# Patient Record
Sex: Female | Born: 1975
Health system: Southern US, Community
[De-identification: ages and names within clinical notes are randomized; demographics above are authoritative.]

## PROBLEM LIST (undated history)

## (undated) DIAGNOSIS — E11319 Type 2 diabetes mellitus with unspecified diabetic retinopathy without macular edema: Secondary | ICD-10-CM

## (undated) DIAGNOSIS — R809 Proteinuria, unspecified: Secondary | ICD-10-CM

## (undated) DIAGNOSIS — G4733 Obstructive sleep apnea (adult) (pediatric): Secondary | ICD-10-CM

## (undated) DIAGNOSIS — E119 Type 2 diabetes mellitus without complications: Secondary | ICD-10-CM

## (undated) DIAGNOSIS — D649 Anemia, unspecified: Secondary | ICD-10-CM

## (undated) DIAGNOSIS — N920 Excessive and frequent menstruation with regular cycle: Secondary | ICD-10-CM

## (undated) DIAGNOSIS — M5414 Radiculopathy, thoracic region: Secondary | ICD-10-CM

## (undated) DIAGNOSIS — E118 Type 2 diabetes mellitus with unspecified complications: Secondary | ICD-10-CM

## (undated) DIAGNOSIS — I2510 Atherosclerotic heart disease of native coronary artery without angina pectoris: Secondary | ICD-10-CM

## (undated) DIAGNOSIS — G8929 Other chronic pain: Secondary | ICD-10-CM

## (undated) DIAGNOSIS — I10 Essential (primary) hypertension: Secondary | ICD-10-CM

## (undated) HISTORY — DX: Type 2 diabetes mellitus without complications: E11.9

## (undated) HISTORY — DX: Excessive and frequent menstruation with regular cycle: N92.0

## (undated) HISTORY — PX: ABLATION COLPOCLESIS: SHX1118

## (undated) HISTORY — DX: Type 2 diabetes mellitus with unspecified diabetic retinopathy without macular edema: E11.319

## (undated) HISTORY — DX: Atherosclerotic heart disease of native coronary artery without angina pectoris: I25.10

## (undated) HISTORY — PX: ECTOPIC PREGNANCY SURGERY: SHX613

## (undated) HISTORY — DX: Obstructive sleep apnea (adult) (pediatric): G47.33

## (undated) HISTORY — DX: Proteinuria, unspecified: R80.9

## (undated) HISTORY — DX: Morbid (severe) obesity due to excess calories: E66.01

## (undated) HISTORY — DX: Anemia, unspecified: D64.9

## (undated) HISTORY — PX: OTHER SURGICAL HISTORY: SHX169

## (undated) HISTORY — PX: CLEFT PALATE REPAIR: SUR1165

## (undated) HISTORY — PX: ABDOMINAL HYSTERECTOMY: SHX81

## (undated) HISTORY — DX: Essential (primary) hypertension: I10

## (undated) HISTORY — DX: Type 2 diabetes mellitus with unspecified complications: E11.8

---

## 2000-09-30 HISTORY — PX: OOPHORECTOMY: SHX86

## 2009-02-21 ENCOUNTER — Emergency Department (HOSPITAL_BASED_OUTPATIENT_CLINIC_OR_DEPARTMENT_OTHER): Admission: EM | Admit: 2009-02-21 | Discharge: 2009-02-21 | Payer: Self-pay | Admitting: Emergency Medicine

## 2009-05-31 LAB — CONVERTED CEMR LAB: Pap Smear: NORMAL

## 2009-10-26 ENCOUNTER — Emergency Department (HOSPITAL_BASED_OUTPATIENT_CLINIC_OR_DEPARTMENT_OTHER): Admission: EM | Admit: 2009-10-26 | Discharge: 2009-10-26 | Payer: Self-pay | Admitting: Emergency Medicine

## 2009-10-31 ENCOUNTER — Encounter: Payer: Self-pay | Admitting: Cardiology

## 2009-10-31 ENCOUNTER — Ambulatory Visit: Payer: Self-pay | Admitting: Family

## 2009-10-31 ENCOUNTER — Telehealth: Payer: Self-pay | Admitting: Family

## 2009-10-31 DIAGNOSIS — F17200 Nicotine dependence, unspecified, uncomplicated: Secondary | ICD-10-CM | POA: Insufficient documentation

## 2009-10-31 DIAGNOSIS — I10 Essential (primary) hypertension: Secondary | ICD-10-CM | POA: Insufficient documentation

## 2009-11-01 ENCOUNTER — Ambulatory Visit: Payer: Self-pay | Admitting: Family

## 2009-11-01 LAB — CONVERTED CEMR LAB
Basophils Absolute: 0 10*3/uL (ref 0.0–0.1)
CO2: 26 meq/L (ref 19–32)
Creatinine, Ser: 0.55 mg/dL (ref 0.40–1.20)
Eosinophils Absolute: 0.5 10*3/uL (ref 0.0–0.7)
Glucose, Bld: 168 mg/dL — ABNORMAL HIGH (ref 70–99)
HCT: 40.2 % (ref 36.0–46.0)
HDL: 33 mg/dL — ABNORMAL LOW (ref >39)
Hemoglobin: 11.4 g/dL — ABNORMAL LOW (ref 12.0–15.0)
Indirect Bilirubin: 0.4 mg/dL (ref 0.0–0.9)
Monocytes Relative: 6 % (ref 3–12)
Neutro Abs: 5.6 10*3/uL (ref 1.7–7.7)
Neutrophils Relative %: 55 % (ref 43–77)
RDW: 17.4 % — ABNORMAL HIGH (ref 11.5–15.5)
TSH: 1.037 microintl units/mL (ref 0.350–4.500)
Total Bilirubin: 0.5 mg/dL (ref 0.3–1.2)
Triglycerides: 88 mg/dL (ref <150)
WBC: 10.3 10*3/uL (ref 4.0–10.5)

## 2009-11-03 ENCOUNTER — Encounter: Payer: Self-pay | Admitting: Family

## 2009-11-03 ENCOUNTER — Telehealth (INDEPENDENT_AMBULATORY_CARE_PROVIDER_SITE_OTHER): Payer: Self-pay | Admitting: *Deleted

## 2009-11-03 DIAGNOSIS — Z794 Long term (current) use of insulin: Secondary | ICD-10-CM | POA: Insufficient documentation

## 2009-11-03 DIAGNOSIS — E118 Type 2 diabetes mellitus with unspecified complications: Secondary | ICD-10-CM | POA: Insufficient documentation

## 2009-11-03 DIAGNOSIS — D649 Anemia, unspecified: Secondary | ICD-10-CM | POA: Insufficient documentation

## 2009-11-03 DIAGNOSIS — IMO0002 Reserved for concepts with insufficient information to code with codable children: Secondary | ICD-10-CM | POA: Insufficient documentation

## 2009-11-03 DIAGNOSIS — E1165 Type 2 diabetes mellitus with hyperglycemia: Secondary | ICD-10-CM

## 2009-11-03 LAB — CONVERTED CEMR LAB: Hgb A1c MFr Bld: 9.1 % — ABNORMAL HIGH (ref 4.6–6.1)

## 2009-11-10 ENCOUNTER — Telehealth (INDEPENDENT_AMBULATORY_CARE_PROVIDER_SITE_OTHER): Payer: Self-pay | Admitting: *Deleted

## 2009-11-13 ENCOUNTER — Telehealth: Payer: Self-pay | Admitting: Family

## 2009-11-15 ENCOUNTER — Ambulatory Visit: Payer: Self-pay | Admitting: Family

## 2009-11-15 DIAGNOSIS — N920 Excessive and frequent menstruation with regular cycle: Secondary | ICD-10-CM | POA: Insufficient documentation

## 2009-11-15 DIAGNOSIS — B359 Dermatophytosis, unspecified: Secondary | ICD-10-CM | POA: Insufficient documentation

## 2009-11-15 LAB — CONVERTED CEMR LAB
Creatinine, Urine: 232.7 mg/dL
Microalb Creat Ratio: 98.4 mg/g — ABNORMAL HIGH (ref 0.0–30.0)
Microalb, Ur: 22.89 mg/dL — ABNORMAL HIGH (ref 0.00–1.89)
Saturation Ratios: 5 % — ABNORMAL LOW (ref 20–55)

## 2009-11-16 ENCOUNTER — Telehealth: Payer: Self-pay | Admitting: Family

## 2009-11-16 DIAGNOSIS — R809 Proteinuria, unspecified: Secondary | ICD-10-CM | POA: Insufficient documentation

## 2009-11-29 ENCOUNTER — Encounter: Payer: Self-pay | Admitting: Cardiology

## 2009-11-29 ENCOUNTER — Ambulatory Visit: Payer: Self-pay | Admitting: Cardiology

## 2009-11-29 DIAGNOSIS — R0602 Shortness of breath: Secondary | ICD-10-CM | POA: Insufficient documentation

## 2010-09-24 ENCOUNTER — Emergency Department (HOSPITAL_BASED_OUTPATIENT_CLINIC_OR_DEPARTMENT_OTHER)
Admission: EM | Admit: 2010-09-24 | Discharge: 2010-09-24 | Payer: Self-pay | Source: Home / Self Care | Admitting: Emergency Medicine

## 2010-10-30 NOTE — Progress Notes (Signed)
Summary: CBG questioins and Appt question?  Phone Note Call from Patient   Caller: Patient Call For: Lemont Fillers FNP Summary of Call: Pt. called and stated that she has a appointment on Wed. this week and would like to come then and give her CBG readings to [facility]. Is that okay?  Call patient back at [phone]. Her readings have been running 150-190.  Initial call taken by: Michaelle Copas,  November 13, 2009 1:18 PM  Follow-up for Phone Call        Yes- please instruct patient keep apt. and bring her CBG info. Follow-up by: Lemont Fillers FNP,  November 13, 2009 1:40 PM  Additional Follow-up for Phone Call Additional follow up Details #1::        informed patient to keep her appt. and to bring CBD readings with her.Michaelle Copas  November 13, 2009 2:34 PM

## 2010-10-30 NOTE — Progress Notes (Signed)
Summary: discuss labs  Phone Note Outgoing Call   Summary of Call: Please call patient and let her know that her sugar is high.  Please have her come in on Monday if possible for a visit.  Could we please send a prescription to pharmacy for glucose meter, 100 test strips and lancets.  She should bring these with her to her appointment.Thanks Initial call taken by: Lemont Fillers FNP,  November 03, 2009 3:50 PM  Follow-up for Phone Call        left message on machine ........Marland KitchenDoristine Devoid  November 06, 2009 3:45 PM   spoke w/ patient will pick up glucometer and prescription for lancets and strips also to schedule appt for monday at that time........Marland KitchenDoristine Devoid  November 06, 2009 4:02 PM   New Problems: ANEMIA (ICD-285.9) DM (ICD-250.00)   New Problems: ANEMIA (ICD-285.9) DM (ICD-250.00) New/Updated Medications: ACCU-CHEK AVIVA  STRP (GLUCOSE BLOOD) test two times a day ACCU-CHEK MULTICLIX LANCETS  MISC (LANCETS) test two times a day Prescriptions: ACCU-CHEK MULTICLIX LANCETS  MISC (LANCETS) test two times a day  #100 x 3   Entered by:   Doristine Devoid   Authorized by:   Lemont Fillers FNP   Signed by:   Doristine Devoid on 11/06/2009   Method used:   Print then Give to Patient   RxID:   [id number] ACCU-CHEK AVIVA  STRP (GLUCOSE BLOOD) test two times a day  #100 x 3   Entered by:   Doristine Devoid   Authorized by:   Lemont Fillers FNP   Signed by:   Doristine Devoid on 11/06/2009   Method used:   Print then Give to Patient   RxID:   [id number]

## 2010-10-30 NOTE — Assessment & Plan Note (Signed)
Summary: [facility]   Visit Type:  Initial Consult  CC:  Morbid obesity. Cardiology consult.  History of Present Illness: 36 yo female for evaluation of multiple risk factors. The patient has dyspnea with routine activities. It is relieved with rest. It is not associated with chest pain. There is no orthopnea or PND but there is occasional mild pedal edema. She has not had exertional chest pain. Note she does not exercise routinely and does smoke. She has been recently diagnosed with diabetes. We were asked to evaluate for risk factors and for her dyspnea.  Current Medications (verified): 1)  Clindamycin Phosphate 1 % Soln (Clindamycin Phosphate) .... Use Daily 2)  Minocycline Hcl 100 Mg Caps (Minocycline Hcl) .... Take One Tablet Two Times A Day 3)  Hydrochlorothiazide 25 Mg Tabs (Hydrochlorothiazide) .... Take One Tablet Daily 4)  Accu-Chek Aviva  Strp (Glucose Blood) .... Test Two Times A Day 5)  Accu-Chek Multiclix Lancets  Misc (Lancets) .... Test Two Times A Day 6)  Metformin Hcl 500 Mg Tabs (Metformin Hcl) .... One Tablet By Mouth Two Times A Day With Meals X 1 Week, Then 2 Tabs in Am 1 Tab in Pm X 1 Week, Then Two Tabs By Mouth Two Times A Day 7)  Ferrous Sulfate 325 (65 Fe) Mg Tabs (Ferrous Sulfate) .... One Tablet By Mouth Bid 8)  Labetalol Hcl 100 Mg Tabs (Labetalol Hcl) .... One Tablet By Mouth Two Times A Day 9)  Multivitamins  Tabs (Multiple Vitamin) .... Take 1 Tablet By Mouth Once A Day  Allergies: 1)  ! Ibuprofen  Past History:  Past Medical History: Current Problems:  HYPERTENSION (ICD-401.9) PROTEINURIA (ICD-791.0) MENORRHAGIA (ICD-626.2) ANEMIA (ICD-285.9) DM (ICD-250.00) MORBID OBESITY (ICD-278.01 Uterine Fibroids  Past Surgical History: Reviewed history from 10/31/2009 and no changes required. Caesarean section Ectopic pregnancy x2 Cyst removal Cleft Palate  Family History: Reviewed history from 10/31/2009 and no changes  required. CAD-maternal aunt deceased MI 47 HTN-no DM-no STROKE-no COLON CA-no BREAST CA-no  Mom- DM2- (s/p pancreatectomy) had gunshot wound Dad- oral cancer, tobacco abuse Brother- alive and well  Social History: Reviewed history from 10/31/2009 and no changes required. married- filling for divorce 28 year old daughter works as Engineer, materials 1/2 PPD smoking denies illicit drug use occasional ETOH  Review of Systems       Problems with back pain but no fevers or chills, productive cough, hemoptysis, dysphasia, odynophagia, melena, hematochezia, dysuria, hematuria, rash, seizure activity, orthopnea, PND,  claudication. Remaining systems are negative.   Vital Signs:  Patient profile:   35 year old female Menstrual status:  irregular-fibroids Height:      66 inches Weight:      336 pounds BMI:     54.43 Pulse rate:   76 / minute Pulse rhythm:   regular Resp:     18 per minute BP sitting:   120 / 80  (left arm) Cuff size:   large  Vitals Entered By: Vikki Ports (November 29, 2009 10:00 AM)  Physical Exam  General:  Well developed/morbidly obese in NAD Skin warm/dry; tatoo Patient not depressed No peripheral clubbing Back-normal HEENT-normal/normal eyelids Neck supple/normal carotid upstroke bilaterally; no bruits; no JVD; no thyromegaly chest - CTA/ normal expansion CV - RRR/normal S1 and S2; no murmurs, rubs or gallops; no rub; PMI nondisplaced Abdomen -NT/ND, no HSM, no mass, + bowel sounds, no bruit 2+ femoral pulses, no bruits Ext-no edema, chords, 2+ DP Neuro-grossly nonfocal     Impression & Recommendations:  Problem # 1:  DYSPNEA (ICD-786.05) This is most likely related to obesity hypoventilation syndrome and possible sleep apnea. There is also a component of deconditioning. Given multiple risk factors I will schedule a stress echocardiogram to exclude ischemia and also to quantitate LV function. She is also contemplating beginning an exercise program  and this will help risk stratify prior to beginning. Her updated medication list for this problem includes:    Hydrochlorothiazide 25 Mg Tabs (Hydrochlorothiazide) .Marland Kitchen... Take one tablet daily    Labetalol Hcl 100 Mg Tabs (Labetalol hcl) ..... One tablet by mouth two times a day  Problem # 2:  HYPERTENSION (ICD-401.9) Blood pressure controlled on present medications. Will continue. Her updated medication list for this problem includes:    Hydrochlorothiazide 25 Mg Tabs (Hydrochlorothiazide) .Marland Kitchen... Take one tablet daily    Labetalol Hcl 100 Mg Tabs (Labetalol hcl) ..... One tablet by mouth two times a day  Orders: Stress Echo (Stress Echo)  Problem # 3:  DM (ICD-250.00) Management per primary care. Her updated medication list for this problem includes:    Metformin Hcl 500 Mg Tabs (Metformin hcl) ..... One tablet by mouth two times a day with meals x 1 week, then 2 tabs in am 1 tab in pm x 1 week, then two tabs by mouth two times a day  Problem # 4:  TOBACCO ABUSE (ICD-305.1) Patient counseled on discontinuing her between 3-10 minutes.  Problem # 5:  MORBID OBESITY (ICD-278.01) Long discussions concerning exercise, diet and other lifestyle modification in the office.  Patient Instructions: 1)  Your physician recommends that you schedule a follow-up appointment in: AS NEEDED PENDING TEST RESULTS 2)  Your physician has requested that you have a stress echocardiogram. For further information please visit [email or web address]  Please follow instruction sheet as given.

## 2010-10-30 NOTE — Progress Notes (Signed)
Summary: faxed request to [facility] for records  Phone Note Outgoing Call   Call placed by: marj Call placed to: [facility]  Summary of Call: faxed request to [facility] for patients records  Initial call taken by: Roselle Locus,  October 31, 2009 4:31 PM

## 2010-10-30 NOTE — Progress Notes (Signed)
  Phone Note Outgoing Call   Summary of Call: Please call patient and let her know that I would like to add labetalol to her med regimen for her pressure.  There is some protein in her urine and  I want to get her BP in tight control. She should f/u in 1 month please for OV. Initial call taken by: Lemont Fillers FNP,  November 16, 2009 3:30 PM  Follow-up for Phone Call        discussed with pt Catherine Espinoza  November 16, 2009 3:43 PM   New Problems: PROTEINURIA (ICD-791.0)   New Problems: PROTEINURIA (ICD-791.0) New/Updated Medications: LABETALOL HCL 100 MG TABS (LABETALOL HCL) one tablet by mouth two times a day Prescriptions: LABETALOL HCL 100 MG TABS (LABETALOL HCL) one tablet by mouth two times a day  #601 x 0   Entered and Authorized by:   Lemont Fillers FNP   Signed by:   Lemont Fillers FNP on 11/16/2009   Method used:   Electronically to        [facility] [phone]* (retail)       [address]       [place], [place]  [address]       Ph: [phone]       Fax: [phone]   RxID:   [id number]

## 2010-10-30 NOTE — Progress Notes (Signed)
Summary: refill request   Phone Note Refill Request Message from:  Fax from Pharmacy on November 10, 2009 8:15 AM  Refills Requested: Medication #1:  ACCU-CHEK AVIVA  STRP test two times a day   Dosage confirmed as above?Dosage Confirmed   Brand Name Necessary? No   Supply Requested: 1 month [facility] [address] [place] [address]   Method Requested: Electronic Next Appointment Scheduled: 11-15-2009 cardiology  Initial call taken by: Roselle Locus,  November 10, 2009 8:16 AM  Follow-up for Phone Call        prescription done and given to patient  11/03/09.Marland KitchenDoristine Devoid  November 10, 2009 2:12 PM

## 2010-10-30 NOTE — Assessment & Plan Note (Signed)
Summary: cpx/mhf   Vital Signs:  Patient profile:   35 year old female Menstrual status:  irregular-fibroids LMP:     11/15/2009 Height:      66 inches Weight:      347.08 pounds Temp:     98.3 degrees F oral Pulse rate:   100 / minute Resp:     18 per minute BP sitting:   136 / 80  (right arm)  Vitals Entered By: Catherine Espinoza CC: physical; Wants something to help with vaginal bleeding LMP (date): 11/15/2009 LMP - Character: heavy LMP - Reliable? No     Enter LMP: 11/15/2009 Last PAP Result normal   CC:  physical; Wants something to help with vaginal bleeding.  History of Present Illness: Catherine Espinoza is a 35 year old female who presents today for a complete physical  DM2- new diagnosis- notes CBG's running mid 150's in general, took in the evening once and it was 219.  Heavy menstrual bleeding- Has not stopped bleeding since November.  Notes that she has tried provera in the past which helped temporarily- but bleeding came back as soon as she stopped.   Notes that she has fibroids, had unsuccessful attempt to insert IUD.  Was told she needs hysterectomy, but can't afford deductable.    Preventative-   + exercise 3 x a week.   Last pap september 2010- normal  Preventive Screening-Counseling & Management  Alcohol-Tobacco     Smoking Cessation Counseling: yes  Allergies: 1)  ! Ibuprofen  Review of Systems       Constitutional: Denies Fever ENT:  Denies nasal congestion or sore throat. Resp: Denies cough CV:  Denies Chest Pain GI:  Denies nausea or vomitting GU: Denies dysuria Lymphatic: Denies lymphadenopathy Musculoskeletal:  Denies muscle/joint pain Skin:  + rash on face Psychiatric: Denies depression or anxiet Neuro: Denies numbness     Physical Exam  General:  morbidly obese AA female, appears older than stated age Head:  Normocephalic and atraumatic without obvious abnormalities. No apparent alopecia or balding. Eyes:  PERRLA Ears:  External  ear exam shows no significant lesions or deformities.  Otoscopic examination reveals clear canals, tympanic membranes are intact bilaterally without bulging, retraction, inflammation or discharge. Hearing is grossly normal bilaterally. Mouth:  Oral mucosa and oropharynx without lesions or exudates.  Teeth in good repair. Neck:  No deformities, masses, or tenderness noted. Breasts:  declined- done by GYN Lungs:  Normal respiratory effort, chest expands symmetrically. Lungs are clear to auscultation, no crackles or wheezes. Heart:  Normal rate and regular rhythm. S1 and S2 normal without gallop, murmur, click, rub or other extra sounds. Abdomen:  Bowel sounds positive,abdomen soft and non-tender without masses, organomegaly or hernias noted. Msk:  No deformity or scoliosis noted of thoracic or lumbar spine.   Extremities:  No clubbing, cyanosis, edema, or deformity noted with normal full range of motion of all joints.   Neurologic:  alert & oriented X3, cranial nerves II-XII intact, strength normal in all extremities, and gait normal.   Skin:  +annular lesion noted at bottom of right cheek 1 cm in diameter Cervical Nodes:  No lymphadenopathy noted Psych:  Cognition and judgment appear intact. Alert and cooperative with normal attention span and concentration. No apparent delusions, illusions, hallucinations   Impression & Recommendations:  Problem # 1:  HEALTH MAINTENANCE EXAM (ICD-V70.0) Up to date on PAP, patient counselled on diabetic diet, weight loss.  Immunizations reviewed.  Date of last tetanus shot unknown, need to  try to obtain old records.    Problem # 2:  DM (ICD-250.00) Assessment: New  Will initiate diet, exercise and metformin given A1C is 9.1.  I did advise patient to use condoms for birth control as metformin may increase her fertility. Patient has appointment with nutrition.  Will also refer to podiatry for annual foot exam and to opthalmology for annual dilated eye exam. Check  urine for microalbumin.   Her updated medication list for this problem includes:    Metformin Hcl 500 Mg Tabs (Metformin hcl) ..... One tablet by mouth two times a day with meals x 1 week, then 2 tabs in am 1 tab in pm x 1 week, then two tabs by mouth two times a day  Orders: T-Urine Microalbumin w/creat. ratio [phone]) Ophthalmology Referral (Ophthalmology) Podiatry Referral (Podiatry)  Labs Reviewed: Creat: 0.55 (11/01/2009)    Reviewed HgBA1c results: 9.1 (11/03/2009)  Problem # 3:  MENORRHAGIA (ICD-626.2) Will refer to another GYN for second opinion.  I am concerned about the increased risk of endometrial cancer due to morbid obesity.  ? need for uterine biopsy- will defer to GYN.   Orders: Gynecologic Referral (Gyn)  Problem # 4:  HYPERTENSION (ICD-401.9) Assessment: Improved  Her updated medication list for this problem includes:    Hydrochlorothiazide 25 Mg Tabs (Hydrochlorothiazide) .Marland Kitchen... Take one tablet daily  BP today: 136/80 Prior BP: 142/96 (10/31/2009)  Labs Reviewed: K+: 4.1 (11/01/2009) Creat: : 0.55 (11/01/2009)   Chol: 170 (11/01/2009)   HDL: 33 (11/01/2009)   LDL: 119 (11/01/2009)   TG: 88 (11/01/2009)  Problem # 5:  MORBID OBESITY (ICD-278.01) Assessment: Comment Only For nutritionist referral  Problem # 6:  ANEMIA (ICD-285.9) Assessment: Comment Only microcytic anemia, likely due to menstrual loss.   Will check iron studies, add by mouth iron.  Orders: Iron Binding Cap (TIBC)-[facility] ([id number]) T-Iron [phone]) Ferritin-[facility] [phone])  Her updated medication list for this problem includes:    Ferrous Sulfate 325 (65 Fe) Mg Tabs (Ferrous sulfate) ..... One tablet by mouth bid  Complete Medication List: 1)  Clindamycin Phosphate 1 % Soln (Clindamycin phosphate) .... Use daily 2)  Minocycline Hcl 100 Mg Caps (Minocycline hcl) .... Take one tablet two times a day 3)  Hydrochlorothiazide 25 Mg Tabs (Hydrochlorothiazide) .... Take one  tablet daily 4)  Accu-chek Aviva Strp (Glucose blood) .... Test two times a day 5)  Accu-chek Multiclix Lancets Misc (Lancets) .... Test two times a day 6)  Metformin Hcl 500 Mg Tabs (Metformin hcl) .... One tablet by mouth two times a day with meals x 1 week, then 2 tabs in am 1 tab in pm x 1 week, then two tabs by mouth two times a day 7)  Ferrous Sulfate 325 (65 Fe) Mg Tabs (Ferrous sulfate) .... One tablet by mouth bid  Patient Instructions: 1)  Tobacco is very bad for your health and your loved ones! You Should stop smoking!. 2)  Stop Smoking Tips: Choose a Quit date. Cut down before the Quit date. decide what you will do as a substitute when you feel the urge to smoke(gum,toothpick,exercise). 3)  It is important that you exercise regularly at least 20 minutes 5 times a week. If you develop chest pain, have severe difficulty breathing, or feel very tired , stop exercising immediately and seek medical attention. 4)  You need to lose weight. Consider a lower calorie diet and regular exercise.  5)  Please schedule a follow-up appointment in 1 month. Prescriptions: METFORMIN HCL 500 MG  TABS (METFORMIN HCL) one tablet by mouth two times a day with meals x 1 week, then 2 tabs in AM 1 tab in PM x 1 week, then two tabs by mouth two times a day  #120 x 0   Entered and Authorized by:   Lemont Fillers FNP   Signed by:   Lemont Fillers FNP on 11/15/2009   Method used:   Electronically to        [facility] [phone]* (retail)       [address]       [place], [place]  [address]       Ph: [phone]       Fax: [phone]   RxID:   [id number]   Preventive Care Screening  Last Flu Shot:    Date:  05/31/2009    Results:  given   Pap Smear:    Date:  05/31/2009    Results:  normal   PPD:    Date:  09/30/2008    Results:  negative   Last Pneumovax:    Date:  10/01/2007    Results:  given    Appended Document: cpx/mhf     Allergies: 1)  !  Ibuprofen   Impression & Recommendations:  Problem # 1:  RINGWORM (ICD-110.9) Assessment New noted on cheek, patient instructed to apply OTC lotrimin.    Complete Medication List: 1)  Clindamycin Phosphate 1 % Soln (Clindamycin phosphate) .... Use daily 2)  Minocycline Hcl 100 Mg Caps (Minocycline hcl) .... Take one tablet two times a day 3)  Hydrochlorothiazide 25 Mg Tabs (Hydrochlorothiazide) .... Take one tablet daily 4)  Accu-chek Aviva Strp (Glucose blood) .... Test two times a day 5)  Accu-chek Multiclix Lancets Misc (Lancets) .... Test two times a day 6)  Metformin Hcl 500 Mg Tabs (Metformin hcl) .... One tablet by mouth two times a day with meals x 1 week, then 2 tabs in am 1 tab in pm x 1 week, then two tabs by mouth two times a day 7)  Ferrous Sulfate 325 (65 Fe) Mg Tabs (Ferrous sulfate) .... One tablet by mouth bid

## 2010-10-30 NOTE — Assessment & Plan Note (Signed)
Summary: new to be est- jr seen in our E.R. for hypertension   Vital Signs:  Patient profile:   35 year old female Menstrual status:  irregular-fibroids Height:      66.50 inches Weight:      350 pounds BMI:     55.85 Pulse rate:   86 / minute BP sitting:   142 / 96  (right arm)  Vitals Entered By: Doristine Devoid (October 31, 2009 3:44 PM) CC: NEW EST- was seen in er for elevated bp and legs swellen LMP - Character: heavy LMP - Reliable? No     Menstrual Status irregular-fibroids   CC:  NEW EST- was seen in er for elevated bp and legs swellen.  History of Present Illness: Ms Clover is a 35 year old female who presents today to establish care.  She presents following a visit to the emergency room.  She presented to the ED on 10/26/09 due to LE edema.  She was noted to have a BP of 149/110.  She was prescribed HCTZ- she is tolerating this medicine well and notes that her lower extremity elema is improving.  Notes that she develops worsening edema when her legs are dependent.    Notes that she was going to [facility] clinic previously as she was uninsured.  Now she has health insurance and wishes to establish with a primary care.  Preventive Screening-Counseling & Management  Alcohol-Tobacco     Smoking Cessation Counseling: yes  Allergies (verified): 1)  ! Ibuprofen  Past History:  Past Medical History: Ance Hypertension Obesity Uterine Fibroids  Past Surgical History: Caesarean section Ectopic pregnancy x2 Cyst removal Cleft Palate  Family History: CAD-maternal aunt deceased MI 86 HTN-no DM-no STROKE-no COLON CA-no BREAST CA-no  Mom- DM2- (s/p pancreatectomy) had gunshot wound Dad- oral cancer, tobacco abuse Brother- alive and well  Social History: married- filling for divorce 2 year old daughter works as Engineer, materials 1/2 PPD smoking denies illicit drug use occasional ETOH  Review of Systems       Denies chest pain,  +DOE- which she  attributes to inactivity  Physical Exam  General:  morbidly obese AA female, appears older than stated age Head:  Normocephalic and atraumatic without obvious abnormalities. No apparent alopecia or balding. Neck:  thick, no palpable masses Lungs:  Normal respiratory effort, chest expands symmetrically. Lungs are clear to auscultation, no crackles or wheezes. Heart:  Normal rate and regular rhythm. S1 and S2 normal without gallop, murmur, click, rub or other extra sounds. Extremities:  2-3+ bilateral lower extremity edema noted up to knees. Skin:  acanthosis nigricans Psych:  Cognition and judgment appear intact. Alert and cooperative with normal attention span and concentration. No apparent delusions, illusions, hallucinations   Impression & Recommendations:  Problem # 1:  HYPERTENSION (ICD-401.9) Assessment New  BP is still up,  but has only been on med for a few days- plan to have patient return in 2 weeks for BMET, BP f/u and CPX.  Will add second agent at time if no improvement.  I am concerned RE: LE edema and DOE about underlying CHF- will refer for 2-D echo.  Unfortunately,  this study may be limited by habitus- but will see.  EKG performed her in office nots HR 112, sinus rythm and nospecific T wave abnormality.  I note T wave flattening in V2, inverted T wave lead V3/V4- will also send for stress test.      Hydrochlorothiazide 25 Mg Tabs (Hydrochlorothiazide) .Marland KitchenMarland KitchenMarland KitchenMarland Kitchen  Take one tablet daily  BP today: 142/96  Orders: Misc. Referral (Misc. Ref)  Problem # 2:  MORBID OBESITY (ICD-278.01) Assessment: Comment Only Tells me she is motivated to lose weight but doesn't know how to diet.  Will plan referral to nutrition. Orders: Nutrition Referral (Nutrition) Misc. Referral (Misc. Ref)  Problem # 3:  TOBACCO ABUSE (ICD-305.1) Assessment: Comment Only  Patient counselled on smoking cessation  Orders: Tobacco use cessation intermediate 3-10 minutes (99406)  Complete Medication  List: 1)  Clindamycin Phosphate 1 % Soln (Clindamycin phosphate) .... Use daily 2)  Minocycline Hcl 100 Mg Caps (Minocycline hcl) .... Take one tablet two times a day 3)  Hydrochlorothiazide 25 Mg Tabs (Hydrochlorothiazide) .... Take one tablet daily  Patient Instructions: 1)  Tobacco is very bad for your health and your loved ones! You Should stop smoking!. 2)  You need to lose weight. Consider a lower calorie diet and regular exercise.  3)  Limit your Sodium (Salt). 4)  You will be called about your echocardiogram 5)  Please follow up in 1 week fasting for the following labs: 6)  CBC, BMET, FLP, LFT's, TSH (v70) 7)  Follow up in 2 weeks for a complete physical.   Prescriptions: HYDROCHLOROTHIAZIDE 25 MG TABS (HYDROCHLOROTHIAZIDE) take one tablet daily  #30 x 1   Entered and Authorized by:   Lemont Fillers FNP   Signed by:   Lemont Fillers FNP on 10/31/2009   Method used:   Electronically to        [facility] [phone]* (retail)       [address]       [place], [place]  [address]       Ph: [phone]       Fax: [phone]   RxID:   [id number]

## 2011-01-14 ENCOUNTER — Other Ambulatory Visit: Payer: Self-pay | Admitting: Family

## 2011-01-14 ENCOUNTER — Telehealth: Payer: Self-pay | Admitting: *Deleted

## 2011-01-14 ENCOUNTER — Ambulatory Visit (INDEPENDENT_AMBULATORY_CARE_PROVIDER_SITE_OTHER): Payer: BC Managed Care – PPO | Admitting: Family

## 2011-01-14 ENCOUNTER — Encounter: Payer: Self-pay | Admitting: Family

## 2011-01-14 VITALS — BP 140/92 | HR 84 | Temp 97.9°F | Resp 18 | Ht 65.98 in | Wt 337.0 lb

## 2011-01-14 DIAGNOSIS — Z0000 Encounter for general adult medical examination without abnormal findings: Secondary | ICD-10-CM

## 2011-01-14 DIAGNOSIS — R0609 Other forms of dyspnea: Secondary | ICD-10-CM

## 2011-01-14 DIAGNOSIS — K219 Gastro-esophageal reflux disease without esophagitis: Secondary | ICD-10-CM

## 2011-01-14 DIAGNOSIS — R0602 Shortness of breath: Secondary | ICD-10-CM

## 2011-01-14 DIAGNOSIS — N920 Excessive and frequent menstruation with regular cycle: Secondary | ICD-10-CM

## 2011-01-14 DIAGNOSIS — R0989 Other specified symptoms and signs involving the circulatory and respiratory systems: Secondary | ICD-10-CM

## 2011-01-14 DIAGNOSIS — R0683 Snoring: Secondary | ICD-10-CM

## 2011-01-14 DIAGNOSIS — R350 Frequency of micturition: Secondary | ICD-10-CM

## 2011-01-14 DIAGNOSIS — E119 Type 2 diabetes mellitus without complications: Secondary | ICD-10-CM

## 2011-01-14 MED ORDER — HYDROCHLOROTHIAZIDE 25 MG PO TABS: 25.0000 mg | ORAL_TABLET | Freq: Every day | ORAL | Status: DC

## 2011-01-14 MED ORDER — GLUCOSE BLOOD VI STRP: ORAL_STRIP | Status: DC

## 2011-01-14 MED ORDER — LABETALOL HCL 100 MG PO TABS: 100.0000 mg | ORAL_TABLET | Freq: Two times a day (BID) | ORAL | Status: DC

## 2011-01-14 MED ORDER — ESOMEPRAZOLE MAGNESIUM 40 MG PO CPDR: 40.0000 mg | DELAYED_RELEASE_CAPSULE | Freq: Every day | ORAL | Status: DC

## 2011-01-14 MED ORDER — METFORMIN HCL 500 MG PO TABS: 500.0000 mg | ORAL_TABLET | Freq: Two times a day (BID) | ORAL | Status: DC

## 2011-01-14 NOTE — Patient Instructions (Addendum)
You will be contacted about your referral to GYN,  follow up with Dr. Jens Som (cardiology), and referral to the Sleep clinic. Follow up in 1 month.

## 2011-01-14 NOTE — Assessment & Plan Note (Signed)
Pt [facility] in [facility] past.  She wishes to return to their practice.  Now having irregular but heavy menses.

## 2011-01-14 NOTE — Progress Notes (Signed)
  Subjective:    Patient ID: Catherine Espinoza, female    DOB: Aug 30, 1976, 35 y.o.   MRN: [id number]  HPI  1.  GERD- tried prevacid OTC without improvement. Sleeping up on several pillows.  Some improvement with her mother's nexium.  2.  Irregular menstrual bleeding- 2-3 months without period. Saw Dr. Alyce Pagan in [facility] and was advised against surgery.  Bleeding is very heavy.  She was seen by a physician at [facility].   3.  DM- randomly checked BS 1 weeks ago- 250.  Not on meds x greater than 6 months.  4.  Weight loss- not currently exercising. Eats out a lot.    5.  Sleep apnea-  + snoring.  Feels rested during the day.  + increased SOB.  No family history of sleep apnea.   6.  Stress incontinence and urgency- wears a pad (dribbles).   7.  Tobacco abuse- smokes 1 PPD, interested in medication to help her to quit smoking.   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       Review of Systems  Constitutional: Negative for fever and chills.  Respiratory: Positive for shortness of breath. Negative for cough and chest tightness.   Cardiovascular: Negative for chest pain.  Genitourinary: Positive for urgency and frequency.   No past medical history on file.  History   Social History  . Marital Status: Single    Spouse Name: N/A    Number of Children: N/A  . Years of Education: N/A   Occupational History  . Not on file.   Social History Main Topics  . Smoking status: Current Everyday Smoker -- 1.0 packs/day for 10 years    Types: Cigarettes  . Smokeless tobacco: Never Used  . Alcohol Use: Not on file  . Drug Use: Not on file  . Sexually Active: Not on file   Other Topics Concern  . Not on file   Social History Narrative  . No narrative on file    No past surgical history on file.  No family history on file.  Allergies  Allergen Reactions  . Ibuprofen     REACTION: knots in mouth    No current outpatient prescriptions on file prior to visit.     BP 140/92  Pulse 84  Temp(Src) 97.9 F (36.6 C) (Oral)  Resp 18  Ht 5' 5.98" (1.676 m)  Wt 337 lb 0.6 oz (152.88 kg)  BMI 54.43 kg/m2  LMP 12/12/2010       Objective:   Physical Exam  Constitutional: She appears well-developed and well-nourished.  HENT:  Head: Normocephalic and atraumatic.  Eyes: Conjunctivae are normal. Pupils are equal, round, and reactive to light.  Neck: Normal range of motion. Neck supple.  Cardiovascular: Normal rate and regular rhythm.   Pulmonary/Chest: Effort normal and breath sounds normal.  Abdominal: Soft. Bowel sounds are normal.  Psychiatric: She has a normal mood and affect.          Assessment & Plan:

## 2011-01-14 NOTE — Assessment & Plan Note (Signed)
Resume metformin, check A1C, urine microalbumin. Compliance was reinforced.

## 2011-01-14 NOTE — Assessment & Plan Note (Signed)
Will refer her back to Dr. Jens Som- she did no follow through with his recommendations due to her loss of insurance but is now agreeable to proceed.  Will also refer for sleep study to evaluate her for OSA/OHS.

## 2011-01-14 NOTE — Telephone Encounter (Signed)
Received fax from pharmacy requesting clarification of directions for Metformin 500mg . Per Melissa, directions should be 1 tablet by mouth twice a day. Directions left on pharmacy voicemail and advised them to call if there are additional questions.

## 2011-01-15 LAB — CBC WITH DIFFERENTIAL/PLATELET
HCT: 42.4 % (ref 39.0–52.0)
Hemoglobin: 13.4 g/dL (ref 13.0–17.0)
Lymphocytes Relative: 39 % (ref 12–46)
Lymphs Abs: 3.2 10*3/uL (ref 0.7–4.0)
Monocytes Absolute: 0.5 10*3/uL (ref 0.1–1.0)
Neutro Abs: 4.3 10*3/uL (ref 1.7–7.7)
Neutrophils Relative %: 53 % (ref 43–77)
Platelets: 275 10*3/uL (ref 150–400)
RBC: 5.15 MIL/uL (ref 4.22–5.81)
RDW: 16.4 % — ABNORMAL HIGH (ref 11.5–15.5)
WBC: 8.2 10*3/uL (ref 4.0–10.5)

## 2011-01-15 LAB — HEPATIC FUNCTION PANEL
AST: 10 U/L (ref 0–37)
Alkaline Phosphatase: 61 U/L (ref 39–117)
Bilirubin, Direct: 0.1 mg/dL (ref 0.0–0.3)

## 2011-01-15 LAB — URINALYSIS, MICROSCOPIC ONLY: Crystals: NONE SEEN

## 2011-01-15 LAB — BASIC METABOLIC PANEL
BUN: 10 mg/dL (ref 6–23)
CO2: 27 mEq/L (ref 19–32)
Chloride: 100 mEq/L (ref 96–112)
Glucose, Bld: 235 mg/dL — ABNORMAL HIGH (ref 70–99)

## 2011-01-15 LAB — URINALYSIS, ROUTINE W REFLEX MICROSCOPIC
Glucose, UA: NEGATIVE mg/dL
Hgb urine dipstick: NEGATIVE
Ketones, ur: NEGATIVE mg/dL
Nitrite: NEGATIVE
Protein, ur: NEGATIVE mg/dL
Urobilinogen, UA: 0.2 mg/dL (ref 0.0–1.0)
pH: 5.5 (ref 5.0–8.0)

## 2011-01-15 LAB — LIPID PANEL
HDL: 36 mg/dL — ABNORMAL LOW (ref >39)
Total CHOL/HDL Ratio: 6.2 Ratio
Triglycerides: 92 mg/dL (ref <150)
VLDL: 18 mg/dL (ref 0–40)

## 2011-01-16 ENCOUNTER — Encounter: Payer: Self-pay | Admitting: Family

## 2011-01-16 ENCOUNTER — Telehealth: Payer: Self-pay | Admitting: Family

## 2011-01-16 DIAGNOSIS — E785 Hyperlipidemia, unspecified: Secondary | ICD-10-CM | POA: Insufficient documentation

## 2011-01-16 LAB — URINE CULTURE: Colony Count: 4000

## 2011-01-16 MED ORDER — CIPROFLOXACIN HCL 500 MG PO TABS: 500.0000 mg | ORAL_TABLET | Freq: Two times a day (BID) | ORAL | Status: AC

## 2011-01-16 NOTE — Telephone Encounter (Signed)
Please call patient and let her know that it looks like she has a UTI based on her UA.  I would like her to take cipro x 3 days which I have sent to her pharmacy.  Diabetes shows poor control.  She should plan to resume metformin as we discussed at her visit and work hard on diet and weight loss.

## 2011-01-16 NOTE — Telephone Encounter (Signed)
Pt.notified

## 2011-01-22 ENCOUNTER — Encounter: Payer: Self-pay | Admitting: Cardiology

## 2011-01-23 ENCOUNTER — Encounter: Payer: Self-pay | Admitting: Cardiology

## 2011-01-23 ENCOUNTER — Ambulatory Visit (INDEPENDENT_AMBULATORY_CARE_PROVIDER_SITE_OTHER): Payer: BC Managed Care – PPO | Admitting: Cardiology

## 2011-01-23 ENCOUNTER — Ambulatory Visit: Payer: BC Managed Care – PPO | Admitting: Cardiology

## 2011-01-23 DIAGNOSIS — R079 Chest pain, unspecified: Secondary | ICD-10-CM

## 2011-01-23 DIAGNOSIS — I10 Essential (primary) hypertension: Secondary | ICD-10-CM

## 2011-01-23 DIAGNOSIS — F17200 Nicotine dependence, unspecified, uncomplicated: Secondary | ICD-10-CM

## 2011-01-23 DIAGNOSIS — R0602 Shortness of breath: Secondary | ICD-10-CM

## 2011-01-23 NOTE — Assessment & Plan Note (Signed)
Long discussions today concerning exercise, diet and risk factor modification.

## 2011-01-23 NOTE — Progress Notes (Signed)
HPI: 35 yo female I saw in March of 2011 for evaluation of multiple risk factors and dypnea. We ordered a stress echo but she did not return for this study. She does have dyspnea on exertion but there is no orthopnea, PND, pedal edema, palpitations, syncope or chest pain.  Current Outpatient Prescriptions  Medication Sig Dispense Refill  . esomeprazole (NEXIUM) 40 MG capsule Take 40 mg by mouth daily before breakfast.        . glucose blood (ACCU-CHEK AVIVA) test strip Use to check blood sugar two times daily.  100 each  2  . hydrochlorothiazide 25 MG tablet Take 1 tablet (25 mg total) by mouth daily.  30 tablet  3  . labetalol (NORMODYNE) 100 MG tablet Take 1 tablet (100 mg total) by mouth 2 (two) times daily.  60 tablet  2  . Lancets (ACCU-CHEK MULTICLIX) lancets Use as instructed to check blood sugar twice a day.       . metFORMIN (GLUCOPHAGE) 500 MG tablet Take 1,000 mg by mouth 2 (two) times daily with a meal.       . DISCONTD: esomeprazole (NEXIUM) 40 MG capsule Take 1 capsule (40 mg total) by mouth daily.  30 capsule  1  . DISCONTD: ferrous gluconate (FERGON) 325 MG tablet Take 325 mg by mouth 2 (two) times daily.           Past Medical History  Diagnosis Date  . Unspecified essential hypertension   . Proteinuria   . Excessive or frequent menstruation   . Anemia, unspecified   . Type II or unspecified type diabetes mellitus without mention of complication, not stated as uncontrolled   . Morbid obesity     Past Surgical History  Procedure Date  . Cesarean section   . Ectopic pregnancy surgery     x 2  . Cyst removal   . Cleft palate repair     History   Social History  . Marital Status: Single    Spouse Name: N/A    Number of Children: N/A  . Years of Education: N/A   Occupational History  . Not on file.   Social History Main Topics  . Smoking status: Current Everyday Smoker -- 1.0 packs/day for 10 years    Types: Cigarettes  . Smokeless tobacco: Never Used  .  Alcohol Use: Not on file  . Drug Use: Not on file  . Sexually Active: Not on file   Other Topics Concern  . Not on file   Social History Narrative  . No narrative on file    ROS: no fevers or chills, productive cough, hemoptysis, dysphasia, odynophagia, melena, hematochezia, dysuria, hematuria, rash, seizure activity, orthopnea, PND, pedal edema, claudication. Remaining systems are negative.  Physical Exam: Well-developed obese in no acute distress.  Skin is warm and dry.  HEENT is normal.  Neck is supple. No thyromegaly.  Chest is clear to auscultation with normal expansion.  Cardiovascular exam is regular rate and rhythm.  Abdominal exam nontender or distended. No masses palpated. Extremities show no edema. neuro grossly intact  ECG Normal sinus rhythm at a rate of 86. Nonspecific ST changes.

## 2011-01-23 NOTE — Assessment & Plan Note (Signed)
Most likely secondary to obesity hypoventilation syndrome. However multiple risk factors and patient plans on initiating an exercise program. Schedule stress echocardiogram to exclude ischemia and quantify LV function. If normal no further evaluation. Note she is scheduled to have an evaluation for sleep apnea as well.

## 2011-01-23 NOTE — Assessment & Plan Note (Signed)
Blood pressure controlled. Continue present medications. 

## 2011-01-23 NOTE — Patient Instructions (Signed)
Your physician has requested that you have a stress echocardiogram. For further information please visit [email or web address]. Please follow instruction sheet as given.   

## 2011-01-23 NOTE — Assessment & Plan Note (Signed)
Patient counseled on discontinuing. 

## 2011-01-31 ENCOUNTER — Other Ambulatory Visit (HOSPITAL_COMMUNITY): Payer: Self-pay | Admitting: Cardiology

## 2011-01-31 DIAGNOSIS — R0989 Other specified symptoms and signs involving the circulatory and respiratory systems: Secondary | ICD-10-CM

## 2011-01-31 DIAGNOSIS — R0609 Other forms of dyspnea: Secondary | ICD-10-CM

## 2011-02-01 ENCOUNTER — Ambulatory Visit (HOSPITAL_COMMUNITY): Payer: BC Managed Care – PPO | Attending: Cardiology | Admitting: Radiology

## 2011-02-01 DIAGNOSIS — R079 Chest pain, unspecified: Secondary | ICD-10-CM | POA: Insufficient documentation

## 2011-02-01 DIAGNOSIS — R0989 Other specified symptoms and signs involving the circulatory and respiratory systems: Secondary | ICD-10-CM | POA: Insufficient documentation

## 2011-02-01 DIAGNOSIS — R0609 Other forms of dyspnea: Secondary | ICD-10-CM | POA: Insufficient documentation

## 2011-02-01 DIAGNOSIS — R072 Precordial pain: Secondary | ICD-10-CM

## 2011-02-01 MED ORDER — SODIUM CHLORIDE 0.9 % IV SOLN
40.0000 ug/kg | Freq: Once | INTRAVENOUS | Status: AC
  Administered 2011-02-01: 40 ug/kg/min via INTRAVENOUS

## 2011-02-05 ENCOUNTER — Encounter (HOSPITAL_BASED_OUTPATIENT_CLINIC_OR_DEPARTMENT_OTHER): Payer: BC Managed Care – PPO

## 2011-02-11 ENCOUNTER — Ambulatory Visit (HOSPITAL_BASED_OUTPATIENT_CLINIC_OR_DEPARTMENT_OTHER): Payer: BC Managed Care – PPO | Attending: Internal Medicine

## 2011-02-11 DIAGNOSIS — Z6841 Body Mass Index (BMI) 40.0 and over, adult: Secondary | ICD-10-CM | POA: Insufficient documentation

## 2011-02-11 DIAGNOSIS — G4733 Obstructive sleep apnea (adult) (pediatric): Secondary | ICD-10-CM

## 2011-02-11 DIAGNOSIS — I10 Essential (primary) hypertension: Secondary | ICD-10-CM | POA: Insufficient documentation

## 2011-02-11 HISTORY — DX: Obstructive sleep apnea (adult) (pediatric): G47.33

## 2011-02-13 DIAGNOSIS — I10 Essential (primary) hypertension: Secondary | ICD-10-CM

## 2011-02-13 DIAGNOSIS — G4733 Obstructive sleep apnea (adult) (pediatric): Secondary | ICD-10-CM

## 2011-02-14 NOTE — Procedures (Signed)
NAMEMEKENZIE, Catherine NO.:  [id number]  MEDICAL RECORD NO.:  [id number]          PATIENT TYPE:  OUT  LOCATION:  SLEEP CENTER                 FACILITY:  [facility]  PHYSICIAN:  Oretha Milch, MD      DATE OF BIRTH:  [birth year]  DATE OF STUDY:  02/11/2011                           NOCTURNAL POLYSOMNOGRAM  REFERRING PHYSICIAN:  ROBERT YOO  INDICATION FOR STUDY:  Catherine Espinoza is a morbidly obese woman with witnessed apneas, loud snoring, restless sleep, hypertension and obesity.  At the time of the study, she weighed 332 pounds with a height of 5 feet 5 inches, BMI of 55, neck size of 16.5 inches.  Epworth sleepiness score was 6.  MEDICATIONS:  Included metformin, Nexium, HCTZ, and labetalol.  This intervention polysomnogram was performed with a sleep technologist in attendance.  EEG, EOG, EMG, EKG, and respiratory events were documented.  Sleep stages arousals, limb movements, and respiratory data were scored according to criteria laid out by the American Academy of Sleep Medicine.  SLEEP ARCHITECTURE:  During the diagnostic portion, lights out was 2309 p.m., lights on was at 5:15 a.m.  During the diagnostic portion, total sleep time was 127 minutes with a sleep period time of 167 minutes and sleep efficiency of 70%.  Sleep latency was 14 minutes.  Latency to REM sleep was 75 minutes and wake after sleep onset was 40 minutes.  Sleep stages of the percentage of total sleep time was N1 of 19%, N2 of 74%, N3 of 0% and REM sleep 7.5% (9.5 minutes).  Supine sleep was not noted. During the titration portion, the REM sleep accounted for 54 minutes and was a long period of REM sleep was noted around 4:00 a.m., supine sleep accounted for 86 minutes.  RESPIRATORY DATA:  During the diagnostic portion, there were total of 135 obstructive apneas, zero central apneas, 2 mixed apneas and 149 hypopneas with an apnea/hypopnea index of 135 events per hour and a lowest desaturation of  57%! due to this degree of respiratory disturbance CPAP was initiated at 5 cm with a small full-face mask. CPAP was titrated to a final level of 20 cm due to respiratory events and loud snoring, 3 cm C-Flex was applied.  At the level of 15 cm for 24.5 minutes of REM sleep, 5 hypopneas were noted with a low saturation of 83% and final level of 20 cm for 9 minutes of non-REM sleep, 1 central apnea and 2 hypopneas were noted with a lowest saturation of 90%.  This appears to be the optimal level used during the study.  AROUSAL DATA:  During the diagnostic portion, 55 arousals with an arousal index of 26 events per hour.  Most of these were related respiratory events.  During the titration portion, 8 arousals were noted with an arousal index of 3 events per hour.  LIMB MOVEMENT DATA:  No significant limb movements were noted.  OXYGEN SATURATION DATA:  The lowest desaturation during the diagnostic portion was 57%, desaturation index was 167 events per hour.  On the higher levels of CPAP, the lowest desaturation noted was 86%.  CARDIAC DATA:  The low heart rate was 38 beats per  minute, the high heart rate recorded was an artifact.  No arrhythmias were noted.  DISCUSSION:  She was desensitized with a small Quattro full face mask, C- Flex of 3 cm were applied.  No oxygen was applied.  Desaturations were noted due to frequent respiratory events.  She reported feeling rested the night after the morning after the study.  IMPRESSION: 1. Severe obstructive sleep apnea with hypopneas causing oxygen     desaturation and sleep fragmentation. 2. This was corrected by CPAP of 20 cm with a small full face mask. 3. There was no evidence of cardiac arrhythmias, limb movements or     behavioral disturbance during sleep.  RECOMMENDATIONS: 1. Treatment options for this degree of sleep disordered breathing     include weight loss and CPAP therapy. 2. CPAP should be initiated with a goal of 20 cm with a  small full     face mask, alternatively auto CPAP of 15-20 cm can be initiated     with a small Mirage full face mask and heated humidity in the C-     Flex setting of 3 cm of water download can be obtained in about a     month's time for compliance and adequate control of events at this     pressure. 3. She should be cautioned against driving when sleepy with the     understanding that sleepiness can vary on day to day basis. 4. She should be asked to avoid medications with sedative side     effects.     Oretha Milch, MD Electronically Signed    RVA/MEDQ  D:  02/13/2011 14:48:40  T:  02/14/2011 01:39:21  Job:  [id number]  cc:   Sandford Craze, NP  Barbette Hair. [place], DO [address] [place], [place] [address]

## 2011-02-15 ENCOUNTER — Other Ambulatory Visit: Payer: Self-pay | Admitting: Family

## 2011-02-15 ENCOUNTER — Encounter: Payer: Self-pay | Admitting: Family

## 2011-02-15 ENCOUNTER — Ambulatory Visit (INDEPENDENT_AMBULATORY_CARE_PROVIDER_SITE_OTHER): Payer: BC Managed Care – PPO | Admitting: Family

## 2011-02-15 ENCOUNTER — Telehealth: Payer: Self-pay | Admitting: Family

## 2011-02-15 DIAGNOSIS — N926 Irregular menstruation, unspecified: Secondary | ICD-10-CM

## 2011-02-15 DIAGNOSIS — E785 Hyperlipidemia, unspecified: Secondary | ICD-10-CM

## 2011-02-15 DIAGNOSIS — R0602 Shortness of breath: Secondary | ICD-10-CM

## 2011-02-15 DIAGNOSIS — Z720 Tobacco use: Secondary | ICD-10-CM

## 2011-02-15 DIAGNOSIS — G4733 Obstructive sleep apnea (adult) (pediatric): Secondary | ICD-10-CM | POA: Insufficient documentation

## 2011-02-15 DIAGNOSIS — F17200 Nicotine dependence, unspecified, uncomplicated: Secondary | ICD-10-CM

## 2011-02-15 DIAGNOSIS — E119 Type 2 diabetes mellitus without complications: Secondary | ICD-10-CM

## 2011-02-15 DIAGNOSIS — I10 Essential (primary) hypertension: Secondary | ICD-10-CM

## 2011-02-15 MED ORDER — VARENICLINE TARTRATE 0.5 MG PO TABS: 0.5000 mg | ORAL_TABLET | Freq: Two times a day (BID) | ORAL | Status: DC

## 2011-02-15 MED ORDER — BUPROPION HCL ER (SR) 150 MG PO TB12: 150.0000 mg | ORAL_TABLET | Freq: Two times a day (BID) | ORAL | Status: DC

## 2011-02-15 MED ORDER — COLESEVELAM HCL 625 MG PO TABS: 1875.0000 mg | ORAL_TABLET | Freq: Two times a day (BID) | ORAL | Status: DC

## 2011-02-15 NOTE — Patient Instructions (Addendum)
Chantix-Start: 0.5 mg PO qd x3 days, then 0.5 mg PO bid x4 days; Max: 2 mg/day; Info: give w/ food; start drug 1wk before quit date if quit date planned. Please follow up week of 7/23, complete your blood work the week before.  You will be contacted by home health about your CPAP machine.

## 2011-02-15 NOTE — Assessment & Plan Note (Signed)
The patient underwent a stress echo which was ordered by Dr. Jens Som. Report is reviewed today. Stress EKG was normal. In addition there were no stress induced wall motion abnormalities.

## 2011-02-15 NOTE — Telephone Encounter (Signed)
She can try Zyban- this may be covered by insurance.  She will need to take one tablet daily x 3 days, then increase to BID.  Stop smoking after 1 week. Call if she develops depression while taking this medication. Follow up in 1 month.

## 2011-02-15 NOTE — Assessment & Plan Note (Addendum)
Patient is motivated to quit. We will try her on Chantix. Risks were discussed with the patient including risk of worsening depression and suicidal ideation. She is instructed to discontinue medicine and call us or go to the emergency department should this occur. She verbalizes understanding. 5 minutes spent counseling patient on smoking cessation.

## 2011-02-15 NOTE — Telephone Encounter (Signed)
Future lab order entered for 04/15/11 and taken to the lab.

## 2011-02-15 NOTE — Assessment & Plan Note (Signed)
She brings with her today a copy of her blood sugars which she has been recording regularly. A significant improvement in her numbers was noted after initiation of metformin. Hopefully her followup A1c in July will be much improved from her former reading of 9.6. We will also WelChol both for her hyperlipidemia as well as improvement in her A1c.

## 2011-02-15 NOTE — Telephone Encounter (Signed)
INSURANCE WILL NOT PAY FOR CHANTIX  IT IS $200 FOR ONE MONTH.  IS THERE SOME ALTERNATIVE

## 2011-02-15 NOTE — Assessment & Plan Note (Addendum)
Patient requests referral to GYN.

## 2011-02-15 NOTE — Assessment & Plan Note (Signed)
She reports that she did not feel well on the labetalol and discontinued it. She is currently taking hydrochlorothiazide. BP control is reasonable at this time. Plan to followup next visit.

## 2011-02-15 NOTE — Telephone Encounter (Signed)
Melissa, please advise

## 2011-02-15 NOTE — Assessment & Plan Note (Addendum)
Will refer for home health to initiate auto CPAP at 15-20 cm per Dr. Carlena Sax recommendation. We also discussed importance of weight loss.

## 2011-02-15 NOTE — Telephone Encounter (Signed)
Pt notified of change to Zyban per instructions below.  F/u scheduled for 03/19/11 @ 9:30. Pt advised to call us Monday if insurance will not cover Zyban and Melissa will have her try the patch. Pt voices understanding.

## 2011-02-15 NOTE — Assessment & Plan Note (Signed)
The patient's LDL is 170. We discussed the importance of diet exercise and weight loss. I am hesitant to place her on a statin at this time as she is of childbearing age and is not using any birth control. I am going to be referring her to GYN. Perhaps he could be fitted with an IUD which would open up the option of a statin. In the meantime continue dietary modification and add WelChol which is category B.

## 2011-02-15 NOTE — Progress Notes (Signed)
Subjective:    Patient ID: Toribio Harbour, female    DOB: [birth year], 35 y.o.   MRN: [id number]  HPI  Ms.  Mccarrell is a 35 yr old female who presents today for follow up.  1.  Irregular menses- she reports that she only menstruates about once every 3 months.  + hot flashes. Requests referral to a new GYN.   2.  Snoring-  She tested + for sleep apnea. Notes + daytime an somnolence and difficulty getting up in the morning.   3.   DM-  Brings her meter with her.  Her sugar has been running mid to high 100's fasting.  Post prandial <150.    Review of Systems See HPI  Past Medical History  Diagnosis Date  . Unspecified essential hypertension   . Proteinuria   . Excessive or frequent menstruation   . Anemia, unspecified   . Type II or unspecified type diabetes mellitus without mention of complication, not stated as uncontrolled   . Morbid obesity     History   Social History  . Marital Status: Single    Spouse Name: N/A    Number of Children: N/A  . Years of Education: N/A   Occupational History  . Not on file.   Social History Main Topics  . Smoking status: Current Everyday Smoker -- 1.0 packs/day for 10 years    Types: Cigarettes  . Smokeless tobacco: Never Used  . Alcohol Use: Not on file  . Drug Use: Not on file  . Sexually Active: Not on file   Other Topics Concern  . Not on file   Social History Narrative  . No narrative on file    Past Surgical History  Procedure Date  . Cesarean section   . Ectopic pregnancy surgery     x 2  . Cyst removal   . Cleft palate repair     Family History  Problem Relation Age of Onset  . Cancer Father     oral cancer  . Heart attack Maternal Aunt     Allergies  Allergen Reactions  . Labetalol Nausea Only  . Ibuprofen     REACTION: knots in mouth    Current Outpatient Prescriptions on File Prior to Visit  Medication Sig Dispense Refill  . esomeprazole (NEXIUM) 40 MG capsule Take 40 mg by mouth daily before  breakfast.        . glucose blood (ACCU-CHEK AVIVA) test strip Use to check blood sugar two times daily.  100 each  2  . hydrochlorothiazide 25 MG tablet Take 1 tablet (25 mg total) by mouth daily.  30 tablet  3  . Lancets (ACCU-CHEK MULTICLIX) lancets Use as instructed to check blood sugar twice a day.       . metFORMIN (GLUCOPHAGE) 500 MG tablet Take 1,000 mg by mouth 2 (two) times daily with a meal.       . DISCONTD: labetalol (NORMODYNE) 100 MG tablet Take 1 tablet (100 mg total) by mouth 2 (two) times daily.  60 tablet  2    BP 130/88  Pulse 90  Temp(Src) 98.3 F (36.8 C) (Oral)  Resp 20  Ht 5\' 5"  (1.651 m)  Wt 340 lb 0.6 oz (154.241 kg)  BMI 56.59 kg/m2       Objective:   Physical Exam General: Pleasant morbidly obese female, awake, alert, and in no acute distress Cardiovascular: S1, S2, regular rate and rhythm, no murmur Respiratory: Breath sounds clear to auscultation bilaterally  without wheezes, rales, or rhonchi. Psychiatric:  Alert, and oriented x3. Calm and pleasant.       Assessment & Plan:  Greater than 25 minutes were spent with the patient today. Greater than 50% this time was spent counseling the patient on diet, exercise, weight loss, diabetes and smoking cessation.

## 2011-02-18 ENCOUNTER — Telehealth: Payer: Self-pay | Admitting: *Deleted

## 2011-02-18 NOTE — Telephone Encounter (Signed)
Received message from [facility] at [facility] requesting a copy of pt's sleep study and insurance info.  Faxed requested info to [phone];  [phone].

## 2011-03-04 ENCOUNTER — Encounter: Payer: Self-pay | Admitting: Family

## 2011-03-05 ENCOUNTER — Encounter: Payer: Self-pay | Admitting: Internal Medicine

## 2011-03-06 ENCOUNTER — Emergency Department (HOSPITAL_BASED_OUTPATIENT_CLINIC_OR_DEPARTMENT_OTHER)
Admission: EM | Admit: 2011-03-06 | Discharge: 2011-03-07 | Disposition: A | Discharge status: Home / Self Care | Payer: BC Managed Care – PPO | Attending: Emergency Medicine | Admitting: Emergency Medicine

## 2011-03-06 DIAGNOSIS — I10 Essential (primary) hypertension: Secondary | ICD-10-CM | POA: Insufficient documentation

## 2011-03-06 DIAGNOSIS — Z79899 Other long term (current) drug therapy: Secondary | ICD-10-CM | POA: Insufficient documentation

## 2011-03-06 DIAGNOSIS — B029 Zoster without complications: Secondary | ICD-10-CM | POA: Insufficient documentation

## 2011-03-06 DIAGNOSIS — K219 Gastro-esophageal reflux disease without esophagitis: Secondary | ICD-10-CM | POA: Insufficient documentation

## 2011-03-07 ENCOUNTER — Telehealth: Payer: Self-pay | Admitting: Family

## 2011-03-07 NOTE — Telephone Encounter (Signed)
Please call patient to arrange a follow up visit next week from ED for shingles.

## 2011-03-08 NOTE — Telephone Encounter (Signed)
Patient has returned phone call, she has been advised per [facility] instructions. She has scheduled followed up for 03/13/2011 @ 10:45am

## 2011-03-08 NOTE — Telephone Encounter (Signed)
Left message on machine to return my call. 

## 2011-03-08 NOTE — Telephone Encounter (Signed)
Please cancel 6/19 apt.

## 2011-03-08 NOTE — Telephone Encounter (Signed)
Pt also has appts scheduled with [place] for 6/19 and 7/23. Do we need to cancel these as pt is coming in on 03/13/11?

## 2011-03-13 ENCOUNTER — Ambulatory Visit (INDEPENDENT_AMBULATORY_CARE_PROVIDER_SITE_OTHER): Payer: BC Managed Care – PPO | Admitting: Family

## 2011-03-13 ENCOUNTER — Encounter: Payer: Self-pay | Admitting: Family

## 2011-03-13 DIAGNOSIS — B029 Zoster without complications: Secondary | ICD-10-CM | POA: Insufficient documentation

## 2011-03-13 NOTE — Patient Instructions (Signed)
Please follow up in the end of July. Call if the pain does not improve on your face/neck or if the rash does not completely resolve over the next 1-2 weeks.

## 2011-03-13 NOTE — Progress Notes (Signed)
Subjective:    Patient ID: Catherine Espinoza, female    DOB: April 22, 1976, 35 y.o.   MRN: [id number]  HPI  Catherine Espinoza is a 35 yr old female who presents today for follow from the ED Wednesday night when she was diagnosed with shingles.  She reports that she had a 1 week history of pain on the left side of her neck which developed into blisters Wednesday night. She was treated with famcyclovir, percocet and prednisone. Still having some pain.   OSA- tolerating and feels much better during the day.      Review of Systems  See history of present illness  Past Medical History  Diagnosis Date  . Unspecified essential hypertension   . Proteinuria   . Excessive or frequent menstruation   . Anemia, unspecified   . Type II or unspecified type diabetes mellitus without mention of complication, not stated as uncontrolled   . Morbid obesity   . Obstructive sleep apnea 02/11/11    Sleep study: severe OSA- rec CPAP 20cm small  full face mask    History   Social History  . Marital Status: Single    Spouse Name: N/A    Number of Children: N/A  . Years of Education: N/A   Occupational History  . cna/med tech    Social History Main Topics  . Smoking status: Current Everyday Smoker -- 0.5 packs/day for 10 years    Types: Cigarettes  . Smokeless tobacco: Never Used  . Alcohol Use: Yes     occasional use  . Drug Use: No  . Sexually Active: Not on file   Other Topics Concern  . Not on file   Social History Narrative   Married-filing for divorced8 year old daughterWorks as cna/med tech    Past Surgical History  Procedure Date  . Cesarean section   . Ectopic pregnancy surgery     x 2  . Cyst removal   . Cleft palate repair     Family History  Problem Relation Age of Onset  . Cancer Father     oral cancer  . Heart attack Maternal Aunt     Allergies  Allergen Reactions  . Labetalol Nausea Only  . Ibuprofen     REACTION: knots in mouth    Current Outpatient  Prescriptions on File Prior to Visit  Medication Sig Dispense Refill  . buPROPion (WELLBUTRIN SR) 150 MG 12 hr tablet Take 1 tablet (150 mg total) by mouth 2 (two) times daily.  60 tablet  1  . esomeprazole (NEXIUM) 40 MG capsule Take 40 mg by mouth daily before breakfast.        . glucose blood (ACCU-CHEK AVIVA) test strip Use to check blood sugar two times daily.  100 each  2  . hydrochlorothiazide 25 MG tablet Take 1 tablet (25 mg total) by mouth daily.  30 tablet  3  . Lancets (ACCU-CHEK MULTICLIX) lancets Use as instructed to check blood sugar twice a day.       . metFORMIN (GLUCOPHAGE) 500 MG tablet Take 1,000 mg by mouth 2 (two) times daily with a meal.       . colesevelam (WELCHOL) 625 MG tablet Take 3 tablets (1,875 mg total) by mouth 2 (two) times daily with a meal.  180 tablet  3    BP 130/88  Pulse 96  Temp(Src) 98 F (36.7 C) (Oral)  Resp 16  Wt 336 lb 1.9 oz (152.463 kg)  LMP 02/20/2011  Objective:   Physical Exam  General: Morbidly obese female, awake and alert no acute distress Skin: She is noted to have multiple vesicular lesions on her left cheek and behind her left ear. Mild associated erythema. Psychiatric: Awake, alert, pleasant and appropriate affect        Assessment & Plan:

## 2011-03-13 NOTE — Assessment & Plan Note (Signed)
Patient instructed to complete Famvir, contact us if lesions do not continue to improve or she develops worsening associated erythema. She is also instructed to contact us if her pain does not continue to improve.

## 2011-03-19 ENCOUNTER — Ambulatory Visit: Payer: BC Managed Care – PPO | Admitting: Family

## 2011-03-23 ENCOUNTER — Encounter: Payer: Self-pay | Admitting: Family

## 2011-04-22 ENCOUNTER — Encounter: Payer: Self-pay | Admitting: Family

## 2011-04-22 ENCOUNTER — Ambulatory Visit (INDEPENDENT_AMBULATORY_CARE_PROVIDER_SITE_OTHER): Payer: BC Managed Care – PPO | Admitting: Family

## 2011-04-22 VITALS — BP 118/80 | HR 84 | Temp 97.8°F | Resp 16 | Ht 65.0 in | Wt 332.1 lb

## 2011-04-22 DIAGNOSIS — E119 Type 2 diabetes mellitus without complications: Secondary | ICD-10-CM

## 2011-04-22 DIAGNOSIS — F17200 Nicotine dependence, unspecified, uncomplicated: Secondary | ICD-10-CM

## 2011-04-22 DIAGNOSIS — E785 Hyperlipidemia, unspecified: Secondary | ICD-10-CM

## 2011-04-22 DIAGNOSIS — G4733 Obstructive sleep apnea (adult) (pediatric): Secondary | ICD-10-CM

## 2011-04-22 DIAGNOSIS — I10 Essential (primary) hypertension: Secondary | ICD-10-CM

## 2011-04-22 MED ORDER — METFORMIN HCL 500 MG PO TABS: 1000.0000 mg | ORAL_TABLET | Freq: Two times a day (BID) | ORAL | Status: DC

## 2011-04-22 MED ORDER — COLESEVELAM HCL 3.75 G PO PACK: PACK | ORAL | Status: DC

## 2011-04-22 MED ORDER — ESOMEPRAZOLE MAGNESIUM 40 MG PO CPDR: 40.0000 mg | DELAYED_RELEASE_CAPSULE | Freq: Every day | ORAL | Status: DC

## 2011-04-22 NOTE — Assessment & Plan Note (Signed)
Will request pt assist forms at pt request, though I doubt that she will qualify as she is insured and employed.

## 2011-04-22 NOTE — Assessment & Plan Note (Signed)
She was unable to tolerate WelChol tablets. She is going to try powder instead. Plan to repeat lipid panel next visit.

## 2011-04-22 NOTE — Assessment & Plan Note (Signed)
A1c last visit was greater than 9. Her readings seem to be better per report. We'll repeat A1c today.

## 2011-04-22 NOTE — Assessment & Plan Note (Signed)
BP Readings from Last 3 Encounters:  04/22/11 118/80  03/13/11 130/88  02/15/11 130/88  BP is stable. Continue HCTZ.

## 2011-04-22 NOTE — Assessment & Plan Note (Signed)
She reports tolerating CPAP, feeling much better. She is encouraged to continue CPAP

## 2011-04-22 NOTE — Patient Instructions (Signed)
Please complete your blood work on the first floor.  Follow up in 3 months- come fasting to this appointment.

## 2011-04-22 NOTE — Progress Notes (Signed)
Subjective:    Patient ID: Catherine Espinoza, female    DOB: 26-Jan-1976, 35 y.o.   MRN: [id number]  HPI  Catherine Espinoza is 35 yr old female who presents today for follow up.   OSA- tolerating CPAP, less drowsy during the day, napping less.    Tobacco cessation-  Unable to afford chantix.  Wants to see if she can qualify for pt assistance.  She has cut back to 1/2 pack a day.  DM2- she reports that she is compliant with metformin-  Notes that her fasting sugars are 160-180.  In the evenings sugars are 130-140's.  Finds it hard to eat in the mornings.  Last eye exam was 1 yr ago.    HTN-Pt has hd chronic HTN for quiet sometime. She is currently on hydrochlorothiazide, and well controlled. No associated S/S related to HTN.   Quality: chronic Modifying factor: meds Duration: Quite sometime Associated S/S: None.  The patient denies the following associated symptoms: Chest pain, dyspnea, blurred vision, headache, or lower extremity edema.  Review of Systems See history of present illness  Past Medical History  Diagnosis Date  . Unspecified essential hypertension   . Proteinuria   . Excessive or frequent menstruation   . Anemia, unspecified   . Type II or unspecified type diabetes mellitus without mention of complication, not stated as uncontrolled   . Morbid obesity   . Obstructive sleep apnea 02/11/11    Sleep study: severe OSA- rec CPAP 20cm small  full face mask    History   Social History  . Marital Status: Single    Spouse Name: N/A    Number of Children: N/A  . Years of Education: N/A   Occupational History  . cna/med tech    Social History Main Topics  . Smoking status: Current Everyday Smoker -- 0.5 packs/day for 10 years    Types: Cigarettes  . Smokeless tobacco: Never Used  . Alcohol Use: Yes     occasional use  . Drug Use: No  . Sexually Active: Not on file   Other Topics Concern  . Not on file   Social History Narrative   Married-filing for divorced63  year old daughterWorks as cna/med tech    Past Surgical History  Procedure Date  . Cesarean section   . Ectopic pregnancy surgery     x 2  . Cyst removal   . Cleft palate repair     Family History  Problem Relation Age of Onset  . Cancer Father     oral cancer  . Heart attack Maternal Aunt     Allergies  Allergen Reactions  . Labetalol Nausea Only  . Ibuprofen     REACTION: knots in mouth    Current Outpatient Prescriptions on File Prior to Visit  Medication Sig Dispense Refill  . buPROPion (WELLBUTRIN SR) 150 MG 12 hr tablet Take 1 tablet (150 mg total) by mouth 2 (two) times daily.  60 tablet  1  . glucose blood (ACCU-CHEK AVIVA) test strip Use to check blood sugar two times daily.  100 each  2  . hydrochlorothiazide 25 MG tablet Take 1 tablet (25 mg total) by mouth daily.  30 tablet  3  . Lancets (ACCU-CHEK MULTICLIX) lancets Use as instructed to check blood sugar twice a day.       . clindamycin (CLINDAGEL) 1 % gel Apply topically 2 (two) times daily.          BP 118/80  Pulse  84  Temp(Src) 97.8 F (36.6 C) (Oral)  Resp 16  Ht 5\' 5"  (1.651 m)  Wt 332 lb 1.3 oz (150.63 kg)  BMI 55.26 kg/m2  LMP 04/18/2011       Objective:   Physical Exam  Constitutional: She appears well-developed and well-nourished.       Pleasant, morbidly obese female awake, alert, and in no acute distress.  HENT:  Head: Normocephalic and atraumatic.  Right Ear: Tympanic membrane and ear canal normal.  Left Ear: Tympanic membrane and ear canal normal.  Neck: Neck supple.  Cardiovascular: Normal rate and regular rhythm.   Pulmonary/Chest: Effort normal and breath sounds normal.  Skin: Skin is warm and dry.  Psychiatric: She has a normal mood and affect. Her behavior is normal. Judgment and thought content normal.          Assessment & Plan:

## 2011-04-23 ENCOUNTER — Telehealth: Payer: Self-pay | Admitting: Family

## 2011-04-23 MED ORDER — SITAGLIPTIN PHOSPHATE 100 MG PO TABS: 100.0000 mg | ORAL_TABLET | Freq: Every day | ORAL | Status: DC

## 2011-04-23 NOTE — Telephone Encounter (Signed)
Please call patient and let her know that her A1C (diabetes test looks worse). It was 9 now it is >10.  I would like her to work hard on diet and exercise and to add Januvia once daily.  Rx has been sent to her pharmacy.

## 2011-04-23 NOTE — Telephone Encounter (Signed)
Pt.notified

## 2011-04-25 ENCOUNTER — Telehealth: Payer: Self-pay | Admitting: *Deleted

## 2011-04-25 NOTE — Telephone Encounter (Signed)
Pt returned my call. Advised pt to go to [email or web address] and click on "home" and take questionaire to determine if she will qualify for pt assistance. Pt voices understanding.

## 2011-04-25 NOTE — Telephone Encounter (Signed)
Attempted to reach pt re: pt assistance for Chantix. Does not look like pt will qualify but need her to complete questionaire from [employer] helpful answers web site.  Left message for pt to return my call.

## 2011-04-25 NOTE — Telephone Encounter (Signed)
Message copied by Kathi Simpers on Thu Apr 25, 2011  9:37 AM ------      Message from: O'SULLIVAN, MELISSA      Created: Mon Apr 22, 2011  3:34 PM       Would you mind initiating chantix pt assist?  I am not sure that she can apply though if she has insurance.  She wanted [place] to check. Thanks

## 2011-07-22 ENCOUNTER — Ambulatory Visit: Payer: BC Managed Care – PPO | Admitting: Family

## 2011-07-22 DIAGNOSIS — Z0289 Encounter for other administrative examinations: Secondary | ICD-10-CM

## 2011-07-30 ENCOUNTER — Encounter (HOSPITAL_BASED_OUTPATIENT_CLINIC_OR_DEPARTMENT_OTHER): Payer: Self-pay | Admitting: *Deleted

## 2011-07-30 ENCOUNTER — Emergency Department (HOSPITAL_BASED_OUTPATIENT_CLINIC_OR_DEPARTMENT_OTHER)
Admission: EM | Admit: 2011-07-30 | Discharge: 2011-07-30 | Disposition: A | Discharge status: Home / Self Care | Payer: BC Managed Care – PPO | Attending: Emergency Medicine | Admitting: Emergency Medicine

## 2011-07-30 DIAGNOSIS — IMO0002 Reserved for concepts with insufficient information to code with codable children: Secondary | ICD-10-CM | POA: Insufficient documentation

## 2011-07-30 DIAGNOSIS — I10 Essential (primary) hypertension: Secondary | ICD-10-CM | POA: Insufficient documentation

## 2011-07-30 DIAGNOSIS — G4733 Obstructive sleep apnea (adult) (pediatric): Secondary | ICD-10-CM | POA: Insufficient documentation

## 2011-07-30 DIAGNOSIS — L02419 Cutaneous abscess of limb, unspecified: Secondary | ICD-10-CM

## 2011-07-30 MED ORDER — DOXYCYCLINE HYCLATE 100 MG PO CAPS: 100.0000 mg | ORAL_CAPSULE | Freq: Two times a day (BID) | ORAL | Status: AC

## 2011-07-30 MED ORDER — CEFTRIAXONE SODIUM 1 G IJ SOLR
1.0000 g | Freq: Once | INTRAMUSCULAR | Status: AC
  Administered 2011-07-30: 1 g via INTRAMUSCULAR
  Filled 2011-07-30: qty 1

## 2011-07-30 MED ORDER — DOXYCYCLINE HYCLATE 100 MG PO CAPS: 100.0000 mg | ORAL_CAPSULE | Freq: Two times a day (BID) | ORAL | Status: DC

## 2011-07-30 MED ORDER — HYDROCODONE-ACETAMINOPHEN 5-325 MG PO TABS: 2.0000 | ORAL_TABLET | ORAL | Status: AC | PRN

## 2011-07-30 NOTE — ED Notes (Signed)
Patient states she has a boil under her right axillary area for one week.  States pain has progressively worsened, using heat with no relief.

## 2011-07-30 NOTE — ED Provider Notes (Signed)
Medical screening examination/treatment/procedure(s) were performed by non-physician practitioner and as supervising physician I was immediately available for consultation/collaboration.   Dayton Bailiff, MD 07/30/11 [phone]

## 2011-07-30 NOTE — ED Provider Notes (Signed)
History     CSN: [id number] Arrival date & time: 07/30/2011  6:13 PM   First MD Initiated Contact with Patient 07/30/11 1846      Chief Complaint  Patient presents with  . Abscess    right axillary    (Consider location/radiation/quality/duration/timing/severity/associated sxs/prior treatment) [name] presenting with abscess. The history is provided by the patient. No language interpreter was used.  Abscess  This is a recurrent problem. The current episode started more than one week ago. The problem occurs frequently. The problem has been gradually worsening. The abscess is present on the right arm. The problem is severe. The abscess is characterized by itchiness, redness, painfulness, burning and swelling. It is unknown what she was exposed to. The abscess first occurred at home. There were no sick contacts. Recently, medical care has been given by the PCP.  Pt complains of swelling to right axilla,   Past Medical History  Diagnosis Date  . Unspecified essential hypertension   . Proteinuria   . Excessive or frequent menstruation   . Anemia, unspecified   . Type II or unspecified type diabetes mellitus without mention of complication, not stated as uncontrolled   . Morbid obesity   . Obstructive sleep apnea 02/11/11    Sleep study: severe OSA- rec CPAP 20cm small  full face mask    Past Surgical History  Procedure Date  . Cesarean section   . Ectopic pregnancy surgery     x 2  . Cyst removal   . Cleft palate repair     Family History  Problem Relation Age of Onset  . Cancer Father     oral cancer  . Heart attack Maternal Aunt     History  Substance Use Topics  . Smoking status: Current Everyday Smoker -- 0.5 packs/day for 10 years    Types: Cigarettes  . Smokeless tobacco: Never Used  . Alcohol Use: Yes     occasional use    OB History    Grav Para Term Preterm Abortions TAB SAB Ect Mult Living                  Review of Systems    Skin: Positive for wound.  All other systems reviewed and are negative.    Allergies  Labetalol; Aspirin; and Ibuprofen  Home Medications   Current Outpatient Rx  Name Route Sig Dispense Refill  . BUPROPION HCL ER (SR) 150 MG PO TB12 Oral Take 1 tablet (150 mg total) by mouth 2 (two) times daily. 60 tablet 1  . COLESEVELAM HCL 3.75 G PO PACK  One packet in 4 ounces of water once daily 30 each 3    [place] # [id number] Supervising Physician Dr. Thomos Lemons ...  . ESOMEPRAZOLE MAGNESIUM 40 MG PO CPDR Oral Take 1 capsule (40 mg total) by mouth daily before breakfast. 30 capsule 5    [place] # [id number] Supervising Physician Dr. Thomos Lemons ...  . GLUCOSE BLOOD VI STRP  Use to check blood sugar two times daily. 100 each 2  . ACCU-CHEK MULTICLIX LANCETS MISC  Use as instructed to check blood sugar twice a day.     Marland Kitchen METFORMIN HCL 500 MG PO TABS Oral Take 2 tablets (1,000 mg total) by mouth 2 (two) times daily with a meal. 60 tablet 3  . SITAGLIPTIN PHOSPHATE 100 MG PO TABS Oral Take 1 tablet (100 mg total) by mouth daily. 30 tablet 3  . VITAMIN E 100  UNITS PO CAPS Oral Take 100 Units by mouth daily.        BP 141/99  Pulse 108  Temp(Src) 98.3 F (36.8 C) (Oral)  Resp 18  Ht 5\' 6"  (1.676 m)  Wt 320 lb (145.151 kg)  BMI 51.65 kg/m2  SpO2 95%  LMP 05/30/2011  Physical Exam  Nursing note and vitals reviewed. Constitutional: She is oriented to person, place, and time. She appears well-developed and well-nourished.  HENT:  Head: Normocephalic.  Eyes: Pupils are equal, round, and reactive to light.  Neck: Normal range of motion.  Cardiovascular: Normal rate.   Pulmonary/Chest: Effort normal.  Musculoskeletal: Normal range of motion.  Neurological: She is alert and oriented to person, place, and time.  Skin: There is erythema.  Psychiatric: She has a normal mood and affect.  swollen tender right axilla,    ED Course  Procedures (including critical care time) I ultrasounded area,  I do not  see area of abscess,  Multiple areas that I think are lymph nodes.   Labs Reviewed - No data to display No results found.   No diagnosis found.    MDM          Langston Masker, PA 07/30/11 1928  Langston Masker, [place] 07/30/11 [phone]

## 2011-07-31 ENCOUNTER — Telehealth: Payer: Self-pay | Admitting: Family

## 2011-07-31 NOTE — Telephone Encounter (Signed)
Pls call pt to arrange an ED follow up Friday or Monday for the boil under her arm.

## 2011-07-31 NOTE — Telephone Encounter (Signed)
Call placed to patient at [phone], she was informed per Sandford Craze instructions. Patient has scheduled for Friday 08/02/2011 @ 10:45 am.

## 2011-08-02 ENCOUNTER — Other Ambulatory Visit: Payer: Self-pay | Admitting: Family

## 2011-08-02 ENCOUNTER — Ambulatory Visit (INDEPENDENT_AMBULATORY_CARE_PROVIDER_SITE_OTHER): Payer: BC Managed Care – PPO | Admitting: Family

## 2011-08-02 ENCOUNTER — Encounter: Payer: Self-pay | Admitting: Family

## 2011-08-02 VITALS — BP 120/76 | HR 90 | Temp 97.8°F | Resp 16 | Ht 65.0 in | Wt 329.0 lb

## 2011-08-02 DIAGNOSIS — N926 Irregular menstruation, unspecified: Secondary | ICD-10-CM

## 2011-08-02 DIAGNOSIS — Z5181 Encounter for therapeutic drug level monitoring: Secondary | ICD-10-CM

## 2011-08-02 DIAGNOSIS — N920 Excessive and frequent menstruation with regular cycle: Secondary | ICD-10-CM

## 2011-08-02 DIAGNOSIS — E119 Type 2 diabetes mellitus without complications: Secondary | ICD-10-CM

## 2011-08-02 DIAGNOSIS — R5381 Other malaise: Secondary | ICD-10-CM

## 2011-08-02 DIAGNOSIS — Z23 Encounter for immunization: Secondary | ICD-10-CM

## 2011-08-02 DIAGNOSIS — R5383 Other fatigue: Secondary | ICD-10-CM | POA: Insufficient documentation

## 2011-08-02 DIAGNOSIS — L732 Hidradenitis suppurativa: Secondary | ICD-10-CM | POA: Insufficient documentation

## 2011-08-02 LAB — BASIC METABOLIC PANEL WITH GFR
BUN: 7 mg/dL (ref 6–23)
CO2: 26 meq/L (ref 19–32)
Calcium: 9.5 mg/dL (ref 8.4–10.5)
Chloride: 99 meq/L (ref 96–112)
Creat: 0.56 mg/dL (ref 0.50–1.10)
Glucose, Bld: 361 mg/dL — ABNORMAL HIGH (ref 70–99)
Potassium: 4.2 meq/L (ref 3.5–5.3)
Sodium: 134 meq/L — ABNORMAL LOW (ref 135–145)

## 2011-08-02 LAB — HEPATIC FUNCTION PANEL
Alkaline Phosphatase: 70 U/L (ref 39–117)
Bilirubin, Direct: 0.1 mg/dL (ref 0.0–0.3)
Indirect Bilirubin: 0.1 mg/dL (ref 0.0–0.9)
Total Bilirubin: 0.2 mg/dL — ABNORMAL LOW (ref 0.3–1.2)

## 2011-08-02 LAB — HEMOGLOBIN A1C
Hgb A1c MFr Bld: 9.4 % — ABNORMAL HIGH (ref <5.7)
Mean Plasma Glucose: 223 mg/dL — ABNORMAL HIGH (ref <117)

## 2011-08-02 MED ORDER — TERBINAFINE HCL 250 MG PO TABS: 250.0000 mg | ORAL_TABLET | Freq: Every day | ORAL | Status: AC

## 2011-08-02 NOTE — Assessment & Plan Note (Addendum)
Deteriorated. I am concerned about the possibility of anemia in the setting of ongoing menstrual loss.  Check CBC.

## 2011-08-02 NOTE — Patient Instructions (Signed)
Complete your blood work prior to leaving today.  Please schedule a diabetic eye exam with your doctor.  Keep your upcoming apt with GYN. Follow up in 3 months.

## 2011-08-02 NOTE — Progress Notes (Signed)
Subjective:    Patient ID: Catherine Espinoza, female    DOB: 1976/06/24, 35 y.o.   MRN: [id number]  HPI  Ms.  Espinoza is a 35 yr old female who presents today for ED follow up. ED records are reviewed. She was evaluated for a "boil" under the right arm in the ED on 10/30 and was started on doxycycline.  Notes improvement.  She reports that it is less swollen and that it is now draining.  Vaginal bleeding- She is following with Catherine Espinoza- has been "bleeding since August."  Feels exhausted. She notes fatigue despite use of her CPAP.  Review of Systems See HPI  Past Medical History  Diagnosis Date  . Unspecified essential hypertension   . Proteinuria   . Excessive or frequent menstruation   . Anemia, unspecified   . Type II or unspecified type diabetes mellitus without mention of complication, not stated as uncontrolled   . Morbid obesity   . Obstructive sleep apnea 02/11/11    Sleep study: severe OSA- rec CPAP 20cm small  full face mask    History   Social History  . Marital Status: Single    Spouse Name: N/A    Number of Children: N/A  . Years of Education: N/A   Occupational History  . cna/med tech    Social History Main Topics  . Smoking status: Current Everyday Smoker -- 0.5 packs/day for 10 years    Types: Cigarettes  . Smokeless tobacco: Never Used  . Alcohol Use: Yes     occasional use  . Drug Use: No  . Sexually Active: Yes    Birth Control/ Protection: None   Other Topics Concern  . Not on file   Social History Narrative   Married-filing for divorced81 year old daughterWorks as cna/med tech    Past Surgical History  Procedure Date  . Cesarean section   . Ectopic pregnancy surgery     x 2  . Cyst removal   . Cleft palate repair     Family History  Problem Relation Age of Onset  . Cancer Father     oral cancer  . Heart attack Maternal Aunt     Allergies  Allergen Reactions  . Labetalol Nausea Only  . Aspirin     Knots in mouth   .  Ibuprofen     REACTION: knots in mouth    Current Outpatient Prescriptions on File Prior to Visit  Medication Sig Dispense Refill  . buPROPion (WELLBUTRIN SR) 150 MG 12 hr tablet Take 1 tablet (150 mg total) by mouth 2 (two) times daily.  60 tablet  1  . doxycycline (VIBRAMYCIN) 100 MG capsule Take 1 capsule (100 mg total) by mouth 2 (two) times daily.  20 capsule  0  . esomeprazole (NEXIUM) 40 MG capsule Take 1 capsule (40 mg total) by mouth daily before breakfast.  30 capsule  5  . glucose blood (ACCU-CHEK AVIVA) test strip Use to check blood sugar two times daily.  100 each  2  . HYDROcodone-acetaminophen (NORCO) 5-325 MG per tablet Take 2 tablets by mouth every 4 (four) hours as needed for pain.  16 tablet  0  . Lancets (ACCU-CHEK MULTICLIX) lancets Use as instructed to check blood sugar twice a day.       . metFORMIN (GLUCOPHAGE) 500 MG tablet Take 2 tablets (1,000 mg total) by mouth 2 (two) times daily with a meal.  60 tablet  3  . sitaGLIPtin (JANUVIA) 100  MG tablet Take 1 tablet (100 mg total) by mouth daily.  30 tablet  3  . vitamin E 100 UNIT capsule Take 100 Units by mouth daily.        . Colesevelam HCl (WELCHOL) 3.75 G PACK One packet in 4 ounces of water once daily  30 each  3    BP 120/76  Pulse 90  Temp(Src) 97.8 F (36.6 C) (Oral)  Resp 16  Ht 5\' 5"  (1.651 m)  Wt 329 lb (149.233 kg)  BMI 54.75 kg/m2  LMP 08/02/2011       Objective:   Physical Exam  Constitutional: She appears well-developed and well-nourished.  HENT:  Head: Normocephalic and atraumatic.  Eyes: No scleral icterus.  Neck: Normal range of motion. Neck supple.  Cardiovascular: Normal rate and regular rhythm.   No murmur heard. Pulmonary/Chest: Effort normal and breath sounds normal. No respiratory distress. She has no wheezes. She has no rales. She exhibits no tenderness.  Skin:       Small area of tender induration in right axilla with opening draining serous drainage.   Firm area of  induration on left inner thigh        Assessment & Plan:

## 2011-08-02 NOTE — Assessment & Plan Note (Addendum)
I recommended that she apply warm compresses twice daily to the affected areas to promote drainage.  Complete doxycycline.  Call if symptoms worsen or if they do not resolve.

## 2011-08-02 NOTE — Assessment & Plan Note (Signed)
This is being managed by Dr. Arther Abbott GYN.  Defer management to GYN.

## 2011-08-03 LAB — CBC WITH DIFFERENTIAL/PLATELET
Basophils Relative: 0 % (ref 0–1)
Eosinophils Absolute: 0.2 10*3/uL (ref 0.0–0.7)
MCH: 26.6 pg (ref 26.0–34.0)
MCHC: 31.6 g/dL (ref 30.0–36.0)
Neutrophils Relative %: 55 % (ref 43–77)
Platelets: 370 10*3/uL (ref 150–400)
RBC: 4.77 MIL/uL (ref 3.87–5.11)

## 2011-08-04 ENCOUNTER — Telehealth: Payer: Self-pay | Admitting: Family

## 2011-08-04 NOTE — Telephone Encounter (Signed)
Please call her and let her know that her diabetes is uncontrolled. I think that she needs to start insulin. I would like her to schedule a follow up apt please in the next 2 weeks.

## 2011-08-05 NOTE — Telephone Encounter (Signed)
Pt notified and scheduled f/u for 08/09/11 at 2:30pm.

## 2011-08-09 ENCOUNTER — Ambulatory Visit (INDEPENDENT_AMBULATORY_CARE_PROVIDER_SITE_OTHER): Payer: BC Managed Care – PPO | Admitting: Family

## 2011-08-09 ENCOUNTER — Encounter: Payer: Self-pay | Admitting: Family

## 2011-08-09 DIAGNOSIS — E119 Type 2 diabetes mellitus without complications: Secondary | ICD-10-CM

## 2011-08-09 MED ORDER — INSULIN GLARGINE 100 UNIT/ML ~~LOC~~ SOLN: 10.0000 [IU] | Freq: Every day | SUBCUTANEOUS | Status: DC

## 2011-08-09 MED ORDER — INSULIN PEN NEEDLE 29G X 12.7MM MISC: Status: DC

## 2011-08-09 NOTE — Patient Instructions (Addendum)
Please work hard on your diet, exercise and weight loss. Start Lantus 10 units every evening.   Increase lantus dose by 3 units every three days until your fasting AM sugar is <120.  Do not go over 30 units. Call us if you develop sugars <80. Follow up in 1 months.

## 2011-08-09 NOTE — Progress Notes (Signed)
  Subjective:    Patient ID: Catherine Espinoza, female    DOB: 1976-05-26, 35 y.o.   MRN: [id number]  HPI  Catherine Espinoza is a 35 yr old female who presents today to discuss her poorly controlled diabetes.  Her A1C is >10. She reports that she is not exercising.  She is drinking 1-2 regular mountain dews a day.  Pays some attention to her diet.     Review of Systems     Objective:   Physical Exam  Constitutional: She appears well-developed and well-nourished.       Morbidly obese AA female, pleasant.   Psychiatric: She has a normal mood and affect. Her speech is normal and behavior is normal. Thought content normal.          Assessment & Plan:

## 2011-08-11 NOTE — Assessment & Plan Note (Signed)
Deteriorated.  20 minutes spent with the patient today.  >50% of this time was spent counseling pt on the diabetic diet, exercise and weight loss.  In addition we will add Lantus 10 units with plans to titrate upward as outlined in pt instructions. We discussed signs/symptoms of hypoglycemia.  Pt  Was given a d heiabetic CBG log to record her readings and bring with her next visit. We discussed that if her diabetes does not improve we will need to refer her to Endo.

## 2011-08-12 ENCOUNTER — Other Ambulatory Visit: Payer: Self-pay | Admitting: *Deleted

## 2011-08-12 NOTE — Telephone Encounter (Signed)
Received fax from [facility] requesting clarification of Lantus rx on 08/09/11. Rx was sent for vial and pen needles were sent as well. Per Sandford Craze, NP rx should have been for solostar. Clarification called to Arlys John at [place].

## 2011-08-26 ENCOUNTER — Telehealth: Payer: Self-pay | Admitting: *Deleted

## 2011-08-26 MED ORDER — METFORMIN HCL 500 MG/5ML PO SOLN: 10.0000 mL | Freq: Two times a day (BID) | ORAL | Status: DC

## 2011-08-26 NOTE — Telephone Encounter (Signed)
Pt.notified

## 2011-08-26 NOTE — Telephone Encounter (Signed)
Pt called stating she is having great difficulty swallowing her metformin. States she is up to 20 units of Lantus and her fasting blood sugars are between 140-150s. She states she has not been taking her metformin regularly because she cannot swallow them. She states she has heard of a liquid form of metformin and wants to try that. Please advise.

## 2011-08-26 NOTE — Telephone Encounter (Signed)
I have changed her metformin from tabs to liquid and it has been sent to [facility].

## 2011-09-06 ENCOUNTER — Other Ambulatory Visit: Payer: Self-pay | Admitting: *Deleted

## 2011-09-06 NOTE — Telephone Encounter (Signed)
Received fax from [facility] ([facility]) for refill of glucometer test strips. Request signed and faxed back to [phone].

## 2011-09-08 ENCOUNTER — Encounter (HOSPITAL_BASED_OUTPATIENT_CLINIC_OR_DEPARTMENT_OTHER): Payer: Self-pay | Admitting: *Deleted

## 2011-09-08 ENCOUNTER — Emergency Department (HOSPITAL_BASED_OUTPATIENT_CLINIC_OR_DEPARTMENT_OTHER)
Admission: EM | Admit: 2011-09-08 | Discharge: 2011-09-08 | Disposition: A | Discharge status: Home / Self Care | Payer: BC Managed Care – PPO | Attending: Emergency Medicine | Admitting: Emergency Medicine

## 2011-09-08 ENCOUNTER — Telehealth: Payer: Self-pay | Admitting: Family

## 2011-09-08 DIAGNOSIS — F17200 Nicotine dependence, unspecified, uncomplicated: Secondary | ICD-10-CM | POA: Insufficient documentation

## 2011-09-08 DIAGNOSIS — J069 Acute upper respiratory infection, unspecified: Secondary | ICD-10-CM

## 2011-09-08 DIAGNOSIS — I10 Essential (primary) hypertension: Secondary | ICD-10-CM | POA: Insufficient documentation

## 2011-09-08 DIAGNOSIS — G4733 Obstructive sleep apnea (adult) (pediatric): Secondary | ICD-10-CM | POA: Insufficient documentation

## 2011-09-08 DIAGNOSIS — E119 Type 2 diabetes mellitus without complications: Secondary | ICD-10-CM | POA: Insufficient documentation

## 2011-09-08 MED ORDER — ALBUTEROL SULFATE HFA 108 (90 BASE) MCG/ACT IN AERS
2.0000 | INHALATION_SPRAY | RESPIRATORY_TRACT | Status: DC | PRN
  Administered 2011-09-08: 2 via RESPIRATORY_TRACT
  Filled 2011-09-08: qty 6.7

## 2011-09-08 MED ORDER — ALBUTEROL SULFATE HFA 108 (90 BASE) MCG/ACT IN AERS: 1.0000 | INHALATION_SPRAY | Freq: Four times a day (QID) | RESPIRATORY_TRACT | Status: DC | PRN

## 2011-09-08 MED ORDER — ACETAMINOPHEN-CODEINE 120-12 MG/5ML PO SUSP: 5.0000 mL | Freq: Four times a day (QID) | ORAL | Status: AC | PRN

## 2011-09-08 MED ORDER — OSELTAMIVIR PHOSPHATE 75 MG PO CAPS: 75.0000 mg | ORAL_CAPSULE | Freq: Two times a day (BID) | ORAL | Status: AC

## 2011-09-08 MED ORDER — ACETAMINOPHEN 325 MG PO TABS
650.0000 mg | ORAL_TABLET | Freq: Once | ORAL | Status: AC
  Administered 2011-09-08: 650 mg via ORAL
  Filled 2011-09-08: qty 2

## 2011-09-08 NOTE — Telephone Encounter (Signed)
Pls call pt and arrange an ED follow up visit early this week.

## 2011-09-08 NOTE — ED Notes (Signed)
Fever, cough, chills, and aching all over x 2 days. Started off with sore throat and ear pain.

## 2011-09-08 NOTE — ED Provider Notes (Signed)
History     CSN: [id number] Arrival date & time: 09/08/2011  3:26 AM   First MD Initiated Contact with Patient 09/08/11 0320      Chief Complaint  Patient presents with  . Fever    (Consider location/radiation/quality/duration/timing/severity/associated sxs/prior treatment) [name] presenting with fever. The history is provided by the patient.  Fever Primary symptoms of the febrile illness include cough and myalgias. Primary symptoms do not include headaches, shortness of breath, abdominal pain, vomiting, dysuria or rash. The current episode started yesterday. This is a new problem. The problem has been gradually worsening.  Associated with: Husband at home is sick. Did get a flu shot this year. Risk factors for febrile illness include diabetes mellitus.  dry cough with sore throat and body aches. No alleviating factors at home. Has runny nose and congestion and feels congested in her chest. No shortness of breath or leg swelling. The recorded fevers. Moderate in severity. Quality of pain sharp and sore.  Past Medical History  Diagnosis Date  . Unspecified essential hypertension   . Proteinuria   . Excessive or frequent menstruation   . Anemia, unspecified   . Type II or unspecified type diabetes mellitus without mention of complication, not stated as uncontrolled   . Morbid obesity   . Obstructive sleep apnea 02/11/11    Sleep study: severe OSA- rec CPAP 20cm small  full face mask    Past Surgical History  Procedure Date  . Cesarean section   . Ectopic pregnancy surgery     x 2  . Cyst removal   . Cleft palate repair     Family History  Problem Relation Age of Onset  . Cancer Father     oral cancer  . Heart attack Maternal Aunt     History  Substance Use Topics  . Smoking status: Current Everyday Smoker -- 0.5 packs/day for 10 years    Types: Cigarettes  . Smokeless tobacco: Never Used  . Alcohol Use: Yes     occasional use    OB History      Grav Para Term Preterm Abortions TAB SAB Ect Mult Living                  Review of Systems  Constitutional: Negative for chills.  HENT: Positive for sore throat. Negative for neck pain and neck stiffness.   Eyes: Negative for pain.  Respiratory: Positive for cough. Negative for shortness of breath.   Gastrointestinal: Negative for vomiting and abdominal pain.  Genitourinary: Negative for dysuria.  Musculoskeletal: Positive for myalgias. Negative for back pain.  Skin: Negative for rash.  Neurological: Negative for headaches.  All other systems reviewed and are negative.    Allergies  Labetalol; Aspirin; and Ibuprofen  Home Medications   Current Outpatient Rx  Name Route Sig Dispense Refill  . BUPROPION HCL ER (SR) 150 MG PO TB12 Oral Take 1 tablet (150 mg total) by mouth 2 (two) times daily. 60 tablet 1  . COLESEVELAM HCL 3.75 G PO PACK  One packet in 4 ounces of water once daily 30 each 3    [place] # [id number] Supervising Physician Dr. Thomos Lemons ...  . ESOMEPRAZOLE MAGNESIUM 40 MG PO CPDR Oral Take 1 capsule (40 mg total) by mouth daily before breakfast. 30 capsule 5    [place] # [id number] Supervising Physician Dr. Thomos Lemons ...  . GLUCOSE BLOOD VI STRP  Use to check blood sugar two times daily. 100  each 2  . INSULIN GLARGINE 100 UNIT/ML  SOLN Subcutaneous Inject 10 Units into the skin at bedtime.      . INSULIN PEN NEEDLE 29G X 12.7MM MISC  Use with lantus solostar for insulin injections. 100 each 1  . ACCU-CHEK MULTICLIX LANCETS MISC  Use as instructed to check blood sugar twice a day.     Marland Kitchen LYSTEDA 650 MG PO TABS Oral Take 650 mg by mouth Every 6 hours.    Marland Kitchen METFORMIN HCL 500 MG/5ML PO SOLN Oral Take 10 mLs (1,000 mg total) by mouth 2 (two) times daily. 300 mL 2  . SITAGLIPTIN PHOSPHATE 100 MG PO TABS Oral Take 1 tablet (100 mg total) by mouth daily. 30 tablet 3  . TERBINAFINE HCL 250 MG PO TABS Oral Take 1 tablet (250 mg total) by mouth daily. 30 tablet 2  . VITAMIN E 100  UNITS PO CAPS Oral Take 100 Units by mouth daily.        BP 115/64  Pulse 116  Temp(Src) 98.5 F (36.9 C) (Oral)  Resp 18  Ht 5\' 5"  (1.651 m)  Wt 320 lb (145.151 kg)  BMI 53.25 kg/m2  SpO2 97%  Physical Exam  Constitutional: She is oriented to person, place, and time. She appears well-developed and well-nourished.  HENT:  Head: Normocephalic and atraumatic.  Mouth/Throat: No oropharyngeal exudate.       Nasal congestion.  Eyes: Conjunctivae and EOM are normal. Pupils are equal, round, and reactive to light.  Neck: Trachea normal. Neck supple. No thyromegaly present.  Cardiovascular: Normal rate, regular rhythm, S1 normal, S2 normal and normal pulses.     No systolic murmur is present   No diastolic murmur is present  Pulses:      Radial pulses are 2+ on the right side, and 2+ on the left side.  Pulmonary/Chest: Effort normal and breath sounds normal. She has no wheezes. She has no rhonchi. She has no rales. She exhibits no tenderness.  Abdominal: Soft. Normal appearance and bowel sounds are normal. There is no tenderness. There is no CVA tenderness and negative Murphy's sign.  Musculoskeletal:       BLE:s Calves nontender, no cords or erythema, negative Homans sign  Neurological: She is alert and oriented to person, place, and time. She has normal strength. No cranial nerve deficit or sensory deficit. GCS eye subscore is 4. GCS verbal subscore is 5. GCS motor subscore is 6.  Skin: Skin is warm and dry. No rash noted. She is not diaphoretic.  Psychiatric: Her speech is normal.       Cooperative and appropriate    ED Course  Procedures (including critical care time)  Albuterol and Tylenol   MDM  Presentation consistent with flulike syndrome. Sick contacts at home with multiple symptoms as above. Albuterol inhaler improve symptoms some the patient sent home with the same. Tamiflu initiated for symptoms less than 48 hours onset. Patient has close primary care followup and  states understanding strict return precautions for any worsening condition. Stable for discharge home. No findings on pulmonary exam to suggest indication for x-ray at this time.         Sunnie Nielsen, MD 09/08/11 [phone]

## 2011-09-09 ENCOUNTER — Ambulatory Visit: Payer: BC Managed Care – PPO | Admitting: Family

## 2011-09-09 ENCOUNTER — Ambulatory Visit (INDEPENDENT_AMBULATORY_CARE_PROVIDER_SITE_OTHER): Payer: BC Managed Care – PPO | Admitting: Family

## 2011-09-09 ENCOUNTER — Telehealth: Payer: Self-pay | Admitting: Family

## 2011-09-09 ENCOUNTER — Telehealth: Payer: Self-pay | Admitting: *Deleted

## 2011-09-09 ENCOUNTER — Ambulatory Visit (HOSPITAL_BASED_OUTPATIENT_CLINIC_OR_DEPARTMENT_OTHER)
Admission: RE | Admit: 2011-09-09 | Discharge: 2011-09-09 | Disposition: A | Discharge status: Home / Self Care | Payer: BC Managed Care – PPO | Source: Ambulatory Visit | Attending: Family | Admitting: Family

## 2011-09-09 ENCOUNTER — Encounter: Payer: Self-pay | Admitting: Family

## 2011-09-09 VITALS — BP 116/70 | HR 78 | Temp 97.8°F | Resp 16 | Ht 65.0 in | Wt 329.0 lb

## 2011-09-09 DIAGNOSIS — R059 Cough, unspecified: Secondary | ICD-10-CM

## 2011-09-09 DIAGNOSIS — Z0189 Encounter for other specified special examinations: Secondary | ICD-10-CM

## 2011-09-09 DIAGNOSIS — R05 Cough: Secondary | ICD-10-CM

## 2011-09-09 DIAGNOSIS — J189 Pneumonia, unspecified organism: Secondary | ICD-10-CM

## 2011-09-09 DIAGNOSIS — E119 Type 2 diabetes mellitus without complications: Secondary | ICD-10-CM

## 2011-09-09 DIAGNOSIS — R509 Fever, unspecified: Secondary | ICD-10-CM | POA: Insufficient documentation

## 2011-09-09 MED ORDER — MOXIFLOXACIN HCL 400 MG PO TABS: 400.0000 mg | ORAL_TABLET | Freq: Every day | ORAL | Status: DC

## 2011-09-09 MED ORDER — LEVOFLOXACIN 500 MG PO TABS: 500.0000 mg | ORAL_TABLET | Freq: Every day | ORAL | Status: AC

## 2011-09-09 MED ORDER — HYDROCOD POLST-CHLORPHEN POLST 10-8 MG/5ML PO LQCR: 5.0000 mL | Freq: Two times a day (BID) | ORAL | Status: DC | PRN

## 2011-09-09 NOTE — Telephone Encounter (Signed)
Called pharmacy- will have her start levaquin instead.  Co-pay is 9 dollars.  Please call pt and have her start Levaquin this evening.

## 2011-09-09 NOTE — Assessment & Plan Note (Addendum)
There is hazy left base retrocardiac atelectasis or infiltrate with increased bronchial markings. Early pneumonia cannot be excluded. Mild right infrahilar increased bronchial markings. Will plan to treat with levaquin.

## 2011-09-09 NOTE — Telephone Encounter (Signed)
Patient has an appt for today.  

## 2011-09-09 NOTE — Patient Instructions (Addendum)
Please complete your chest x-ray on the first floor.  Do not drive while taking tussionex as it may cause drowsiness. Use delsym as needed for cough during the day and you can use the tussionex at bedtime.  Call if your symptoms worsen, if fever >101, or if you are not feeling better in 2-3 days.

## 2011-09-09 NOTE — Progress Notes (Signed)
Subjective:    Patient ID: Catherine Espinoza, female    DOB: 10-Mar-1976, 35 y.o.   MRN: [id number]  HPI  Catherine Espinoza is a 35 yr old female who presents today for ED follow up.  She was seen on 12/9 for flu-like illness and was started on Tamiflu. She reports that she still feels bad, "feels like something is stabbing me in the throat." (when she coughs).  She started tamiflu.  She reports tmax 100.9 last night.  Cough is dry.    DM2- reports that her sugars are "getting better." Notes that her sugars have been about 130 fasting.       Review of Systems    see HPI  Past Medical History  Diagnosis Date  . Unspecified essential hypertension   . Proteinuria   . Excessive or frequent menstruation   . Anemia, unspecified   . Type II or unspecified type diabetes mellitus without mention of complication, not stated as uncontrolled   . Morbid obesity   . Obstructive sleep apnea 02/11/11    Sleep study: severe OSA- rec CPAP 20cm small  full face mask    History   Social History  . Marital Status: Single    Spouse Name: N/A    Number of Children: N/A  . Years of Education: N/A   Occupational History  . cna/med tech    Social History Main Topics  . Smoking status: Current Everyday Smoker -- 0.5 packs/day for 10 years    Types: Cigarettes  . Smokeless tobacco: Never Used  . Alcohol Use: Yes     occasional use  . Drug Use: No  . Sexually Active: Yes    Birth Control/ Protection: None   Other Topics Concern  . 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 Cesarean section   . Ectopic pregnancy surgery     x 2  . Cyst removal   . Cleft palate repair     Family History  Problem Relation Age of Onset  . Cancer Father     oral cancer  . Heart attack Maternal Aunt     Allergies  Allergen Reactions  . Labetalol Nausea Only  . Aspirin     Knots in mouth   .  Ibuprofen     REACTION: knots in mouth    Current Outpatient Prescriptions on File Prior to Visit  Medication Sig Dispense Refill  . acetaminophen-codeine 120-12 MG/5ML suspension Take 5 mLs by mouth every 6 (six) hours as needed (Take at bedtime as needed for cough and discomfort. No driving while medicated).  60 mL  0  . albuterol (PROVENTIL HFA;VENTOLIN HFA) 108 (90 BASE) MCG/ACT inhaler Inhale 1-2 puffs into the lungs every 6 (six) hours as needed for wheezing.  1 Inhaler  0  . esomeprazole (NEXIUM) 40 MG capsule Take 1 capsule (40 mg total) by mouth daily before breakfast.  30 capsule  5  . glucose blood (ACCU-CHEK AVIVA) test strip Use to check blood sugar two times daily.  100 each  2  . insulin glargine (LANTUS SOLOSTAR) 100 UNIT/ML injection Inject 23 Units into the skin at bedtime.       . Insulin Pen Needle (BD ULTRA-FINE PEN NEEDLES) 29G X 12.7MM MISC Use with lantus solostar for insulin injections.  100 each  1  . Lancets (ACCU-CHEK MULTICLIX) lancets Use as instructed to  check blood sugar twice a day.       . Metformin HCl 500 MG/5ML SOLN Take 10 mLs (1,000 mg total) by mouth 2 (two) times daily.  300 mL  2  . oseltamivir (TAMIFLU) 75 MG capsule Take 1 capsule (75 mg total) by mouth every 12 (twelve) hours.  10 capsule  0  . sitaGLIPtin (JANUVIA) 100 MG tablet Take 1 tablet (100 mg total) by mouth daily.  30 tablet  3  . terbinafine (LAMISIL) 250 MG tablet Take 1 tablet (250 mg total) by mouth daily.  30 tablet  2  . vitamin E 100 UNIT capsule Take 100 Units by mouth daily.        . Colesevelam HCl (WELCHOL) 3.75 G PACK One packet in 4 ounces of water once daily  30 each  3    BP 116/70  Pulse 78  Temp(Src) 97.8 F (36.6 C) (Oral)  Resp 16  Ht 5\' 5"  (1.651 m)  Wt 329 lb (149.233 kg)  BMI 54.75 kg/m2    Objective:   Physical Exam        Assessment & Plan:

## 2011-09-09 NOTE — Telephone Encounter (Signed)
Received call from pharmacy that pt is requesting a cheaper alternative (generic substitute) for Avelox. Pt cost is $40. Please advise.

## 2011-09-09 NOTE — Telephone Encounter (Signed)
Reviewed CXR ? Early infiltrate.  Spoke with patient and instructed her to start Avelox once daily and follow up in the office on Friday before the weekend.  She is scheduled for Uterine ablation on Friday and I advised her to let her GYN know that she is being treated for pneumonia and to postpone for 1-2 weeks until she is feeling better.  She verbalizes understanding. She requests rx be sent to pharmacy downstairs. I have cancelled rx at [facility].

## 2011-09-09 NOTE — Telephone Encounter (Signed)
Left message on machine to return my call. 

## 2011-09-10 ENCOUNTER — Telehealth: Payer: Self-pay | Admitting: Family

## 2011-09-10 NOTE — Assessment & Plan Note (Signed)
She continues to titrate lantus upward. Sugars are improving.

## 2011-09-10 NOTE — Telephone Encounter (Signed)
Could you pls call pt and arrange a 1 week follow up apt?

## 2011-09-10 NOTE — Telephone Encounter (Signed)
Pt was notified however she is requesting that rx be sent to [facility] pharmacy. Ben (pharmacist) will contact [facility] to get Rx from them.

## 2011-09-11 NOTE — Telephone Encounter (Signed)
Left detailed message on pt's voicemail to call and arrange appt.

## 2011-09-12 NOTE — Telephone Encounter (Signed)
Spoke with pt and scheduled f/u for 10/16/10 at 10:30.

## 2011-09-16 ENCOUNTER — Ambulatory Visit: Payer: BC Managed Care – PPO | Admitting: Family

## 2011-09-16 DIAGNOSIS — Z0289 Encounter for other administrative examinations: Secondary | ICD-10-CM

## 2011-11-04 ENCOUNTER — Ambulatory Visit: Payer: BC Managed Care – PPO | Admitting: Family

## 2011-11-04 DIAGNOSIS — Z0289 Encounter for other administrative examinations: Secondary | ICD-10-CM

## 2011-11-16 ENCOUNTER — Encounter (HOSPITAL_BASED_OUTPATIENT_CLINIC_OR_DEPARTMENT_OTHER): Payer: Self-pay

## 2011-11-16 ENCOUNTER — Emergency Department (HOSPITAL_BASED_OUTPATIENT_CLINIC_OR_DEPARTMENT_OTHER)
Admission: EM | Admit: 2011-11-16 | Discharge: 2011-11-16 | Disposition: A | Discharge status: Home / Self Care | Payer: BC Managed Care – PPO | Attending: Emergency Medicine | Admitting: Emergency Medicine

## 2011-11-16 DIAGNOSIS — G4733 Obstructive sleep apnea (adult) (pediatric): Secondary | ICD-10-CM | POA: Insufficient documentation

## 2011-11-16 DIAGNOSIS — J329 Chronic sinusitis, unspecified: Secondary | ICD-10-CM

## 2011-11-16 DIAGNOSIS — E119 Type 2 diabetes mellitus without complications: Secondary | ICD-10-CM | POA: Insufficient documentation

## 2011-11-16 DIAGNOSIS — Z79899 Other long term (current) drug therapy: Secondary | ICD-10-CM | POA: Insufficient documentation

## 2011-11-16 DIAGNOSIS — R5381 Other malaise: Secondary | ICD-10-CM | POA: Insufficient documentation

## 2011-11-16 DIAGNOSIS — I10 Essential (primary) hypertension: Secondary | ICD-10-CM | POA: Insufficient documentation

## 2011-11-16 MED ORDER — AMOXICILLIN 500 MG PO CAPS: 500.0000 mg | ORAL_CAPSULE | Freq: Three times a day (TID) | ORAL | Status: AC

## 2011-11-16 NOTE — Discharge Instructions (Signed)

## 2011-11-16 NOTE — ED Notes (Addendum)
Patient took 25mg  benadryl last night, no meds today, able to drink fluids, head congestion, throat hurts,

## 2011-11-16 NOTE — ED Provider Notes (Signed)
History     CSN: [id number]  Arrival date & time 11/16/11  1042   First MD Initiated Contact with Patient 11/16/11 1206      Chief Complaint  Patient presents with  . Influenza    (Consider location/radiation/quality/duration/timing/severity/associated sxs/prior treatment) Patient is a 36 y.o. female presenting with flu symptoms. The history is provided by the patient. No language interpreter was used.  Influenza This is a new problem. The current episode started in the past 7 days. The problem occurs constantly. The problem has been gradually worsening. Associated symptoms include fatigue, a fever, a sore throat and swollen glands. Pertinent negatives include no abdominal pain. The symptoms are aggravated by nothing. She has tried nothing for the symptoms.    Past Medical History  Diagnosis Date  . Unspecified essential hypertension   . Proteinuria   . Excessive or frequent menstruation   . Anemia, unspecified   . Type II or unspecified type diabetes mellitus without mention of complication, not stated as uncontrolled   . Morbid obesity   . Obstructive sleep apnea 02/11/11    Sleep study: severe OSA- rec CPAP 20cm small  full face mask    Past Surgical History  Procedure Date  . Cesarean section   . Ectopic pregnancy surgery     x 2  . Cyst removal   . Cleft palate repair     Family History  Problem Relation Age of Onset  . Cancer Father     oral cancer  . Heart attack Maternal Aunt     History  Substance Use Topics  . Smoking status: Current Everyday Smoker -- 0.5 packs/day for 10 years    Types: Cigarettes  . Smokeless tobacco: Never Used  . Alcohol Use: Yes     occasional use    OB History    Grav Para Term Preterm Abortions TAB SAB Ect Mult Living                  Review of Systems  Constitutional: Positive for fever and fatigue.  HENT: Positive for sore throat.   Gastrointestinal: Negative for abdominal pain.    Allergies  Labetalol; Aspirin;  and Ibuprofen  Home Medications   Current Outpatient Rx  Name Route Sig Dispense Refill  . ALBUTEROL SULFATE HFA 108 (90 BASE) MCG/ACT IN AERS Inhalation Inhale 1-2 puffs into the lungs every 6 (six) hours as needed for wheezing. 1 Inhaler 0  . HYDROCOD POLST-CPM POLST ER 10-8 MG/5ML PO LQCR Oral Take 5 mLs by mouth every 12 (twelve) hours as needed. 140 mL 0  . COLESEVELAM HCL 3.75 G PO PACK  One packet in 4 ounces of water once daily 30 each 3    [place] # [id number] Supervising Physician Dr. Thomos Lemons ...  . ESOMEPRAZOLE MAGNESIUM 40 MG PO CPDR Oral Take 1 capsule (40 mg total) by mouth daily before breakfast. 30 capsule 5    [place] # [id number] Supervising Physician Dr. Thomos Lemons ...  . GLUCOSE BLOOD VI STRP  Use to check blood sugar two times daily. 100 each 2  . HYDROCODONE-ACETAMINOPHEN 7.5-750 MG PO TABS  Take 1 tablet 1 hour prior to procedure and then as needed for pain.     . INSULIN GLARGINE 100 UNIT/ML [place] SOLN Subcutaneous Inject 23 Units into the skin at bedtime.     . INSULIN PEN NEEDLE 29G X 12.7MM MISC  Use with lantus solostar for insulin injections. 100 each 1  . ACCU-CHEK MULTICLIX LANCETS MISC  Use as instructed to check blood sugar twice a day.     Marland Kitchen LORAZEPAM 1 MG PO TABS  Take 1-2 tablets 1 hour before procedure.     Marland Kitchen METFORMIN HCL 500 MG/5ML PO SOLN Oral Take 10 mLs (1,000 mg total) by mouth 2 (two) times daily. 300 mL 2  . SITAGLIPTIN PHOSPHATE 100 MG PO TABS Oral Take 1 tablet (100 mg total) by mouth daily. 30 tablet 3  . TERBINAFINE HCL 250 MG PO TABS Oral Take 1 tablet (250 mg total) by mouth daily. 30 tablet 2  . VITAMIN E 100 UNITS PO CAPS Oral Take 100 Units by mouth daily.        BP 145/86  Pulse 119  Temp(Src) 98.6 F (37 C) (Oral)  Resp 20  Ht 5\' 6"  (1.676 m)  Wt 325 lb (147.419 kg)  BMI 52.46 kg/m2  SpO2 97%  LMP 09/13/2011  Physical Exam  Nursing note and vitals reviewed. Constitutional: She appears well-developed and well-nourished.  HENT:  Head:  Normocephalic and atraumatic.  Right Ear: External ear normal.  Left Ear: External ear normal.  Nose: Nose normal.  Mouth/Throat: Oropharynx is clear and moist.       Tender maxillary sinuses,  Swollen lymph noded  Eyes: Conjunctivae and EOM are normal. Pupils are equal, round, and reactive to light.  Neck: Normal range of motion. Neck supple.  Cardiovascular: Normal rate and normal heart sounds.   Pulmonary/Chest: Effort normal.  Abdominal: Soft. Bowel sounds are normal.  Musculoskeletal: Normal range of motion.  Neurological: She is alert.  Skin: Skin is warm.  Psychiatric: She has a normal mood and affect.    ED Course  Procedures (including critical care time)  Labs Reviewed - No data to display No results found.   No diagnosis found.    MDM    Pt given rs for amoxicillian.  I advised see Peggyann Juba NP for recheck this week     Langston Masker, [place] 11/16/11 1236

## 2011-11-16 NOTE — ED Notes (Signed)
Pt with congestion of head, runny nose, no fever, body aches, chills, abdominal pain, vomiting, nausea.  No diarrhea.  Pt works at [employer] where there is outbreak of norovirus.

## 2011-11-16 NOTE — ED Provider Notes (Signed)
Evaluation and management procedures were performed by the PA/NP under my supervision/collaboration.   Kashis Penley D Nicholaus Steinke, MD 11/16/11 2040 

## 2012-08-12 ENCOUNTER — Ambulatory Visit (INDEPENDENT_AMBULATORY_CARE_PROVIDER_SITE_OTHER): Payer: BC Managed Care – PPO | Admitting: Family

## 2012-08-12 ENCOUNTER — Ambulatory Visit: Payer: BC Managed Care – PPO | Admitting: Family

## 2012-08-12 ENCOUNTER — Encounter: Payer: Self-pay | Admitting: Family

## 2012-08-12 VITALS — BP 126/90 | HR 104 | Temp 98.3°F | Resp 18 | Ht 65.0 in | Wt 325.1 lb

## 2012-08-12 DIAGNOSIS — D649 Anemia, unspecified: Secondary | ICD-10-CM

## 2012-08-12 DIAGNOSIS — E119 Type 2 diabetes mellitus without complications: Secondary | ICD-10-CM

## 2012-08-12 DIAGNOSIS — I10 Essential (primary) hypertension: Secondary | ICD-10-CM

## 2012-08-12 DIAGNOSIS — F17200 Nicotine dependence, unspecified, uncomplicated: Secondary | ICD-10-CM

## 2012-08-12 DIAGNOSIS — Z23 Encounter for immunization: Secondary | ICD-10-CM

## 2012-08-12 DIAGNOSIS — E785 Hyperlipidemia, unspecified: Secondary | ICD-10-CM

## 2012-08-12 LAB — HEPATIC FUNCTION PANEL
ALT: 17 U/L (ref 0–35)
AST: 11 U/L (ref 0–37)
Alkaline Phosphatase: 70 U/L (ref 39–117)
Bilirubin, Direct: 0.1 mg/dL (ref 0.0–0.3)
Indirect Bilirubin: 0.2 mg/dL (ref 0.0–0.9)
Total Bilirubin: 0.3 mg/dL (ref 0.3–1.2)

## 2012-08-12 LAB — BASIC METABOLIC PANEL
BUN: 9 mg/dL (ref 6–23)
Chloride: 99 mEq/L (ref 96–112)
Potassium: 4.5 mEq/L (ref 3.5–5.3)
Sodium: 135 mEq/L (ref 135–145)

## 2012-08-12 MED ORDER — METFORMIN HCL 1000 MG PO TABS: 1000.0000 mg | ORAL_TABLET | Freq: Two times a day (BID) | ORAL | Status: DC

## 2012-08-12 MED ORDER — ESOMEPRAZOLE MAGNESIUM 40 MG PO CPDR: 40.0000 mg | DELAYED_RELEASE_CAPSULE | Freq: Every day | ORAL | Status: DC

## 2012-08-12 MED ORDER — INSULIN DETEMIR 100 UNIT/ML ~~LOC~~ SOLN: SUBCUTANEOUS | Status: DC

## 2012-08-12 MED ORDER — SITAGLIPTIN PHOSPHATE 100 MG PO TABS: 100.0000 mg | ORAL_TABLET | Freq: Every day | ORAL | Status: DC

## 2012-08-12 NOTE — Patient Instructions (Addendum)
Please complete your blood work prior to leaving. Please schedule a follow up appointment in 3 months.  

## 2012-08-12 NOTE — Progress Notes (Signed)
Subjective:    Patient ID: Catherine Espinoza, female    DOB: 09-09-1976, 36 y.o.   MRN: [id number]  HPI  Catherine Espinoza is a 36 yr old female who presents today for follow up.    Anemia- reports that she recently underwent Endometrial ablation. She is having no further menstrual bleeding.   DM2- she reports that her sugars have been 120's to 130's in the AM. She has been out of her medication x 2 months.  Reports that her insurance will not take lantus any longer.     Review of Systems See HPI  Past Medical History  Diagnosis Date  . Unspecified essential hypertension   . Proteinuria   . Excessive or frequent menstruation   . Anemia, unspecified   . Type II or unspecified type diabetes mellitus without mention of complication, not stated as uncontrolled   . Morbid obesity   . Obstructive sleep apnea 02/11/11    Sleep study: severe OSA- rec CPAP 20cm small  full face mask    History   Social History  . Marital Status: Single    Spouse Name: N/A    Number of Children: N/A  . Years of Education: N/A   Occupational History  . cna/med tech    Social History Main Topics  . Smoking status: Current Every Day Smoker -- 0.5 packs/day for 10 years    Types: Cigarettes  . Smokeless tobacco: Never Used  . Alcohol Use: Yes     Comment: occasional use  . Drug Use: No  . Sexually Active: Yes    Birth Control/ Protection: None   Other Topics Concern  . 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 Cesarean section   . Ectopic pregnancy surgery     x 2  . Cyst removal   . Cleft palate repair     Family History  Problem Relation Age of Onset  . Cancer Father     oral cancer  . Heart attack Maternal Aunt     Allergies  Allergen Reactions  . Labetalol Nausea Only  . Aspirin     Knots in mouth   . Ibuprofen     REACTION: knots in mouth    Current Outpatient  Prescriptions on File Prior to Visit  Medication Sig Dispense Refill  . albuterol (PROVENTIL HFA;VENTOLIN HFA) 108 (90 BASE) MCG/ACT inhaler Inhale 1-2 puffs into the lungs every 6 (six) hours as needed for wheezing.  1 Inhaler  0  . esomeprazole (NEXIUM) 40 MG capsule Take 1 capsule (40 mg total) by mouth daily before breakfast.  30 capsule  5  . glucose blood (ACCU-CHEK AVIVA) test strip Use to check blood sugar two times daily.  100 each  2  . Insulin Pen Needle (BD ULTRA-FINE PEN NEEDLES) 29G X 12.7MM MISC Use with lantus solostar for insulin injections.  100 each  1  . Lancets (ACCU-CHEK MULTICLIX) lancets Use as instructed to check blood sugar twice a day.       . vitamin E 100 UNIT capsule Take 100 Units by mouth daily.        . insulin detemir (LEVEMIR) 100 UNIT/ML injection 23 units sq once daily  10 mL  12  . insulin glargine (LANTUS SOLOSTAR) 100 UNIT/ML injection Inject 23 Units into the skin at bedtime.       . sitaGLIPtin (JANUVIA) 100  MG tablet Take 1 tablet (100 mg total) by mouth daily.  30 tablet  3    BP 126/90  Pulse 104  Temp 98.3 F (36.8 C) (Oral)  Resp 18  Ht 5\' 5"  (1.651 m)  Wt 325 lb 1.9 oz (147.473 kg)  BMI 54.10 kg/m2  SpO2 96%       Objective:   Physical Exam  Constitutional: She appears well-developed and well-nourished.  Cardiovascular: Normal rate and regular rhythm.   No murmur heard. Pulmonary/Chest: Effort normal and breath sounds normal. No respiratory distress. She has no wheezes. She has no rales. She exhibits no tenderness.  Musculoskeletal: She exhibits no edema.  Psychiatric: She has a normal mood and affect. Her behavior is normal. Judgment and thought content normal.          Assessment & Plan:

## 2012-08-13 LAB — HEMOGLOBIN A1C
Hgb A1c MFr Bld: 11 % — ABNORMAL HIGH (ref <5.7)
Mean Plasma Glucose: 269 mg/dL — ABNORMAL HIGH (ref <117)

## 2012-08-13 LAB — MICROALBUMIN / CREATININE URINE RATIO: Microalb, Ur: 2.23 mg/dL — ABNORMAL HIGH (ref 0.00–1.89)

## 2012-08-14 ENCOUNTER — Telehealth: Payer: Self-pay | Admitting: *Deleted

## 2012-08-14 MED ORDER — "INSULIN SYRINGE 29G X 1/2"" 1 ML MISC": Status: DC

## 2012-08-14 NOTE — Assessment & Plan Note (Signed)
Deteriorated.  Resume meds.  Will substitute levemir for lantus due to insurance coverage changes.  Resume metformin and Januvia. Obtain urine microalbumin, A1C.  She is up to date on flu shot.

## 2012-08-14 NOTE — Telephone Encounter (Signed)
Received call from pharmacy, able to get Levemir to go through on insurance. Needs rx for syringes. Rx sent.

## 2012-08-14 NOTE — Assessment & Plan Note (Addendum)
BP Readings from Last 3 Encounters:  08/12/12 126/90  11/16/11 145/86  09/09/11 116/70   BP looks ok today. She is not currently taking HCTZ as before.  Will monitor.

## 2012-08-14 NOTE — Assessment & Plan Note (Signed)
She is s/p Ablation.  Plan to check CBC next visit.

## 2012-08-14 NOTE — Assessment & Plan Note (Signed)
We discussed importance of cessation.

## 2012-09-04 ENCOUNTER — Telehealth: Payer: Self-pay | Admitting: Family

## 2012-09-04 NOTE — Telephone Encounter (Signed)
Opened in error

## 2012-09-09 ENCOUNTER — Telehealth: Payer: Self-pay | Admitting: *Deleted

## 2012-09-09 NOTE — Telephone Encounter (Signed)
Signed.

## 2012-09-09 NOTE — Telephone Encounter (Signed)
Received fax from [facility] ph)[phone] requesting order for Accu-chek aviva test strips, #100 test 3 times daily and refills x 1 yr. Order forwarded to Provider for signature.

## 2012-09-11 NOTE — Telephone Encounter (Signed)
Completed form was faxed to [phone] on 09/10/12 @ 3:22pm.

## 2012-10-06 ENCOUNTER — Encounter: Payer: Self-pay | Admitting: Family

## 2012-11-16 ENCOUNTER — Ambulatory Visit: Payer: BC Managed Care – PPO | Admitting: Family

## 2012-11-17 ENCOUNTER — Encounter: Payer: Self-pay | Admitting: Family

## 2012-11-17 ENCOUNTER — Ambulatory Visit (INDEPENDENT_AMBULATORY_CARE_PROVIDER_SITE_OTHER): Payer: BC Managed Care – PPO | Admitting: Family

## 2012-11-17 VITALS — BP 120/80 | HR 116 | Temp 98.3°F | Resp 18 | Ht 65.0 in | Wt 325.0 lb

## 2012-11-17 DIAGNOSIS — J069 Acute upper respiratory infection, unspecified: Secondary | ICD-10-CM

## 2012-11-17 DIAGNOSIS — Z20828 Contact with and (suspected) exposure to other viral communicable diseases: Secondary | ICD-10-CM

## 2012-11-17 DIAGNOSIS — E119 Type 2 diabetes mellitus without complications: Secondary | ICD-10-CM

## 2012-11-17 DIAGNOSIS — I10 Essential (primary) hypertension: Secondary | ICD-10-CM

## 2012-11-17 DIAGNOSIS — IMO0001 Reserved for inherently not codable concepts without codable children: Secondary | ICD-10-CM

## 2012-11-17 DIAGNOSIS — Z205 Contact with and (suspected) exposure to viral hepatitis: Secondary | ICD-10-CM

## 2012-11-17 DIAGNOSIS — D649 Anemia, unspecified: Secondary | ICD-10-CM

## 2012-11-17 LAB — CBC WITH DIFFERENTIAL/PLATELET
Basophils Absolute: 0 10*3/uL (ref 0.0–0.1)
HCT: 45.7 % (ref 36.0–46.0)
Lymphocytes Relative: 33 % (ref 12–46)
Lymphs Abs: 1.6 10*3/uL (ref 0.7–4.0)
MCV: 87.4 fL (ref 78.0–100.0)
Monocytes Absolute: 0.5 10*3/uL (ref 0.1–1.0)
Neutro Abs: 2.5 10*3/uL (ref 1.7–7.7)
Platelets: 197 10*3/uL (ref 150–400)
RBC: 5.23 MIL/uL — ABNORMAL HIGH (ref 3.87–5.11)
RDW: 14.2 % (ref 11.5–15.5)
WBC: 4.8 10*3/uL (ref 4.0–10.5)

## 2012-11-17 LAB — BASIC METABOLIC PANEL
CO2: 28 mEq/L (ref 19–32)
Chloride: 98 mEq/L (ref 96–112)
Creat: 0.7 mg/dL (ref 0.50–1.10)
Sodium: 135 mEq/L (ref 135–145)

## 2012-11-17 MED ORDER — BENZONATATE 100 MG PO CAPS: 100.0000 mg | ORAL_CAPSULE | Freq: Three times a day (TID) | ORAL | Status: DC | PRN

## 2012-11-17 MED ORDER — INSULIN GLARGINE 100 UNIT/ML ~~LOC~~ SOLN: SUBCUTANEOUS | Status: DC

## 2012-11-17 MED ORDER — INSULIN ASPART 100 UNIT/ML ~~LOC~~ SOLN: SUBCUTANEOUS | Status: DC

## 2012-11-17 NOTE — Progress Notes (Signed)
Subjective:    Patient ID: Catherine Espinoza, female    DOB: Mar 20, 1976, 37 y.o.   MRN: [id number]  HPI  Cough- started yesterday.  Worsened overnight- felt like a train hit me.  Denies sore throat or nasal congestion.  Reports temp 100.1 this AM.    HTN- She is diet controlled.  DM2-  She is using levimir 23 units daily.  Reports that her AM sugars are 180's.  At night her sugar is 230's to 240's.   She continues [place].  She eats lunch and dinner.  She reports that lunch is her largest meal of the day.    Review of Systems See hpi  Past Medical History  Diagnosis Date  . Unspecified essential hypertension   . Proteinuria   . Excessive or frequent menstruation   . Anemia, unspecified   . Type II or unspecified type diabetes mellitus without mention of complication, not stated as uncontrolled   . Morbid obesity   . Obstructive sleep apnea 02/11/11    Sleep study: severe OSA- rec CPAP 20cm small  full face mask    History   Social History  . Marital Status: Single    Spouse Name: N/A    Number of Children: N/A  . Years of Education: N/A   Occupational History  . cna/med tech    Social History Main Topics  . Smoking status: Current Every Day Smoker -- 0.50 packs/day for 10 years    Types: Cigarettes  . Smokeless tobacco: Never Used  . Alcohol Use: Yes     Comment: occasional use  . Drug Use: No  . Sexually Active: Yes    Birth Control/ Protection: None   Other Topics Concern  . Not on file   Social History Narrative   Married-filing for divorced   29 year old daughter   Works as Engineer, materials    Past Surgical History  Procedure Laterality Date  . Cesarean section    . Ectopic pregnancy surgery      x 2  . Cyst removal    . Cleft palate repair      Family History  Problem Relation Age of Onset  . Cancer Father     oral cancer  . Heart attack Maternal Aunt     Allergies  Allergen Reactions  . Labetalol Nausea Only  . Aspirin     Knots in mouth   .  Ibuprofen     REACTION: knots in mouth    Current Outpatient Prescriptions on File Prior to Visit  Medication Sig Dispense Refill  . esomeprazole (NEXIUM) 40 MG capsule Take 1 capsule (40 mg total) by mouth daily before breakfast.  30 capsule  5  . glucose blood (ACCU-CHEK AVIVA) test strip Use to check blood sugar two times daily.  100 each  2  . insulin detemir (LEVEMIR) 100 UNIT/ML injection 23 units sq once daily  10 mL  12  . Insulin Pen Needle (BD ULTRA-FINE PEN NEEDLES) 29G X 12.7MM MISC Use with lantus solostar for insulin injections.  100 each  1  . INSULIN SYRINGE 1CC/29G (SUNMARK INSULIN SYR 1CC/29G) 29G X 1/2" 1 ML MISC Use for insulin injection once a day.  100 each  2  . Lancets (ACCU-CHEK MULTICLIX) lancets Use as instructed to check blood sugar twice a day.       . metFORMIN (GLUCOPHAGE) 1000 MG tablet Take 1 tablet (1,000 mg total) by mouth 2 (two) times daily with a meal.  180 tablet  0  . sitaGLIPtin (JANUVIA) 100 MG tablet Take 1 tablet (100 mg total) by mouth daily.  30 tablet  3  . vitamin E 100 UNIT capsule Take 100 Units by mouth daily.         No current facility-administered medications on file prior to visit.    BP 120/80  Pulse 116  Temp(Src) 98.3 F (36.8 C) (Oral)  Resp 18  Ht 5\' 5"  (1.651 m)  Wt 325 lb (147.419 kg)  BMI 54.08 kg/m2  SpO2 97%       Objective:   Physical Exam  Constitutional: She appears well-developed and well-nourished. No distress.  HENT:  Head: Normocephalic and atraumatic.  Right Ear: Tympanic membrane and ear canal normal.  Left Ear: Tympanic membrane and ear canal normal.  Mouth/Throat: No oropharyngeal exudate, posterior oropharyngeal edema or posterior oropharyngeal erythema.  Cardiovascular: Normal rate and regular rhythm.   No murmur heard. Pulmonary/Chest: Effort normal and breath sounds normal. No respiratory distress. She has no wheezes. She has no rales. She exhibits no tenderness.          Assessment &  Plan:

## 2012-11-17 NOTE — Assessment & Plan Note (Signed)
Symptoms most consistent with uri.recommended that she call if symptoms worsen or if no improvement.

## 2012-11-17 NOTE — Assessment & Plan Note (Addendum)
Deteriorated. Will try to get prior auth for lantus. In the meantime, titrate levemir as outlined inas. Add novolog 5 units ac luch which is largest meal. Obtain a1c  Consider addition of ACE next visit.  Needs FLP next visit.

## 2012-11-17 NOTE — Assessment & Plan Note (Signed)
She is s/p uterine ablation. Check cbc.

## 2012-11-17 NOTE — Patient Instructions (Addendum)
Increase your levemir by 2 units every 3 days until your fasting blood sugar is <110 then continue on that dose. Call if cough worsens, if you develop fever over 101, or if cough is not improved in 4-5 days. Follow up in 2 months.

## 2012-11-17 NOTE — Assessment & Plan Note (Signed)
bp stable cont low sodium diet.

## 2012-11-18 LAB — HEPATITIS B SURFACE ANTIBODY,QUALITATIVE: Hep B S Ab: NONREACTIVE

## 2012-11-18 LAB — HEMOGLOBIN A1C: Mean Plasma Glucose: 324 mg/dL — ABNORMAL HIGH (ref <117)

## 2012-11-19 ENCOUNTER — Telehealth: Payer: Self-pay | Admitting: *Deleted

## 2012-11-19 LAB — HEPATITIS B CORE ANTIBODY, IGM: Hep B C IgM: NEGATIVE

## 2012-11-19 MED ORDER — AZITHROMYCIN 250 MG PO TABS: ORAL_TABLET | ORAL | Status: DC

## 2012-11-19 MED ORDER — HYDROCOD POLST-CHLORPHEN POLST 10-8 MG/5ML PO LQCR: 5.0000 mL | Freq: Two times a day (BID) | ORAL | Status: DC | PRN

## 2012-11-19 NOTE — Telephone Encounter (Signed)
Rx sent for zithromax.  Please call in rx for tussionex as pended below. Thanks. If symptoms do not improve within 24 hours or if fever persists she should be re-evaluated in the office please.

## 2012-11-19 NOTE — Telephone Encounter (Signed)
Notified pt. Rx called to [facility] pharmacy. 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 Advised pt to let us know if she is unable to work and we will provide note.

## 2012-11-19 NOTE — Telephone Encounter (Signed)
Received call from pt stating she has tried the tessalon perles for her cough and it is not helping. States she was sent home from work this morning with a 101.3 temperature and she was told to let us know if symptoms worsened. Please advise.

## 2012-12-17 ENCOUNTER — Telehealth: Payer: Self-pay | Admitting: *Deleted

## 2012-12-17 MED ORDER — INSULIN PEN NEEDLE 29G X 12.7MM MISC: Status: DC

## 2012-12-17 NOTE — Telephone Encounter (Signed)
Received call from pt requesting refill of pen needles for insulin injections. Refill sent. Left detailed message on pt's home # and to call if any questions.

## 2013-03-25 ENCOUNTER — Ambulatory Visit (INDEPENDENT_AMBULATORY_CARE_PROVIDER_SITE_OTHER): Payer: BC Managed Care – PPO | Admitting: Internal Medicine

## 2013-03-25 ENCOUNTER — Encounter: Payer: Self-pay | Admitting: Internal Medicine

## 2013-03-25 VITALS — BP 122/84 | HR 97 | Wt 321.0 lb

## 2013-03-25 DIAGNOSIS — M79609 Pain in unspecified limb: Secondary | ICD-10-CM

## 2013-03-25 DIAGNOSIS — E119 Type 2 diabetes mellitus without complications: Secondary | ICD-10-CM

## 2013-03-25 DIAGNOSIS — G8929 Other chronic pain: Secondary | ICD-10-CM

## 2013-03-25 NOTE — Patient Instructions (Addendum)
Roll the affected foot over a tennis ball 20 times twice a day. After this soak the foot in warm Epsom salts for 15-20 minutes. Wear arch supports in both shoes. Podiatry referral indicated. Consider glucosamine sulfate 1500 mg daily for the fasciitis for 2-4 weeks ;this will rehydrate the soft tissues of the foot.

## 2013-03-25 NOTE — Progress Notes (Signed)
  Subjective:    Patient ID: Catherine Espinoza, female    DOB: 09-Sep-1976, 37 y.o.   MRN: [id number]  HPI The pain began 6-12 ago in the L heel & lateral foot  without associated injury or trigger . It is described as sharp  up to level 8 The pain does not radiate. The discomfort lasts constantly as pressure, worse after being off feet for a period It improves with mobilization; it is worse with medial rotation or foot extension No associated  redness ,swelling ,stiffness ,skin color change , or temperature change The pain has not been treated with NSAIDS, ice, heat or  Rx.      Review of Systems Negative or absent signs& symptoms are delineated below: Constitutional: no fever, chills, sweats, change in weight  Musculoskeletal:no  muscle cramps or pain Skin:no rash Neuro: no weakness; numbness and tingling Heme:no lymphadenopathy; abnormal bruising or bleeding       Objective:   Physical Exam   She is in no distress  Strength and tone are normal in all extremities  Deep tendon reflexes are 1/2+ at the knees  Pedal pulses are decreased but present. There are no ischemic skin changes  She does have pes planus. She has pain with percussion over the posterior aspect of the left sole. With inversion of the left foot or extension she has pain along the lateral aspect of the foot. There is no pain with compression of the foot itself.  She has chronic granulomatous, hyperpigmented changes of the right thumb.       Assessment & Plan:  #1 left foot pain; rule out plantar fasciitis bearing and  #2 pes planus  #3 diabetes, insulin-dependent  Plan: Exercises for the left foot; podiatry referral

## 2013-04-12 ENCOUNTER — Ambulatory Visit (INDEPENDENT_AMBULATORY_CARE_PROVIDER_SITE_OTHER): Payer: BC Managed Care – PPO | Admitting: Internal Medicine

## 2013-04-12 ENCOUNTER — Encounter: Payer: Self-pay | Admitting: Internal Medicine

## 2013-04-12 VITALS — BP 138/100 | HR 88 | Temp 98.2°F | Wt 316.0 lb

## 2013-04-12 DIAGNOSIS — L732 Hidradenitis suppurativa: Secondary | ICD-10-CM

## 2013-04-12 DIAGNOSIS — IMO0001 Reserved for inherently not codable concepts without codable children: Secondary | ICD-10-CM

## 2013-04-12 DIAGNOSIS — F17200 Nicotine dependence, unspecified, uncomplicated: Secondary | ICD-10-CM

## 2013-04-12 MED ORDER — SULFAMETHOXAZOLE-TRIMETHOPRIM 800-160 MG PO TABS: 1.0000 | ORAL_TABLET | Freq: Two times a day (BID) | ORAL | Status: DC

## 2013-04-12 MED ORDER — MUPIROCIN 2 % EX OINT: TOPICAL_OINTMENT | CUTANEOUS | Status: DC

## 2013-04-12 NOTE — Progress Notes (Signed)
  Subjective:    Patient ID: Catherine Espinoza, female    DOB: January 21, 1976, 37 y.o.   MRN: [id number]  HPI  She's developed a boil in the right axillary area one-2 weeks ago. These start out as painful lesions which burst with application of warm compresses. This lesion continues to leak.  This has been a recurrent problem. She denies a history of MRSA  She has no associated fever, chills, or sweats.  She states that she has had these lesions culture; no cultures are registered in the EMR.    Review of Systems  She is diabetic on metformin and Lantus. She states her fasting blood sugars range from 120-130 however, her last A1c was 12.9% 11/17/2012. This would represent an average sugar of 325 and increase long term heart attack or stroke risk of 158%.  She takes her metformin only when she's at home as he does impact her stools.  She denies polyuria, polyphagia, or polydipsia.        Objective:   Physical Exam General appearance : w/o distress. Morbid obesity  Eyes: No conjunctival inflammation or scleral icterus is present.  Heart:  Normal rate and regular rhythm. S1 and S2 normal without gallop, murmur, click, rub or other extra sounds     Lungs:Chest clear to auscultation; no wheezes, rhonchi,rales ,or rubs present.No increased work of breathing.    Skin:Warm & dry.  12 x 4 mm shallow ulcer with circumferential edema. No purulence or cellulitis suggested. Bland subcutaneous deficit in superior axilla on the right  Lymphatic: No lymphadenopathy is noted about the head, neck, axilla.             Assessment & Plan:  #1 shallow ulcer right axilla; no evidence of cellulitis at this time  #2 diabetes grossly uncontrolled with marked increase in cardiovascular risk. This is in the context of noncompliance with her metformin. Risks discussed in detail  Plan: See orders &  recommendations

## 2013-04-12 NOTE — Patient Instructions (Addendum)
Dip gauze in  sterile saline and applied to the wound twice a day. . The saline can be purchased at the drugstore or you can make your own .Boil cup of salt in a gallon of water. Store mixture  in a clean container.Report Warning  signs as discussed (red streaks, pus, fever, increasing pain).Cover the wound with  antibiotic ointment if pus or redness present .  Follow a  low carb nutrition program such as [place] or [employer]  to prevent Diabetes progression . White carbohydrates (potatoes, rice, bread, and pasta) have a high spike of sugar and a high load of sugar. For example a  baked potato has a cup of sugar and a  french fry  2 teaspoons of sugar. Yams, wild  rice, whole grained bread &  wheat pasta have been much lower spike and load of  sugar. Portions should be the size of a deck of cards or your palm.   If you activate the  My Chart system; lab & Xray results will be released directly  to you as soon as I review & address these through the computer. If you choose not to sign up for My Chart within 36 hours of labs being drawn; results will be reviewed & interpretation added before being copied & mailed, causing a delay in getting the results to you.If you do not receive that report within 7-10 days ,please call. Additionally you can use this system to gain direct  access to your records  if  out of town or @ an office of a  physician who is not in  the My Chart network.  This improves continuity of care & places you in control of your medical record.

## 2013-04-14 ENCOUNTER — Telehealth: Payer: Self-pay | Admitting: Family

## 2013-04-14 NOTE — Telephone Encounter (Signed)
Please call pt to arrange a 1 week follow up for her abscess and to discuss diabetes management.

## 2013-04-14 NOTE — Telephone Encounter (Signed)
Patient scheduled appointment for 04/20/13

## 2013-04-20 ENCOUNTER — Encounter: Payer: Self-pay | Admitting: Family

## 2013-04-20 ENCOUNTER — Ambulatory Visit (INDEPENDENT_AMBULATORY_CARE_PROVIDER_SITE_OTHER): Payer: BC Managed Care – PPO | Admitting: Family

## 2013-04-20 VITALS — BP 130/88 | HR 103 | Temp 98.2°F | Resp 18 | Ht 65.0 in | Wt 319.1 lb

## 2013-04-20 DIAGNOSIS — IMO0001 Reserved for inherently not codable concepts without codable children: Secondary | ICD-10-CM

## 2013-04-20 DIAGNOSIS — D649 Anemia, unspecified: Secondary | ICD-10-CM

## 2013-04-20 DIAGNOSIS — E119 Type 2 diabetes mellitus without complications: Secondary | ICD-10-CM

## 2013-04-20 DIAGNOSIS — L732 Hidradenitis suppurativa: Secondary | ICD-10-CM | POA: Insufficient documentation

## 2013-04-20 MED ORDER — DOXYCYCLINE HYCLATE 100 MG PO TABS: 100.0000 mg | ORAL_TABLET | Freq: Two times a day (BID) | ORAL | Status: DC

## 2013-04-20 MED ORDER — METFORMIN HCL ER 500 MG PO TB24: 500.0000 mg | ORAL_TABLET | Freq: Every day | ORAL | Status: DC

## 2013-04-20 NOTE — Progress Notes (Signed)
Subjective:    Patient ID: Catherine Espinoza, female    DOB: [birth year], 37 y.o.   MRN: [id number]  HPI  Catherine Espinoza is a 37 yr old female who presents today for follow up of her axillary cyst. She was seen two weeks ago by Dr. Alwyn Ren who recommended warm compresses.  She now reports new cysts on her right breast and left thigh.    DM2- reports that she recently added back in metformin and is have nausea (vomitted this AM).  However sugars are under 200 since she resumed metformin.    Review of Systems    see HPI  Past Medical History  Diagnosis Date  . Unspecified essential hypertension   . Proteinuria   . Excessive or frequent menstruation   . Anemia, unspecified   . Type II or unspecified type diabetes mellitus without mention of complication, not stated as uncontrolled   . Morbid obesity   . Obstructive sleep apnea 02/11/11    Sleep study: severe OSA- rec CPAP 20cm small  full face mask    History   Social History  . Marital Status: Single    Spouse Name: N/A    Number of Children: N/A  . Years of Education: N/A   Occupational History  . cna/med tech    Social History Main Topics  . Smoking status: Current Every Day Smoker -- 0.50 packs/day for 10 years    Types: Cigarettes  . Smokeless tobacco: Never Used  . Alcohol Use: Yes     Comment: occasional use  . Drug Use: No  . Sexually Active: Yes    Birth Control/ Protection: None   Other Topics Concern  . Not on file   Social History Narrative   Married-filing for divorced   7 year old daughter   Works as Engineer, materials    Past Surgical History  Procedure Laterality Date  . Cesarean section    . Ectopic pregnancy surgery      x 2  . Cyst removal    . Cleft palate repair      Family History  Problem Relation Age of Onset  . Cancer Father     oral cancer  . Heart attack Maternal Aunt     Allergies  Allergen Reactions  . Ibuprofen     REACTION: knots in mouth with SOB  . Labetalol Nausea Only  .  Aspirin     Knots in mouth     Current Outpatient Prescriptions on File Prior to Visit  Medication Sig Dispense Refill  . esomeprazole (NEXIUM) 40 MG capsule Take 1 capsule (40 mg total) by mouth daily before breakfast.  30 capsule  5  . glucose blood (ACCU-CHEK AVIVA) test strip Use to check blood sugar two times daily.  100 each  2  . insulin glargine (LANTUS SOLOSTAR) 100 UNIT/ML injection 23 units SQ QHS  5 pen  5  . Insulin Pen Needle (BD ULTRA-FINE PEN NEEDLES) 29G X 12.7MM MISC Use with lantus solostar for insulin injections.  100 each  1  . INSULIN SYRINGE 1CC/29G (SUNMARK INSULIN SYR 1CC/29G) 29G X 1/2" 1 ML MISC Use for insulin injection once a day.  100 each  2  . Lancets (ACCU-CHEK MULTICLIX) lancets Use as instructed to check blood sugar twice a day.       . mupirocin ointment (BACTROBAN) 2 % Applied twice a day to the affected area;NOT into eyes.  22 g  0  . sitaGLIPtin (JANUVIA) 100 MG tablet Take  1 tablet (100 mg total) by mouth daily.  30 tablet  3  . sulfamethoxazole-trimethoprim (BACTRIM DS,SEPTRA DS) 800-160 MG per tablet Take 1 tablet by mouth 2 (two) times daily.  14 tablet  0   No current facility-administered medications on file prior to visit.    BP 130/88  Pulse 103  Temp(Src) 98.2 F (36.8 C) (Oral)  Resp 18  Ht 5\' 5"  (1.651 m)  Wt 319 lb 1.3 oz (144.734 kg)  BMI 53.1 kg/m2  SpO2 93%     Objective:   Physical Exam  Constitutional: She appears well-developed and well-nourished. No distress.  HENT:  Head: Normocephalic and atraumatic.  Cardiovascular: Normal rate and regular rhythm.   No murmur heard. Pulmonary/Chest: Effort normal and breath sounds normal.  Musculoskeletal: She exhibits no edema.  Neurological: She is alert.  Skin: Skin is warm and dry.  Open small abscess right axilla draining yellow fluid. Small draining abscess right inner thigh Left lower breast with fluctuance abscess- small.   Psychiatric: She has a normal mood and affect.  Her behavior is normal. Judgment and thought content normal.          Assessment & Plan:

## 2013-04-20 NOTE — Addendum Note (Signed)
Addended by: Silvio Pate D on: 04/20/2013 08:36 AM   Modules accepted: Orders

## 2013-04-20 NOTE — Assessment & Plan Note (Signed)
Improving now that she is back on metformin, however she is not tolerating the GI side effects. Will give trial for glucophage-xr. Continue novolog and lantus. Discussed importance of daily exercise such as walking.

## 2013-04-20 NOTE — Assessment & Plan Note (Signed)
Will rx with doxycycline. Recommended I and D of abscess left breast. She declined at this time.  Plan follow up in 1 week.

## 2013-04-20 NOTE — Patient Instructions (Addendum)
Please follow up in 1 week. Call if symptoms worsen or if symptoms do not improve.

## 2013-04-21 LAB — CBC WITH DIFFERENTIAL/PLATELET
Basophils Relative: 1 % (ref 0–1)
Eosinophils Absolute: 0.1 10*3/uL (ref 0.0–0.7)
Eosinophils Relative: 2 % (ref 0–5)
Hemoglobin: 15 g/dL (ref 12.0–15.0)
Lymphs Abs: 2.8 10*3/uL (ref 0.7–4.0)
MCH: 28.2 pg (ref 26.0–34.0)
MCHC: 32.2 g/dL (ref 30.0–36.0)
MCV: 87.8 fL (ref 78.0–100.0)
Monocytes Absolute: 0.3 10*3/uL (ref 0.1–1.0)
Monocytes Relative: 5 % (ref 3–12)
RBC: 5.31 MIL/uL — ABNORMAL HIGH (ref 3.87–5.11)

## 2013-04-21 LAB — BASIC METABOLIC PANEL
CO2: 25 mEq/L (ref 19–32)
Chloride: 100 mEq/L (ref 96–112)
Potassium: 4.2 mEq/L (ref 3.5–5.3)

## 2013-04-28 ENCOUNTER — Ambulatory Visit: Payer: BC Managed Care – PPO | Admitting: Family

## 2013-04-30 ENCOUNTER — Telehealth: Payer: Self-pay | Admitting: *Deleted

## 2013-04-30 MED ORDER — METFORMIN HCL ER 500 MG PO TB24: ORAL_TABLET | ORAL | Status: DC

## 2013-04-30 NOTE — Telephone Encounter (Signed)
Per verbal from Provider correction to note below:  Pt should take 4 tablets once a day. Notified pt and she voices understanding. Rx sent to pharmacy.

## 2013-04-30 NOTE — Telephone Encounter (Signed)
Received message from pt requesting clarification of metformin XR prescription. States she used to take metformin 500mg  2 twice a day. New Rx for Metformin XR 500mg  says 1 a day. Pt is out and needs refill. Please advise what directions / dose should be?

## 2013-04-30 NOTE — Telephone Encounter (Signed)
I would like her to take metformin XR 500mg  two tabs by mouth once daily please.

## 2013-07-06 ENCOUNTER — Encounter: Payer: Self-pay | Admitting: Cardiology

## 2013-07-16 ENCOUNTER — Telehealth: Payer: Self-pay | Admitting: Family

## 2013-07-16 NOTE — Telephone Encounter (Signed)
Left detailed message on home # to call and arrange appt or ask for me if any questions.  Judeth Cornfield, can you call pt again next week if she still hasn't scheduled appt?

## 2013-07-16 NOTE — Telephone Encounter (Signed)
Please call pt and arrange a follow up visit for her diabetes.

## 2013-07-19 NOTE — Telephone Encounter (Signed)
Left detailed message informing patient that she needs to call our office to schedule follow up for diabetes. °

## 2013-07-21 NOTE — Telephone Encounter (Signed)
Left detailed message informing patient that she needs to call our office to schedule follow up for diabetes.

## 2013-07-22 NOTE — Telephone Encounter (Signed)
Appointment scheduled for 08/10/13

## 2013-08-10 ENCOUNTER — Ambulatory Visit: Payer: BC Managed Care – PPO | Admitting: Family

## 2013-09-01 ENCOUNTER — Telehealth: Payer: Self-pay | Admitting: *Deleted

## 2013-09-01 NOTE — Telephone Encounter (Signed)
Received fax from [facility], ph) [phone]  Fax) [phone] requesting order for pt's diabetic testing supplies.  Per previous documentation, pt is using [facility]. Left detailed message for pt to call with verification of which company she would like to use.

## 2013-10-03 ENCOUNTER — Encounter (HOSPITAL_BASED_OUTPATIENT_CLINIC_OR_DEPARTMENT_OTHER): Payer: Self-pay | Admitting: Emergency Medicine

## 2013-10-03 ENCOUNTER — Emergency Department (HOSPITAL_BASED_OUTPATIENT_CLINIC_OR_DEPARTMENT_OTHER)
Admission: EM | Admit: 2013-10-03 | Discharge: 2013-10-03 | Discharge status: Against Medical Advice | Payer: BC Managed Care – PPO | Attending: Emergency Medicine | Admitting: Emergency Medicine

## 2013-10-03 DIAGNOSIS — I10 Essential (primary) hypertension: Secondary | ICD-10-CM | POA: Insufficient documentation

## 2013-10-03 DIAGNOSIS — L089 Local infection of the skin and subcutaneous tissue, unspecified: Secondary | ICD-10-CM | POA: Insufficient documentation

## 2013-10-03 DIAGNOSIS — F17200 Nicotine dependence, unspecified, uncomplicated: Secondary | ICD-10-CM | POA: Insufficient documentation

## 2013-10-03 DIAGNOSIS — E119 Type 2 diabetes mellitus without complications: Secondary | ICD-10-CM | POA: Insufficient documentation

## 2013-10-03 NOTE — ED Notes (Signed)
Patient with ?abcess to her right breast area.  Patient states that she gets abscess and not sure if this is an abscess or not. No drainage

## 2013-10-11 ENCOUNTER — Encounter: Payer: Self-pay | Admitting: Family

## 2013-10-11 ENCOUNTER — Ambulatory Visit (INDEPENDENT_AMBULATORY_CARE_PROVIDER_SITE_OTHER): Payer: BC Managed Care – PPO | Admitting: Family

## 2013-10-11 ENCOUNTER — Ambulatory Visit: Payer: BC Managed Care – PPO | Admitting: Family

## 2013-10-11 VITALS — BP 140/100 | HR 91 | Temp 98.1°F | Resp 16 | Ht 65.0 in | Wt 316.0 lb

## 2013-10-11 DIAGNOSIS — L0390 Cellulitis, unspecified: Secondary | ICD-10-CM | POA: Insufficient documentation

## 2013-10-11 DIAGNOSIS — L0291 Cutaneous abscess, unspecified: Secondary | ICD-10-CM

## 2013-10-11 DIAGNOSIS — I10 Essential (primary) hypertension: Secondary | ICD-10-CM

## 2013-10-11 MED ORDER — DOXYCYCLINE HYCLATE 100 MG PO TABS: 100.0000 mg | ORAL_TABLET | Freq: Two times a day (BID) | ORAL | Status: DC

## 2013-10-11 MED ORDER — LISINOPRIL 10 MG PO TABS: 10.0000 mg | ORAL_TABLET | Freq: Every day | ORAL | Status: DC

## 2013-10-11 NOTE — Patient Instructions (Signed)
Start lisinopril. Start doxycycline. Follow up in 1 week.

## 2013-10-11 NOTE — Assessment & Plan Note (Addendum)
>>  ASSESSMENT AND PLAN FOR HYPERTENSION WRITTEN ON 10/11/2013  5:15 PM BY Kendre Jacinto K  BP elevated today in office.  Start lisinopril 10mg  daily. Continue low sodium diet. Follow in one week.  >>ASSESSMENT AND PLAN FOR HTN (HYPERTENSION) WRITTEN ON 10/11/2013  7:01 PM BY O'SULLIVAN, MELISSA, NP  BP up today.  Pt is diabetic and has hx of proteinuria. Start ace, pt advised not to become pregnant while taking this medication due to risk to fetus. Pt verbalizes understanding.

## 2013-10-11 NOTE — Assessment & Plan Note (Signed)
BP up today.  Pt is diabetic and has hx of proteinuria. Start ace, pt advised not to become pregnant while taking this medication due to risk to fetus. Pt verbalizes understanding.

## 2013-10-11 NOTE — Assessment & Plan Note (Addendum)
New. No indication for I and D at this time. Suspect discharge from right nipple is puss related to cellulitis.  Start  Follow up in one week. If persistent breast discharge, plan diagnostic mammo.

## 2013-10-11 NOTE — Progress Notes (Signed)
Subjective:    Patient ID: Catherine Espinoza, female    DOB: 07/15/1976, 38 y.o.   MRN: [id number]  HPI [name] is a 38 year old female who presents today with a chief complaint of abscess to the right breast that have been present x 10 days. Patient reports warm compresses and warm baths with epsom salt provide relief. Patient reports pain has decreased, but redness is about the same. Denies fevers, chills.    Review of Systems  Constitutional: Negative for fever and chills.  Skin: Positive for wound.       Two abscesses noted to right breast. One to right nipple, second is lateral to right nipple.   Past Medical History  Diagnosis Date  . Unspecified essential hypertension   . Proteinuria   . Excessive or frequent menstruation   . Anemia, unspecified   . Type II or unspecified type diabetes mellitus without mention of complication, not stated as uncontrolled   . Morbid obesity   . Obstructive sleep apnea 02/11/11    Sleep study: severe OSA- rec CPAP 20cm small  full face mask    History   Social History  . Marital Status: Single    Spouse Name: N/A    Number of Children: N/A  . Years of Education: N/A   Occupational History  . cna/med tech    Social History Main Topics  . Smoking status: Current Every Day Smoker -- 0.50 packs/day for 10 years    Types: Cigarettes  . Smokeless tobacco: Never Used  . Alcohol Use: Yes     Comment: occasional use  . Drug Use: No  . Sexual Activity: Yes    Birth Control/ Protection: None   Other Topics Concern  . Not on file   Social History Narrative   Married-filing for divorced   9 year old daughter   Works as Industrial/product designer    Past Surgical History  Procedure Laterality Date  . Cesarean section    . Ectopic pregnancy surgery      x 2  . Cyst removal    . Cleft palate repair      Family History  Problem Relation Age of Onset  . Cancer Father     oral cancer  . Heart attack Maternal Aunt     Allergies  Allergen  Reactions  . Ibuprofen     REACTION: knots in mouth with SOB  . Labetalol Nausea Only  . Aspirin     Knots in mouth     Current Outpatient Prescriptions on File Prior to Visit  Medication Sig Dispense Refill  . esomeprazole (NEXIUM) 40 MG capsule Take 1 capsule (40 mg total) by mouth daily before breakfast.  30 capsule  5  . glucose blood (ACCU-CHEK AVIVA) test strip Use to check blood sugar two times daily.  100 each  2  . insulin aspart (NOVOLOG FLEXPEN) 100 UNIT/ML SOPN FlexPen Inject 5 Units into the skin daily before lunch.      . insulin glargine (LANTUS SOLOSTAR) 100 UNIT/ML injection 23 units SQ QHS  5 pen  5  . Insulin Pen Needle (BD ULTRA-FINE PEN NEEDLES) 29G X 12.7MM MISC Use with lantus solostar for insulin injections.  100 each  1  . INSULIN SYRINGE 1CC/29G (SUNMARK INSULIN SYR 1CC/29G) 29G X 1/2" 1 ML MISC Use for insulin injection once a day.  100 each  2  . Lancets (ACCU-CHEK MULTICLIX) lancets Use as instructed to check blood sugar twice a day.       Marland Kitchen  metFORMIN (GLUCOPHAGE-XR) 500 MG 24 hr tablet Take 4 tablets by mouth once a day.  120 tablet  3  . sitaGLIPtin (JANUVIA) 100 MG tablet Take 1 tablet (100 mg total) by mouth daily.  30 tablet  3   No current facility-administered medications on file prior to visit.    BP 140/100  Pulse 91  Temp(Src) 98.1 F (36.7 C) (Oral)  Resp 16  Ht 5\' 5"  (1.651 m)  Wt 316 lb 0.6 oz (143.355 kg)  BMI 52.59 kg/m2  SpO2 97%       Objective:   Physical Exam  Constitutional: She is oriented to person, place, and time. She appears well-nourished.  HENT:  Head: Normocephalic.  Cardiovascular: Normal rate and regular rhythm.   Pulmonary/Chest: Effort normal and breath sounds normal. No respiratory distress. Right breast exhibits nipple discharge and tenderness.    Neurological: She is alert and oriented to person, place, and time.  Skin: Skin is warm and dry. Rash noted. There is erythema.  R nipple is slightly indurated  and tender. scant whitish, milky, puss expressed. R lateral breast abscess is noted, no fluctuance, some surrounding erythema is present.           Assessment & Plan:

## 2013-10-11 NOTE — Progress Notes (Signed)
Pre visit review using our clinic review tool, if applicable. No additional management support is needed unless otherwise documented below in the visit note. 

## 2013-10-18 ENCOUNTER — Ambulatory Visit: Payer: BC Managed Care – PPO | Admitting: Family

## 2014-01-17 ENCOUNTER — Ambulatory Visit (INDEPENDENT_AMBULATORY_CARE_PROVIDER_SITE_OTHER): Payer: BC Managed Care – PPO | Admitting: Family

## 2014-01-17 ENCOUNTER — Encounter: Payer: Self-pay | Admitting: Family

## 2014-01-17 VITALS — BP 140/104 | HR 101 | Temp 98.1°F | Resp 18 | Ht 65.0 in | Wt 310.0 lb

## 2014-01-17 DIAGNOSIS — N63 Unspecified lump in unspecified breast: Secondary | ICD-10-CM

## 2014-01-17 DIAGNOSIS — L0291 Cutaneous abscess, unspecified: Secondary | ICD-10-CM

## 2014-01-17 DIAGNOSIS — N6310 Unspecified lump in the right breast, unspecified quadrant: Secondary | ICD-10-CM

## 2014-01-17 DIAGNOSIS — L0390 Cellulitis, unspecified: Secondary | ICD-10-CM

## 2014-01-17 MED ORDER — DOXYCYCLINE HYCLATE 100 MG PO TABS: 100.0000 mg | ORAL_TABLET | Freq: Two times a day (BID) | ORAL | Status: DC

## 2014-01-17 NOTE — Patient Instructions (Signed)
Start doxycycline. Call if increased pain/redness swelling. You will be contacted about your mammogram.  Follow up in 1 week.

## 2014-01-17 NOTE — Progress Notes (Signed)
Subjective:    Patient ID: Catherine Espinoza, female    DOB: 1976-06-07, 38 y.o.   MRN: [id number]  HPI  Catherine Espinoza is a 38 yr old female who presents today with chief complaint of right sided  breast pain.  Reports area is tender and painful near the nipple. First noticed 1 week ago. Denies nipple drainage.   Review of Systems See HPI  Past Medical History  Diagnosis Date  . Unspecified essential hypertension   . Proteinuria   . Excessive or frequent menstruation   . Anemia, unspecified   . Type II or unspecified type diabetes mellitus without mention of complication, not stated as uncontrolled   . Morbid obesity   . Obstructive sleep apnea 02/11/11    Sleep study: severe OSA- rec CPAP 20cm small  full face mask    History   Social History  . Marital Status: Single    Spouse Name: N/A    Number of Children: N/A  . Years of Education: N/A   Occupational History  . cna/med tech    Social History Main Topics  . Smoking status: Current Every Day Smoker -- 0.50 packs/day for 10 years    Types: Cigarettes  . Smokeless tobacco: Never Used  . Alcohol Use: Yes     Comment: occasional use  . Drug Use: No  . Sexual Activity: Yes    Birth Control/ Protection: None   Other Topics Concern  . Not on file   Social History Narrative   Married-filing for divorced   52 year old daughter   Works as Industrial/product designer    Past Surgical History  Procedure Laterality Date  . Cesarean section    . Ectopic pregnancy surgery      x 2  . Cyst removal    . Cleft palate repair      Family History  Problem Relation Age of Onset  . Cancer Father     oral cancer  . Heart attack Maternal Aunt     Allergies  Allergen Reactions  . Ibuprofen     REACTION: knots in mouth with SOB  . Labetalol Nausea Only  . Aspirin     Knots in mouth     Current Outpatient Prescriptions on File Prior to Visit  Medication Sig Dispense Refill  . doxycycline (VIBRA-TABS) 100 MG tablet Take 1 tablet  (100 mg total) by mouth 2 (two) times daily.  14 tablet  0  . esomeprazole (NEXIUM) 40 MG capsule Take 1 capsule (40 mg total) by mouth daily before breakfast.  30 capsule  5  . glucose blood (ACCU-CHEK AVIVA) test strip Use to check blood sugar two times daily.  100 each  2  . insulin aspart (NOVOLOG FLEXPEN) 100 UNIT/ML SOPN FlexPen Inject 5 Units into the skin daily before lunch.      . insulin glargine (LANTUS SOLOSTAR) 100 UNIT/ML injection 23 units SQ QHS  5 pen  5  . Insulin Pen Needle (BD ULTRA-FINE PEN NEEDLES) 29G X 12.7MM MISC Use with lantus solostar for insulin injections.  100 each  1  . INSULIN SYRINGE 1CC/29G (SUNMARK INSULIN SYR 1CC/29G) 29G X 1/2" 1 ML MISC Use for insulin injection once a day.  100 each  2  . Lancets (ACCU-CHEK MULTICLIX) lancets Use as instructed to check blood sugar twice a day.       . lisinopril (PRINIVIL,ZESTRIL) 10 MG tablet Take 1 tablet (10 mg total) by mouth daily.  30 tablet  1  .  metFORMIN (GLUCOPHAGE-XR) 500 MG 24 hr tablet Take 4 tablets by mouth once a day.  120 tablet  3  . sitaGLIPtin (JANUVIA) 100 MG tablet Take 1 tablet (100 mg total) by mouth daily.  30 tablet  3   No current facility-administered medications on file prior to visit.    BP 140/104  Pulse 101  Temp(Src) 98.1 F (36.7 C) (Oral)  Resp 18  Ht 5\' 5"  (1.651 m)  Wt 310 lb 0.6 oz (140.633 kg)  BMI 51.59 kg/m2  SpO2 99%       Objective:   Physical Exam  Constitutional: She is oriented to person, place, and time. She appears well-developed and well-nourished.  Pulmonary/Chest:    Neurological: She is alert and oriented to person, place, and time.  Psychiatric: She has a normal mood and affect. Her behavior is normal. Judgment and thought content normal.          Assessment & Plan:

## 2014-01-17 NOTE — Progress Notes (Signed)
Pre visit review using our clinic review tool, if applicable. No additional management support is needed unless otherwise documented below in the visit note. 

## 2014-01-17 NOTE — Assessment & Plan Note (Signed)
I think she most likely has breast cellulitis, however she is very concerned re: possibility of breast cancer.  Will rx with doxy, order diagnostic mammo, follow up in 1 week.

## 2014-01-18 ENCOUNTER — Other Ambulatory Visit: Payer: Self-pay | Admitting: Family

## 2014-01-18 DIAGNOSIS — N6310 Unspecified lump in the right breast, unspecified quadrant: Secondary | ICD-10-CM

## 2014-01-24 ENCOUNTER — Encounter: Payer: Self-pay | Admitting: Family

## 2014-01-24 ENCOUNTER — Ambulatory Visit (INDEPENDENT_AMBULATORY_CARE_PROVIDER_SITE_OTHER): Payer: BC Managed Care – PPO | Admitting: Family

## 2014-01-24 VITALS — BP 128/92 | HR 99 | Temp 98.0°F | Resp 16 | Ht 65.0 in | Wt 310.0 lb

## 2014-01-24 DIAGNOSIS — L0291 Cutaneous abscess, unspecified: Secondary | ICD-10-CM

## 2014-01-24 DIAGNOSIS — L0390 Cellulitis, unspecified: Principal | ICD-10-CM

## 2014-01-24 NOTE — Progress Notes (Signed)
Pre visit review using our clinic review tool, if applicable. No additional management support is needed unless otherwise documented below in the visit note. 

## 2014-01-24 NOTE — Patient Instructions (Addendum)
Please complete mammogram tomorrow. Complete doxycycline. Call if symptoms worsen or if symptoms do not improve.  Follow up in 1 month for diabetes follow up.

## 2014-01-24 NOTE — Progress Notes (Signed)
Subjective:    Patient ID: Catherine Espinoza, female    DOB: 02-17-1976, 38 y.o.   MRN: [id number]  HPI  Catherine Espinoza is a 38 yr old female who presents today for follow up of her right beast pain/cellulitis. She was give rx last visit for doxycycline. She is scheduled for a diagnostic mammogram on 4/28 (tomorrow). Reports that the area is less swollen and less painful.   Review of Systems See HPI  Past Medical History  Diagnosis Date  . Unspecified essential hypertension   . Proteinuria   . Excessive or frequent menstruation   . Anemia, unspecified   . Type II or unspecified type diabetes mellitus without mention of complication, not stated as uncontrolled   . Morbid obesity   . Obstructive sleep apnea 02/11/11    Sleep study: severe OSA- rec CPAP 20cm small  full face mask    History   Social History  . Marital Status: Single    Spouse Name: N/A    Number of Children: N/A  . Years of Education: N/A   Occupational History  . cna/med tech    Social History Main Topics  . Smoking status: Current Every Day Smoker -- 0.50 packs/day for 10 years    Types: Cigarettes  . Smokeless tobacco: Never Used  . Alcohol Use: Yes     Comment: occasional use  . Drug Use: No  . Sexual Activity: Yes    Birth Control/ Protection: None   Other Topics Concern  . Not on file   Social History Narrative   Married-filing for divorced   34 year old daughter   Works as Industrial/product designer    Past Surgical History  Procedure Laterality Date  . Cesarean section    . Ectopic pregnancy surgery      x 2  . Cyst removal    . Cleft palate repair      Family History  Problem Relation Age of Onset  . Cancer Father     oral cancer  . Heart attack Maternal Aunt     Allergies  Allergen Reactions  . Ibuprofen     REACTION: knots in mouth with SOB  . Labetalol Nausea Only  . Aspirin     Knots in mouth     Current Outpatient Prescriptions on File Prior to Visit  Medication Sig Dispense  Refill  . esomeprazole (NEXIUM) 40 MG capsule Take 1 capsule (40 mg total) by mouth daily before breakfast.  30 capsule  5  . glucose blood (ACCU-CHEK AVIVA) test strip Use to check blood sugar two times daily.  100 each  2  . insulin aspart (NOVOLOG FLEXPEN) 100 UNIT/ML SOPN FlexPen Inject 5 Units into the skin daily before lunch.      . insulin glargine (LANTUS SOLOSTAR) 100 UNIT/ML injection 23 units SQ QHS  5 pen  5  . Insulin Pen Needle (BD ULTRA-FINE PEN NEEDLES) 29G X 12.7MM MISC Use with lantus solostar for insulin injections.  100 each  1  . INSULIN SYRINGE 1CC/29G ([place] INSULIN SYR 1CC/29G) 29G X 1/2" 1 ML MISC Use for insulin injection once a day.  100 each  2  . Lancets (ACCU-CHEK MULTICLIX) lancets Use as instructed to check blood sugar twice a day.       . lisinopril (PRINIVIL,ZESTRIL) 10 MG tablet Take 1 tablet (10 mg total) by mouth daily.  30 tablet  1  . metFORMIN (GLUCOPHAGE-XR) 500 MG 24 hr tablet Take 4 tablets by mouth once  a day.  120 tablet  3  . sitaGLIPtin (JANUVIA) 100 MG tablet Take 1 tablet (100 mg total) by mouth daily.  30 tablet  3   No current facility-administered medications on file prior to visit.    BP 128/92  Pulse 99  Temp(Src) 98 F (36.7 C) (Oral)  Resp 16  Ht 5\' 5"  (1.651 m)  Wt 310 lb (140.615 kg)  BMI 51.59 kg/m2  SpO2 96%       Objective:   Physical Exam  Constitutional: She appears well-developed and well-nourished. No distress.  Cardiovascular: Normal rate and regular rhythm.   No murmur heard. Pulmonary/Chest: Effort normal and breath sounds normal.  Skin:  Resolution or induration/erythema right breast.          Assessment & Plan:

## 2014-01-24 NOTE — Assessment & Plan Note (Signed)
Resolved. Complete doxy and scheduled mammogram

## 2014-01-25 ENCOUNTER — Ambulatory Visit
Admission: RE | Admit: 2014-01-25 | Discharge: 2014-01-25 | Disposition: A | Discharge status: Home / Self Care | Payer: BC Managed Care – PPO | Source: Ambulatory Visit | Attending: Family | Admitting: Family

## 2014-01-25 ENCOUNTER — Other Ambulatory Visit: Payer: Self-pay | Admitting: Family

## 2014-01-25 DIAGNOSIS — N6310 Unspecified lump in the right breast, unspecified quadrant: Secondary | ICD-10-CM

## 2014-02-02 ENCOUNTER — Encounter (HOSPITAL_BASED_OUTPATIENT_CLINIC_OR_DEPARTMENT_OTHER): Payer: Self-pay | Admitting: Emergency Medicine

## 2014-02-02 ENCOUNTER — Emergency Department (HOSPITAL_BASED_OUTPATIENT_CLINIC_OR_DEPARTMENT_OTHER): Payer: BC Managed Care – PPO

## 2014-02-02 ENCOUNTER — Emergency Department (HOSPITAL_BASED_OUTPATIENT_CLINIC_OR_DEPARTMENT_OTHER)
Admission: EM | Admit: 2014-02-02 | Discharge: 2014-02-02 | Disposition: A | Discharge status: Home / Self Care | Payer: BC Managed Care – PPO | Attending: Emergency Medicine | Admitting: Emergency Medicine

## 2014-02-02 DIAGNOSIS — N9489 Other specified conditions associated with female genital organs and menstrual cycle: Secondary | ICD-10-CM

## 2014-02-02 DIAGNOSIS — G4733 Obstructive sleep apnea (adult) (pediatric): Secondary | ICD-10-CM | POA: Insufficient documentation

## 2014-02-02 DIAGNOSIS — Z79899 Other long term (current) drug therapy: Secondary | ICD-10-CM | POA: Insufficient documentation

## 2014-02-02 DIAGNOSIS — Z862 Personal history of diseases of the blood and blood-forming organs and certain disorders involving the immune mechanism: Secondary | ICD-10-CM | POA: Insufficient documentation

## 2014-02-02 DIAGNOSIS — F17200 Nicotine dependence, unspecified, uncomplicated: Secondary | ICD-10-CM | POA: Insufficient documentation

## 2014-02-02 DIAGNOSIS — Z9089 Acquired absence of other organs: Secondary | ICD-10-CM | POA: Insufficient documentation

## 2014-02-02 DIAGNOSIS — K8020 Calculus of gallbladder without cholecystitis without obstruction: Secondary | ICD-10-CM | POA: Insufficient documentation

## 2014-02-02 DIAGNOSIS — E119 Type 2 diabetes mellitus without complications: Secondary | ICD-10-CM | POA: Insufficient documentation

## 2014-02-02 DIAGNOSIS — I10 Essential (primary) hypertension: Secondary | ICD-10-CM | POA: Insufficient documentation

## 2014-02-02 DIAGNOSIS — Z3202 Encounter for pregnancy test, result negative: Secondary | ICD-10-CM | POA: Insufficient documentation

## 2014-02-02 DIAGNOSIS — Z794 Long term (current) use of insulin: Secondary | ICD-10-CM | POA: Insufficient documentation

## 2014-02-02 DIAGNOSIS — N838 Other noninflammatory disorders of ovary, fallopian tube and broad ligament: Secondary | ICD-10-CM | POA: Insufficient documentation

## 2014-02-02 DIAGNOSIS — R110 Nausea: Secondary | ICD-10-CM | POA: Insufficient documentation

## 2014-02-02 LAB — CBC WITH DIFFERENTIAL/PLATELET
Basophils Absolute: 0 10*3/uL (ref 0.0–0.1)
Basophils Relative: 0 % (ref 0–1)
EOS PCT: 2 % (ref 0–5)
Eosinophils Absolute: 0.1 10*3/uL (ref 0.0–0.7)
HEMATOCRIT: 45.4 % (ref 36.0–46.0)
HEMOGLOBIN: 15.3 g/dL — AB (ref 12.0–15.0)
LYMPHS ABS: 2.6 10*3/uL (ref 0.7–4.0)
LYMPHS PCT: 40 % (ref 12–46)
MCH: 30 pg (ref 26.0–34.0)
MCHC: 33.7 g/dL (ref 30.0–36.0)
MCV: 89 fL (ref 78.0–100.0)
MONO ABS: 0.5 10*3/uL (ref 0.1–1.0)
MONOS PCT: 8 % (ref 3–12)
NEUTROS ABS: 3.4 10*3/uL (ref 1.7–7.7)
Neutrophils Relative %: 51 % (ref 43–77)
Platelets: 225 10*3/uL (ref 150–400)
RBC: 5.1 MIL/uL (ref 3.87–5.11)
RDW: 12.7 % (ref 11.5–15.5)
WBC: 6.7 10*3/uL (ref 4.0–10.5)

## 2014-02-02 LAB — URINALYSIS, ROUTINE W REFLEX MICROSCOPIC
Glucose, UA: >1000 mg/dL — AB
Hgb urine dipstick: NEGATIVE
KETONES UR: 15 mg/dL — AB
NITRITE: NEGATIVE
PH: 5.5 (ref 5.0–8.0)
Protein, ur: 30 mg/dL — AB
SPECIFIC GRAVITY, URINE: 1.034 — AB (ref 1.005–1.030)
Urobilinogen, UA: 1 mg/dL (ref 0.0–1.0)

## 2014-02-02 LAB — COMPREHENSIVE METABOLIC PANEL
ALT: 17 U/L (ref 0–35)
AST: 14 U/L (ref 0–37)
Albumin: 3.3 g/dL — ABNORMAL LOW (ref 3.5–5.2)
Alkaline Phosphatase: 74 U/L (ref 39–117)
BUN: 6 mg/dL (ref 6–23)
CALCIUM: 9.4 mg/dL (ref 8.4–10.5)
CO2: 26 meq/L (ref 19–32)
CREATININE: 0.4 mg/dL — AB (ref 0.50–1.10)
Chloride: 97 mEq/L (ref 96–112)
GFR calc Af Amer: >90 mL/min (ref >90)
GLUCOSE: 290 mg/dL — AB (ref 70–99)
Potassium: 4.1 mEq/L (ref 3.7–5.3)
Sodium: 135 mEq/L — ABNORMAL LOW (ref 137–147)
Total Bilirubin: 0.5 mg/dL (ref 0.3–1.2)
Total Protein: 7.3 g/dL (ref 6.0–8.3)

## 2014-02-02 LAB — URINE MICROSCOPIC-ADD ON

## 2014-02-02 LAB — PREGNANCY, URINE: Preg Test, Ur: NEGATIVE

## 2014-02-02 LAB — LIPASE, BLOOD: Lipase: 10 U/L — ABNORMAL LOW (ref 11–59)

## 2014-02-02 MED ORDER — ONDANSETRON HCL 4 MG/2ML IJ SOLN
INTRAMUSCULAR | Status: DC
Start: 2014-02-02 — End: 2014-02-02
  Filled 2014-02-02: qty 2

## 2014-02-02 MED ORDER — ONDANSETRON HCL 4 MG/2ML IJ SOLN
4.0000 mg | Freq: Once | INTRAMUSCULAR | Status: AC
  Administered 2014-02-02: 4 mg via INTRAVENOUS

## 2014-02-02 MED ORDER — IOHEXOL 300 MG/ML  SOLN
50.0000 mL | Freq: Once | INTRAMUSCULAR | Status: AC | PRN
  Administered 2014-02-02: 50 mL via ORAL

## 2014-02-02 MED ORDER — OXYCODONE-ACETAMINOPHEN 5-325 MG PO TABS: 2.0000 | ORAL_TABLET | ORAL | Status: DC | PRN

## 2014-02-02 MED ORDER — IOHEXOL 300 MG/ML  SOLN
100.0000 mL | Freq: Once | INTRAMUSCULAR | Status: AC | PRN
Start: 2014-02-02 — End: 2014-02-02
  Administered 2014-02-02: 100 mL via INTRAVENOUS

## 2014-02-02 NOTE — ED Notes (Signed)
Patient states she has had lower right abdominal pain since waking up four days ago which was associated with nausea.  Nausea has resided except with increased pain.  States yesterday she had two loose stools.  States she feels relief from pain with passing flatulence. Describes the RLQ pain as cramps, which are constant, pain level currently at 5/10, intermittently pain increases to a 10/10.  Hx of oophorectomy on right 13 yrs ago.

## 2014-02-02 NOTE — Discharge Instructions (Signed)
Abdominal Pain, Adult Many things can cause abdominal pain. Usually, abdominal pain is not caused by a disease and will improve without treatment. It can often be observed and treated at home. Your health care provider will do a physical exam and possibly order blood tests and X-rays to help determine the seriousness of your pain. However, in many cases, more time must pass before a clear cause of the pain can be found. Before that point, your health care provider may not know if you need more testing or further treatment. HOME CARE INSTRUCTIONS  Monitor your abdominal pain for any changes. The following actions may help to alleviate any discomfort you are experiencing:  Only take over-the-counter or prescription medicines as directed by your health care provider.  Do not take laxatives unless directed to do so by your health care provider.  Try a clear liquid diet (broth, tea, or water) as directed by your health care provider. Slowly move to a bland diet as tolerated. SEEK MEDICAL CARE IF:  You have unexplained abdominal pain.  You have abdominal pain associated with nausea or diarrhea.  You have pain when you urinate or have a bowel movement.  You experience abdominal pain that wakes you in the night.  You have abdominal pain that is worsened or improved by eating food.  You have abdominal pain that is worsened with eating fatty foods. SEEK IMMEDIATE MEDICAL CARE IF:   Your pain does not go away within 2 hours.  You have a fever.  You keep throwing up (vomiting).  Your pain is felt only in portions of the abdomen, such as the right side or the left lower portion of the abdomen.  You pass bloody or black tarry stools. MAKE SURE YOU:  Understand these instructions.   Will watch your condition.   Will get help right away if you are not doing well or get worse.  Document Released: 06/26/2005 Document Revised: 07/07/2013 Document Reviewed: 05/26/2013 [facility] Patient  Information 2014 [place]. Ovarian Tumors The ovaries are small organs that produce eggs in women. They lie on each side of the uterus. Tumors are solid growths on the ovary, not like ovarian cysts that are filled with fluid. They can be cancerous or noncancerous. All solid tumors should be looked at to make sure they are not cancerous tumors.  RISK FACTORS There are no known causes for ovarian tumors. However, there are several risk factors for developing cancerous tumors on the ovary, such as:  Age.  [place] or [place] European descent.  Personal or family history of ovarian, colon, or breast cancer.  Presence of BRCA1 or BRCA2 genes.  Use of fertility medicines.  Late menopause (older than 50 years).  Pregnancy for the first time at age 80 years or older. SIGNS AND SYMPTOMS  In many cases there are no symptoms. Noncancerous tumors usually have no symptoms, but cancerous tumors may have symptoms that are minor and resemble other health problems. The following are symptoms of ovarian tumors:  Unexplained weight loss.  Increased abdominal size.  Belly (abdominal) pain.  Back or pelvis pain or pressure.  Tiredness.  Abnormal vaginal bleeding.  Loss of appetite.  Frequent urination or pressure on your bladder.  Indigestion, increased gas, and bloating.  Painful sexual intercourse. DIAGNOSIS  The following test may be done to help diagnose ovarian tumors:  An ultrasound exam.  Imaging exams, such as an X-ray exam, CT scan, or MRI.  Blood tests. TREATMENT   The tumor will be  studied in the lab under a microscope to see if it is cancer.  Noncancerous tumors can be removed surgically with or without removing the ovary.  Cancerous tumors usually are removed with the ovary and sometimes both ovaries are removed with the fallopian tubes, uterus, and surrounding lymph nodes to see if the cancer has spread.  Cancerous tumors may also be treated along with  surgery and radiation, chemotherapy, or both. HOME CARE INSTRUCTIONS   Follow your health care provider's advice and recommendations regarding medicine and follow-up care.  Get a yearly physical and gynecology exam. This includes a pelvic exam if you are aged 89 years or older. SEEK MEDICAL CARE IF:   You have any of the above symptoms that have not gone away after a week of treatment.  You are losing weight for no reason.  You feel generally ill. Document Released: 06/25/2008 Document Revised: 07/07/2013 Document Reviewed: 04/15/2013 [facility] Patient Information 2014 [place], [place]. Cholelithiasis Cholelithiasis (also called gallstones) is a form of gallbladder disease in which gallstones form in your gallbladder. The gallbladder is an organ that stores bile made in the liver, which helps digest fats. Gallstones begin as small crystals and slowly grow into stones. Gallstone pain occurs when the gallbladder spasms and a gallstone is blocking the duct. Pain can also occur when a stone passes out of the duct.  RISK FACTORS  Being female.   Having multiple pregnancies. Health care providers sometimes advise removing diseased gallbladders before future pregnancies.   Being obese.  Eating a diet heavy in fried foods and fat.   Being older than 15 years and increasing age.   Prolonged use of medicines containing female hormones.   Having diabetes mellitus.   Rapidly losing weight.   Having a family history of gallstones (heredity).  SYMPTOMS  Nausea.   Vomiting.  Abdominal pain.   Yellowing of the skin (jaundice).   Sudden pain. It may persist from several minutes to several hours.  Fever.   Tenderness to the touch. In some cases, when gallstones do not move into the bile duct, people have no pain or symptoms. These are called "silent" gallstones.  TREATMENT Silent gallstones do not need treatment. In severe cases, emergency surgery may be required.  Options for treatment include:  Surgery to remove the gallbladder. This is the most common treatment.  Medicines. These do not always work and may take 6 12 months or more to work.  Shock wave treatment (extracorporeal biliary lithotripsy). In this treatment an ultrasound machine sends shock waves to the gallbladder to break gallstones into smaller pieces that can pass into the intestines or be dissolved by medicine. HOME CARE INSTRUCTIONS   Only take over-the-counter or prescription medicines for pain, discomfort, or fever as directed by your health care provider.   Follow a low-fat diet until seen again by your health care provider. Fat causes the gallbladder to contract, which can result in pain.   Follow up with your health care provider as directed. Attacks are almost always recurrent and surgery is usually required for permanent treatment.  SEEK IMMEDIATE MEDICAL CARE IF:   Your pain increases and is not controlled by medicines.   You have a fever or persistent symptoms for more than 2 3 days.   You have a fever and your symptoms suddenly get worse.   You have persistent nausea and vomiting.  MAKE SURE YOU:   Understand these instructions.  Will watch your condition.  Will get help right away if you  are not doing well or get worse. Document Released: 09/12/2005 Document Revised: 05/19/2013 Document Reviewed: 03/10/2013 [facility] Patient Information 2014 [place].

## 2014-02-02 NOTE — ED Provider Notes (Signed)
CSN: [id number]     Arrival date & time 02/02/14  1128 History   First MD Initiated Contact with Patient 02/02/14 1207     Chief Complaint  Patient presents with  . Abdominal Pain     (Consider location/radiation/quality/duration/timing/severity/associated sxs/prior Treatment) Patient is a 38 y.o. female presenting with abdominal pain. The history is provided by the patient. No language interpreter was used.  Abdominal Pain Pain location:  RLQ Pain quality: aching, bloating and shooting   Pain radiates to:  Does not radiate Pain severity:  Moderate Onset quality:  Gradual Duration:  4 days Timing:  Constant Progression:  Worsening Chronicity:  New Context: not alcohol use   Relieved by:  Nothing Ineffective treatments:  None tried Associated symptoms: nausea   Associated symptoms: no diarrhea and no vomiting   Risk factors: multiple surgeries   Risk factors: no alcohol abuse, no aspirin use and not pregnant     Past Medical History  Diagnosis Date  . Unspecified essential hypertension   . Proteinuria   . Excessive or frequent menstruation   . Anemia, unspecified   . Type II or unspecified type diabetes mellitus without mention of complication, not stated as uncontrolled   . Morbid obesity   . Obstructive sleep apnea 02/11/11    Sleep study: severe OSA- rec CPAP 20cm small  full face mask   Past Surgical History  Procedure Laterality Date  . Cesarean section    . Ectopic pregnancy surgery      x 2  . Cyst removal    . Cleft palate repair    . Oophorectomy Right 2002  . Ablation colpoclesis     Family History  Problem Relation Age of Onset  . Cancer Father     oral cancer  . Heart attack Maternal Aunt    History  Substance Use Topics  . Smoking status: Current Every Day Smoker -- 0.50 packs/day for 10 years    Types: Cigarettes  . Smokeless tobacco: Never Used  . Alcohol Use: Yes     Comment: occasional use   OB History   Grav Para Term Preterm Abortions  TAB SAB Ect Mult Living                 Review of Systems  Gastrointestinal: Positive for nausea and abdominal pain. Negative for vomiting and diarrhea.  All other systems reviewed and are negative.     Allergies  Ibuprofen; Labetalol; and Aspirin  Home Medications   Prior to Admission medications   Medication Sig Start Date End Date Taking? Authorizing Provider  esomeprazole (NEXIUM) 40 MG capsule Take 1 capsule (40 mg total) by mouth daily before breakfast. 08/12/12   [name], NP  glucose blood (ACCU-CHEK AVIVA) test strip Use to check blood sugar two times daily. 01/14/11   [name], NP  insulin aspart (NOVOLOG FLEXPEN) 100 UNIT/ML SOPN FlexPen Inject 5 Units into the skin daily before lunch.    Historical Provider, MD  insulin glargine (LANTUS SOLOSTAR) 100 UNIT/ML injection 23 units SQ QHS 11/17/12   [name], NP  Insulin Pen Needle (BD ULTRA-FINE PEN NEEDLES) 29G X 12.7MM MISC Use with lantus solostar for insulin injections. 12/17/12   [name], NP  INSULIN SYRINGE 1CC/29G ([place] 1CC/29G) 29G X 1/2" 1 ML MISC Use for insulin injection once a day. 08/14/12   [name], NP  Lancets (ACCU-CHEK MULTICLIX) lancets Use as instructed to check blood sugar twice a day.     Historical  Provider, MD  lisinopril (PRINIVIL,ZESTRIL) 10 MG tablet Take 1 tablet (10 mg total) by mouth daily. 10/11/13   [name], NP  metFORMIN (GLUCOPHAGE-XR) 500 MG 24 hr tablet Take 4 tablets by mouth once a day. 04/30/13   [name], NP  sitaGLIPtin (JANUVIA) 100 MG tablet Take 1 tablet (100 mg total) by mouth daily. 08/12/12 08/12/13  [name], NP   BP 156/93  Temp(Src) 97.9 F (36.6 C) (Oral)  Resp 20  Ht 5\' 6"  (1.676 m)  Wt 310 lb (140.615 kg)  BMI 50.06 kg/m2  SpO2 98% Physical Exam  Nursing note and vitals reviewed. Constitutional: She is oriented to person, place, and time. She appears well-developed and  well-nourished.  HENT:  Head: Normocephalic.  Nose: Nose normal.  Mouth/Throat: Oropharynx is clear and moist.  Eyes: Conjunctivae and EOM are normal. Pupils are equal, round, and reactive to light.  Neck: Normal range of motion.  Cardiovascular: Normal rate and normal heart sounds.   Pulmonary/Chest: Effort normal and breath sounds normal.  Abdominal: Soft. She exhibits no distension. There is tenderness.  Musculoskeletal: Normal range of motion.  Neurological: She is alert and oriented to person, place, and time.  Skin: Skin is warm.  Psychiatric: She has a normal mood and affect.    ED Course  Procedures (including critical care time) Labs Review Labs Reviewed  URINALYSIS, ROUTINE W REFLEX MICROSCOPIC - Abnormal; Notable for the following:    Color, Urine AMBER (*)    APPearance CLOUDY (*)    Specific Gravity, Urine 1.034 (*)    Glucose, UA >1000 (*)    Bilirubin Urine SMALL (*)    Ketones, ur 15 (*)    Protein, ur 30 (*)    Leukocytes, UA SMALL (*)    All other components within normal limits  URINE MICROSCOPIC-ADD ON - Abnormal; Notable for the following:    Squamous Epithelial / LPF FEW (*)    All other components within normal limits  CBC WITH DIFFERENTIAL - Abnormal; Notable for the following:    Hemoglobin 15.3 (*)    All other components within normal limits  COMPREHENSIVE METABOLIC PANEL - Abnormal; Notable for the following:    Sodium 135 (*)    Glucose, Bld 290 (*)    Creatinine, Ser 0.40 (*)    Albumin 3.3 (*)    All other components within normal limits  LIPASE, BLOOD - Abnormal; Notable for the following:    Lipase 10 (*)    All other components within normal limits  PREGNANCY, URINE    Imaging Review Ct Abdomen Pelvis W Contrast  02/02/2014   CLINICAL DATA:  Abdominal pain and bloating. Apparent history of right oophorectomy.  EXAM: CT ABDOMEN AND PELVIS WITH CONTRAST  TECHNIQUE: Multidetector CT imaging of the abdomen and pelvis was performed using  the standard protocol following bolus administration of intravenous contrast.  CONTRAST:  23mL OMNIPAQUE IOHEXOL 300 MG/ML SOLN, 113mL OMNIPAQUE IOHEXOL 300 MG/ML SOLN  COMPARISON:  None.  FINDINGS: The liver shows diffuse steatosis without evidence of mass or biliary ductal dilatation. The gallbladder is filled with multiple partially calcified gallstones. There is no evidence of gallbladder dilatation or surrounding inflammation.  The spleen, pancreas, adrenal glands and kidneys are within normal limits. There is no evidence of bowel obstruction. The appendix is normal.  In the right adnexal region of the pelvis, gross enlargement with predominantly cystic abnormality measures approximately 8.0 cm. This area is somewhat tubular in appearance and may represent hydrosalpinx or PID.  Pelvic ultrasound examination may be helpful. There also appears to be some adjacent free fluid in the pelvis.  The uterus contains some focal fluid in the fundus with appearance of fluid potentially representing a septate or bicornuate uterus. Left adnexal region appears unremarkable by CT. The bladder is decompressed. No hernias are identified. No enlarged lymph nodes are seen. No vascular abnormalities. Bony structures are unremarkable.  IMPRESSION: 1. Abnormality in the region of the right adnexa which by CT is suggestive of hydrosalpinx or potentially PID. The right ovary apparently has been removed in the past. Sonographic evaluation may be helpful for further delineation. 2. Hepatic steatosis. 3. Cholelithiasis with multiple gallstones identified.   Electronically Signed   By: Aletta Edouard M.D.   On: 02/02/2014 13:29     EKG Interpretation None       Ct shows right ovary mass.   Radiologist advised ultrasound.   Ct shows gallstones.    Abdominal ultrasound delayed (Ultrasound tech had emergency)  Pt agreed to wait for ultrasound.   Ultrasound shows probable cyst.   I counseled pt on reults She has seen Dr. Micah Noel in the  past.  Pt advised to schedule to see him for further evaluation.   Pt referred to [place] [facility] for evaluation of gallstones.   Pt advised to return if pain worsen or cahnges   Final diagnoses:  Adnexal mass        Fransico Meadow, PA-C 02/02/14 2138

## 2014-02-03 ENCOUNTER — Other Ambulatory Visit: Payer: BC Managed Care – PPO

## 2014-02-06 NOTE — ED Provider Notes (Signed)
Medical screening examination/treatment/procedure(s) were performed by non-physician practitioner and as supervising physician I was immediately available for consultation/collaboration.   EKG Interpretation None       Varney Biles, MD 02/06/14 2305

## 2014-02-08 ENCOUNTER — Ambulatory Visit
Admission: RE | Admit: 2014-02-08 | Discharge: 2014-02-08 | Disposition: A | Discharge status: Home / Self Care | Payer: BC Managed Care – PPO | Source: Ambulatory Visit | Attending: Family | Admitting: Family

## 2014-02-08 DIAGNOSIS — N6310 Unspecified lump in the right breast, unspecified quadrant: Secondary | ICD-10-CM

## 2014-02-09 ENCOUNTER — Ambulatory Visit (INDEPENDENT_AMBULATORY_CARE_PROVIDER_SITE_OTHER): Payer: BC Managed Care – PPO | Admitting: General Surgery

## 2014-02-09 ENCOUNTER — Encounter (INDEPENDENT_AMBULATORY_CARE_PROVIDER_SITE_OTHER): Payer: Self-pay | Admitting: General Surgery

## 2014-02-09 VITALS — BP 130/82 | HR 90 | Temp 97.2°F | Ht 66.0 in | Wt 301.0 lb

## 2014-02-09 DIAGNOSIS — L732 Hidradenitis suppurativa: Secondary | ICD-10-CM

## 2014-02-09 DIAGNOSIS — N611 Abscess of the breast and nipple: Secondary | ICD-10-CM

## 2014-02-09 DIAGNOSIS — N61 Mastitis without abscess: Secondary | ICD-10-CM

## 2014-02-09 MED ORDER — DOXYCYCLINE HYCLATE 100 MG PO TABS: 100.0000 mg | ORAL_TABLET | Freq: Two times a day (BID) | ORAL | Status: DC

## 2014-02-09 NOTE — Patient Instructions (Signed)
Take 2 weeks more of doxycycline.    Follow up in 3 weeks.

## 2014-02-09 NOTE — Assessment & Plan Note (Signed)
There may still be some residual fluid present on ultrasound, but I cannot appreciate an abscess clinically from the external skin. Since I cannot tell where to incise, I am not going to perform an I&D at this time.  I am giving her an additional 2 weeks of antibiotics given the residual fluid and her history of hydradenitis. I have written for doxycycline. I will see her back in approximately 3 weeks.

## 2014-02-09 NOTE — Progress Notes (Signed)
Patient ID: Catherine Espinoza, female   DOB: Aug 03, 1976, 38 y.o.   MRN: [id number]  Chief Complaint  Patient presents with  . eval right bresat abscess    HPI Catherine Espinoza is a 38 y.o. female.   HPI  Patient is a 38 year old female who is referred by [name] and Dr. Purcell Nails at the breast center for consultation regarding a right breast lesion. She had an abscess that was treated with doxycycline. She underwent ultrasound for this and did demonstrate a fluid collection. Upon followup yesterday she did still have some fluid present with echogenic material. However she has had a resolution of her symptoms. She described redness, significant pain, and swelling of her medial inferior right breast at the border of the areola. She states that the symptoms have improved. She denies ever having any fevers and chills.  Past Medical History  Diagnosis Date  . Unspecified essential hypertension   . Proteinuria   . Excessive or frequent menstruation   . Anemia, unspecified   . Type II or unspecified type diabetes mellitus without mention of complication, not stated as uncontrolled   . Morbid obesity   . Obstructive sleep apnea 02/11/11    Sleep study: severe OSA- rec CPAP 20cm small  full face mask    Past Surgical History  Procedure Laterality Date  . Cesarean section    . Ectopic pregnancy surgery      x 2  . Cyst removal    . Cleft palate repair    . Oophorectomy Right 2002  . Ablation colpoclesis      Family History  Problem Relation Age of Onset  . Cancer Father     oral cancer  . Heart attack Maternal Aunt     Social History History  Substance Use Topics  . Smoking status: Current Every Day Smoker -- 0.50 packs/day for 10 years    Types: Cigarettes  . Smokeless tobacco: Never Used  . Alcohol Use: Yes     Comment: occasional use    Allergies  Allergen Reactions  . Ibuprofen     REACTION: knots in mouth with SOB  . Labetalol Nausea Only  . Aspirin     Knots  in mouth     Current Outpatient Prescriptions  Medication Sig Dispense Refill  . esomeprazole (NEXIUM) 40 MG capsule Take 1 capsule (40 mg total) by mouth daily before breakfast.  30 capsule  5  . glucose blood (ACCU-CHEK AVIVA) test strip Use to check blood sugar two times daily.  100 each  2  . insulin aspart (NOVOLOG FLEXPEN) 100 UNIT/ML SOPN FlexPen Inject 5 Units into the skin daily before lunch.      . insulin glargine (LANTUS SOLOSTAR) 100 UNIT/ML injection 23 units SQ QHS  5 pen  5  . Insulin Pen Needle (BD ULTRA-FINE PEN NEEDLES) 29G X 12.7MM MISC Use with lantus solostar for insulin injections.  100 each  1  . INSULIN SYRINGE 1CC/29G (SUNMARK INSULIN SYR 1CC/29G) 29G X 1/2" 1 ML MISC Use for insulin injection once a day.  100 each  2  . Lancets (ACCU-CHEK MULTICLIX) lancets Use as instructed to check blood sugar twice a day.       . lisinopril (PRINIVIL,ZESTRIL) 10 MG tablet Take 1 tablet (10 mg total) by mouth daily.  30 tablet  1  . metFORMIN (GLUCOPHAGE-XR) 500 MG 24 hr tablet Take 4 tablets by mouth once a day.  120 tablet  3  . oxyCODONE-acetaminophen (PERCOCET/ROXICET) 5-325 MG per  tablet Take 2 tablets by mouth every 4 (four) hours as needed for severe pain.  15 tablet  0  . doxycycline (VIBRA-TABS) 100 MG tablet Take 1 tablet (100 mg total) by mouth 2 (two) times daily.  28 tablet  2  . sitaGLIPtin (JANUVIA) 100 MG tablet Take 1 tablet (100 mg total) by mouth daily.  30 tablet  3   No current facility-administered medications for this visit.    Review of Systems Review of Systems  All other systems reviewed and are negative.   Blood pressure 130/82, pulse 90, temperature 97.2 F (36.2 C), height 5\' 6"  (1.676 m), weight 301 lb (136.533 kg).  Physical Exam Physical Exam  Constitutional: She is oriented to person, place, and time. She appears well-developed and well-nourished. No distress.  HENT:  Head: Normocephalic and atraumatic.  Eyes: Conjunctivae are normal.  Pupils are equal, round, and reactive to light. Right eye exhibits no discharge. Left eye exhibits no discharge. No scleral icterus.  Cardiovascular: Normal rate.   Pulmonary/Chest: Effort normal. No respiratory distress. Right breast exhibits skin change (some hyperpigmentation). Right breast exhibits no inverted nipple, no mass, no nipple discharge and no tenderness. Left breast exhibits no inverted nipple, no mass, no nipple discharge, no skin change and no tenderness.    Numerous former hidradenitis spots.  Right axilla also with some scarring. No warmth or fluctuance.  No masses.    Neurological: She is alert and oriented to person, place, and time. Coordination normal.  Skin: Skin is warm and dry. Rash (numerous areas of healed over hidradenitis.) noted. She is not diaphoretic. No erythema. No pallor.  Psychiatric: She has a normal mood and affect. Her behavior is normal. Judgment and thought content normal.     Assessment/plan Abscess of right breast There may still be some residual fluid present on ultrasound, but I cannot appreciate an abscess clinically from the external skin. Since I cannot tell where to incise, I am not going to perform an I&D at this time.  I am giving her an additional 2 weeks of antibiotics given the residual fluid and her history of hydradenitis. I have written for doxycycline. I will see her back in approximately 3 weeks.     Stark Klein 02/09/2014, 3:57 PM

## 2014-02-28 ENCOUNTER — Ambulatory Visit: Payer: BC Managed Care – PPO | Admitting: Family

## 2014-03-08 ENCOUNTER — Encounter (INDEPENDENT_AMBULATORY_CARE_PROVIDER_SITE_OTHER): Payer: BC Managed Care – PPO | Admitting: General Surgery

## 2014-03-18 DIAGNOSIS — D4959 Neoplasm of unspecified behavior of other genitourinary organ: Secondary | ICD-10-CM | POA: Insufficient documentation

## 2014-03-21 ENCOUNTER — Ambulatory Visit: Payer: BC Managed Care – PPO | Admitting: Family

## 2014-04-05 DIAGNOSIS — N9489 Other specified conditions associated with female genital organs and menstrual cycle: Secondary | ICD-10-CM | POA: Insufficient documentation

## 2014-04-11 ENCOUNTER — Telehealth: Payer: Self-pay

## 2014-04-11 DIAGNOSIS — E785 Hyperlipidemia, unspecified: Secondary | ICD-10-CM

## 2014-04-11 DIAGNOSIS — IMO0001 Reserved for inherently not codable concepts without codable children: Secondary | ICD-10-CM

## 2014-04-11 DIAGNOSIS — E1165 Type 2 diabetes mellitus with hyperglycemia: Principal | ICD-10-CM

## 2014-04-11 NOTE — Telephone Encounter (Signed)
Diabetic Bundle- labs ordered and message left for pt  I tried to call vm (vm full) I called work number but phone just rings  I did try to order the labs though and it shows an A1C ordered by Linna Darner for 04-20-14

## 2014-04-18 ENCOUNTER — Other Ambulatory Visit: Payer: Self-pay | Admitting: Family

## 2014-08-05 ENCOUNTER — Emergency Department (HOSPITAL_BASED_OUTPATIENT_CLINIC_OR_DEPARTMENT_OTHER): Payer: BC Managed Care – PPO

## 2014-08-05 ENCOUNTER — Emergency Department (HOSPITAL_BASED_OUTPATIENT_CLINIC_OR_DEPARTMENT_OTHER)
Admission: EM | Admit: 2014-08-05 | Discharge: 2014-08-05 | Disposition: A | Discharge status: Home / Self Care | Payer: BC Managed Care – PPO | Attending: Emergency Medicine | Admitting: Emergency Medicine

## 2014-08-05 ENCOUNTER — Encounter (HOSPITAL_BASED_OUTPATIENT_CLINIC_OR_DEPARTMENT_OTHER): Payer: Self-pay | Admitting: *Deleted

## 2014-08-05 DIAGNOSIS — R000 Tachycardia, unspecified: Secondary | ICD-10-CM | POA: Insufficient documentation

## 2014-08-05 DIAGNOSIS — Z792 Long term (current) use of antibiotics: Secondary | ICD-10-CM | POA: Insufficient documentation

## 2014-08-05 DIAGNOSIS — J069 Acute upper respiratory infection, unspecified: Secondary | ICD-10-CM | POA: Diagnosis not present

## 2014-08-05 DIAGNOSIS — Z794 Long term (current) use of insulin: Secondary | ICD-10-CM | POA: Insufficient documentation

## 2014-08-05 DIAGNOSIS — Z79899 Other long term (current) drug therapy: Secondary | ICD-10-CM | POA: Diagnosis not present

## 2014-08-05 DIAGNOSIS — Z720 Tobacco use: Secondary | ICD-10-CM | POA: Insufficient documentation

## 2014-08-05 DIAGNOSIS — Z87448 Personal history of other diseases of urinary system: Secondary | ICD-10-CM | POA: Diagnosis not present

## 2014-08-05 DIAGNOSIS — E119 Type 2 diabetes mellitus without complications: Secondary | ICD-10-CM | POA: Insufficient documentation

## 2014-08-05 DIAGNOSIS — Z79891 Long term (current) use of opiate analgesic: Secondary | ICD-10-CM | POA: Insufficient documentation

## 2014-08-05 DIAGNOSIS — Z9981 Dependence on supplemental oxygen: Secondary | ICD-10-CM | POA: Insufficient documentation

## 2014-08-05 DIAGNOSIS — R109 Unspecified abdominal pain: Secondary | ICD-10-CM | POA: Diagnosis not present

## 2014-08-05 DIAGNOSIS — G4733 Obstructive sleep apnea (adult) (pediatric): Secondary | ICD-10-CM | POA: Diagnosis not present

## 2014-08-05 DIAGNOSIS — Z862 Personal history of diseases of the blood and blood-forming organs and certain disorders involving the immune mechanism: Secondary | ICD-10-CM | POA: Insufficient documentation

## 2014-08-05 DIAGNOSIS — I10 Essential (primary) hypertension: Secondary | ICD-10-CM | POA: Diagnosis not present

## 2014-08-05 DIAGNOSIS — R05 Cough: Secondary | ICD-10-CM | POA: Diagnosis present

## 2014-08-05 MED ORDER — HYDROCOD POLST-CHLORPHEN POLST 10-8 MG/5ML PO LQCR: 5.0000 mL | Freq: Two times a day (BID) | ORAL | Status: DC | PRN

## 2014-08-05 MED ORDER — PREDNISONE 20 MG PO TABS: 60.0000 mg | ORAL_TABLET | Freq: Every day | ORAL | Status: AC

## 2014-08-05 MED ORDER — PREDNISONE 50 MG PO TABS
60.0000 mg | ORAL_TABLET | Freq: Once | ORAL | Status: AC
  Administered 2014-08-05: 60 mg via ORAL
  Filled 2014-08-05 (×2): qty 1

## 2014-08-05 NOTE — ED Notes (Signed)
Cough and headache x 3 days. States she has coughed so much her abdominal muscles hurt.

## 2014-08-05 NOTE — ED Provider Notes (Signed)
CSN: [id number]     Arrival date & time 08/05/14  1608 History   First MD Initiated Contact with Patient 08/05/14 1628     Chief Complaint  Patient presents with  . URI      HPI  Patient presents with concern of cough, congestion, myalgia. Symptoms began 3 days ago.  Since onset no relief with OTC medication. Patient describes diffuse discomfort, as well as abdominal discomfort with coughing. No vomiting, diarrhea, fever, chills. Patient states that one year ago she had similar symptoms, had pneumonia.  Patient specifically denies chest pain, exertional or pleuritic changes, lightheadedness, syncope.    Past Medical History  Diagnosis Date  . Unspecified essential hypertension   . Proteinuria   . Excessive or frequent menstruation   . Anemia, unspecified   . Type II or unspecified type diabetes mellitus without mention of complication, not stated as uncontrolled   . Morbid obesity   . Obstructive sleep apnea 02/11/11    Sleep study: severe OSA- rec CPAP 20cm small  full face mask   Past Surgical History  Procedure Laterality Date  . Cesarean section    . Ectopic pregnancy surgery      x 2  . Cyst removal    . Cleft palate repair    . Oophorectomy Right 2002  . Ablation colpoclesis    . Abdominal hysterectomy     Family History  Problem Relation Age of Onset  . Cancer Father     oral cancer  . Heart attack Maternal Aunt    History  Substance Use Topics  . Smoking status: Current Every Day Smoker -- 0.50 packs/day for 10 years    Types: Cigarettes  . Smokeless tobacco: Never Used  . Alcohol Use: Yes     Comment: occasional use   OB History    No data available     Review of Systems  Constitutional:       Per HPI, otherwise negative  HENT:       Per HPI, otherwise negative  Respiratory:       Per HPI, otherwise negative  Cardiovascular:       Per HPI, otherwise negative  Gastrointestinal: Negative for vomiting.  Endocrine:       Negative aside from  HPI  Genitourinary:       Neg aside from HPI   Musculoskeletal:       Per HPI, otherwise negative  Skin: Negative.   Neurological: Negative for syncope.      Allergies  Ibuprofen; Labetalol; and Aspirin  Home Medications   Prior to Admission medications   Medication Sig Start Date End Date Taking? Authorizing Provider  doxycycline (VIBRA-TABS) 100 MG tablet Take 1 tablet (100 mg total) by mouth 2 (two) times daily. 02/09/14   Stark Klein, MD  esomeprazole (NEXIUM) 40 MG capsule Take 1 capsule (40 mg total) by mouth daily before breakfast. 08/12/12   [name], NP  glucose blood (ACCU-CHEK AVIVA) test strip Use to check blood sugar two times daily. 01/14/11   [name], NP  insulin aspart (NOVOLOG FLEXPEN) 100 UNIT/ML SOPN FlexPen Inject 5 Units into the skin daily before lunch.    Historical Provider, MD  Insulin Pen Needle (BD ULTRA-FINE PEN NEEDLES) 29G X 12.7MM MISC Use with lantus solostar for insulin injections. 12/17/12   [name], NP  INSULIN SYRINGE 1CC/29G ([place] 1CC/29G) 29G X 1/2" 1 ML MISC Use for insulin injection once a day. 08/14/12   [name], NP  Lancets (ACCU-CHEK MULTICLIX) lancets Use as instructed to check blood sugar twice a day.     Historical Provider, MD  LANTUS SOLOSTAR 100 UNIT/ML Solostar Pen INJECT 23 UNITS SUBCUTANEOUSLY AT BEDTIME    [name], NP  lisinopril (PRINIVIL,ZESTRIL) 10 MG tablet Take 1 tablet (10 mg total) by mouth daily. 10/11/13   [name], NP  metFORMIN (GLUCOPHAGE-XR) 500 MG 24 hr tablet Take 4 tablets by mouth once a day. 04/30/13   [name], NP  oxyCODONE-acetaminophen (PERCOCET/ROXICET) 5-325 MG per tablet Take 2 tablets by mouth every 4 (four) hours as needed for severe pain. 02/02/14   Fransico Meadow, PA-C  sitaGLIPtin (JANUVIA) 100 MG tablet Take 1 tablet (100 mg total) by mouth daily. 08/12/12 08/12/13  [name], NP   BP 157/100 mmHg  Pulse 125   Temp(Src) 98.6 F (37 C) (Oral)  Resp 18  Ht 5\' 6"  (1.676 m)  Wt 285 lb (129.275 kg)  BMI 46.02 kg/m2  SpO2 95%  LMP 09/13/2011 Physical Exam  Constitutional: She is oriented to person, place, and time. She appears well-developed and well-nourished. No distress.  HENT:  Head: Normocephalic and atraumatic.  Eyes: Conjunctivae and EOM are normal.  Neck: No tracheal deviation present. No thyromegaly present.  Cardiovascular: Regular rhythm.  Tachycardia present.   Pulmonary/Chest: Effort normal. No stridor. Tachypnea noted. No respiratory distress. She has decreased breath sounds.  Abdominal: She exhibits no distension. There is no tenderness.  Musculoskeletal: She exhibits no edema.  No lower extremity edema, legs are symmetric  Neurological: She is alert and oriented to person, place, and time. No cranial nerve deficit.  Skin: Skin is warm and dry.  Psychiatric: She has a normal mood and affect.  Nursing note and vitals reviewed.   ED Course  Procedures (including critical care time)  Imaging Review I reviewed the image, agree with the interpretation.  EKG Interpretation   Date/Time:  Friday August 05 2014 16:22:58 EST Ventricular Rate:  123 PR Interval:  146 QRS Duration: 84 QT Interval:  322 QTC Calculation: 460 R Axis:   117 Text Interpretation:  Sinus tachycardia Left posterior fascicular block T  wave abnormality, consider inferior ischemia T wave abnormality, consider  anterolateral ischemia Abnormal ECG Sinus tachycardia T wave abnormality  Abnormal ekg Confirmed by Carmin Muskrat  MD [phone]) on 08/05/2014 4:29:24  PM     I discussed smoking cessation with the patient.  We discussed the need for it, specifically in regards to this illness.  MDM   This patient, smoker, presents with ongoing cough, congestion, headache.  Patient is mildly tachycardic, otherwise has reassuring vital signs, physical exam.  Patient's evaluation does not demonstrate  pneumonia. The patient has T-wave abnormality but, she has no active chest pain, no pleuritic or exertional pain, there is low suspicion for ongoing ACS.     Carmin Muskrat, MD 08/05/14 [phone]

## 2014-08-05 NOTE — Discharge Instructions (Signed)
As discussed, your evaluation today has been largely reassuring.  But, it is important that you monitor your condition carefully, and do not hesitate to return to the ED if you develop new, or concerning changes in your condition.  The single most important thing you can do is to curtail your smoking.  Otherwise, please follow-up with your physician for appropriate ongoing care.

## 2014-08-09 ENCOUNTER — Telehealth: Payer: Self-pay | Admitting: Family

## 2014-08-09 NOTE — Telephone Encounter (Signed)
Patient needs re-evaluation in the office first please.

## 2014-08-09 NOTE — Telephone Encounter (Signed)
Pt states she was in the er on Friday for upper respiratory infection, states she was given prednisone 8 tabs, pt wants to know if Catherine Espinoza can call her in another refill states the infection still has not cleared. [facility].predniSONE (DELTASONE) 20 MG tablet

## 2014-08-09 NOTE — Telephone Encounter (Signed)
Notified pt and scheduled appt for 08/11/14 at 10:45am. Pt declined appt for 08/10/10 with other Provider as PCP will be out of the office tomorrow. Advised pt to call for appt tomorrow if symptoms worsen and she voices understanding. States cough is some better but still persistent (non-productive).

## 2014-08-11 ENCOUNTER — Encounter: Payer: Self-pay | Admitting: Family

## 2014-08-11 ENCOUNTER — Ambulatory Visit (INDEPENDENT_AMBULATORY_CARE_PROVIDER_SITE_OTHER): Payer: BC Managed Care – PPO | Admitting: *Deleted

## 2014-08-11 ENCOUNTER — Ambulatory Visit (INDEPENDENT_AMBULATORY_CARE_PROVIDER_SITE_OTHER): Payer: BC Managed Care – PPO | Admitting: Family

## 2014-08-11 VITALS — BP 122/90 | HR 91 | Temp 97.8°F | Resp 18 | Ht 65.0 in | Wt 300.2 lb

## 2014-08-11 DIAGNOSIS — Z23 Encounter for immunization: Secondary | ICD-10-CM

## 2014-08-11 DIAGNOSIS — E785 Hyperlipidemia, unspecified: Secondary | ICD-10-CM

## 2014-08-11 DIAGNOSIS — J209 Acute bronchitis, unspecified: Secondary | ICD-10-CM

## 2014-08-11 DIAGNOSIS — E1165 Type 2 diabetes mellitus with hyperglycemia: Secondary | ICD-10-CM

## 2014-08-11 DIAGNOSIS — B351 Tinea unguium: Secondary | ICD-10-CM | POA: Insufficient documentation

## 2014-08-11 DIAGNOSIS — Z5181 Encounter for therapeutic drug level monitoring: Secondary | ICD-10-CM

## 2014-08-11 DIAGNOSIS — IMO0002 Reserved for concepts with insufficient information to code with codable children: Secondary | ICD-10-CM

## 2014-08-11 MED ORDER — AZITHROMYCIN 250 MG PO TABS: ORAL_TABLET | ORAL | Status: DC

## 2014-08-11 MED ORDER — INSULIN PEN NEEDLE 29G X 12.7MM MISC: Status: DC

## 2014-08-11 MED ORDER — INSULIN GLARGINE 100 UNIT/ML SOLOSTAR PEN: PEN_INJECTOR | SUBCUTANEOUS | Status: DC

## 2014-08-11 MED ORDER — OMEPRAZOLE 40 MG PO CPDR: 40.0000 mg | DELAYED_RELEASE_CAPSULE | Freq: Every day | ORAL | Status: DC

## 2014-08-11 MED ORDER — TERBINAFINE HCL 250 MG PO TABS: 250.0000 mg | ORAL_TABLET | Freq: Every day | ORAL | Status: DC

## 2014-08-11 NOTE — Assessment & Plan Note (Signed)
Initiate lamisil.  Repeat lft in 6 weeks.

## 2014-08-11 NOTE — Progress Notes (Signed)
   Subjective:    Patient ID: Catherine Espinoza, female    DOB: 18-Oct-1975, 38 y.o.   MRN: [id number]  HPI  Catherine Espinoza is a 38 yo female who was seen in the ED on 08/05/14 for URI.  Wearing CPAP, still smoking, ran out of Omeprazole 2 weeks ago.   Tried Prednisone with short term relief Tussionex helps short term Cough drops, Robutussin, Mucinex, and Tessalon were not helpful. CXR on 08/05/14 shows some scarring in both lobes.   Review of Systems  Constitutional: Negative for fever, chills, diaphoresis, appetite change and fatigue.  HENT: Positive for rhinorrhea. Negative for congestion, ear discharge, ear pain, sinus pressure, sneezing, sore throat and trouble swallowing.        Rhinorrhea in morning, Dry mouth  Eyes: Negative for pain, discharge, redness, itching and visual disturbance.  Respiratory: Positive for cough. Negative for chest tightness, shortness of breath and wheezing.   Gastrointestinal: Negative for nausea, vomiting, abdominal pain, diarrhea and constipation.  Allergic/Immunologic: Positive for environmental allergies.       Pollen and Cat dander  Neurological: Negative for headaches.       Objective:   Physical Exam  Constitutional: She is oriented to person, place, and time. She appears well-developed and well-nourished. No distress.  HENT:  Head: Normocephalic and atraumatic.  Right Ear: Tympanic membrane and ear canal normal.  Left Ear: Tympanic membrane and ear canal normal.  Mouth/Throat: No oropharyngeal exudate or posterior oropharyngeal edema.  Cardiovascular: Normal rate and regular rhythm.   No murmur heard. Pulmonary/Chest: Effort normal and breath sounds normal. No respiratory distress. She has no wheezes. She has no rales. She exhibits no tenderness.  Musculoskeletal: She exhibits no edema.  Neurological: She is alert and oriented to person, place, and time.  Psychiatric: She has a normal mood and affect. Her behavior is normal. Judgment and thought  content normal.          Assessment & Plan:

## 2014-08-11 NOTE — Progress Notes (Signed)
Pre visit review using our clinic review tool, if applicable. No additional management support is needed unless otherwise documented below in the visit note. 

## 2014-08-11 NOTE — Progress Notes (Signed)
I have personally seen and examined Mr. Maheux along with Lorane Gell NP for training/orientation purposes. I agree with Ms Flonnie Overman' assessment and plan.

## 2014-08-11 NOTE — Assessment & Plan Note (Signed)
Given duration of symptoms, will rx with zithromax.  Urged smoking cessation. Follow up if symptoms worsen or if symptoms do not improve.

## 2014-08-11 NOTE — Patient Instructions (Addendum)
Restart omeprazole. Start z pak for bronchitis.  Complete lab work prior to leaving. Call if cough symptoms worsen or if symptoms do not improve. Start lamisil once daily for toenails. Schedule lab visit in 6 weeks so we can check your liver tests.  Follow up in 3 months.

## 2014-08-12 ENCOUNTER — Telehealth: Payer: Self-pay | Admitting: Family

## 2014-08-12 DIAGNOSIS — IMO0002 Reserved for concepts with insufficient information to code with codable children: Secondary | ICD-10-CM

## 2014-08-12 DIAGNOSIS — E785 Hyperlipidemia, unspecified: Secondary | ICD-10-CM

## 2014-08-12 DIAGNOSIS — E1165 Type 2 diabetes mellitus with hyperglycemia: Secondary | ICD-10-CM

## 2014-08-12 LAB — LIPID PANEL
Cholesterol: 222 mg/dL — ABNORMAL HIGH (ref 0–200)
HDL: 32.9 mg/dL — AB (ref >39.00)
NONHDL: 189.1
Total CHOL/HDL Ratio: 7
Triglycerides: 256 mg/dL — ABNORMAL HIGH (ref 0.0–149.0)
VLDL: 51.2 mg/dL — AB (ref 0.0–40.0)

## 2014-08-12 LAB — BASIC METABOLIC PANEL
BUN: 9 mg/dL (ref 6–23)
CALCIUM: 9.4 mg/dL (ref 8.4–10.5)
CO2: 28 meq/L (ref 19–32)
Chloride: 100 mEq/L (ref 96–112)
Creatinine, Ser: 0.7 mg/dL (ref 0.4–1.2)
GFR: 130.81 mL/min (ref >60.00)
Glucose, Bld: 298 mg/dL — ABNORMAL HIGH (ref 70–99)
POTASSIUM: 3.6 meq/L (ref 3.5–5.1)
Sodium: 137 mEq/L (ref 135–145)

## 2014-08-12 LAB — MICROALBUMIN / CREATININE URINE RATIO
Creatinine,U: 213.6 mg/dL
MICROALB UR: 5.4 mg/dL — AB (ref 0.0–1.9)
Microalb Creat Ratio: 2.5 mg/g (ref 0.0–30.0)

## 2014-08-12 LAB — HEPATIC FUNCTION PANEL
ALT: 22 U/L (ref 0–35)
AST: 14 U/L (ref 0–37)
Albumin: 3.2 g/dL — ABNORMAL LOW (ref 3.5–5.2)
Alkaline Phosphatase: 62 U/L (ref 39–117)
BILIRUBIN DIRECT: 0.1 mg/dL (ref 0.0–0.3)
BILIRUBIN TOTAL: 0.4 mg/dL (ref 0.2–1.2)
Total Protein: 7.4 g/dL (ref 6.0–8.3)

## 2014-08-12 LAB — HEMOGLOBIN A1C: Hgb A1c MFr Bld: 10.4 % — ABNORMAL HIGH (ref 4.6–6.5)

## 2014-08-12 LAB — LDL CHOLESTEROL, DIRECT: LDL DIRECT: 164.9 mg/dL

## 2014-08-12 MED ORDER — ATORVASTATIN CALCIUM 20 MG PO TABS: 20.0000 mg | ORAL_TABLET | Freq: Every day | ORAL | Status: DC

## 2014-08-12 NOTE — Telephone Encounter (Signed)
Sugar and cholesterol remain very uncontrolled.  I would like to refer pt to endocrinology for further management of her diabetes.  I would also like her to start atorvastatin for her cholesterol. Repeat FLP in 6 weeks.

## 2014-08-12 NOTE — Telephone Encounter (Signed)
Notified pt and she voices understanding. Pt has lab appt on 09/21/14 for LFTs and will do Lipids at that time. Future order entered.

## 2014-08-17 ENCOUNTER — Telehealth: Payer: Self-pay | Admitting: Family

## 2014-08-17 NOTE — Telephone Encounter (Signed)
emmi emailed °

## 2014-09-21 ENCOUNTER — Other Ambulatory Visit: Payer: BC Managed Care – PPO

## 2014-10-24 ENCOUNTER — Telehealth: Payer: Self-pay | Admitting: Family

## 2014-10-24 ENCOUNTER — Encounter: Payer: Self-pay | Admitting: Family

## 2014-10-24 ENCOUNTER — Ambulatory Visit (INDEPENDENT_AMBULATORY_CARE_PROVIDER_SITE_OTHER): Payer: BLUE CROSS/BLUE SHIELD | Admitting: Family

## 2014-10-24 VITALS — BP 108/70 | HR 121 | Temp 98.2°F | Resp 18 | Ht 65.0 in | Wt 296.2 lb

## 2014-10-24 DIAGNOSIS — L0291 Cutaneous abscess, unspecified: Secondary | ICD-10-CM

## 2014-10-24 DIAGNOSIS — L0390 Cellulitis, unspecified: Secondary | ICD-10-CM

## 2014-10-24 MED ORDER — DOXYCYCLINE HYCLATE 100 MG PO TABS: 100.0000 mg | ORAL_TABLET | Freq: Two times a day (BID) | ORAL | Status: DC

## 2014-10-24 NOTE — Assessment & Plan Note (Signed)
Rx for doxycycline. Will follow up with her in 2 days. If cellulitis has not improved, will refer for surgical consult.

## 2014-10-24 NOTE — Progress Notes (Signed)
Pre visit review using our clinic review tool, if applicable. No additional management support is needed unless otherwise documented below in the visit note. 

## 2014-10-24 NOTE — Patient Instructions (Signed)
Start doxycycline today. Call if increased pain, redness, swelling, fever or if not somewhat improved by Wednesday. You may use tylenol as needed for pain.  Follow up in 1 week.

## 2014-10-24 NOTE — Progress Notes (Signed)
Subjective:    Patient ID: Catherine Espinoza, female    DOB: May 13, 1976, 39 y.o.   MRN: [id number]  HPI Ms. Lowdermilk is here today for pain, redness and swelling of right breast x 3-4 days. Pt reports there is no "core" or soft part in center. Has not come to a head or drained.  She has been using warm, moist compresses al day for two days without improvement. Reports pain is 10/10. Has used tylenol without relief. Reports no injury to her breast. Patient has hx of diabetes.    Review of Systems  Constitutional: Negative for fever, chills and fatigue.  Psychiatric/Behavioral: Positive for sleep disturbance (related to pain in breast.).       Past Medical History  Diagnosis Date  . Unspecified essential hypertension   . Proteinuria   . Excessive or frequent menstruation   . Anemia, unspecified   . Type II or unspecified type diabetes mellitus without mention of complication, not stated as uncontrolled   . Morbid obesity   . Obstructive sleep apnea 02/11/11    Sleep study: severe OSA- rec CPAP 20cm small  full face mask    History   Social History  . Marital Status: Single    Spouse Name: N/A    Number of Children: N/A  . Years of Education: N/A   Occupational History  . cna/med tech    Social History Main Topics  . Smoking status: Current Every Day Smoker -- 0.50 packs/day for 10 years    Types: Cigarettes  . Smokeless tobacco: Never Used  . Alcohol Use: Yes     Comment: occasional use  . Drug Use: No  . Sexual Activity: Yes    Birth Control/ Protection: None   Other Topics Concern  . Not on file   Social History Narrative   Married-filing for divorced   48 year old daughter   Works as Industrial/product designer    Past Surgical History  Procedure Laterality Date  . Cesarean section    . Ectopic pregnancy surgery      x 2  . Cyst removal    . Cleft palate repair    . Oophorectomy Right 2002  . Ablation colpoclesis    . Abdominal hysterectomy      Family History    Problem Relation Age of Onset  . Cancer Father     oral cancer  . Heart attack Maternal Aunt     Allergies  Allergen Reactions  . Ibuprofen     REACTION: knots in mouth with SOB  . Labetalol Nausea Only  . Aspirin     Knots in mouth     Current Outpatient Prescriptions on File Prior to Visit  Medication Sig Dispense Refill  . atorvastatin (LIPITOR) 20 MG tablet Take 1 tablet (20 mg total) by mouth daily. 30 tablet 1  . glucose blood (ACCU-CHEK AVIVA) test strip Use to check blood sugar two times daily. 100 each 2  . insulin aspart (NOVOLOG FLEXPEN) 100 UNIT/ML SOPN FlexPen Inject 5 Units into the skin daily before lunch.    . Insulin Glargine (LANTUS SOLOSTAR) 100 UNIT/ML Solostar Pen INJECT 23 UNITS SUBCUTANEOUSLY AT BEDTIME 15 mL 0  . Insulin Pen Needle (BD ULTRA-FINE PEN NEEDLES) 29G X 12.7MM MISC Use with lantus solostar for insulin injections. 100 each 1  . INSULIN SYRINGE 1CC/29G (SUNMARK INSULIN SYR 1CC/29G) 29G X 1/2" 1 ML MISC Use for insulin injection once a day. 100 each 2  . Lancets (ACCU-CHEK MULTICLIX)  lancets Use as instructed to check blood sugar twice a day.     . lisinopril (PRINIVIL,ZESTRIL) 10 MG tablet Take 1 tablet (10 mg total) by mouth daily. 30 tablet 1  . metFORMIN (GLUCOPHAGE-XR) 500 MG 24 hr tablet Take 4 tablets by mouth once a day. 120 tablet 3  . omeprazole (PRILOSEC) 40 MG capsule Take 1 capsule (40 mg total) by mouth daily. 30 capsule 3  . terbinafine (LAMISIL) 250 MG tablet Take 1 tablet (250 mg total) by mouth daily. 30 tablet 1  . sitaGLIPtin (JANUVIA) 100 MG tablet Take 1 tablet (100 mg total) by mouth daily. 30 tablet 3   No current facility-administered medications on file prior to visit.    BP 108/70 mmHg  Pulse 121  Temp(Src) 98.2 F (36.8 C) (Oral)  Resp 18  Ht 5\' 5"  (1.651 m)  Wt 296 lb 3.2 oz (134.355 kg)  BMI 49.29 kg/m2  SpO2 95%  LMP 09/13/2011   Objective:   Physical Exam  Constitutional: She is oriented to person,  place, and time. She appears well-nourished. No distress.  Obese.  Cardiovascular: Normal rate, regular rhythm and normal heart sounds.  Exam reveals no gallop and no friction rub.   No murmur heard. Pulmonary/Chest: Effort normal and breath sounds normal. No respiratory distress. She has no wheezes. She has no rales.  Neurological: She is alert and oriented to person, place, and time.  Skin: Skin is warm and dry. She is not diaphoretic.  Approximately 7 cm round, erythematous, warm lesion that is hard to the touch on right breast at 1 o'clock slightly superior to nipple. No area of fluctuance noted.  Psychiatric: She has a normal mood and affect. Her behavior is normal. Judgment and thought content normal.  Vitals reviewed.           Assessment & Plan:  Patient seen along with Dawn Whitmire NP-student.  I have personally seen and examined patient and agree with Ms. Whitmire's assessment and plan- if not improved with PO abx in next 2 days, will plan referral to surgeon for [place] and further evaluation- pt to call me on Wednesday.  [name] NP

## 2014-10-24 NOTE — Telephone Encounter (Signed)
C/o:  Right lower breast boil near nipple.  Site is very painful (Rated pain "20/10"; unable to wear bra).  "Hard as a brick."  Surrounding beast tissue reddened, warm to touch and swollen.  Denies fever, chills and drainage.  Pt has tried applying heat.  No relief.  Pt was last seen for similar issue back in April of last year (01/24/14).     Advice:  Appointment scheduled today with [name], NP at 1:45 pm.

## 2014-10-28 ENCOUNTER — Telehealth: Payer: Self-pay | Admitting: Family

## 2014-10-28 MED ORDER — TRAMADOL HCL 50 MG PO TABS: 50.0000 mg | ORAL_TABLET | Freq: Three times a day (TID) | ORAL | Status: DC | PRN

## 2014-10-28 MED ORDER — SULFAMETHOXAZOLE-TRIMETHOPRIM 800-160 MG PO TABS: 1.0000 | ORAL_TABLET | Freq: Two times a day (BID) | ORAL | Status: DC

## 2014-10-28 NOTE — Telephone Encounter (Signed)
Caller name: [place], [place] Relation to pt: self  Call back number: [phone] Pharmacy: [facility], [place] [phone] (Phone)     Reason for call:  Pt states doxycycline (VIBRA-TABS) 100 MG tablet is not working requesting RX for pain in breast. Please advise

## 2014-10-28 NOTE — Telephone Encounter (Signed)
Left detailed message on voicemail re: below instructions and to call if any questions. Rx faxed to [facility].

## 2014-10-28 NOTE — Telephone Encounter (Signed)
Spoke with pt. Redness and tenderness still the same. Pain is no better. Has been on antibiotic since Monday. Pt has been taking 2 tylenol three times a day. Can't sleep at night due to the pain. Pt is requesting something to help her pain.  Please advise.

## 2014-10-28 NOTE — Telephone Encounter (Signed)
See rx for tramadol.  Pt should be re-evaluated in office on Monday, continue doxy warm compresses and add bactrim.  Go to ER if symptoms worsen over the weekend.

## 2014-10-31 ENCOUNTER — Encounter: Payer: Self-pay | Admitting: Family

## 2014-10-31 ENCOUNTER — Ambulatory Visit (INDEPENDENT_AMBULATORY_CARE_PROVIDER_SITE_OTHER): Payer: BLUE CROSS/BLUE SHIELD | Admitting: Family

## 2014-10-31 VITALS — BP 124/82 | HR 100 | Temp 98.1°F | Resp 18 | Ht 65.0 in | Wt 293.4 lb

## 2014-10-31 DIAGNOSIS — N611 Abscess of the breast and nipple: Secondary | ICD-10-CM

## 2014-10-31 DIAGNOSIS — N61 Inflammatory disorders of breast: Secondary | ICD-10-CM

## 2014-10-31 NOTE — Progress Notes (Signed)
Subjective:    Patient ID: Catherine Espinoza, female    DOB: 1975-10-31, 39 y.o.   MRN: [id number]  HPI Catherine Espinoza is here for follow up for cellulitis in her right breast. She was started on doxycycline 1/25 and bactrim was added 1/29 because she was not improving. She has been taking tramadol for pain with no relief.. Reports swelling and redness have worsened .Pain is 10/10 with throbbing. She has continued to work. Reports BS are running between 120-180 fasting.   Review of Systems Past Medical History  Diagnosis Date  . Unspecified essential hypertension   . Proteinuria   . Excessive or frequent menstruation   . Anemia, unspecified   . Type II or unspecified type diabetes mellitus without mention of complication, not stated as uncontrolled   . Morbid obesity   . Obstructive sleep apnea 02/11/11    Sleep study: severe OSA- rec CPAP 20cm small  full face mask    History   Social History  . Marital Status: Single    Spouse Name: N/A    Number of Children: N/A  . Years of Education: N/A   Occupational History  . cna/med tech    Social History Main Topics  . Smoking status: Current Every Day Smoker -- 0.50 packs/day for 10 years    Types: Cigarettes  . Smokeless tobacco: Never Used  . Alcohol Use: Yes     Comment: occasional use  . Drug Use: No  . Sexual Activity: Yes    Birth Control/ Protection: None   Other Topics Concern  . Not on file   Social History Narrative   Married-filing for divorced   70 year old daughter   Works as Industrial/product designer    Past Surgical History  Procedure Laterality Date  . Cesarean section    . Ectopic pregnancy surgery      x 2  . Cyst removal    . Cleft palate repair    . Oophorectomy Right 2002  . Ablation colpoclesis    . Abdominal hysterectomy      Family History  Problem Relation Age of Onset  . Cancer Father     oral cancer  . Heart attack Maternal Aunt     Allergies  Allergen Reactions  . Ibuprofen     REACTION:  knots in mouth with SOB  . Labetalol Nausea Only  . Aspirin     Knots in mouth     Current Outpatient Prescriptions on File Prior to Visit  Medication Sig Dispense Refill  . atorvastatin (LIPITOR) 20 MG tablet Take 1 tablet (20 mg total) by mouth daily. 30 tablet 1  . doxycycline (VIBRA-TABS) 100 MG tablet Take 1 tablet (100 mg total) by mouth 2 (two) times daily. 20 tablet 0  . glucose blood (ACCU-CHEK AVIVA) test strip Use to check blood sugar two times daily. 100 each 2  . insulin aspart (NOVOLOG FLEXPEN) 100 UNIT/ML SOPN FlexPen Inject 5 Units into the skin daily before lunch.    . Insulin Glargine (LANTUS SOLOSTAR) 100 UNIT/ML Solostar Pen INJECT 23 UNITS SUBCUTANEOUSLY AT BEDTIME 15 mL 0  . Insulin Pen Needle (BD ULTRA-FINE PEN NEEDLES) 29G X 12.7MM MISC Use with lantus solostar for insulin injections. 100 each 1  . INSULIN SYRINGE 1CC/29G (SUNMARK INSULIN SYR 1CC/29G) 29G X 1/2" 1 ML MISC Use for insulin injection once a day. 100 each 2  . Lancets (ACCU-CHEK MULTICLIX) lancets Use as instructed to check blood sugar twice a day.     Marland Kitchen  lisinopril (PRINIVIL,ZESTRIL) 10 MG tablet Take 1 tablet (10 mg total) by mouth daily. 30 tablet 1  . metFORMIN (GLUCOPHAGE-XR) 500 MG 24 hr tablet Take 4 tablets by mouth once a day. 120 tablet 3  . omeprazole (PRILOSEC) 40 MG capsule Take 1 capsule (40 mg total) by mouth daily. 30 capsule 3  . sulfamethoxazole-trimethoprim (BACTRIM DS,SEPTRA DS) 800-160 MG per tablet Take 1 tablet by mouth 2 (two) times daily. 14 tablet 0  . terbinafine (LAMISIL) 250 MG tablet Take 1 tablet (250 mg total) by mouth daily. 30 tablet 1  . traMADol (ULTRAM) 50 MG tablet Take 1 tablet (50 mg total) by mouth every 8 (eight) hours as needed. 15 tablet 0  . sitaGLIPtin (JANUVIA) 100 MG tablet Take 1 tablet (100 mg total) by mouth daily. 30 tablet 3   No current facility-administered medications on file prior to visit.    BP 124/82 mmHg  Pulse 100  Temp(Src) 98.1 F (36.7  C) (Oral)  Resp 18  Ht 5\' 5"  (1.651 m)  Wt 293 lb 6 oz (133.074 kg)  BMI 48.82 kg/m2  SpO2 95%  LMP 09/13/2011      Objective:   Physical Exam  Constitutional: No distress.  Cardiovascular: Normal rate, regular rhythm and normal heart sounds.  Exam reveals no gallop and no friction rub.   No murmur heard. Pulmonary/Chest: Effort normal and breath sounds normal. No respiratory distress. She has no wheezes. She has no rales.  Skin: Skin is warm and dry. She is not diaphoretic.  Upper medial portion of right breast is erythematous, warm, and hard including nipple area. Area extends about 10 cm above areola. It is approximately 13 cm wide.  Psychiatric: She has a normal mood and affect. Her behavior is normal. Judgment and thought content normal.            Assessment & Plan:  Patient seen along with [facility] NP-student.  I have personally seen and examined patient and agree with Catherine Espinoza's assessment and plan- Catherine Alar NP

## 2014-10-31 NOTE — Patient Instructions (Signed)
Please proceed directly to the [facility] room for evaluation for hospital admission due to failed outpatient antibiotics. Bring your paperwork from our office with you to give the doctor in the ED.

## 2014-10-31 NOTE — Assessment & Plan Note (Signed)
Patient has failed a 1 week course of outpatient antibiotics.  I advised direct admission to [facility]. She declines [facility] and would like to go to [facility]. I advised pt that since we are not affiliated with [facility], she will need to go to the [facility] ED for evaluation and then admission.  She prefers this plan.  She will need admission for IV antibiotics.

## 2014-10-31 NOTE — Progress Notes (Signed)
Pre visit review using our clinic review tool, if applicable. No additional management support is needed unless otherwise documented below in the visit note. 

## 2014-11-11 ENCOUNTER — Telehealth: Payer: Self-pay | Admitting: Family

## 2014-11-11 ENCOUNTER — Ambulatory Visit: Payer: BLUE CROSS/BLUE SHIELD | Admitting: Physician Assistant

## 2014-11-11 NOTE — Telephone Encounter (Signed)
Pt had a hospital fu today and she did not show up, do we need to call her or charge no show.

## 2014-11-12 NOTE — Telephone Encounter (Signed)
pls call her to reschedule.

## 2014-11-14 NOTE — Telephone Encounter (Signed)
Spoke with pt. She states she was unaware of the hospital follow up with [place]. States she is following up with the wound center at this time.  Scheduled pt follow up for 11/21/14 at 6:15 as pt requests appt after 3:30pm.

## 2014-11-21 ENCOUNTER — Ambulatory Visit: Payer: BLUE CROSS/BLUE SHIELD | Admitting: Family

## 2014-12-06 ENCOUNTER — Ambulatory Visit (INDEPENDENT_AMBULATORY_CARE_PROVIDER_SITE_OTHER): Payer: BLUE CROSS/BLUE SHIELD | Admitting: Family

## 2014-12-06 DIAGNOSIS — Z0000 Encounter for general adult medical examination without abnormal findings: Secondary | ICD-10-CM

## 2014-12-08 NOTE — Progress Notes (Signed)
Patient rescheduled due to provider running behind.

## 2014-12-19 ENCOUNTER — Encounter: Payer: Self-pay | Admitting: Family

## 2014-12-19 ENCOUNTER — Ambulatory Visit (INDEPENDENT_AMBULATORY_CARE_PROVIDER_SITE_OTHER): Payer: BLUE CROSS/BLUE SHIELD | Admitting: Family

## 2014-12-19 VITALS — BP 122/86 | HR 103 | Temp 98.0°F | Resp 16 | Ht 65.0 in | Wt 294.2 lb

## 2014-12-19 DIAGNOSIS — N611 Abscess of the breast and nipple: Secondary | ICD-10-CM

## 2014-12-19 DIAGNOSIS — E785 Hyperlipidemia, unspecified: Secondary | ICD-10-CM

## 2014-12-19 DIAGNOSIS — E1165 Type 2 diabetes mellitus with hyperglycemia: Secondary | ICD-10-CM

## 2014-12-19 DIAGNOSIS — E11628 Type 2 diabetes mellitus with other skin complications: Secondary | ICD-10-CM

## 2014-12-19 DIAGNOSIS — K219 Gastro-esophageal reflux disease without esophagitis: Secondary | ICD-10-CM | POA: Diagnosis not present

## 2014-12-19 DIAGNOSIS — N61 Inflammatory disorders of breast: Secondary | ICD-10-CM

## 2014-12-19 DIAGNOSIS — IMO0002 Reserved for concepts with insufficient information to code with codable children: Secondary | ICD-10-CM

## 2014-12-19 MED ORDER — CEPHALEXIN 500 MG PO CAPS: 500.0000 mg | ORAL_CAPSULE | Freq: Four times a day (QID) | ORAL | Status: DC

## 2014-12-19 MED ORDER — OMEPRAZOLE 40 MG PO CPDR: 40.0000 mg | DELAYED_RELEASE_CAPSULE | Freq: Every day | ORAL | Status: DC

## 2014-12-19 MED ORDER — INSULIN GLARGINE 100 UNIT/ML SOLOSTAR PEN: PEN_INJECTOR | SUBCUTANEOUS | Status: DC

## 2014-12-19 NOTE — Progress Notes (Signed)
Pre visit review using our clinic review tool, if applicable. No additional management support is needed unless otherwise documented below in the visit note. 

## 2014-12-19 NOTE — Patient Instructions (Signed)
Start Keflex every 6 hours for mild skin infection left abdominal skin fold. Call if increased pain/swelling or if not resolved in 1 week. Stop metformin. Increase lantus from 23 units to 26 units once daily. Complete lab work prior to leaving.  Please schedule a follow up appointment in 3 months.

## 2014-12-19 NOTE — Progress Notes (Signed)
Subjective:    Patient ID: Catherine Espinoza, female    DOB: Feb 06, 1976, 39 y.o.   MRN: [id number]  HPI  Catherine Espinoza is a 39 yr old female who presents today for follow up.  1) R breast Abscess- She was last seen by our office on 10/31/14 with R breast abscess/cellulitis that had not responded to outpatient antibiotics. She was referred to the ED where she was admitted. She had I and D and wound vac placed.  She was hospitalized for 5 days.  She was followed by the wound care center weekly and was discharged 2 weeks ago from wound care center. Now healed.    2) DM2- 140-150's at night, in the AM's her sugars are 130-140.  Rarely down to 110. She reports that she is not taking metformin due to "feeling crappy" when she takes it.   Lab Results  Component Value Date   HGBA1C 10.4* 08/11/2014   Wt Readings from Last 3 Encounters:  12/19/14 294 lb 3.2 oz (133.448 kg)  10/31/14 293 lb 6 oz (133.074 kg)  10/24/14 296 lb 3.2 oz (134.355 kg)    3) GERD- stable on omeprazole, has not tolerated going off of omeprazole and reports h2b did not help enough.    Review of Systems    see HPI  Past Medical History  Diagnosis Date  . Unspecified essential hypertension   . Proteinuria   . Excessive or frequent menstruation   . Anemia, unspecified   . Type II or unspecified type diabetes mellitus without mention of complication, not stated as uncontrolled   . Morbid obesity   . Obstructive sleep apnea 02/11/11    Sleep study: severe OSA- rec CPAP 20cm small  full face mask    History   Social History  . Marital Status: Single    Spouse Name: N/A  . Number of Children: N/A  . Years of Education: N/A   Occupational History  . cna/med tech    Social History Main Topics  . Smoking status: Current Every Day Smoker -- 0.50 packs/day for 10 years    Types: Cigarettes  . Smokeless tobacco: Never Used  . Alcohol Use: Yes     Comment: occasional use  . Drug Use: No  . Sexual Activity: Yes   Birth Control/ Protection: None   Other Topics Concern  . Not on file   Social History Narrative   Married-filing for divorced   24 year old daughter   Works as Industrial/product designer    Past Surgical History  Procedure Laterality Date  . Cesarean section    . Ectopic pregnancy surgery      x 2  . Cyst removal    . Cleft palate repair    . Oophorectomy Right 2002  . Ablation colpoclesis    . Abdominal hysterectomy      Family History  Problem Relation Age of Onset  . Cancer Father     oral cancer  . Heart attack Maternal Aunt     Allergies  Allergen Reactions  . Ibuprofen     REACTION: knots in mouth with SOB  . Labetalol Nausea Only  . Aspirin     Knots in mouth     Current Outpatient Prescriptions on File Prior to Visit  Medication Sig Dispense Refill  . atorvastatin (LIPITOR) 20 MG tablet Take 1 tablet (20 mg total) by mouth daily. 30 tablet 1  . glucose blood (ACCU-CHEK AVIVA) test strip Use to check blood sugar two  times daily. 100 each 2  . insulin aspart (NOVOLOG FLEXPEN) 100 UNIT/ML SOPN FlexPen Inject 5 Units into the skin daily before lunch.    . Insulin Pen Needle (BD ULTRA-FINE PEN NEEDLES) 29G X 12.7MM MISC Use with lantus solostar for insulin injections. 100 each 1  . INSULIN SYRINGE 1CC/29G (SUNMARK INSULIN SYR 1CC/29G) 29G X 1/2" 1 ML MISC Use for insulin injection once a day. 100 each 2  . Lancets (ACCU-CHEK MULTICLIX) lancets Use as instructed to check blood sugar twice a day.     . lisinopril (PRINIVIL,ZESTRIL) 10 MG tablet Take 1 tablet (10 mg total) by mouth daily. 30 tablet 1  . metFORMIN (GLUCOPHAGE-XR) 500 MG 24 hr tablet Take 4 tablets by mouth once a day. 120 tablet 3  . terbinafine (LAMISIL) 250 MG tablet Take 1 tablet (250 mg total) by mouth daily. 30 tablet 1  . traMADol (ULTRAM) 50 MG tablet Take 1 tablet (50 mg total) by mouth every 8 (eight) hours as needed. 15 tablet 0  . sitaGLIPtin (JANUVIA) 100 MG tablet Take 1 tablet (100 mg total) by  mouth daily. 30 tablet 3   No current facility-administered medications on file prior to visit.    BP 122/86 mmHg  Pulse 103  Temp(Src) 98 F (36.7 C) (Oral)  Resp 16  Ht 5\' 5"  (1.651 m)  Wt 294 lb 3.2 oz (133.448 kg)  BMI 48.96 kg/m2  SpO2 99%  LMP 09/13/2011    Objective:   Physical Exam  Constitutional: She appears well-developed and well-nourished.  Cardiovascular: Normal rate, regular rhythm and normal heart sounds.   No murmur heard. Pulmonary/Chest: Effort normal and breath sounds normal. No respiratory distress. She has no wheezes.  Skin:  Abscess right breast is well healed. Scar remains.   Psychiatric: She has a normal mood and affect. Her behavior is normal. Judgment and thought content normal.          Assessment & Plan:

## 2014-12-20 ENCOUNTER — Telehealth: Payer: Self-pay | Admitting: Family

## 2014-12-20 ENCOUNTER — Encounter: Payer: Self-pay | Admitting: Family

## 2014-12-20 DIAGNOSIS — E785 Hyperlipidemia, unspecified: Secondary | ICD-10-CM

## 2014-12-20 LAB — BASIC METABOLIC PANEL
BUN: 17 mg/dL (ref 6–23)
CHLORIDE: 103 meq/L (ref 96–112)
CO2: 25 mEq/L (ref 19–32)
Calcium: 8.9 mg/dL (ref 8.4–10.5)
Creatinine, Ser: 0.84 mg/dL (ref 0.40–1.20)
GFR: 97.12 mL/min (ref >60.00)
Glucose, Bld: 118 mg/dL — ABNORMAL HIGH (ref 70–99)
POTASSIUM: 3.9 meq/L (ref 3.5–5.1)
SODIUM: 133 meq/L — AB (ref 135–145)

## 2014-12-20 LAB — LIPID PANEL
Cholesterol: 226 mg/dL — ABNORMAL HIGH (ref 0–200)
HDL: 31.5 mg/dL — ABNORMAL LOW (ref >39.00)
LDL Cholesterol: 159 mg/dL — ABNORMAL HIGH (ref 0–99)
NonHDL: 194.5
Total CHOL/HDL Ratio: 7
Triglycerides: 177 mg/dL — ABNORMAL HIGH (ref 0.0–149.0)
VLDL: 35.4 mg/dL (ref 0.0–40.0)

## 2014-12-20 LAB — HEMOGLOBIN A1C: HEMOGLOBIN A1C: 10.8 % — AB (ref 4.6–6.5)

## 2014-12-20 NOTE — Telephone Encounter (Signed)
Notified pt. Pt had not been taking atorvastatin as she states she was unable to afford it. Pt states she just picked up a refill and will restart 20mg  daily. Scheduled lab appt for 02/01/15 at 8:15am. Pt requests that I faxed below recommendations to her at work in the morning at [phone]. Future lab order entered.

## 2014-12-20 NOTE — Telephone Encounter (Signed)
Below recommendations not faxed per pt's mychart request. See mychart message.

## 2014-12-20 NOTE — Telephone Encounter (Signed)
Cholesterol remains uncontrolled. Has she been taking atorvastatin 20?  If so, we need to increase to 40mg  and repeat flp in 6 weeks.  Otherwise restart atorvastatin 20mg . Sugar is not well controlled. Rec that she increase lantus from 23 to 26 units as we discussed.  I would recommend that she use a sliding scale for novolog before each meal as follows: sliding scale- check sugar and inject 3 times daily before meals as below:  <150-   Zero units 150-200 2 units 201-250 4 units 251-300 6 units 301-350 8 units 351-400 10 units >400             12 units and contact us.  Keep log of sugars.  Please contact us with her readings in 1 week.

## 2014-12-21 MED ORDER — INSULIN ASPART 100 UNIT/ML FLEXPEN: PEN_INJECTOR | SUBCUTANEOUS | Status: DC

## 2014-12-21 NOTE — Addendum Note (Signed)
Addended by: Kelle Darting A on: 12/21/2014 10:44 AM   Modules accepted: Orders

## 2014-12-23 ENCOUNTER — Telehealth: Payer: Self-pay | Admitting: Family

## 2014-12-23 MED ORDER — ATORVASTATIN CALCIUM 20 MG PO TABS: 20.0000 mg | ORAL_TABLET | Freq: Every day | ORAL | Status: DC

## 2014-12-23 NOTE — Telephone Encounter (Signed)
See mychart.  

## 2014-12-25 DIAGNOSIS — K219 Gastro-esophageal reflux disease without esophagitis: Secondary | ICD-10-CM | POA: Insufficient documentation

## 2014-12-25 NOTE — Assessment & Plan Note (Signed)
Resolved

## 2014-12-25 NOTE — Assessment & Plan Note (Signed)
Lab Results  Component Value Date   HGBA1C 10.8* 12/19/2014   Still uncontrolled.  Increase lantus from 23 to 26 units once daily and add novolog sliding scale TID AC meals. D/C metformin due to intolerance.

## 2014-12-25 NOTE — Assessment & Plan Note (Signed)
Stable on omeprazole, contineu same.

## 2014-12-27 ENCOUNTER — Encounter: Payer: Self-pay | Admitting: Family

## 2014-12-30 NOTE — Telephone Encounter (Signed)
Catherine Espinoza-- Please see fax from pt with blood sugar readings in your signature folder. I have faxed her a tip sheet from [employer] on eating out.  Pt is requesting referral to nutritionist.

## 2015-01-03 ENCOUNTER — Telehealth: Payer: Self-pay | Admitting: Family

## 2015-01-03 DIAGNOSIS — IMO0002 Reserved for concepts with insufficient information to code with codable children: Secondary | ICD-10-CM

## 2015-01-03 DIAGNOSIS — E1165 Type 2 diabetes mellitus with hyperglycemia: Secondary | ICD-10-CM

## 2015-01-03 NOTE — Telephone Encounter (Signed)
See mychart.  

## 2015-01-10 LAB — HM DIABETES EYE EXAM

## 2015-01-19 ENCOUNTER — Ambulatory Visit: Payer: BLUE CROSS/BLUE SHIELD

## 2015-01-24 ENCOUNTER — Encounter: Payer: Self-pay | Admitting: Dietician

## 2015-01-24 ENCOUNTER — Encounter: Payer: BLUE CROSS/BLUE SHIELD | Attending: Family | Admitting: Dietician

## 2015-01-24 VITALS — Ht 65.0 in | Wt 290.0 lb

## 2015-01-24 DIAGNOSIS — G4730 Sleep apnea, unspecified: Secondary | ICD-10-CM | POA: Insufficient documentation

## 2015-01-24 DIAGNOSIS — IMO0002 Reserved for concepts with insufficient information to code with codable children: Secondary | ICD-10-CM

## 2015-01-24 DIAGNOSIS — E1165 Type 2 diabetes mellitus with hyperglycemia: Secondary | ICD-10-CM | POA: Diagnosis not present

## 2015-01-24 DIAGNOSIS — Z6841 Body Mass Index (BMI) 40.0 and over, adult: Secondary | ICD-10-CM | POA: Insufficient documentation

## 2015-01-24 DIAGNOSIS — Z713 Dietary counseling and surveillance: Secondary | ICD-10-CM | POA: Diagnosis not present

## 2015-01-24 DIAGNOSIS — Z794 Long term (current) use of insulin: Secondary | ICD-10-CM | POA: Insufficient documentation

## 2015-01-24 NOTE — Patient Instructions (Signed)
Keep up the good work! Continue with the daily exercise: 30-45 minutes at the gym and meeting your step goals are wonderful for your health and diabetes control. Great job with the changes to your beverages! Have protein with all meals and snacks. Eat breakfast consistently. Aim for 1/2 your plate to be non-starchy vegetables. Aim for 2-3 carb choices per meal  Aim for 0-1 carb choice per snack if hungry

## 2015-01-24 NOTE — Progress Notes (Signed)
Diabetes Self-Management Education  Visit Type:  Initial  Appt. Start Time: 0815 Appt. End Time: 0945  01/24/2015  Catherine Espinoza, identified by name and date of birth, is a 39 y.o. female with a diagnosis of Diabetes: Type 2.  Other people present during visit:  Patient   Patient is here alone.  She works as a Web designer at [employer].  She has sliding scale insulin but has not had to take often since stopping regular soda.  Wears a pedometer and aims for 10,000 steps.  Hx also includes sleep apnea and wears bi-pap 5 x per week.  States that symptoms have improved and she needs to get this re evaluated.  Has started to go to the gym daily, walks for 30 minutes adn then does toning exercises.  Lipids noted with HDL of 31.5 and LDL of 159 08/11/14.    ASSESSMENT  Height 5\' 5"  (1.651 m), weight 290 lb (131.543 kg), last menstrual period 09/13/2011. Body mass index is 48.26 kg/(m^2).   TANITA  BODY COMP RESULTS 01/24/15 288.5   BMI (kg/m^2) 48   Fat Mass (lbs) 162   Fat Free Mass (lbs) 156.5   Total Body Water (lbs) 92.5     Initial Visit Information:  Are you currently following a meal plan?: No   Are you taking your medications as prescribed?: Yes Are you checking your feet?: Yes How many days per week are you checking your feet?: 7 How often do you need to have someone help you when you read instructions, pamphlets, or other written materials from your doctor or pharmacy?: 1 - Never What is the last grade level you completed in school?: Associates degree in education  Psychosocial:     Patient Belief/Attitude about Diabetes: Motivated to manage diabetes Self-care barriers: None Self-management support: Doctor's office, Family Other persons present: Patient Patient Concerns: Nutrition/Meal planning, Healthy Lifestyle, Weight Control Special Needs: None Preferred Learning Style: No preference indicated Learning Readiness: Ready  Complications:   Last HgB A1C per  patient/outside source: 10.8 mg/dL (12/19/14 10.4-12 for the past 3 years) How often do you check your blood sugar?: 3-4 times/day Fasting Blood glucose range (mg/dL): 70-129 Postprandial Blood glucose range (mg/dL): 130-179 Number of hypoglycemic episodes per month: 0 Number of hyperglycemic episodes per week: 1 What do you do if your blood sugar is high?: nothing Have you had a dilated eye exam in the past 12 months?: Yes Have you had a dental exam in the past 12 months?: Yes  Diet Intake:  Breakfast: nothing or vanilla flavored whey protein shake made with regular yogurt, spinach, strawberries, and water Snack (morning): banana or apple Lunch: whatever they are serving at the nursing home (healthy and a lot of vegetables) Snack (afternoon): none Dinner: baked, boiled, steamed food.  veges, protein, starch Beverage(s): water, diet pepsi, coffee with sweet n low and vanilla creamer  Exercise:  Exercise: Light (walking / raking leaves) (goes to the gym daily:  30 minutes treatmill plus tonning exerciese) Light Exercise amount of time (min / week): 150 (>210 minutes per week)  Individualized Plan for Diabetes Self-Management Training:   Learning Objective:  Patient will have a greater understanding of diabetes self-management.  Patient education plan per assessed needs and concerns is to attend individual sessions for     Education Topics Reviewed with Patient Today:  Definition of diabetes, type 1 and 2, and the diagnosis of diabetes Role of diet in the treatment of diabetes and the relationship between the  three main macronutrients and blood glucose level, Food label reading, portion sizes and measuring food., Carbohydrate counting Role of exercise on diabetes management, blood pressure control and cardiac health.       Relationship between chronic complications and blood glucose control, Assessed and discussed foot care and prevention of foot problems, Dental care, Retinopathy  and reason for yearly dilated eye exams, Lipid levels, blood glucose control and heart disease     Lifestyle issues that need to be addressed for better diabetes care  PATIENTS GOALS/Plan (Developed by the patient):  Nutrition: Follow meal plan discussed, General guidelines for healthy choices and portions discussed Physical Activity: Exercise 5-7 days per week Medications: take my medication as prescribed Monitoring : test blood glucose pre and post meals as discussed  Plan:   Patient Instructions  Keep up the good work! Continue with the daily exercise: 30-45 minutes at the gym and meeting your step goals are wonderful for your health and diabetes control. Great job with the changes to your beverages! Have protein with all meals and snacks. Eat breakfast consistently. Aim for 1/2 your plate to be non-starchy vegetables. Aim for 2-3 carb choices per meal  Aim for 0-1 carb choice per snack if hungry  Expected Outcomes:     Education material provided: Living Well with Diabetes, Food label handouts, A1C conversion sheet, Meal plan card, My Plate and Snack sheet  If problems or questions, patient to contact team via:  Phone and Email  Future DSME appointment: 4-6 wks

## 2015-01-30 ENCOUNTER — Encounter: Payer: Self-pay | Admitting: Family

## 2015-02-01 ENCOUNTER — Other Ambulatory Visit (INDEPENDENT_AMBULATORY_CARE_PROVIDER_SITE_OTHER): Payer: BLUE CROSS/BLUE SHIELD

## 2015-02-01 DIAGNOSIS — E1165 Type 2 diabetes mellitus with hyperglycemia: Secondary | ICD-10-CM

## 2015-02-01 DIAGNOSIS — E785 Hyperlipidemia, unspecified: Secondary | ICD-10-CM | POA: Diagnosis not present

## 2015-02-01 DIAGNOSIS — IMO0002 Reserved for concepts with insufficient information to code with codable children: Secondary | ICD-10-CM

## 2015-02-01 LAB — LIPID PANEL
CHOL/HDL RATIO: 5
Cholesterol: 179 mg/dL (ref 0–200)
HDL: 35.2 mg/dL — ABNORMAL LOW (ref >39.00)
LDL Cholesterol: 127 mg/dL — ABNORMAL HIGH (ref 0–99)
NonHDL: 143.8
Triglycerides: 85 mg/dL (ref 0.0–149.0)
VLDL: 17 mg/dL (ref 0.0–40.0)

## 2015-02-02 ENCOUNTER — Other Ambulatory Visit: Payer: Self-pay | Admitting: Family

## 2015-02-02 DIAGNOSIS — E785 Hyperlipidemia, unspecified: Secondary | ICD-10-CM

## 2015-02-02 NOTE — Telephone Encounter (Signed)
Cholesterol is improved but still above goal, increase lipitor from 20mg  to 40mg .  Follow up in flp in 6 weeks.

## 2015-02-03 MED ORDER — ATORVASTATIN CALCIUM 40 MG PO TABS: 40.0000 mg | ORAL_TABLET | Freq: Every day | ORAL | Status: DC

## 2015-02-03 NOTE — Telephone Encounter (Signed)
Notified pt and she voices understanding. Lab appt scheduled for 03/17/15 at 8am.

## 2015-02-21 ENCOUNTER — Ambulatory Visit: Payer: BLUE CROSS/BLUE SHIELD | Admitting: Dietician

## 2015-03-17 ENCOUNTER — Other Ambulatory Visit: Payer: BLUE CROSS/BLUE SHIELD

## 2015-06-17 ENCOUNTER — Encounter (HOSPITAL_BASED_OUTPATIENT_CLINIC_OR_DEPARTMENT_OTHER): Payer: Self-pay

## 2015-06-17 ENCOUNTER — Emergency Department (HOSPITAL_BASED_OUTPATIENT_CLINIC_OR_DEPARTMENT_OTHER)
Admission: EM | Admit: 2015-06-17 | Discharge: 2015-06-17 | Disposition: A | Discharge status: Home / Self Care | Payer: BLUE CROSS/BLUE SHIELD | Attending: Emergency Medicine | Admitting: Emergency Medicine

## 2015-06-17 DIAGNOSIS — I10 Essential (primary) hypertension: Secondary | ICD-10-CM | POA: Diagnosis not present

## 2015-06-17 DIAGNOSIS — E119 Type 2 diabetes mellitus without complications: Secondary | ICD-10-CM | POA: Diagnosis not present

## 2015-06-17 DIAGNOSIS — G4733 Obstructive sleep apnea (adult) (pediatric): Secondary | ICD-10-CM | POA: Insufficient documentation

## 2015-06-17 DIAGNOSIS — Z862 Personal history of diseases of the blood and blood-forming organs and certain disorders involving the immune mechanism: Secondary | ICD-10-CM | POA: Insufficient documentation

## 2015-06-17 DIAGNOSIS — L0291 Cutaneous abscess, unspecified: Secondary | ICD-10-CM

## 2015-06-17 DIAGNOSIS — Z720 Tobacco use: Secondary | ICD-10-CM | POA: Diagnosis not present

## 2015-06-17 DIAGNOSIS — R000 Tachycardia, unspecified: Secondary | ICD-10-CM | POA: Diagnosis not present

## 2015-06-17 DIAGNOSIS — Z794 Long term (current) use of insulin: Secondary | ICD-10-CM | POA: Diagnosis not present

## 2015-06-17 DIAGNOSIS — Z79899 Other long term (current) drug therapy: Secondary | ICD-10-CM | POA: Diagnosis not present

## 2015-06-17 DIAGNOSIS — Z8742 Personal history of other diseases of the female genital tract: Secondary | ICD-10-CM | POA: Diagnosis not present

## 2015-06-17 DIAGNOSIS — L02415 Cutaneous abscess of right lower limb: Secondary | ICD-10-CM | POA: Insufficient documentation

## 2015-06-17 DIAGNOSIS — Z3202 Encounter for pregnancy test, result negative: Secondary | ICD-10-CM | POA: Diagnosis not present

## 2015-06-17 LAB — CBC WITH DIFFERENTIAL/PLATELET
BASOS ABS: 0.1 10*3/uL (ref 0.0–0.1)
Basophils Relative: 1 %
EOS ABS: 0.2 10*3/uL (ref 0.0–0.7)
EOS PCT: 2 %
HCT: 41.8 % (ref 36.0–46.0)
Hemoglobin: 13.7 g/dL (ref 12.0–15.0)
LYMPHS ABS: 3.4 10*3/uL (ref 0.7–4.0)
LYMPHS PCT: 37 %
MCH: 29 pg (ref 26.0–34.0)
MCHC: 32.8 g/dL (ref 30.0–36.0)
MCV: 88.4 fL (ref 78.0–100.0)
MONO ABS: 0.7 10*3/uL (ref 0.1–1.0)
Monocytes Relative: 7 %
Neutro Abs: 4.8 10*3/uL (ref 1.7–7.7)
Neutrophils Relative %: 53 %
PLATELETS: 273 10*3/uL (ref 150–400)
RBC: 4.73 MIL/uL (ref 3.87–5.11)
RDW: 12.9 % (ref 11.5–15.5)
WBC: 9.1 10*3/uL (ref 4.0–10.5)

## 2015-06-17 LAB — URINALYSIS, ROUTINE W REFLEX MICROSCOPIC
HGB URINE DIPSTICK: NEGATIVE
KETONES UR: NEGATIVE mg/dL
Leukocytes, UA: NEGATIVE
Nitrite: NEGATIVE
PH: 5.5 (ref 5.0–8.0)
Protein, ur: 30 mg/dL — AB
Specific Gravity, Urine: 1.042 — ABNORMAL HIGH (ref 1.005–1.030)
Urobilinogen, UA: 1 mg/dL (ref 0.0–1.0)

## 2015-06-17 LAB — BASIC METABOLIC PANEL
Anion gap: 7 (ref 5–15)
BUN: 7 mg/dL (ref 6–20)
CALCIUM: 8.9 mg/dL (ref 8.9–10.3)
CO2: 28 mmol/L (ref 22–32)
Chloride: 99 mmol/L — ABNORMAL LOW (ref 101–111)
Creatinine, Ser: 0.59 mg/dL (ref 0.44–1.00)
GFR calc Af Amer: >60 mL/min (ref >60)
GLUCOSE: 243 mg/dL — AB (ref 65–99)
Potassium: 3.3 mmol/L — ABNORMAL LOW (ref 3.5–5.1)
SODIUM: 134 mmol/L — AB (ref 135–145)

## 2015-06-17 LAB — PREGNANCY, URINE: Preg Test, Ur: NEGATIVE

## 2015-06-17 LAB — URINE MICROSCOPIC-ADD ON

## 2015-06-17 MED ORDER — HYDROCODONE-ACETAMINOPHEN 5-325 MG PO TABS: ORAL_TABLET | ORAL | Status: DC

## 2015-06-17 MED ORDER — MORPHINE SULFATE (PF) 4 MG/ML IV SOLN
4.0000 mg | INTRAVENOUS | Status: DC | PRN
  Administered 2015-06-17: 4 mg via INTRAVENOUS
  Filled 2015-06-17: qty 1

## 2015-06-17 MED ORDER — LIDOCAINE-EPINEPHRINE-TETRACAINE (LET) SOLUTION
3.0000 mL | Freq: Once | NASAL | Status: AC
  Administered 2015-06-17: 3 mL via TOPICAL
  Filled 2015-06-17: qty 3

## 2015-06-17 MED ORDER — SULFAMETHOXAZOLE-TRIMETHOPRIM 800-160 MG PO TABS
1.0000 | ORAL_TABLET | Freq: Once | ORAL | Status: AC
  Administered 2015-06-17: 1 via ORAL
  Filled 2015-06-17: qty 1

## 2015-06-17 MED ORDER — ONDANSETRON HCL 4 MG/2ML IJ SOLN
4.0000 mg | Freq: Once | INTRAMUSCULAR | Status: AC
  Administered 2015-06-17: 4 mg via INTRAVENOUS
  Filled 2015-06-17: qty 2

## 2015-06-17 MED ORDER — SODIUM CHLORIDE 0.9 % IV BOLUS (SEPSIS)
1000.0000 mL | Freq: Once | INTRAVENOUS | Status: AC
  Administered 2015-06-17: 1000 mL via INTRAVENOUS

## 2015-06-17 MED ORDER — SULFAMETHOXAZOLE-TRIMETHOPRIM 800-160 MG PO TABS: 1.0000 | ORAL_TABLET | Freq: Two times a day (BID) | ORAL | Status: DC

## 2015-06-17 MED ORDER — LIDOCAINE-EPINEPHRINE (PF) 2 %-1:200000 IJ SOLN
20.0000 mL | Freq: Once | INTRAMUSCULAR | Status: AC
  Administered 2015-06-17: 20 mL via INTRADERMAL
  Filled 2015-06-17: qty 20

## 2015-06-17 NOTE — ED Notes (Signed)
Pt with right thigh abscess that is warm to touch, red, swollen, with minimal drainage. Present x1 week.

## 2015-06-17 NOTE — ED Provider Notes (Signed)
CSN: [id number]     Arrival date & time 06/17/15  1726 History   First MD Initiated Contact with Patient 06/17/15 1747     Chief Complaint  Patient presents with  . Abscess     (Consider location/radiation/quality/duration/timing/severity/associated sxs/prior Treatment) HPI   Blood pressure 144/91, pulse 116, temperature 98.1 F (36.7 C), temperature source Oral, resp. rate 16, height 5\' 5"  (1.651 m), weight 295 lb (133.811 kg), last menstrual period 09/13/2011, SpO2 96 %.  Ketina Mars is a 39 y.o. female with past medical history significant for insulin-dependent diabetes, morbid obesity and frequent abscesses complaining of abscess to right inner thigh worsening over the course of one week. Patient denies fever, chills, nausea, vomiting, active drainage. States that her blood sugars have been erratic at home. He recently had hip surgery on breast abscess which was performed at [facility].  Past Medical History  Diagnosis Date  . Unspecified essential hypertension   . Proteinuria   . Excessive or frequent menstruation   . Anemia, unspecified   . Type II or unspecified type diabetes mellitus without mention of complication, not stated as uncontrolled   . Morbid obesity   . Obstructive sleep apnea 02/11/11    Sleep study: severe OSA- rec CPAP 20cm small  full face mask   Past Surgical History  Procedure Laterality Date  . Cesarean section    . Ectopic pregnancy surgery      x 2  . Cyst removal    . Cleft palate repair    . Oophorectomy Right 2002  . Ablation colpoclesis    . Abdominal hysterectomy     Family History  Problem Relation Age of Onset  . Cancer Father     oral cancer  . Heart attack Maternal Aunt    Social History  Substance Use Topics  . Smoking status: Current Every Day Smoker -- 0.50 packs/day for 10 years    Types: Cigarettes  . Smokeless tobacco: Never Used  . Alcohol Use: 0.0 oz/week    0 Standard drinks or equivalent per week      Comment: occasional use   OB History    No data available     Review of Systems  10 systems reviewed and found to be negative, except as noted in the HPI.   Allergies  Ibuprofen; Labetalol; and Aspirin  Home Medications   Prior to Admission medications   Medication Sig Start Date End Date Taking? Authorizing Provider  atorvastatin (LIPITOR) 40 MG tablet Take 1 tablet (40 mg total) by mouth daily. 02/03/15  Yes [name], NP  glucose blood (ACCU-CHEK AVIVA) test strip Use to check blood sugar two times daily. 01/14/11  Yes [name], NP  Insulin Glargine (LANTUS SOLOSTAR) 100 UNIT/ML Solostar Pen INJECT 26 UNITS SUBCUTANEOUSLY AT BEDTIME 12/19/14  Yes [name], NP  Insulin Pen Needle (BD ULTRA-FINE PEN NEEDLES) 29G X 12.7MM MISC Use with lantus solostar for insulin injections. 08/11/14  Yes [name], NP  INSULIN SYRINGE 1CC/29G ([place] 1CC/29G) 29G X 1/2" 1 ML MISC Use for insulin injection once a day. 08/14/12  Yes [name], NP  Lancets (ACCU-CHEK MULTICLIX) lancets Use as instructed to check blood sugar twice a day.    Yes Historical Provider, MD  omeprazole (PRILOSEC) 40 MG capsule Take 1 capsule (40 mg total) by mouth daily. 12/19/14  Yes [name], NP  HYDROcodone-acetaminophen (NORCO/VICODIN) 5-325 MG per tablet Take 1-2 tablets by mouth every 6 hours as needed for pain  and/or cough. 06/17/15   Nakima Fluegge, PA-C  insulin aspart (NOVOLOG FLEXPEN) 100 UNIT/ML FlexPen Inject per sliding scale TID AC meals sliding scale- check sugar and inject 3 times daily before meals as below:  <150-   Zero units 150-200 2 units 201-250 4 units 251-300 6 units 301-350 8 units 351-400 10 units >400             12 units and contact us. 12/21/14   [name], NP  lisinopril (PRINIVIL,ZESTRIL) 10 MG tablet Take 1 tablet (10 mg total) by mouth daily. 10/11/13   [name], NP  sitaGLIPtin (JANUVIA) 100 MG tablet Take  1 tablet (100 mg total) by mouth daily. 08/12/12 08/12/13  [name], NP  sulfamethoxazole-trimethoprim (BACTRIM DS) 800-160 MG per tablet Take 1 tablet by mouth 2 (two) times daily. 06/17/15   Hildred Mollica, PA-C   BP 138/77 mmHg  Pulse 81  Temp(Src) 98.1 F (36.7 C) (Oral)  Resp 18  Ht 5\' 5"  (1.651 m)  Wt 295 lb (133.811 kg)  BMI 49.09 kg/m2  SpO2 99%  LMP 09/13/2011 Physical Exam  Constitutional: She is oriented to person, place, and time. She appears well-developed and well-nourished. No distress.  Obese  HENT:  Head: Normocephalic.  Mouth/Throat: Oropharynx is clear and moist.  Eyes: Conjunctivae and EOM are normal.  Cardiovascular: Normal rate.   Mild tachycardia  Pulmonary/Chest: Effort normal. No stridor.  Musculoskeletal: Normal range of motion.  Neurological: She is alert and oriented to person, place, and time.  Skin:  4 cm fluctuant abscess to right inner thigh with no surrounding cellulitis or active drainage  Psychiatric: She has a normal mood and affect.  Nursing note and vitals reviewed.   ED Course  INCISION AND DRAINAGE Date/Time: 06/18/2015 1:34 AM Performed by: Monico Blitz Authorized by: Monico Blitz Consent: Verbal consent obtained. Risks and benefits: risks, benefits and alternatives were discussed Consent given by: patient Required items: required blood products, implants, devices, and special equipment available Patient identity confirmed: verbally with patient Type: abscess Body area: lower extremity Location details: right leg Anesthesia: local infiltration Local anesthetic: lidocaine 2% with epinephrine Anesthetic total: 3 ml Patient sedated: no Scalpel size: 11 Incision type: elliptical Incision depth: dermal Complexity: simple Drainage: purulent Drainage amount: moderate Wound treatment: wound left open Packing material: none Patient tolerance: Patient tolerated the procedure well with no immediate  complications   (including critical care time) Labs Review Labs Reviewed  BASIC METABOLIC PANEL - Abnormal; Notable for the following:    Sodium 134 (*)    Potassium 3.3 (*)    Chloride 99 (*)    Glucose, Bld 243 (*)    All other components within normal limits  URINALYSIS, ROUTINE W REFLEX MICROSCOPIC (NOT AT [facility]) - Abnormal; Notable for the following:    Color, Urine AMBER (*)    Specific Gravity, Urine 1.042 (*)    Glucose, UA >1000 (*)    Bilirubin Urine SMALL (*)    Protein, ur 30 (*)    All other components within normal limits  URINE MICROSCOPIC-ADD ON - Abnormal; Notable for the following:    Bacteria, UA FEW (*)    All other components within normal limits  CBC WITH DIFFERENTIAL/PLATELET  PREGNANCY, URINE    Imaging Review No results found. I have personally reviewed and evaluated these images and lab results as part of my medical decision-making.   EKG Interpretation None      MDM   Final diagnoses:  Abscess  Filed Vitals:   06/17/15 1736 06/17/15 1749 06/17/15 1922  BP: 173/110 144/91 138/77  Pulse: 116  81  Temp: 98.1 F (36.7 C)    TempSrc: Oral    Resp: 16  18  Height: 5\' 5"  (1.651 m)    Weight: 295 lb (133.811 kg)    SpO2: 96%  99%    Medications  sodium chloride 0.9 % bolus 1,000 mL (0 mLs Intravenous Stopped 06/17/15 1921)  ondansetron (ZOFRAN) injection 4 mg (4 mg Intravenous Given 06/17/15 1813)  lidocaine-EPINEPHrine-tetracaine (LET) solution (3 mLs Topical Given 06/17/15 1827)  lidocaine-EPINEPHrine (XYLOCAINE W/EPI) 2 %-1:200000 (PF) injection 20 mL (20 mLs Intradermal Given by Other 06/17/15 1816)  sulfamethoxazole-trimethoprim (BACTRIM DS,SEPTRA DS) 800-160 MG per tablet 1 tablet (1 tablet Oral Given 06/17/15 1922)    Rip Harbour is a pleasant 39 y.o. female presenting with abscess right thigh, patient is insulin-dependent diabetic and she presents tachycardic. Will check basic blood work, UA patient will be given fluid bolus and  UA to check for ketones.  Patient is hyperglycemic but no ketones in the urine. I and D is performed and patient will be started on Bactrim.  Evaluation does not show pathology that would require ongoing emergent intervention or inpatient treatment. Pt is hemodynamically stable and mentating appropriately. Discussed findings and plan with patient/guardian, who agrees with care plan. All questions answered. Return precautions discussed and outpatient follow up given.   Discharge Medication List as of 06/17/2015  7:08 PM    START taking these medications   Details  HYDROcodone-acetaminophen (NORCO/VICODIN) 5-325 MG per tablet Take 1-2 tablets by mouth every 6 hours as needed for pain and/or cough., Print    sulfamethoxazole-trimethoprim (BACTRIM DS) 800-160 MG per tablet Take 1 tablet by mouth 2 (two) times daily., Starting 06/17/2015, Until Discontinued, Print             Monico Blitz, PA-C 06/18/15 [place], MD 06/18/15 [phone]

## 2015-06-17 NOTE — ED Notes (Signed)
Dressing applied to wound.  

## 2015-06-17 NOTE — Discharge Instructions (Signed)
°  Take your antibiotics as directed and to completion. You should never have any leftover antibiotics! Push fluids and stay well hydrated.   Any antibiotic use can reduce the efficacy of hormonal birth control. Please use back up method of contraception.   Please follow with your primary care doctor in the next 2 days for a check-up. They must obtain records for further management.   Do not hesitate to return to the Emergency Department for any new, worsening or concerning symptoms.    Abscess Care After An abscess (also called a boil or furuncle) is an infected area that contains a collection of pus. Signs and symptoms of an abscess include pain, tenderness, redness, or hardness, or you may feel a moveable soft area under your skin. An abscess can occur anywhere in the body. The infection may spread to surrounding tissues causing cellulitis. A cut (incision) by the surgeon was made over your abscess and the pus was drained out. Gauze may have been packed into the space to provide a drain that will allow the cavity to heal from the inside outwards. The boil may be painful for 5 to 7 days. Most people with a boil do not have high fevers. Your abscess, if seen early, may not have localized, and may not have been lanced. If not, another appointment may be required for this if it does not get better on its own or with medications. HOME CARE INSTRUCTIONS   Only take over-the-counter or prescription medicines for pain, discomfort, or fever as directed by your caregiver.  When you bathe, soak and then remove gauze or iodoform packs at least daily or as directed by your caregiver. You may then wash the wound gently with mild soapy water. Repack with gauze or do as your caregiver directs. SEEK IMMEDIATE MEDICAL CARE IF:   You develop increased pain, swelling, redness, drainage, or bleeding in the wound site.  You develop signs of generalized infection including muscle aches, chills, fever, or a general  ill feeling.  An oral temperature above 102 F (38.9 C) develops, not controlled by medication. See your caregiver for a recheck if you develop any of the symptoms described above. If medications (antibiotics) were prescribed, take them as directed. Document Released: 04/04/2005 Document Revised: 12/09/2011 Document Reviewed: 11/30/2007 [facility] Patient Information 2015 [place], [place]. This information is not intended to replace advice given to you by your health care provider. Make sure you discuss any questions you have with your health care provider.

## 2015-07-10 ENCOUNTER — Emergency Department (HOSPITAL_BASED_OUTPATIENT_CLINIC_OR_DEPARTMENT_OTHER)
Admission: EM | Admit: 2015-07-10 | Discharge: 2015-07-10 | Disposition: A | Discharge status: Home / Self Care | Payer: BLUE CROSS/BLUE SHIELD | Attending: Emergency Medicine | Admitting: Emergency Medicine

## 2015-07-10 ENCOUNTER — Encounter (HOSPITAL_BASED_OUTPATIENT_CLINIC_OR_DEPARTMENT_OTHER): Payer: Self-pay | Admitting: Emergency Medicine

## 2015-07-10 DIAGNOSIS — L02416 Cutaneous abscess of left lower limb: Secondary | ICD-10-CM | POA: Diagnosis not present

## 2015-07-10 DIAGNOSIS — Z79899 Other long term (current) drug therapy: Secondary | ICD-10-CM | POA: Diagnosis not present

## 2015-07-10 DIAGNOSIS — Z8669 Personal history of other diseases of the nervous system and sense organs: Secondary | ICD-10-CM | POA: Diagnosis not present

## 2015-07-10 DIAGNOSIS — E119 Type 2 diabetes mellitus without complications: Secondary | ICD-10-CM | POA: Diagnosis not present

## 2015-07-10 DIAGNOSIS — I10 Essential (primary) hypertension: Secondary | ICD-10-CM | POA: Insufficient documentation

## 2015-07-10 DIAGNOSIS — Z792 Long term (current) use of antibiotics: Secondary | ICD-10-CM | POA: Diagnosis not present

## 2015-07-10 DIAGNOSIS — Z8742 Personal history of other diseases of the female genital tract: Secondary | ICD-10-CM | POA: Diagnosis not present

## 2015-07-10 DIAGNOSIS — Z794 Long term (current) use of insulin: Secondary | ICD-10-CM | POA: Insufficient documentation

## 2015-07-10 DIAGNOSIS — L0291 Cutaneous abscess, unspecified: Secondary | ICD-10-CM

## 2015-07-10 DIAGNOSIS — Z862 Personal history of diseases of the blood and blood-forming organs and certain disorders involving the immune mechanism: Secondary | ICD-10-CM | POA: Insufficient documentation

## 2015-07-10 DIAGNOSIS — Z720 Tobacco use: Secondary | ICD-10-CM | POA: Diagnosis not present

## 2015-07-10 DIAGNOSIS — L089 Local infection of the skin and subcutaneous tissue, unspecified: Secondary | ICD-10-CM | POA: Diagnosis present

## 2015-07-10 DIAGNOSIS — L732 Hidradenitis suppurativa: Secondary | ICD-10-CM

## 2015-07-10 MED ORDER — LIDOCAINE-EPINEPHRINE-TETRACAINE (LET) TOPICAL GEL
3.0000 mL | Freq: Once | TOPICAL | Status: DC
  Filled 2015-07-10: qty 3

## 2015-07-10 MED ORDER — OXYCODONE-ACETAMINOPHEN 5-325 MG PO TABS: 2.0000 | ORAL_TABLET | ORAL | Status: DC | PRN

## 2015-07-10 MED ORDER — LIDOCAINE-EPINEPHRINE (PF) 2 %-1:200000 IJ SOLN
20.0000 mL | Freq: Once | INTRAMUSCULAR | Status: AC
  Administered 2015-07-10: 20 mL
  Filled 2015-07-10: qty 20

## 2015-07-10 MED ORDER — LIDOCAINE-EPINEPHRINE-TETRACAINE (LET) SOLUTION
3.0000 mL | Freq: Once | NASAL | Status: AC
  Administered 2015-07-10: 3 mL via TOPICAL

## 2015-07-10 MED ORDER — CLINDAMYCIN HCL 150 MG PO CAPS: 150.0000 mg | ORAL_CAPSULE | Freq: Three times a day (TID) | ORAL | Status: DC

## 2015-07-10 MED ORDER — CLINDAMYCIN PHOSPHATE 1 % EX GEL: Freq: Two times a day (BID) | CUTANEOUS | Status: DC

## 2015-07-10 MED ORDER — LIDOCAINE-EPINEPHRINE-TETRACAINE (LET) SOLUTION
NASAL | Status: AC
  Administered 2015-07-10: 3 mL via TOPICAL
  Filled 2015-07-10: qty 3

## 2015-07-10 MED ORDER — OXYCODONE-ACETAMINOPHEN 5-325 MG PO TABS
1.0000 | ORAL_TABLET | Freq: Once | ORAL | Status: AC
  Administered 2015-07-10: 1 via ORAL
  Filled 2015-07-10: qty 1

## 2015-07-10 NOTE — ED Notes (Signed)
Triage note typed under Jess Barters EMT name.  Triage done by RN Bonney Roussel all Triage done by RN Domitila Stetler.

## 2015-07-10 NOTE — ED Notes (Signed)
Pt. Reports she has a boil on her L upper thigh. Pt. Reports no drainage and lots of pain.  The boil is large and has noted pink skin around it and is warm to touch.

## 2015-07-10 NOTE — Discharge Instructions (Signed)
Hidradenitis Suppurativa Hidradenitis suppurativa is a long-term (chronic) skin disease that starts with blocked sweat glands or hair follicles. Bacteria may grow in these blocked openings of your skin. Hidradenitis suppurativa is like a severe form of acne that develops in areas of your body where acne would be unusual. It is most likely to affect the areas of your body where skin rubs against skin and becomes moist. This includes your:  Underarms.  Groin.  Genital areas.  Buttocks.  Upper thighs.  Breasts. Hidradenitis suppurativa may start out with small pimples. The pimples can develop into deep sores that break open (rupture) and drain pus. Over time your skin may thicken and become scarred. Hidradenitis suppurativa cannot be passed from person to person.  CAUSES  The exact cause of hidradenitis suppurativa is not known. This condition may be due to:  Female and female hormones. The condition is rare before and after puberty.  An overactive body defense system (immune system). Your immune system may overreact to the blocked hair follicles or sweat glands and cause swelling and pus-filled sores. RISK FACTORS You may have a higher risk of hidradenitis suppurativa if you:  Are a woman.  Are between ages 54 and 11.  Have a family history of hidradenitis suppurativa.  Have a personal history of acne.  Are overweight.  Smoke.  Take the drug lithium. SIGNS AND SYMPTOMS  The first signs of an outbreak are usually painful skin bumps that look like pimples. As the condition progresses:  Skin bumps may get bigger and grow deeper into the skin.  Bumps under the skin may rupture and drain smelly pus.  Skin may become itchy and infected.  Skin may thicken and scar.  Drainage may continue through tunnels under the skin (fistulas).  Walking and moving your arms can become painful. DIAGNOSIS  Your health care provider may diagnose hidradenitis suppurativa based on your medical  history and your signs and symptoms. A physical exam will also be done. You may need to see a health care provider who specializes in skin diseases (dermatologist). You may also have tests done to confirm the diagnosis. These can include:  Swabbing a sample of pus or drainage from your skin so it can be sent to the lab and tested for infection.  Blood tests to check for infection. TREATMENT  The same treatment will not work for everybody with hidradenitis suppurativa. Your treatment will depend on how severe your symptoms are. You may need to try several treatments to find what works best for you. Part of your treatment may include cleaning and bandaging (dressing) your wounds. You may also have to take medicines, such as the following:  Antibiotics.  Acne medicines.  Medicines to block or suppress the immune system.  A diabetes medicine (metformin) is sometimes used to treat this condition.  For women, birth control pills can sometimes help relieve symptoms. You may need surgery if you have a severe case of hidradenitis suppurativa that does not respond to medicine. Surgery may involve:   Using a laser to clear the skin and remove hair follicles.  Opening and draining deep sores.  Removing the areas of skin that are diseased and scarred. HOME CARE INSTRUCTIONS  Learn as much as you can about your disease, and work closely with your health care providers.  Take medicines only as directed by your health care provider.  If you were prescribed an antibiotic medicine, finish it all even if you start to feel better.  If you are  overweight, losing weight may be very helpful. Try to reach and maintain a healthy weight.  Do not use any tobacco products, including cigarettes, chewing tobacco, or electronic cigarettes. If you need help quitting, ask your health care provider.  Do not shave the areas where you get hidradenitis suppurativa.  Do not wear deodorant.  Wear loose-fitting  clothes.  Try not to overheat and get sweaty.  Take a daily bleach bath as directed by your health care provider.  Fill your bathtub halfway with water.  Pour in  cup of unscented household bleach.  Soak for 5-10 minutes.  Cover sore areas with a warm, clean washcloth (compress) for 5-10 minutes. SEEK MEDICAL CARE IF:   You have a flare-up of hidradenitis suppurativa.  You have chills or a fever.  You are having trouble controlling your symptoms at home.   Abscess An abscess (boil or furuncle) is an infected area on or under the skin. This area is filled with yellowish-white fluid (pus) and other material (debris). HOME CARE   Only take medicines as told by your doctor.  If you were given antibiotic medicine, take it as directed. Finish the medicine even if you start to feel better.  If gauze is used, follow your doctor's directions for changing the gauze.  To avoid spreading the infection:  Keep your abscess covered with a bandage.  Wash your hands well.  Do not share personal care items, towels, or whirlpools with others.  Avoid skin contact with others.  Keep your skin and clothes clean around the abscess.  Keep all doctor visits as told. GET HELP RIGHT AWAY IF:   You have more pain, puffiness (swelling), or redness in the wound site.  You have more fluid or blood coming from the wound site.  You have muscle aches, chills, or you feel sick.  You have a fever. MAKE SURE YOU:   Understand these instructions.  Will watch your condition.  Will get help right away if you are not doing well or get worse.   This information is not intended to replace advice given to you by your health care provider. Make sure you discuss any questions you have with your health care provider.   Document Released: 03/04/2008 Document Revised: 03/17/2012 Document Reviewed: 11/30/2011 Elsevier Interactive Patient Education 2016 [employer]  Incision and Drainage Incision  and drainage is a procedure in which a sac-like structure (cystic structure) is opened and drained. The area to be drained usually contains material such as pus, fluid, or blood.  LET YOUR CAREGIVER KNOW ABOUT:   Allergies to medicine.  Medicines taken, including vitamins, herbs, eyedrops, over-the-counter medicines, and creams.  Use of steroids (by mouth or creams).  Previous problems with anesthetics or numbing medicines.  History of bleeding problems or blood clots.  Previous surgery.  Other health problems, including diabetes and kidney problems.  Possibility of pregnancy, if this applies. RISKS AND COMPLICATIONS  Pain.  Bleeding.  Scarring.  Infection. BEFORE THE PROCEDURE  You may need to have an ultrasound or other imaging tests to see how large or deep your cystic structure is. Blood tests may also be used to determine if you have an infection or how severe the infection is. You may need to have a tetanus shot. PROCEDURE  The affected area is cleaned with a cleaning fluid. The cyst area will then be numbed with a medicine (local anesthetic). A small incision will be made in the cystic structure. A syringe or catheter may be  used to drain the contents of the cystic structure, or the contents may be squeezed out. The area will then be flushed with a cleansing solution. After cleansing the area, it is often gently packed with a gauze or another wound dressing. Once it is packed, it will be covered with gauze and tape or some other type of wound dressing. AFTER THE PROCEDURE   Often, you will be allowed to go home right after the procedure.  You may be given antibiotic medicine to prevent or heal an infection.  If the area was packed with gauze or some other wound dressing, you will likely need to come back in 1 to 2 days to get it removed.  The area should heal in about 14 days.   This information is not intended to replace advice given to you by your health care  provider. Make sure you discuss any questions you have with your health care provider.  Keep scheduled appointment with dermatologist. Follow-up with Gen. surgery for additional evaluation of abscesses. Return to the emergency department if you experience recurrent symptoms, fever, chills.

## 2015-07-11 NOTE — ED Provider Notes (Signed)
CSN: [id number]     Arrival date & time 07/10/15  1808 History   First MD Initiated Contact with Patient 07/10/15 1903     Chief Complaint  Patient presents with  . Recurrent Skin Infections     (Consider location/radiation/quality/duration/timing/severity/associated sxs/prior Treatment) HPI   Catherine Espinoza is a 39 y.o F with past medical history significant for insulin-dependent diabetes, morbid obesity and frequent abscesses complaining of abscess to left inner thigh worsening over the course of one week. Patient denies fever, chills, nausea, vomiting, active drainage. Patient states that she takes Lantus for diabetes which is uncontrolled. Patient has an appointment to see a dermatologist in November for her recurrent abscesses.  Past Medical History  Diagnosis Date  . Unspecified essential hypertension   . Proteinuria   . Excessive or frequent menstruation   . Anemia, unspecified   . Type II or unspecified type diabetes mellitus without mention of complication, not stated as uncontrolled   . Morbid obesity ([place])   . Obstructive sleep apnea 02/11/11    Sleep study: severe OSA- rec CPAP 20cm small  full face mask   Past Surgical History  Procedure Laterality Date  . Cesarean section    . Ectopic pregnancy surgery      x 2  . Cyst removal    . Cleft palate repair    . Oophorectomy Right 2002  . Ablation colpoclesis    . Abdominal hysterectomy     Family History  Problem Relation Age of Onset  . Cancer Father     oral cancer  . Heart attack Maternal Aunt    Social History  Substance Use Topics  . Smoking status: Current Every Day Smoker -- 0.50 packs/day for 10 years    Types: Cigarettes  . Smokeless tobacco: Never Used  . Alcohol Use: 0.0 oz/week    0 Standard drinks or equivalent per week     Comment: occasional use   OB History    No data available     Review of Systems  All other systems reviewed and are negative.     Allergies  Ibuprofen; Labetalol;  and Aspirin  Home Medications   Prior to Admission medications   Medication Sig Start Date End Date Taking? Authorizing Provider  amoxicillin (AMOXIL) 500 MG capsule Take 500 mg by mouth 3 (three) times daily.   Yes Historical Provider, MD  atorvastatin (LIPITOR) 40 MG tablet Take 1 tablet (40 mg total) by mouth daily. 02/03/15   [name], NP  clindamycin (CLEOCIN) 150 MG capsule Take 1 capsule (150 mg total) by mouth 3 (three) times daily. 07/10/15   Samantha Tripp Dowless, PA-C  clindamycin (CLINDAGEL) 1 % gel Apply topically 2 (two) times daily. 07/10/15   Samantha Tripp Dowless, PA-C  glucose blood (ACCU-CHEK AVIVA) test strip Use to check blood sugar two times daily. 01/14/11   [name], NP  HYDROcodone-acetaminophen (NORCO/VICODIN) 5-325 MG per tablet Take 1-2 tablets by mouth every 6 hours as needed for pain and/or cough. 06/17/15   Nicole Pisciotta, PA-C  insulin aspart (NOVOLOG FLEXPEN) 100 UNIT/ML FlexPen Inject per sliding scale TID AC meals sliding scale- check sugar and inject 3 times daily before meals as below:  <150-   Zero units 150-200 2 units 201-250 4 units 251-300 6 units 301-350 8 units 351-400 10 units >400             12 units and contact us. 12/21/14   [name], NP  Insulin Glargine (LANTUS SOLOSTAR) 100  UNIT/ML Solostar Pen INJECT 26 UNITS SUBCUTANEOUSLY AT BEDTIME 12/19/14   [name], NP  Insulin Pen Needle (BD ULTRA-FINE PEN NEEDLES) 29G X 12.7MM MISC Use with lantus solostar for insulin injections. 08/11/14   [name], NP  INSULIN SYRINGE 1CC/29G (SUNMARK INSULIN SYR 1CC/29G) 29G X 1/2" 1 ML MISC Use for insulin injection once a day. 08/14/12   [name], NP  Lancets (ACCU-CHEK MULTICLIX) lancets Use as instructed to check blood sugar twice a day.     Historical Provider, MD  lisinopril (PRINIVIL,ZESTRIL) 10 MG tablet Take 1 tablet (10 mg total) by mouth daily. 10/11/13   [name], NP  omeprazole  (PRILOSEC) 40 MG capsule Take 1 capsule (40 mg total) by mouth daily. 12/19/14   [name], NP  oxyCODONE-acetaminophen (PERCOCET/ROXICET) 5-325 MG tablet Take 2 tablets by mouth every 4 (four) hours as needed for severe pain. 07/10/15   Samantha Tripp Dowless, PA-C  sitaGLIPtin (JANUVIA) 100 MG tablet Take 1 tablet (100 mg total) by mouth daily. 08/12/12 08/12/13  [name], NP  sulfamethoxazole-trimethoprim (BACTRIM DS) 800-160 MG per tablet Take 1 tablet by mouth 2 (two) times daily. 06/17/15   Nicole Pisciotta, PA-C   BP 157/93 mmHg  Pulse 95  Temp(Src) 98.2 F (36.8 C) (Oral)  Resp 20  Ht 5\' 5"  (1.651 m)  Wt 295 lb (133.811 kg)  BMI 49.09 kg/m2  SpO2 98%  LMP 09/13/2011 Physical Exam  Constitutional: She is oriented to person, place, and time. She appears well-developed and well-nourished. No distress.  HENT:  Head: Normocephalic and atraumatic.  Eyes: Conjunctivae are normal. Right eye exhibits no discharge. Left eye exhibits no discharge. No scleral icterus.  Cardiovascular: Normal rate.   Pulmonary/Chest: Effort normal.  Abdominal: Soft. She exhibits no distension and no mass. There is no tenderness. There is no rebound and no guarding.  Neurological: She is alert and oriented to person, place, and time. Coordination normal.  Skin: No rash noted. She is not diaphoretic. No erythema. No pallor.  1.5 cm fluctuant mass to the left inner thigh. Surrounding skin warm to touch and erythematous. No drainage noted.  Psychiatric: She has a normal mood and affect. Her behavior is normal.  Nursing note and vitals reviewed.   ED Course  Procedures (including critical care time)  INCISION AND DRAINAGE Performed by: Carlos Levering Consent: Verbal consent obtained. Risks and benefits: risks, benefits and alternatives were discussed Type: abscess  Body area: left  Inner thigh  Anesthesia: local infiltration  Incision was made with a scalpel.  Local  anesthetic: lidocaine 2% with epinephrine  Anesthetic total: 10 ml  Complexity: complex Blunt dissection to break up loculations  Drainage: purulent  Drainage amount: copious    Patient tolerance: Patient tolerated the procedure well with no immediate complications.    Labs Review Labs Reviewed - No data to display  Imaging Review No results found. I have personally reviewed and evaluated these images and lab results as part of my medical decision-making.   EKG Interpretation None      MDM   Final diagnoses:  Hidradenitis suppurativa  Abscess    Patient seen for abscess left inner thigh. Will drain. Patient is scheduled to see dermatology in November. We'll also give general surgery follow-up. Suspect this is hidradenitis suppurativa. Will place on oral clindamycin as well as give topical clindamycin gel for not only area of abscess but for tissue in the inguinal folds and under armpits and other areas that typically get infected with  these recurrent abscesses.  Patient instructed to keep area clean and dry. Discussed treatment plan the patient is agreeable. Return precautions outlined in patient discharge instructions.    [place], PA-C 07/11/15 0128  Harvel Quale, MD 07/11/15 [phone]

## 2015-10-17 MED FILL — OMEPRAZOLE DR 40 MG CAPSULE: 40 | 30 days supply | Qty: 30 | Fill #1

## 2015-10-17 MED FILL — LANTUS SOLOSTAR 100 UNITS/M: 100 | 30 days supply | Qty: 15 | Fill #1

## 2016-02-07 DIAGNOSIS — L02412 Cutaneous abscess of left axilla: Secondary | ICD-10-CM | POA: Diagnosis not present

## 2016-02-07 DIAGNOSIS — Z6841 Body Mass Index (BMI) 40.0 and over, adult: Secondary | ICD-10-CM | POA: Diagnosis not present

## 2016-02-11 ENCOUNTER — Encounter (HOSPITAL_BASED_OUTPATIENT_CLINIC_OR_DEPARTMENT_OTHER): Payer: Self-pay | Admitting: Emergency Medicine

## 2016-02-11 ENCOUNTER — Emergency Department (HOSPITAL_BASED_OUTPATIENT_CLINIC_OR_DEPARTMENT_OTHER)
Admission: EM | Admit: 2016-02-11 | Discharge: 2016-02-11 | Disposition: A | Discharge status: Home / Self Care | Payer: BLUE CROSS/BLUE SHIELD | Source: Home / Self Care

## 2016-02-11 ENCOUNTER — Emergency Department (HOSPITAL_BASED_OUTPATIENT_CLINIC_OR_DEPARTMENT_OTHER)
Admission: EM | Admit: 2016-02-11 | Discharge: 2016-02-12 | Disposition: A | Discharge status: Home / Self Care | Payer: BLUE CROSS/BLUE SHIELD | Attending: Emergency Medicine | Admitting: Emergency Medicine

## 2016-02-11 DIAGNOSIS — I10 Essential (primary) hypertension: Secondary | ICD-10-CM | POA: Insufficient documentation

## 2016-02-11 DIAGNOSIS — E119 Type 2 diabetes mellitus without complications: Secondary | ICD-10-CM | POA: Diagnosis not present

## 2016-02-11 DIAGNOSIS — L02414 Cutaneous abscess of left upper limb: Secondary | ICD-10-CM | POA: Insufficient documentation

## 2016-02-11 DIAGNOSIS — Z79899 Other long term (current) drug therapy: Secondary | ICD-10-CM | POA: Insufficient documentation

## 2016-02-11 DIAGNOSIS — L03112 Cellulitis of left axilla: Secondary | ICD-10-CM | POA: Diagnosis not present

## 2016-02-11 DIAGNOSIS — Z7982 Long term (current) use of aspirin: Secondary | ICD-10-CM

## 2016-02-11 DIAGNOSIS — Z531 Procedure and treatment not carried out because of patient's decision for reasons of belief and group pressure: Secondary | ICD-10-CM | POA: Insufficient documentation

## 2016-02-11 DIAGNOSIS — F17210 Nicotine dependence, cigarettes, uncomplicated: Secondary | ICD-10-CM | POA: Insufficient documentation

## 2016-02-11 DIAGNOSIS — Z7984 Long term (current) use of oral hypoglycemic drugs: Secondary | ICD-10-CM | POA: Insufficient documentation

## 2016-02-11 DIAGNOSIS — L02412 Cutaneous abscess of left axilla: Secondary | ICD-10-CM | POA: Diagnosis not present

## 2016-02-11 DIAGNOSIS — Z794 Long term (current) use of insulin: Secondary | ICD-10-CM | POA: Insufficient documentation

## 2016-02-11 MED ORDER — LIDOCAINE-EPINEPHRINE (PF) 2 %-1:200000 IJ SOLN
20.0000 mL | Freq: Once | INTRAMUSCULAR | Status: AC
  Administered 2016-02-11: 20 mL
  Filled 2016-02-11: qty 20

## 2016-02-11 MED ORDER — OXYCODONE-ACETAMINOPHEN 5-325 MG PO TABS
1.0000 | ORAL_TABLET | Freq: Once | ORAL | Status: AC
  Administered 2016-02-11: 1 via ORAL
  Filled 2016-02-11: qty 1

## 2016-02-11 NOTE — ED Notes (Signed)
Pt called x 3 with no response. Was observed by registration leaving the dept.

## 2016-02-11 NOTE — ED Notes (Signed)
Pt in c/o abscess to L underarm onset about a week ago. Was seen and treated for same x 3 days ago but no improvement.

## 2016-02-11 NOTE — ED Notes (Signed)
Pt in c/o abscess to L underarm onset about a week ago. Was seen and treated for same x 3 days ago but no improvement. Patient reports that it still hurts and she is out of pain pills

## 2016-02-11 NOTE — ED Provider Notes (Signed)
CSN: [id number]     Arrival date & time 02/11/16  2211 History  By signing my name below, I, Stephania Fragmin, attest that this documentation has been prepared under the direction and in the presence of Jola Schmidt, MD. Electronically Signed: Stephania Fragmin, ED Scribe. 02/11/2016. 12:25 AM.    Chief Complaint  Patient presents with  . Wound Check   The history is provided by the patient. No language interpreter was used.    HPI Comments: Para Asti is a 40 y.o. female with a history of type II DM and morbid obesity, who presents to the Emergency Department complaining of a gradual-onset, constant, moderate, gradually worsening localized area of pain, redness, and swelling on her left axilla that began 1 week ago. She reports she was seen at [facility] 4 days ago and had the area I&D'ed; however, she was told that the I&D was "shallow," and only minimal purulent drainage was expressed. She states she was started on Bactrim BID. However, patient reports no improvement since being treated for her symptoms. She notes a history of mutltiple abscesses but states they have occurred in different areas on her body. She denies fever or chills.   Past Medical History  Diagnosis Date  . Unspecified essential hypertension   . Proteinuria   . Excessive or frequent menstruation   . Anemia, unspecified   . Type II or unspecified type diabetes mellitus without mention of complication, not stated as uncontrolled   . Morbid obesity ([place])   . Obstructive sleep apnea 02/11/11    Sleep study: severe OSA- rec CPAP 20cm small  full face mask   Past Surgical History  Procedure Laterality Date  . Cesarean section    . Ectopic pregnancy surgery      x 2  . Cyst removal    . Cleft palate repair    . Oophorectomy Right 2002  . Ablation colpoclesis    . Abdominal hysterectomy     Family History  Problem Relation Age of Onset  . Cancer Father     oral cancer  . Heart attack Maternal Aunt    Social History  Substance  Use Topics  . Smoking status: Current Every Day Smoker -- 0.50 packs/day for 10 years    Types: Cigarettes  . Smokeless tobacco: Never Used  . Alcohol Use: 0.0 oz/week    0 Standard drinks or equivalent per week     Comment: occasional use   OB History    No data available     Review of Systems  Constitutional: Negative for fever and chills.  Skin: Positive for color change (localized area of pain, redness, and swelling at left axilla).  All other systems reviewed and are negative.    Allergies  Ibuprofen; Labetalol; and Aspirin  Home Medications   Prior to Admission medications   Medication Sig Start Date End Date Taking? Authorizing Provider  amoxicillin (AMOXIL) 500 MG capsule Take 500 mg by mouth 3 (three) times daily.    Historical Provider, MD  atorvastatin (LIPITOR) 40 MG tablet Take 1 tablet (40 mg total) by mouth daily. 02/03/15   [name], NP  clindamycin (CLEOCIN) 150 MG capsule Take 1 capsule (150 mg total) by mouth 3 (three) times daily. 07/10/15   Samantha Tripp Dowless, PA-C  clindamycin (CLINDAGEL) 1 % gel Apply topically 2 (two) times daily. 07/10/15   Samantha Tripp Dowless, PA-C  glucose blood (ACCU-CHEK AVIVA) test strip Use to check blood sugar two times daily. 01/14/11   Melissa  Inda [place], NP  HYDROcodone-acetaminophen (NORCO/VICODIN) 5-325 MG per tablet Take 1-2 tablets by mouth every 6 hours as needed for pain and/or cough. 06/17/15   Nicole Pisciotta, PA-C  insulin aspart (NOVOLOG FLEXPEN) 100 UNIT/ML FlexPen Inject per sliding scale TID AC meals sliding scale- check sugar and inject 3 times daily before meals as below:  <150-   Zero units 150-200 2 units 201-250 4 units 251-300 6 units 301-350 8 units 351-400 10 units >400             12 units and contact us. 12/21/14   [name], NP  Insulin Glargine (LANTUS SOLOSTAR) 100 UNIT/ML Solostar Pen INJECT 26 UNITS SUBCUTANEOUSLY AT BEDTIME 12/19/14   [name], NP  Insulin Pen  Needle (BD ULTRA-FINE PEN NEEDLES) 29G X 12.7MM MISC Use with lantus solostar for insulin injections. 08/11/14   [name], NP  INSULIN SYRINGE 1CC/29G (SUNMARK INSULIN SYR 1CC/29G) 29G X 1/2" 1 ML MISC Use for insulin injection once a day. 08/14/12   [name], NP  Lancets (ACCU-CHEK MULTICLIX) lancets Use as instructed to check blood sugar twice a day.     Historical Provider, MD  lisinopril (PRINIVIL,ZESTRIL) 10 MG tablet Take 1 tablet (10 mg total) by mouth daily. 10/11/13   [name], NP  omeprazole (PRILOSEC) 40 MG capsule Take 1 capsule (40 mg total) by mouth daily. 12/19/14   [name], NP  oxyCODONE-acetaminophen (PERCOCET/ROXICET) 5-325 MG tablet Take 2 tablets by mouth every 4 (four) hours as needed for severe pain. 07/10/15   Samantha Tripp Dowless, PA-C  sitaGLIPtin (JANUVIA) 100 MG tablet Take 1 tablet (100 mg total) by mouth daily. 08/12/12 08/12/13  [name], NP  sulfamethoxazole-trimethoprim (BACTRIM DS) 800-160 MG per tablet Take 1 tablet by mouth 2 (two) times daily. 06/17/15   Nicole Pisciotta, PA-C   BP 134/81 mmHg  Pulse 112  Temp(Src) 99 F (37.2 C) (Oral)  Resp 18  Ht 5\' 5"  (1.651 m)  Wt 299 lb (135.626 kg)  BMI 49.76 kg/m2  SpO2 96%  LMP 09/13/2011 Physical Exam  Constitutional: She is oriented to person, place, and time. She appears well-developed and well-nourished.  HENT:  Head: Normocephalic.  Eyes: EOM are normal.  Neck: Normal range of motion.  Pulmonary/Chest: Effort normal.  Abdominal: She exhibits no distension.  Musculoskeletal: Normal range of motion.  Neurological: She is alert and oriented to person, place, and time.  Skin:  Fluctuant mass to left axilla, without drainage. Small surrounding erythema.   Psychiatric: She has a normal mood and affect.  Nursing note and vitals reviewed.   ED Course  Procedures (including critical care time)  DIAGNOSTIC STUDIES: Oxygen Saturation is 96% on RA, normal by  my interpretation.    COORDINATION OF CARE: 11:24 PM - Discussed treatment plan with pt at bedside which includes pain medication (Percocet) and I&D. Pt verbalized understanding and agreed to plan.      ++++++++++++++++++++++++++++++++++++++++++++++++++++++++++++++  INCISION AND DRAINAGE PROCEDURE NOTE: Patient identification was confirmed and verbal consent was obtained. This procedure was performed by Jola Schmidt, MD at 12:10 AM. Site: Left axilla Sterile procedures observed Needle size: 27 Anesthetic used (type and amt): lidocaine 2% w/ epi, 8 mL Blade size: 11 Drainage: large amount of purulent drainage Complexity: Complex Site anesthetized, incision made over site, wound drained and explored loculations, rinsed with copious amounts of normal saline, wound packed with sterile gauze, covered with dry, sterile dressing.  Pt tolerated procedure well without complications.  Instructions for care discussed verbally and  pt provided with additional written instructions for homecare and f/u.    ++++++++++++++++++++++++++++++++++++++++++++++++++++++     MDM   Final diagnoses:  None   Overall well-appearing.  Large left axillary abscess with mild cellulitis.  Drainage of abscess resulting in large drainage of copious purulent discharge.  Patient has 6 days of Bactrim remaining.  This should be adequate now that appropriate incision and drainages been performed.  She feels much better time of discharge.  She understands to return to the ER for new or worsening symptoms.     Jola Schmidt, MD 02/12/16 0120

## 2016-02-12 MED ORDER — HYDROCODONE-ACETAMINOPHEN 5-325 MG PO TABS: 1.0000 | ORAL_TABLET | Freq: Four times a day (QID) | ORAL | Status: DC | PRN

## 2016-02-12 NOTE — ED Notes (Signed)
EDP at bedside  

## 2016-02-12 NOTE — Discharge Instructions (Signed)

## 2016-05-09 DIAGNOSIS — E119 Type 2 diabetes mellitus without complications: Secondary | ICD-10-CM | POA: Diagnosis not present

## 2016-05-09 DIAGNOSIS — I10 Essential (primary) hypertension: Secondary | ICD-10-CM | POA: Diagnosis not present

## 2016-05-09 DIAGNOSIS — R000 Tachycardia, unspecified: Secondary | ICD-10-CM | POA: Insufficient documentation

## 2016-05-09 DIAGNOSIS — N61 Mastitis without abscess: Secondary | ICD-10-CM | POA: Diagnosis not present

## 2016-05-09 DIAGNOSIS — Z794 Long term (current) use of insulin: Secondary | ICD-10-CM | POA: Diagnosis not present

## 2016-05-30 DIAGNOSIS — L732 Hidradenitis suppurativa: Secondary | ICD-10-CM | POA: Diagnosis not present

## 2016-06-21 ENCOUNTER — Encounter: Payer: Self-pay | Admitting: Family

## 2016-06-21 ENCOUNTER — Other Ambulatory Visit: Payer: Self-pay | Admitting: Family

## 2016-06-21 ENCOUNTER — Ambulatory Visit (INDEPENDENT_AMBULATORY_CARE_PROVIDER_SITE_OTHER): Payer: BLUE CROSS/BLUE SHIELD | Admitting: Family

## 2016-06-21 ENCOUNTER — Ambulatory Visit (HOSPITAL_BASED_OUTPATIENT_CLINIC_OR_DEPARTMENT_OTHER)
Admission: RE | Admit: 2016-06-21 | Discharge: 2016-06-21 | Disposition: A | Discharge status: Home / Self Care | Payer: BLUE CROSS/BLUE SHIELD | Source: Ambulatory Visit | Attending: Family | Admitting: Family

## 2016-06-21 VITALS — BP 144/98 | HR 104 | Temp 98.0°F | Resp 16 | Ht 66.0 in | Wt 290.8 lb

## 2016-06-21 DIAGNOSIS — Z1239 Encounter for other screening for malignant neoplasm of breast: Secondary | ICD-10-CM

## 2016-06-21 DIAGNOSIS — Z0000 Encounter for general adult medical examination without abnormal findings: Secondary | ICD-10-CM

## 2016-06-21 DIAGNOSIS — Z1231 Encounter for screening mammogram for malignant neoplasm of breast: Secondary | ICD-10-CM | POA: Insufficient documentation

## 2016-06-21 DIAGNOSIS — E1165 Type 2 diabetes mellitus with hyperglycemia: Secondary | ICD-10-CM

## 2016-06-21 DIAGNOSIS — B373 Candidiasis of vulva and vagina: Secondary | ICD-10-CM

## 2016-06-21 DIAGNOSIS — I10 Essential (primary) hypertension: Secondary | ICD-10-CM | POA: Diagnosis not present

## 2016-06-21 DIAGNOSIS — F17200 Nicotine dependence, unspecified, uncomplicated: Secondary | ICD-10-CM | POA: Diagnosis not present

## 2016-06-21 DIAGNOSIS — IMO0001 Reserved for inherently not codable concepts without codable children: Secondary | ICD-10-CM

## 2016-06-21 DIAGNOSIS — B3731 Acute candidiasis of vulva and vagina: Secondary | ICD-10-CM

## 2016-06-21 DIAGNOSIS — Z23 Encounter for immunization: Secondary | ICD-10-CM

## 2016-06-21 DIAGNOSIS — G4733 Obstructive sleep apnea (adult) (pediatric): Secondary | ICD-10-CM | POA: Diagnosis not present

## 2016-06-21 DIAGNOSIS — L732 Hidradenitis suppurativa: Secondary | ICD-10-CM

## 2016-06-21 DIAGNOSIS — Z794 Long term (current) use of insulin: Secondary | ICD-10-CM

## 2016-06-21 LAB — BASIC METABOLIC PANEL
BUN: 6 mg/dL (ref 6–23)
CALCIUM: 9.2 mg/dL (ref 8.4–10.5)
CO2: 27 mEq/L (ref 19–32)
CREATININE: 0.57 mg/dL (ref 0.40–1.20)
Chloride: 98 mEq/L (ref 96–112)
GFR: 150.77 mL/min (ref >60.00)
GLUCOSE: 310 mg/dL — AB (ref 70–99)
Potassium: 4 mEq/L (ref 3.5–5.1)
Sodium: 132 mEq/L — ABNORMAL LOW (ref 135–145)

## 2016-06-21 LAB — HEPATIC FUNCTION PANEL
ALT: 17 U/L (ref 0–35)
AST: 16 U/L (ref 0–37)
Albumin: 3.5 g/dL (ref 3.5–5.2)
Alkaline Phosphatase: 71 U/L (ref 39–117)
BILIRUBIN DIRECT: 0 mg/dL (ref 0.0–0.3)
BILIRUBIN TOTAL: 0.4 mg/dL (ref 0.2–1.2)
Total Protein: 7.5 g/dL (ref 6.0–8.3)

## 2016-06-21 LAB — CBC WITH DIFFERENTIAL/PLATELET
Basophils Absolute: 0 10*3/uL (ref 0.0–0.1)
Basophils Relative: 0.4 % (ref 0.0–3.0)
EOS ABS: 0.2 10*3/uL (ref 0.0–0.7)
Eosinophils Relative: 2.4 % (ref 0.0–5.0)
HCT: 45.6 % (ref 36.0–46.0)
HEMOGLOBIN: 15.3 g/dL — AB (ref 12.0–15.0)
LYMPHS ABS: 3.3 10*3/uL (ref 0.7–4.0)
Lymphocytes Relative: 39.2 % (ref 12.0–46.0)
MCHC: 33.6 g/dL (ref 30.0–36.0)
MCV: 87.7 fl (ref 78.0–100.0)
MONO ABS: 0.6 10*3/uL (ref 0.1–1.0)
Monocytes Relative: 7.7 % (ref 3.0–12.0)
NEUTROS PCT: 50.3 % (ref 43.0–77.0)
Neutro Abs: 4.2 10*3/uL (ref 1.4–7.7)
Platelets: 245 10*3/uL (ref 150.0–400.0)
RBC: 5.21 Mil/uL — AB (ref 3.87–5.11)
RDW: 14.3 % (ref 11.5–15.5)
WBC: 8.3 10*3/uL (ref 4.0–10.5)

## 2016-06-21 LAB — URINALYSIS, ROUTINE W REFLEX MICROSCOPIC
Bilirubin Urine: NEGATIVE
Ketones, ur: NEGATIVE
Leukocytes, UA: NEGATIVE
NITRITE: NEGATIVE
SPECIFIC GRAVITY, URINE: 1.015 (ref 1.000–1.030)
Total Protein, Urine: NEGATIVE
Urobilinogen, UA: 0.2 (ref 0.0–1.0)
pH: 5.5 (ref 5.0–8.0)

## 2016-06-21 LAB — HEMOGLOBIN A1C: HEMOGLOBIN A1C: 14.2 % — AB (ref 4.6–6.5)

## 2016-06-21 LAB — MICROALBUMIN / CREATININE URINE RATIO
CREATININE, U: 92 mg/dL
MICROALB UR: 6.6 mg/dL — AB (ref 0.0–1.9)
MICROALB/CREAT RATIO: 7.2 mg/g (ref 0.0–30.0)

## 2016-06-21 LAB — LIPID PANEL
CHOL/HDL RATIO: 7
CHOLESTEROL: 248 mg/dL — AB (ref 0–200)
HDL: 37.9 mg/dL — ABNORMAL LOW (ref >39.00)
LDL CALC: 180 mg/dL — AB (ref 0–99)
NonHDL: 210.01
TRIGLYCERIDES: 152 mg/dL — AB (ref 0.0–149.0)
VLDL: 30.4 mg/dL (ref 0.0–40.0)

## 2016-06-21 LAB — TSH: TSH: 1.02 u[IU]/mL (ref 0.35–4.50)

## 2016-06-21 MED ORDER — ATORVASTATIN CALCIUM 40 MG PO TABS: 40.0000 mg | ORAL_TABLET | Freq: Every day | ORAL | 5 refills | Status: DC

## 2016-06-21 MED ORDER — SITAGLIPTIN PHOSPHATE 100 MG PO TABS: 100.0000 mg | ORAL_TABLET | Freq: Every day | ORAL | 3 refills | Status: DC

## 2016-06-21 MED ORDER — FLUCONAZOLE 150 MG PO TABS: ORAL_TABLET | ORAL | 0 refills | Status: DC

## 2016-06-21 MED ORDER — CEPHALEXIN 500 MG PO CAPS: 500.0000 mg | ORAL_CAPSULE | Freq: Three times a day (TID) | ORAL | 0 refills | Status: DC

## 2016-06-21 MED ORDER — LISINOPRIL 10 MG PO TABS: 10.0000 mg | ORAL_TABLET | Freq: Every day | ORAL | 1 refills | Status: DC

## 2016-06-21 MED ORDER — OMEPRAZOLE 40 MG PO CPDR: 40.0000 mg | DELAYED_RELEASE_CAPSULE | Freq: Every day | ORAL | 5 refills | Status: DC

## 2016-06-21 MED ORDER — INSULIN ASPART 100 UNIT/ML FLEXPEN: PEN_INJECTOR | SUBCUTANEOUS | 0 refills | Status: DC

## 2016-06-21 MED FILL — NOVOLOG FLEXPEN SYRINGE: 100 | 25 days supply | Qty: 9 | Fill #0

## 2016-06-21 MED FILL — FLUCONAZOLE 150 MG TABLET: 150 | 4 days supply | Qty: 2 | Fill #0

## 2016-06-21 MED FILL — CEPHALEXIN 500 MG CAPSULE: 500 | 7 days supply | Qty: 21 | Fill #0

## 2016-06-21 MED FILL — LANTUS SOLOSTAR 100 UNITS/M: 100 | 23 days supply | Qty: 6 | Fill #0

## 2016-06-21 MED FILL — JANUVIA 100 MG TABLET: 100 | 30 days supply | Qty: 30 | Fill #0

## 2016-06-21 MED FILL — ATORVASTATIN 40 MG TABLET: 40 | 30 days supply | Qty: 30 | Fill #0

## 2016-06-21 MED FILL — OMEPRAZOLE DR 40 MG CAPSULE: 40 | 30 days supply | Qty: 30 | Fill #0

## 2016-06-21 MED FILL — LISINOPRIL 10 MG TABLET: 10 | 30 days supply | Qty: 30 | Fill #0

## 2016-06-21 NOTE — Assessment & Plan Note (Signed)
Encouraged continued diet, exercise, weight loss.

## 2016-06-21 NOTE — Assessment & Plan Note (Signed)
Uncontrolled, restart lisinopril.

## 2016-06-21 NOTE — Assessment & Plan Note (Signed)
Wants to repeat sleep study. Will arrange. Encouraged CPAP compliance.

## 2016-06-21 NOTE — Addendum Note (Signed)
Addended by: Kelle Darting A on: 06/21/2016 04:12 PM   Modules accepted: Orders

## 2016-06-21 NOTE — Patient Instructions (Addendum)
Restart your medications. Start keflex (antibiotic for infection beneath your right arm) Begin diflucan for yeast infection. Schedule mammogram on the first floor. Continue to work on healthy diet/exercise/weight loss. You will be contacted about your referral for home sleep study. In the meantime, restart CPAP.

## 2016-06-21 NOTE — Progress Notes (Signed)
Subjective:    Patient ID: Catherine Espinoza, female    DOB: Jul 13, 1976, 40 y.o.   MRN: [id number]  HPI  Patient presents today for complete physical.  Immunizations: due for pneumovax booster, flu shot, Tdap Diet: trying to work on diet Exercise:walking Pap Smear: hysterectomy Mammogram: due  Wt Readings from Last 3 Encounters:  06/21/16 290 lb 12.8 oz (131.9 kg)  02/11/16 299 lb (135.6 kg)  02/11/16 299 lb (135.6 kg)   DM2- on ACE. Reports that she is checking her sugars intermittently.  She is drinking more water. She is using lantus 26 units QHS. She never refilled [place].  She uses novolog at lunch (2-4 units usually at lunch), at dinner time she uses 2-4 units generally.   Lab Results  Component Value Date   HGBA1C 10.8 (H) 12/19/2014   HGBA1C 10.4 (H) 08/11/2014   HGBA1C 10.1 (H) 04/20/2013   Lab Results  Component Value Date   MICROALBUR 5.4 (H) 08/11/2014   LDLCALC 127 (H) 02/01/2015   CREATININE 0.59 06/17/2015   HTN- she is out of lisinopril.   BP Readings from Last 3 Encounters:  06/21/16 (!) 144/98  02/12/16 147/97  02/11/16 122/90   OSA-  Reports that she has not been complying wth cpap.    Hyperlipidemia- not taking statin.  Ran out.   Tobacco abuse-  1PPD    GERD-  Reports that she she ran out of omeprazole.  Heartburn worsened.   Vaginal itching- requests rx for yeast.    Review of Systems  Constitutional: Negative for unexpected weight change.  HENT: Negative for hearing loss and rhinorrhea.   Eyes: Negative for visual disturbance.  Respiratory: Negative for cough.   Cardiovascular: Negative for leg swelling.  Gastrointestinal: Negative for blood in stool, constipation and diarrhea.  Genitourinary: Positive for frequency. Negative for dysuria.  Musculoskeletal: Negative for arthralgias and myalgias.  Skin: Negative for rash.  Neurological: Negative for headaches.  Hematological: Negative for adenopathy.  Psychiatric/Behavioral:   Denies depression/anxiety   Past Medical History:  Diagnosis Date  . Anemia, unspecified   . Excessive or frequent menstruation   . Morbid obesity ([place])   . Obstructive sleep apnea 02/11/11   Sleep study: severe OSA- rec CPAP 20cm small  full face mask  . Proteinuria   . Type II or unspecified type diabetes mellitus without mention of complication, not stated as uncontrolled   . Unspecified essential hypertension      Social History   Social History  . Marital status: Single    Spouse name: N/A  . Number of children: N/A  . Years of education: N/A   Occupational History  . 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 Smoking status: Current Every Day Smoker    Packs/day: 0.50    Years: 10.00    Types: Cigarettes  . Smokeless tobacco: Never Used  . Alcohol use 0.0 oz/week     Comment: occasional use  . Drug use: No  . Sexual activity: Yes    Birth control/ protection: None   Other Topics Concern  . Not on file   Social History Narrative   Married-filing for divorced   26 year old daughter   Works as Industrial/product designer    Past Surgical History:  Procedure Laterality Date  . ABDOMINAL HYSTERECTOMY    . ABLATION COLPOCLESIS    . CESAREAN SECTION    . CLEFT PALATE REPAIR    . cyst removal    .  ECTOPIC PREGNANCY SURGERY     x 2  . OOPHORECTOMY Right 2002    Family History  Problem Relation Age of Onset  . Heart attack Maternal Aunt   . Cancer Father     oral cancer    Allergies  Allergen Reactions  . Ibuprofen     REACTION: knots in mouth with SOB  . Labetalol Nausea Only  . Aspirin     Knots in mouth     Current Outpatient Prescriptions on File Prior to Visit  Medication Sig Dispense Refill  . glucose blood (ACCU-CHEK AVIVA) test strip Use to check blood sugar two times daily. 100 each 2  . Insulin Glargine (LANTUS SOLOSTAR) 100 UNIT/ML Solostar Pen INJECT 26 UNITS SUBCUTANEOUSLY AT BEDTIME 15 mL 5  . Insulin Pen Needle (BD ULTRA-FINE  PEN NEEDLES) 29G X 12.7MM MISC Use with lantus solostar for insulin injections. 100 each 1  . INSULIN SYRINGE 1CC/29G (SUNMARK INSULIN SYR 1CC/29G) 29G X 1/2" 1 ML MISC Use for insulin injection once a day. 100 each 2  . Lancets (ACCU-CHEK MULTICLIX) lancets Use as instructed to check blood sugar twice a day.      No current facility-administered medications on file prior to visit.     BP (!) 144/98 (BP Location: Left Arm, Cuff Size: Large)   Pulse (!) 104   Temp 98 F (36.7 C) (Oral)   Resp 16   Ht 5\' 6"  (1.676 m)   Wt 290 lb 12.8 oz (131.9 kg)   LMP 09/13/2011   SpO2 98% Comment: room air  BMI 46.94 kg/m       Objective:   Physical Exam   Physical Exam  Constitutional: Morbidly obese AA female. oriented to person, place, and time. She appears well-developed and well-nourished. No distress.  HENT:  Head: Normocephalic and atraumatic.  Right Ear: Tympanic membrane and ear canal normal.  Left Ear: Tympanic membrane and ear canal normal.  Mouth/Throat: Oropharynx is clear and moist.  Eyes: Pupils are equal, round, and reactive to light. No scleral icterus.  Neck: Normal range of motion. No thyromegaly present.  Cardiovascular: Normal rate and regular rhythm.   No murmur heard. Pulmonary/Chest: Effort normal and breath sounds normal. No respiratory distress. He has no wheezes. She has no rales. She exhibits no tenderness.  Abdominal: Soft. Bowel sounds are normal. She exhibits no distension and no mass. There is no tenderness. There is no rebound and no guarding.  Musculoskeletal: She exhibits no edema.  Lymphadenopathy:    She has no cervical adenopathy.  Neurological: She is alert and oriented to person, place, and time. She has normal patellar reflexes. She exhibits normal muscle tone. Coordination normal.  Skin: Skin is warm and dry.   area of induration/tenderness/erythema right axilla. Psychiatric: She has a normal mood and affect. Her behavior is normal. Judgment and  thought content normal.  Breasts: Examined lying Right: Without masses, retractions, discharge or axillary adenopathy.  Left: Without masses, retractions, discharge or axillary adenopathy. small boil left breast at 11 oclock  Pelvic: deferred      Assessment & Plan:           Assessment & Plan:  EKG tracing is personally reviewed.  EKG notes NSR.  No acute changes.   Vaginal candidiasis- rx with diflucan  Hidradenitis suppurativa- has small boil left breast and area of induration/tenderness/erythema right axilla. Will rx with keflex.

## 2016-06-21 NOTE — Assessment & Plan Note (Signed)
   Discussed smoking cessation. 

## 2016-06-21 NOTE — Assessment & Plan Note (Signed)
Continue sliding scale, lantus, start [place].

## 2016-06-21 NOTE — Assessment & Plan Note (Signed)
Discussed diet/exercise weight loss. Flu shot, pneumovax and tetanus. Obtain routine lab work, mammogram.

## 2016-06-21 NOTE — Progress Notes (Signed)
Pre visit review using our clinic review tool, if applicable. No additional management support is needed unless otherwise documented below in the visit note. 

## 2016-06-22 LAB — HIV ANTIBODY (ROUTINE TESTING W REFLEX): HIV: NONREACTIVE

## 2016-06-23 ENCOUNTER — Telehealth: Payer: Self-pay | Admitting: Family

## 2016-06-23 DIAGNOSIS — E1122 Type 2 diabetes mellitus with diabetic chronic kidney disease: Secondary | ICD-10-CM

## 2016-06-23 NOTE — Telephone Encounter (Signed)
Sugar very uncontrolled.  Cholesterol uncontrolled. Restart lipitor. I would also like to refer her to endocrinology for her sugar.

## 2016-06-25 NOTE — Telephone Encounter (Signed)
Patient notified about results, she voices understanding. Pt states she has appt this Friday to discuss endocrinologist  PC

## 2016-06-26 DIAGNOSIS — H2513 Age-related nuclear cataract, bilateral: Secondary | ICD-10-CM | POA: Diagnosis not present

## 2016-06-26 DIAGNOSIS — E119 Type 2 diabetes mellitus without complications: Secondary | ICD-10-CM | POA: Diagnosis not present

## 2016-06-26 DIAGNOSIS — H35033 Hypertensive retinopathy, bilateral: Secondary | ICD-10-CM | POA: Diagnosis not present

## 2016-06-26 LAB — HM DIABETES EYE EXAM

## 2016-06-28 ENCOUNTER — Ambulatory Visit (INDEPENDENT_AMBULATORY_CARE_PROVIDER_SITE_OTHER): Payer: BLUE CROSS/BLUE SHIELD | Admitting: Family

## 2016-06-28 ENCOUNTER — Encounter: Payer: Self-pay | Admitting: Family

## 2016-06-28 VITALS — BP 147/95 | HR 91 | Temp 98.4°F | Resp 20 | Ht 66.0 in

## 2016-06-28 DIAGNOSIS — E1165 Type 2 diabetes mellitus with hyperglycemia: Secondary | ICD-10-CM | POA: Diagnosis not present

## 2016-06-28 DIAGNOSIS — L02411 Cutaneous abscess of right axilla: Secondary | ICD-10-CM

## 2016-06-28 DIAGNOSIS — Z794 Long term (current) use of insulin: Secondary | ICD-10-CM

## 2016-06-28 DIAGNOSIS — IMO0001 Reserved for inherently not codable concepts without codable children: Secondary | ICD-10-CM

## 2016-06-28 MED ORDER — DOXYCYCLINE HYCLATE 100 MG PO TABS: 100.0000 mg | ORAL_TABLET | Freq: Two times a day (BID) | ORAL | 0 refills | Status: DC

## 2016-06-28 MED FILL — DOXYCYCLINE HYCLATE 100 MG: 100 | 10 days supply | Qty: 20 | Fill #0

## 2016-06-28 NOTE — Progress Notes (Signed)
Pre visit review using our clinic review tool, if applicable. No additional management support is needed unless otherwise documented below in the visit note. 

## 2016-06-28 NOTE — Progress Notes (Signed)
Subjective:    Patient ID: Catherine Espinoza, female    DOB: 1976/04/02, 40 y.o.   MRN: [id number]  HPI  Catherine Espinoza is a 40 yr old female who presents today for 1 week follow up of her hidradentitis abscess of the right axilla. She was prescribed keflex. She reports that it seems better but it continues to drain.   Review of Systems    see HPI  Past Medical History:  Diagnosis Date  . Anemia, unspecified   . Excessive or frequent menstruation   . Morbid obesity ([place])   . Obstructive sleep apnea 02/11/11   Sleep study: severe OSA- rec CPAP 20cm small  full face mask  . Proteinuria   . Type II or unspecified type diabetes mellitus without mention of complication, not stated as uncontrolled   . Unspecified essential hypertension      Social History   Social History  . Marital status: Single    Spouse name: N/A  . Number of children: N/A  . Years of education: N/A   Occupational History  . 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 Smoking status: Current Every Day Smoker    Packs/day: 0.50    Years: 10.00    Types: Cigarettes  . Smokeless tobacco: Never Used  . Alcohol use 0.0 oz/week     Comment: occasional use  . Drug use: No  . Sexual activity: Yes    Birth control/ protection: None   Other Topics Concern  . Not on file   Social History Narrative   Married-filing for divorced   57 year old daughter   Works as Industrial/product designer    Past Surgical History:  Procedure Laterality Date  . ABDOMINAL HYSTERECTOMY    . ABLATION COLPOCLESIS    . CESAREAN SECTION    . CLEFT PALATE REPAIR    . cyst removal    . ECTOPIC PREGNANCY SURGERY     x 2  . OOPHORECTOMY Right 2002    Family History  Problem Relation Age of Onset  . Heart attack Maternal Aunt   . Cancer Father     oral cancer    Allergies  Allergen Reactions  . Ibuprofen     REACTION: knots in mouth with SOB  . Labetalol Nausea Only  . Aspirin     Knots in mouth     Current  Outpatient Prescriptions on File Prior to Visit  Medication Sig Dispense Refill  . atorvastatin (LIPITOR) 40 MG tablet Take 1 tablet (40 mg total) by mouth daily. 30 tablet 5  . cephALEXin (KEFLEX) 500 MG capsule Take 1 capsule (500 mg total) by mouth 3 (three) times daily. 21 capsule 0  . glucose blood (ACCU-CHEK AVIVA) test strip Use to check blood sugar two times daily. 100 each 2  . insulin aspart (NOVOLOG FLEXPEN) 100 UNIT/ML FlexPen Inject per sliding scale TID AC meals sliding scale- check sugar and inject 3 times daily before meals as below:  <150-   Zero units 150-200 2 units 201-250 4 units 251-300 6 units 301-350 8 units 351-400 10 units >400             12 units and contact us. 15 mL 0  . Insulin Pen Needle (BD ULTRA-FINE PEN NEEDLES) 29G X 12.7MM MISC Use with lantus solostar for insulin injections. 100 each 1  . INSULIN SYRINGE 1CC/29G (SUNMARK INSULIN SYR 1CC/29G) 29G X 1/2" 1 ML MISC Use for insulin injection once a day.  100 each 2  . Lancets (ACCU-CHEK MULTICLIX) lancets Use as instructed to check blood sugar twice a day.     Marland Kitchen LANTUS SOLOSTAR 100 UNIT/ML Solostar Pen INJECT 26 UNITS SUBCUTANEOUSLY AT BEDTIME 15 mL 5  . lisinopril (PRINIVIL,ZESTRIL) 10 MG tablet Take 1 tablet (10 mg total) by mouth daily. 30 tablet 1  . omeprazole (PRILOSEC) 40 MG capsule Take 1 capsule (40 mg total) by mouth daily. 30 capsule 5  . sitaGLIPtin (JANUVIA) 100 MG tablet Take 1 tablet (100 mg total) by mouth daily. 30 tablet 3   No current facility-administered medications on file prior to visit.     BP (!) 147/95 (BP Location: Right Arm)   Pulse 91   Temp 98.4 F (36.9 C) (Oral)   Resp 20   Ht 5\' 6"  (1.676 m)   LMP 09/13/2011   SpO2 96% Comment: room air   Objective:   Physical Exam  Constitutional: She appears well-developed and well-nourished. No distress.  Skin: Skin is warm and dry. No rash noted. No erythema. No pallor.  Abscess right axilla-softer but larger and more  fluctuant today. Small sinus tract at top of abscess draining yellow fluid.           Assessment & Plan:  Abscess right axilla- deterioratated. Will change keflex to doxycycline.   I and D performed today:  Procedure including risks/benefits explained to patient.  Questions were answered. After informed consent was obtained and a time out completed, site was cleansed with alcohol. 1% Lidocaine with epinephrine was infiltrated into the abscess. Complex abscess was noted with two separate areas of fluctuance.  Two small incisions were made.  Purulent and blood drainage expressed from the abscess. Pt tolerated procedure well. Pt may use tylenol or motrin as needed for discomfort today.

## 2016-06-28 NOTE — Patient Instructions (Addendum)
Stop keflex, begin doxycycline. Call if you develop fever, increased pain/swelling right arm pit.  You will be contacted about your referral to Endocrinology for your sugar.

## 2016-07-05 ENCOUNTER — Encounter: Payer: Self-pay | Admitting: Family

## 2016-07-05 ENCOUNTER — Ambulatory Visit (INDEPENDENT_AMBULATORY_CARE_PROVIDER_SITE_OTHER): Payer: BLUE CROSS/BLUE SHIELD | Admitting: Family

## 2016-07-05 VITALS — BP 149/96 | HR 91 | Temp 98.1°F | Resp 16 | Ht 66.0 in | Wt 293.6 lb

## 2016-07-05 DIAGNOSIS — L02419 Cutaneous abscess of limb, unspecified: Secondary | ICD-10-CM | POA: Diagnosis not present

## 2016-07-05 DIAGNOSIS — E119 Type 2 diabetes mellitus without complications: Secondary | ICD-10-CM

## 2016-07-05 DIAGNOSIS — Z794 Long term (current) use of insulin: Secondary | ICD-10-CM | POA: Diagnosis not present

## 2016-07-05 DIAGNOSIS — L732 Hidradenitis suppurativa: Secondary | ICD-10-CM | POA: Diagnosis not present

## 2016-07-05 MED ORDER — INSULIN PEN NEEDLE 29G X 12.7MM MISC: 1 refills | Status: DC

## 2016-07-05 MED FILL — BD ULTRA FINE PEN NEEDLES: 29G X 12.7M | 30 days supply | Qty: 100 | Fill #0

## 2016-07-05 NOTE — Progress Notes (Signed)
Subjective:    Patient ID: Catherine Espinoza, female    DOB: [birth year], 40 y.o.   MRN: [id number]  HPI  Catherine Espinoza is a 40 yr old female who presents today for follow up of her right axillary abscess.  Last visit she underwent I and D and her antibiotics were changed from keflex to doxycycline. She reports that abscess is essentially unchanged and continues to drain.   DM2- she has stopped sodas. Have a meat and a vegetable for meals.  sugars have been <200.  She is scheduled to see endocrinology on 07/26/16.  Good compliance with insulin.   Wt Readings from Last 3 Encounters:  07/05/16 293 lb 9.6 oz (133.2 kg)  06/21/16 290 lb 12.8 oz (131.9 kg)  02/11/16 299 lb (135.6 kg)    Review of Systems    see HPI    Past Medical History:  Diagnosis Date  . Anemia, unspecified   . Excessive or frequent menstruation   . Morbid obesity ([place])   . Obstructive sleep apnea 02/11/11   Sleep study: severe OSA- rec CPAP 20cm small  full face mask  . Proteinuria   . Type II or unspecified type diabetes mellitus without mention of complication, not stated as uncontrolled   . Unspecified essential hypertension      Social History   Social History  . Marital status: Single    Spouse name: N/A  . Number of children: N/A  . Years of education: N/A   Occupational History  . 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 Smoking status: Current Every Day Smoker    Packs/day: 0.50    Years: 10.00    Types: Cigarettes  . Smokeless tobacco: Never Used  . Alcohol use 0.0 oz/week     Comment: occasional use  . Drug use: No  . Sexual activity: Yes    Birth control/ protection: None   Other Topics Concern  . Not on file   Social History Narrative   Married-filing for divorced   47 year old daughter   Works as Industrial/product designer    Past Surgical History:  Procedure Laterality Date  . ABDOMINAL HYSTERECTOMY    . ABLATION COLPOCLESIS    . CESAREAN SECTION    . CLEFT PALATE  REPAIR    . cyst removal    . ECTOPIC PREGNANCY SURGERY     x 2  . OOPHORECTOMY Right 2002    Family History  Problem Relation Age of Onset  . Heart attack Maternal Aunt   . Cancer Father     oral cancer    Allergies  Allergen Reactions  . Ibuprofen     REACTION: knots in mouth with SOB  . Labetalol Nausea Only  . Aspirin     Knots in mouth     Current Outpatient Prescriptions on File Prior to Visit  Medication Sig Dispense Refill  . atorvastatin (LIPITOR) 40 MG tablet Take 1 tablet (40 mg total) by mouth daily. 30 tablet 5  . doxycycline (VIBRA-TABS) 100 MG tablet Take 1 tablet (100 mg total) by mouth 2 (two) times daily. 20 tablet 0  . glucose blood (ACCU-CHEK AVIVA) test strip Use to check blood sugar two times daily. 100 each 2  . insulin aspart (NOVOLOG FLEXPEN) 100 UNIT/ML FlexPen Inject per sliding scale TID AC meals sliding scale- check sugar and inject 3 times daily before meals as below:  <150-   Zero units 150-200 2 units 201-250 4 units  251-300 6 units 301-350 8 units 351-400 10 units >400             12 units and contact us. 15 mL 0  . INSULIN SYRINGE 1CC/29G (SUNMARK INSULIN SYR 1CC/29G) 29G X 1/2" 1 ML MISC Use for insulin injection once a day. 100 each 2  . Lancets (ACCU-CHEK MULTICLIX) lancets Use as instructed to check blood sugar twice a day.     Marland Kitchen LANTUS SOLOSTAR 100 UNIT/ML Solostar Pen INJECT 26 UNITS SUBCUTANEOUSLY AT BEDTIME 15 mL 5  . lisinopril (PRINIVIL,ZESTRIL) 10 MG tablet Take 1 tablet (10 mg total) by mouth daily. 30 tablet 1  . omeprazole (PRILOSEC) 40 MG capsule Take 1 capsule (40 mg total) by mouth daily. 30 capsule 5  . sitaGLIPtin (JANUVIA) 100 MG tablet Take 1 tablet (100 mg total) by mouth daily. 30 tablet 3   No current facility-administered medications on file prior to visit.     BP (!) 149/96 (BP Location: Right Arm, Cuff Size: Large)   Pulse 91   Temp 98.1 F (36.7 C) (Oral)   Resp 16   Ht 5\' 6"  (1.676 m)   Wt 293 lb 9.6  oz (133.2 kg)   LMP 09/13/2011   SpO2 97% Comment: room air  BMI 47.39 kg/m    Objective:   Physical Exam  Constitutional: She is oriented to person, place, and time. She appears well-developed and well-nourished.  Musculoskeletal: She exhibits no edema.  Neurological: She is alert and oriented to person, place, and time.  Skin: Skin is warm and dry.  Fluctuant abscess right axilla remains unchanged.   Small opening with purulent drainage  Psychiatric: She has a normal mood and affect. Her behavior is normal. Judgment and thought content normal.          Assessment & Plan:  Axillary abscess- unchanged. At this point, I think we need to get her in to see a general surgeon for I and D.  I think the abscess is complex. Continue doxycycline.  DM2- her sugars are much better with improved compliance and dietary changes. She reports feeling much better.  I commended her for her hard work. She has an upcoming appointment with endocrinology. Continue current meds.

## 2016-07-05 NOTE — Progress Notes (Signed)
Pre visit review using our clinic review tool, if applicable. No additional management support is needed unless otherwise documented below in the visit note. 

## 2016-07-05 NOTE — Patient Instructions (Signed)
You will be contacted about your referral to the surgeon. Continue doxycyline until you see the surgeon. Great job on [employer] and sugars!

## 2016-07-15 MED FILL — LISINOPRIL 10 MG TABLET: 10 | 30 days supply | Qty: 30 | Fill #1

## 2016-07-15 MED FILL — JANUVIA 100 MG TABLET: 100 | 30 days supply | Qty: 30 | Fill #1

## 2016-07-15 MED FILL — ATORVASTATIN 40 MG TABLET: 40 | 30 days supply | Qty: 30 | Fill #1

## 2016-07-15 MED FILL — OMEPRAZOLE DR 40 MG CAPSULE: 40 | 30 days supply | Qty: 30 | Fill #1

## 2016-07-17 ENCOUNTER — Encounter: Payer: Self-pay | Admitting: Family

## 2016-07-26 ENCOUNTER — Encounter: Payer: Self-pay | Admitting: Endocrinology

## 2016-07-26 ENCOUNTER — Ambulatory Visit (INDEPENDENT_AMBULATORY_CARE_PROVIDER_SITE_OTHER): Payer: BLUE CROSS/BLUE SHIELD | Admitting: Endocrinology

## 2016-07-26 ENCOUNTER — Telehealth: Payer: Self-pay | Admitting: Endocrinology

## 2016-07-26 VITALS — BP 126/94 | HR 111 | Ht 66.0 in | Wt 293.0 lb

## 2016-07-26 DIAGNOSIS — IMO0001 Reserved for inherently not codable concepts without codable children: Secondary | ICD-10-CM

## 2016-07-26 DIAGNOSIS — Z794 Long term (current) use of insulin: Secondary | ICD-10-CM

## 2016-07-26 DIAGNOSIS — E1165 Type 2 diabetes mellitus with hyperglycemia: Secondary | ICD-10-CM | POA: Diagnosis not present

## 2016-07-26 MED ORDER — GLUCOSE BLOOD VI STRP: ORAL_STRIP | 4 refills | Status: DC

## 2016-07-26 NOTE — Patient Instructions (Addendum)
good diet and exercise significantly improve the control of your diabetes.  please let me know if you wish to be referred to a dietician.  high blood sugar is very risky to your health.  you should see an eye doctor and dentist every year.  It is very important to get all recommended vaccinations.  Controlling your blood pressure and cholesterol drastically reduces the damage diabetes does to your body.  Those who smoke should quit.  Please discuss these with your doctor.  check your blood sugar twice a day.  vary the time of day when you check, between before the 3 meals, and at bedtime.  also check if you have symptoms of your blood sugar being too high or too low.  please keep a record of the readings and bring it to your next appointment here (or you can bring the meter itself).  You can write it on any piece of paper.  please call us sooner if your blood sugar goes below 70, or if you have a lot of readings over 200.   blood tests are requested for you today.  We'll let you know about the results. Based on the result, we'll try to add a small amount of metformin.  If you have diarrhea with this, we add jardiance instead.  We will need to take this complex situation in stages.   Please come back for a follow-up appointment in 6 weeks.

## 2016-07-26 NOTE — Telephone Encounter (Signed)
Please call in new rx for pt's test strips to [facility], thank you

## 2016-07-26 NOTE — Telephone Encounter (Signed)
Refill submitted per patient's request.  

## 2016-07-26 NOTE — Telephone Encounter (Signed)
[facility] stated patient was given a bayer countor meter, strips for the one touch verio strips were sent in to pharmacy. Please advise

## 2016-07-26 NOTE — Telephone Encounter (Signed)
Refill submitted. 

## 2016-07-26 NOTE — Progress Notes (Signed)
Subjective:    Patient ID: Catherine Espinoza, female    DOB: [birth year], 40 y.o.   MRN: [id number]  HPI pt states DM was dx'ed in 2011; she has mild if any neuropathy of the lower extremities; she is unaware of any associated chronic complications; she has been on insulin since soon after dx; pt says her diet and exercise are both improved; she has never had GDM, pancreatitis, severe hypoglycemia or DKA.  She has had TAH.  She takes lantus, PRN Novolog (seldom takes), and [place].  She says she cannot afford med copays or copays for weight loss surgery.  She says over the past month, cbg's have improved to 128-193.  It is in general higher as the day goes on.  She says this improvement is due to a renewed dietary effort.  Past Medical History:  Diagnosis Date  . Anemia, unspecified   . Excessive or frequent menstruation   . Morbid obesity ([place])   . Obstructive sleep apnea 02/11/11   Sleep study: severe OSA- rec CPAP 20cm small  full face mask  . Proteinuria   . Type II or unspecified type diabetes mellitus without mention of complication, not stated as uncontrolled   . Unspecified essential hypertension     Past Surgical History:  Procedure Laterality Date  . ABDOMINAL HYSTERECTOMY    . ABLATION COLPOCLESIS    . CESAREAN SECTION    . CLEFT PALATE REPAIR    . cyst removal    . ECTOPIC PREGNANCY SURGERY     x 2  . OOPHORECTOMY Right 2002    Social History   Social History  . Marital status: Single    Spouse name: N/A  . Number of children: N/A  . Years of education: N/A   Occupational History  . 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 Smoking status: Current Every Day Smoker    Packs/day: 0.50    Years: 10.00    Types: Cigarettes  . Smokeless tobacco: Never Used  . Alcohol use 0.0 oz/week     Comment: occasional use  . Drug use: No  . Sexual activity: Yes    Birth control/ protection: None   Other Topics Concern  . Not on file   Social History  Narrative   Married-filing for divorced   78 year old daughter   Works as Industrial/product designer    Current Outpatient Prescriptions on File Prior to Visit  Medication Sig Dispense Refill  . atorvastatin (LIPITOR) 40 MG tablet Take 1 tablet (40 mg total) by mouth daily. 30 tablet 5  . insulin aspart (NOVOLOG FLEXPEN) 100 UNIT/ML FlexPen Inject per sliding scale TID AC meals sliding scale- check sugar and inject 3 times daily before meals as below:  <150-   Zero units 150-200 2 units 201-250 4 units 251-300 6 units 301-350 8 units 351-400 10 units >400             12 units and contact us. 15 mL 0  . Insulin Pen Needle (BD ULTRA-FINE PEN NEEDLES) 29G X 12.7MM MISC Use with lantus solostar for insulin injections. 100 each 1  . INSULIN SYRINGE 1CC/29G (SUNMARK INSULIN SYR 1CC/29G) 29G X 1/2" 1 ML MISC Use for insulin injection once a day. 100 each 2  . Lancets (ACCU-CHEK MULTICLIX) lancets Use as instructed to check blood sugar twice a day.     Marland Kitchen LANTUS SOLOSTAR 100 UNIT/ML Solostar Pen INJECT 26 UNITS SUBCUTANEOUSLY AT BEDTIME 15 mL 5  .  lisinopril (PRINIVIL,ZESTRIL) 10 MG tablet Take 1 tablet (10 mg total) by mouth daily. 30 tablet 1  . omeprazole (PRILOSEC) 40 MG capsule Take 1 capsule (40 mg total) by mouth daily. 30 capsule 5   No current facility-administered medications on file prior to visit.     Allergies  Allergen Reactions  . Ibuprofen     REACTION: knots in mouth with SOB  . Labetalol Nausea Only  . Aspirin     Knots in mouth     Family History  Problem Relation Age of Onset  . Heart attack Maternal Aunt   . Diabetes Mother   . Cancer Father     oral cancer    BP (!) 126/94   Pulse (!) 111   Ht 5\' 6"  (1.676 m)   Wt 293 lb (132.9 kg)   LMP 09/13/2011   SpO2 93%   BMI 47.29 kg/m    Review of Systems denies blurry vision, headache, chest pain, sob, n/v, urinary frequency, muscle cramps, excessive diaphoresis, depression, cold intolerance, and rhinorrhea.  She has  lost 40 lbs x a few years.  She has easy bruising.     Objective:   Physical Exam VS: see vs page GEN: no distress.  Morbid obesity HEAD: head: no deformity eyes: no periorbital swelling, no proptosis external nose and ears are normal mouth: no lesion seen NECK: supple, thyroid is not enlarged CHEST WALL: no deformity.  LUNGS: clear to auscultation.  CV: reg rate and rhythm, no murmur.  ABD: abdomen is soft, nontender.  no hepatosplenomegaly.  not distended.  no hernia.  MUSCULOSKELETAL: muscle bulk and strength are grossly normal.  no obvious joint swelling.  gait is normal and steady.  EXTEMITIES: no deformity.  no ulcer on the feet.  feet are of normal color and temp.  no edema.  There is bilateral onychomycosis of the toenails.   PULSES: dorsalis pedis intact bilat.  no carotid bruit NEURO:  cn 2-12 grossly intact.   readily moves all 4's.  sensation is intact to touch on the feet.  SKIN:  Normal texture and temperature.  No rash or suspicious lesion is visible.   NODES:  None palpable at the neck.  PSYCH: alert, well-oriented.  Does not appear anxious nor depressed.   Lab Results  Component Value Date   HGBA1C 14.2 (H) 06/21/2016   Lab Results  Component Value Date   CREATININE 0.57 06/21/2016   BUN 6 06/21/2016   NA 132 (L) 06/21/2016   K 4.0 06/21/2016   CL 98 06/21/2016   CO2 27 06/21/2016   i personally reviewed electrocardiogram tracing (06/21/16): Indication: wellness Impression: normal  I have reviewed outside records, and summarized: Pt was noted to have severely elevated a1c, and referred here.  She was also noted to have axillary abscess, which was rx'ed with abx and I and D.     Assessment & Plan:  Insulin-requiring type 2 DM: severe exacerbation. I gave copay cards.   Morbid obesity: this complicates the rx of DM.  Patient is advised the following: Patient Instructions  good diet and exercise significantly improve the control of your diabetes.  please  let me know if you wish to be referred to a dietician.  high blood sugar is very risky to your health.  you should see an eye doctor and dentist every year.  It is very important to get all recommended vaccinations.  Controlling your blood pressure and cholesterol drastically reduces the damage diabetes does to your  body.  Those who smoke should quit.  Please discuss these with your doctor.  check your blood sugar twice a day.  vary the time of day when you check, between before the 3 meals, and at bedtime.  also check if you have symptoms of your blood sugar being too high or too low.  please keep a record of the readings and bring it to your next appointment here (or you can bring the meter itself).  You can write it on any piece of paper.  please call us sooner if your blood sugar goes below 70, or if you have a lot of readings over 200.   blood tests are requested for you today.  We'll let you know about the results. Based on the result, we'll try to add a small amount of metformin.  If you have diarrhea with this, we add jardiance instead.  We will need to take this complex situation in stages.   Please come back for a follow-up appointment in 6 weeks.

## 2016-07-29 ENCOUNTER — Other Ambulatory Visit: Payer: Self-pay | Admitting: Endocrinology

## 2016-07-29 LAB — FRUCTOSAMINE: Fructosamine: 269 umol/L (ref 190–270)

## 2016-07-29 MED ORDER — INSULIN GLARGINE 100 UNIT/ML SOLOSTAR PEN: 20.0000 [IU] | PEN_INJECTOR | Freq: Every day | SUBCUTANEOUS | 5 refills | Status: DC

## 2016-08-02 MED FILL — LANTUS SOLOSTAR 100 UNITS/M: 100 | 30 days supply | Qty: 6 | Fill #0

## 2016-08-05 DIAGNOSIS — G4733 Obstructive sleep apnea (adult) (pediatric): Secondary | ICD-10-CM | POA: Diagnosis not present

## 2016-08-08 ENCOUNTER — Encounter: Payer: Self-pay | Admitting: Family

## 2016-08-08 DIAGNOSIS — G4733 Obstructive sleep apnea (adult) (pediatric): Secondary | ICD-10-CM | POA: Diagnosis not present

## 2016-08-09 ENCOUNTER — Other Ambulatory Visit: Payer: Self-pay | Admitting: *Deleted

## 2016-08-09 DIAGNOSIS — G4733 Obstructive sleep apnea (adult) (pediatric): Secondary | ICD-10-CM

## 2016-08-10 ENCOUNTER — Telehealth: Payer: Self-pay | Admitting: Family

## 2016-08-10 DIAGNOSIS — G4733 Obstructive sleep apnea (adult) (pediatric): Secondary | ICD-10-CM

## 2016-08-10 NOTE — Telephone Encounter (Signed)
Opened in error

## 2016-08-10 NOTE — Telephone Encounter (Signed)
Could you please contact the sleep center at [facility] and check status on the results of her most recent sleep study?

## 2016-08-10 NOTE — Telephone Encounter (Signed)
I did get her sleep study results.  I think I sent you another note that they were not back yet.  Please let her know that it shows moderate OSA.  I would like her to begin CPAP and will arranged.  (I don't think she has a machine at home but please double check with her). thanks

## 2016-08-12 ENCOUNTER — Other Ambulatory Visit: Payer: Self-pay | Admitting: Family

## 2016-08-12 MED FILL — JANUVIA 100 MG TABLET: 100 | 30 days supply | Qty: 30 | Fill #2

## 2016-08-12 MED FILL — LISINOPRIL 10 MG TABLET: 10 | 30 days supply | Qty: 30 | Fill #0

## 2016-08-12 MED FILL — ATORVASTATIN 40 MG TABLET: 40 | 30 days supply | Qty: 30 | Fill #2

## 2016-08-12 MED FILL — OMEPRAZOLE DR 40 MG CAPSULE: 40 | 30 days supply | Qty: 30 | Fill #2

## 2016-08-12 NOTE — Addendum Note (Signed)
Addended by: Kelle Darting A on: 08/12/2016 07:11 PM   Modules accepted: Orders

## 2016-08-12 NOTE — Telephone Encounter (Signed)
See previous pt email from 08/10/16.

## 2016-08-12 NOTE — Telephone Encounter (Signed)
Order and sleep study result faxed to [facility] at [phone]. Message sent to pt.

## 2016-08-12 NOTE — Telephone Encounter (Signed)
Rx request to pharmacy/[facility]  

## 2016-08-12 NOTE — Telephone Encounter (Signed)
Sent mychart message to pt. Awaiting response.   Catherine Alar, NP      08/10/16 9:57 AM  Note    I did get her sleep study results.  I think I sent you another note that they were not back yet.  Please let her know that it shows moderate OSA.  I would like her to begin CPAP and will arranged.  (I don't think she has a machine at home but please double check with her). thanks

## 2016-08-12 NOTE — Addendum Note (Signed)
Addended by: Kelle Darting A on: 08/12/2016 10:02 AM   Modules accepted: Orders

## 2016-08-14 NOTE — Telephone Encounter (Signed)
Received call from [facility] requesting demographics, insurance card and last office note before the sleep study. Information faxed to [phone]

## 2016-08-15 ENCOUNTER — Encounter: Payer: Self-pay | Admitting: Family

## 2016-08-19 ENCOUNTER — Ambulatory Visit (INDEPENDENT_AMBULATORY_CARE_PROVIDER_SITE_OTHER): Payer: BLUE CROSS/BLUE SHIELD | Admitting: Family

## 2016-08-19 ENCOUNTER — Encounter: Payer: Self-pay | Admitting: Family

## 2016-08-19 VITALS — BP 169/112 | HR 119 | Temp 98.5°F | Resp 16 | Ht 66.0 in | Wt 291.8 lb

## 2016-08-19 DIAGNOSIS — I10 Essential (primary) hypertension: Secondary | ICD-10-CM | POA: Diagnosis not present

## 2016-08-19 DIAGNOSIS — L02412 Cutaneous abscess of left axilla: Secondary | ICD-10-CM

## 2016-08-19 MED ORDER — CEPHALEXIN 500 MG PO CAPS: 500.0000 mg | ORAL_CAPSULE | Freq: Three times a day (TID) | ORAL | 0 refills | Status: DC

## 2016-08-19 MED ORDER — TRAMADOL HCL 50 MG PO TABS: 50.0000 mg | ORAL_TABLET | Freq: Three times a day (TID) | ORAL | 0 refills | Status: DC | PRN

## 2016-08-19 MED ORDER — LISINOPRIL 20 MG PO TABS: 20.0000 mg | ORAL_TABLET | Freq: Every day | ORAL | 1 refills | Status: DC

## 2016-08-19 MED FILL — LISINOPRIL 20 MG TABLET: 20 | 30 days supply | Qty: 30 | Fill #0

## 2016-08-19 MED FILL — traMADol HCL 50 MG TABS: 50 | 7 days supply | Qty: 20 | Fill #0

## 2016-08-19 MED FILL — CEPHALEXIN 500 MG CAPSULE: 500 | 7 days supply | Qty: 21 | Fill #0

## 2016-08-19 NOTE — Patient Instructions (Addendum)
Please begin keflex for the boil beneath your left arm. Apply warm compresses twice daily.  Call if increased swelling, redness, pain, or if you develop fever. Increase lisinopril from 10mg  to 20mg  once daily.

## 2016-08-19 NOTE — Progress Notes (Signed)
Pre visit review using our clinic review tool, if applicable. No additional management support is needed unless otherwise documented below in the visit note. 
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
## 2016-08-26 ENCOUNTER — Encounter: Payer: Self-pay | Admitting: Family

## 2016-08-26 ENCOUNTER — Ambulatory Visit (INDEPENDENT_AMBULATORY_CARE_PROVIDER_SITE_OTHER): Payer: BLUE CROSS/BLUE SHIELD | Admitting: Family

## 2016-08-26 VITALS — BP 156/90 | HR 96 | Temp 97.6°F | Resp 16 | Ht 66.0 in | Wt 291.6 lb

## 2016-08-26 DIAGNOSIS — I10 Essential (primary) hypertension: Secondary | ICD-10-CM | POA: Diagnosis not present

## 2016-08-26 DIAGNOSIS — L02419 Cutaneous abscess of limb, unspecified: Secondary | ICD-10-CM

## 2016-08-26 MED ORDER — AMLODIPINE BESYLATE 5 MG PO TABS: 5.0000 mg | ORAL_TABLET | Freq: Every day | ORAL | 2 refills | Status: DC

## 2016-08-26 MED FILL — AMLODIPINE BESYLATE 5 MG TA: 5 | 30 days supply | Qty: 30 | Fill #0
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
## 2016-08-26 NOTE — Patient Instructions (Addendum)
Please add amlodipine once daily. Complete lab work prior to leaving.

## 2016-08-26 NOTE — Progress Notes (Signed)
Pre visit review using our clinic review tool, if applicable. No additional management support is needed unless otherwise documented below in the visit note. 

## 2016-09-06 ENCOUNTER — Ambulatory Visit: Payer: BLUE CROSS/BLUE SHIELD | Admitting: Endocrinology

## 2016-09-09 ENCOUNTER — Telehealth: Payer: Self-pay | Admitting: Family

## 2016-09-09 ENCOUNTER — Ambulatory Visit: Payer: BLUE CROSS/BLUE SHIELD | Admitting: Family

## 2016-09-09 NOTE — Telephone Encounter (Signed)
Noted. Please do not charge no-show.
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
## 2016-09-25 MED FILL — ATORVASTATIN 40 MG TABLET: 40 | 30 days supply | Qty: 30 | Fill #3

## 2016-09-25 MED FILL — LISINOPRIL 20 MG TABLET: 20 | 30 days supply | Qty: 30 | Fill #1

## 2016-09-25 MED FILL — OMEPRAZOLE DR 40 MG CAPSULE: 40 | 30 days supply | Qty: 30 | Fill #3

## 2016-09-25 MED FILL — JANUVIA 100 MG TABLET: 100 | 30 days supply | Qty: 30 | Fill #3

## 2016-09-25 MED FILL — AMLODIPINE BESYLATE 5 MG TA: 5 | 30 days supply | Qty: 30 | Fill #1

## 2016-09-28 ENCOUNTER — Emergency Department (HOSPITAL_BASED_OUTPATIENT_CLINIC_OR_DEPARTMENT_OTHER)
Admission: EM | Admit: 2016-09-28 | Discharge: 2016-09-28 | Disposition: A | Discharge status: Home / Self Care | Payer: BLUE CROSS/BLUE SHIELD | Attending: Emergency Medicine | Admitting: Emergency Medicine

## 2016-09-28 ENCOUNTER — Encounter (HOSPITAL_BASED_OUTPATIENT_CLINIC_OR_DEPARTMENT_OTHER): Payer: Self-pay | Admitting: *Deleted

## 2016-09-28 DIAGNOSIS — I10 Essential (primary) hypertension: Secondary | ICD-10-CM | POA: Insufficient documentation

## 2016-09-28 DIAGNOSIS — E119 Type 2 diabetes mellitus without complications: Secondary | ICD-10-CM | POA: Diagnosis not present

## 2016-09-28 DIAGNOSIS — B9789 Other viral agents as the cause of diseases classified elsewhere: Secondary | ICD-10-CM

## 2016-09-28 DIAGNOSIS — J069 Acute upper respiratory infection, unspecified: Secondary | ICD-10-CM

## 2016-09-28 DIAGNOSIS — Z794 Long term (current) use of insulin: Secondary | ICD-10-CM | POA: Insufficient documentation

## 2016-09-28 DIAGNOSIS — Z79899 Other long term (current) drug therapy: Secondary | ICD-10-CM | POA: Diagnosis not present

## 2016-09-28 DIAGNOSIS — R05 Cough: Secondary | ICD-10-CM | POA: Diagnosis not present

## 2016-09-28 DIAGNOSIS — F17210 Nicotine dependence, cigarettes, uncomplicated: Secondary | ICD-10-CM | POA: Insufficient documentation

## 2016-09-28 MED ORDER — FLUTICASONE PROPIONATE 50 MCG/ACT NA SUSP
NASAL | 1 refills | Status: DC
Start: 2016-09-28 — End: 2016-10-07

## 2016-09-28 MED ORDER — PREDNISONE 20 MG PO TABS: 20.0000 mg | ORAL_TABLET | Freq: Two times a day (BID) | ORAL | 0 refills | Status: DC

## 2016-09-28 NOTE — Discharge Instructions (Signed)
Claritin, and Sudafed over-the-counter. Prednisone and Flonase prescriptions.

## 2016-09-28 NOTE — ED Provider Notes (Signed)
[facility] Provider Note   CSN: [id number] Arrival date & time: 09/28/16  0813     History   Chief Complaint Chief Complaint  Patient presents with  . Cough    HPI Catherine Espinoza is a 40 y.o. female.HPI  Patient reports a three-day illness. Some body aches periods fever last night. Occasional cough. Dry nonproductive. No chest pain or shortness of breath. Nasal congestion intermittent rhinorrhea. No sinus pain or pressure. No purulent discharge. No GI complaints.  Patient reports that she did get a flu vaccine  Past Medical History:  Diagnosis Date  . Anemia, unspecified   . Excessive or frequent menstruation   . Morbid obesity ([place])   . Obstructive sleep apnea 02/11/11   Sleep study: severe OSA- rec CPAP 20cm small  full face mask  . Proteinuria   . Type II or unspecified type diabetes mellitus without mention of complication, not stated as uncontrolled   . Unspecified essential hypertension     Patient Active Problem List   Diagnosis Date Noted  . Preventative health care 06/21/2016  . GERD (gastroesophageal reflux disease) 12/25/2014  . Onychomycosis 08/11/2014  . HTN (hypertension) 10/11/2013  . Hidradenitis suppurativa 08/02/2011  . Fatigue 08/02/2011  . Obstructive sleep apnea 02/15/2011  . Hypertension 02/15/2011  . Hyperlipidemia 01/16/2011  . PROTEINURIA 11/16/2009  . Diabetes type 2, uncontrolled ([place]) 11/03/2009  . ANEMIA 11/03/2009  . MORBID OBESITY 10/31/2009  . TOBACCO ABUSE 10/31/2009    Past Surgical History:  Procedure Laterality Date  . ABDOMINAL HYSTERECTOMY    . ABLATION COLPOCLESIS    . CESAREAN SECTION    . CLEFT PALATE REPAIR    . cyst removal    . ECTOPIC PREGNANCY SURGERY     x 2  . OOPHORECTOMY Right 2002    OB History    No data available       Home Medications    Prior to Admission medications   Medication Sig Start Date End Date Taking? Authorizing Provider  amLODipine (NORVASC) 5 MG tablet Take 1  tablet (5 mg total) by mouth daily. 08/26/16  Yes [name], NP  atorvastatin (LIPITOR) 40 MG tablet Take 1 tablet (40 mg total) by mouth daily. 06/21/16  Yes [name], NP  glucose blood (BAYER CONTOUR TEST) test strip Use to check blood sugar 2 times per day. 07/26/16  Yes Renato Shin, MD  Insulin Glargine (LANTUS SOLOSTAR) 100 UNIT/ML Solostar Pen Inject 20 Units into the skin daily. 07/29/16  Yes Renato Shin, MD  Insulin Pen Needle (BD ULTRA-FINE PEN NEEDLES) 29G X 12.7MM MISC Use with lantus solostar for insulin injections. 07/05/16  Yes [name], NP  INSULIN SYRINGE 1CC/29G ([place] 1CC/29G) 29G X 1/2" 1 ML MISC Use for insulin injection once a day. 08/14/12  Yes [name], NP  Lancets (ACCU-CHEK MULTICLIX) lancets Use as instructed to check blood sugar twice a day.    Yes Historical Provider, MD  lisinopril (PRINIVIL,ZESTRIL) 20 MG tablet Take 1 tablet (20 mg total) by mouth daily. 08/19/16  Yes [name], NP  omeprazole (PRILOSEC) 40 MG capsule Take 1 capsule (40 mg total) by mouth daily. 06/21/16  Yes [name], NP  SITagliptin Phosphate (JANUVIA PO) Take by mouth.   Yes Historical Provider, MD  fluticasone Asencion Islam) 50 MCG/ACT nasal spray 1 spray each nares twice a day 09/28/16   Tanna Furry, MD  predniSONE (DELTASONE) 20 MG tablet Take 1 tablet (20 mg total) by mouth 2 (two) times daily  with a meal. 09/28/16   Tanna Furry, MD    Family History Family History  Problem Relation Age of Onset  . Heart attack Maternal Aunt   . Diabetes Mother   . Cancer Father     oral cancer    Social History Social History  Substance Use Topics  . Smoking status: Current Every Day Smoker    Packs/day: 0.50    Years: 10.00    Types: Cigarettes  . Smokeless tobacco: Never Used  . Alcohol use 0.0 oz/week     Comment: occasional use     Allergies   Ibuprofen; Labetalol; and Aspirin   Review of Systems Review of Systems    Constitutional: Negative for appetite change, chills, diaphoresis, fatigue and fever.  HENT: Positive for congestion and rhinorrhea. Negative for mouth sores, sore throat and trouble swallowing.   Eyes: Negative for visual disturbance.  Respiratory: Positive for cough. Negative for chest tightness, shortness of breath and wheezing.   Cardiovascular: Negative for chest pain.  Gastrointestinal: Negative for abdominal distention, abdominal pain, diarrhea, nausea and vomiting.  Endocrine: Negative for polydipsia, polyphagia and polyuria.  Genitourinary: Negative for dysuria, frequency and hematuria.  Musculoskeletal: Negative for gait problem.  Skin: Negative for color change, pallor and rash.  Neurological: Negative for dizziness, syncope, light-headedness and headaches.  Hematological: Does not bruise/bleed easily.  Psychiatric/Behavioral: Negative for behavioral problems and confusion.     Physical Exam Updated Vital Signs BP (!) 177/117 (BP Location: Right Arm) Comment: pt reports not taking BP meds today   Pulse 112   Temp 98.6 F (37 C) (Oral)   Resp 20   Ht 5\' 5"  (1.651 m)   Wt 290 lb (131.5 kg)   LMP 09/13/2011   SpO2 98%   BMI 48.26 kg/m   Physical Exam  Constitutional: She is oriented to person, place, and time. She appears well-developed and well-nourished. No distress.  HENT:  Head: Normocephalic.  Eyes: Conjunctivae are normal. Pupils are equal, round, and reactive to light. No scleral icterus.  Neck: Normal range of motion. Neck supple. No thyromegaly present.  Cardiovascular: Normal rate and regular rhythm.  Exam reveals no gallop and no friction rub.   No murmur heard. Pulmonary/Chest: Effort normal and breath sounds normal. No respiratory distress. She has no wheezes. She has no rales.  Abdominal: Soft. Bowel sounds are normal. She exhibits no distension. There is no tenderness. There is no rebound.  Musculoskeletal: Normal range of motion.  Neurological: She  is alert and oriented to person, place, and time.  Skin: Skin is warm and dry. No rash noted.  Psychiatric: She has a normal mood and affect. Her behavior is normal.     ED Treatments / Results  Labs (all labs ordered are listed, but only abnormal results are displayed) Labs Reviewed - No data to display  EKG  EKG Interpretation None       Radiology No results found.  Procedures Procedures (including critical care time)  Medications Ordered in ED Medications - No data to display   Initial Impression / Assessment and Plan / ED Course  I have reviewed the triage vital signs and the nursing notes.  Pertinent labs & imaging results that were available during my care of the patient were reviewed by me and considered in my medical decision making (see chart for details).  Clinical Course     Viral URI  Final Clinical Impressions(s) / ED Diagnoses   Final diagnoses:  Viral URI with cough  New Prescriptions New Prescriptions   FLUTICASONE (FLONASE) 50 MCG/ACT NASAL SPRAY    1 spray each nares twice a day   PREDNISONE (DELTASONE) 20 MG TABLET    Take 1 tablet (20 mg total) by mouth 2 (two) times daily with a meal.     Tanna Furry, MD 09/28/16 [phone]

## 2016-09-28 NOTE — ED Triage Notes (Signed)
Pt reports productive cough since Thursday. Also reports nasal congestion and fever (high of 100.5). Denies n/v/d.

## 2016-09-30 ENCOUNTER — Telehealth: Payer: Self-pay | Admitting: Family

## 2016-09-30 NOTE — Telephone Encounter (Signed)
Please contact pt to arrange follow up visit with me in the next 1 week.

## 2016-10-01 ENCOUNTER — Ambulatory Visit (HOSPITAL_BASED_OUTPATIENT_CLINIC_OR_DEPARTMENT_OTHER)
Admission: RE | Admit: 2016-10-01 | Discharge: 2016-10-01 | Disposition: A | Discharge status: Home / Self Care | Payer: BLUE CROSS/BLUE SHIELD | Source: Ambulatory Visit | Attending: Family | Admitting: Family

## 2016-10-01 ENCOUNTER — Ambulatory Visit (INDEPENDENT_AMBULATORY_CARE_PROVIDER_SITE_OTHER): Payer: BLUE CROSS/BLUE SHIELD | Admitting: Family

## 2016-10-01 ENCOUNTER — Encounter: Payer: Self-pay | Admitting: Family

## 2016-10-01 VITALS — BP 134/86 | HR 89 | Temp 98.6°F | Resp 16 | Ht 65.0 in | Wt 287.6 lb

## 2016-10-01 DIAGNOSIS — R05 Cough: Secondary | ICD-10-CM

## 2016-10-01 DIAGNOSIS — R059 Cough, unspecified: Secondary | ICD-10-CM

## 2016-10-01 MED ORDER — ALBUTEROL SULFATE HFA 108 (90 BASE) MCG/ACT IN AERS: 2.0000 | INHALATION_SPRAY | Freq: Four times a day (QID) | RESPIRATORY_TRACT | 0 refills | Status: DC | PRN

## 2016-10-01 MED ORDER — GUAIFENESIN-CODEINE 100-10 MG/5ML PO SYRP: 5.0000 mL | ORAL_SOLUTION | Freq: Three times a day (TID) | ORAL | 0 refills | Status: DC | PRN

## 2016-10-01 NOTE — Progress Notes (Signed)
Subjective:    Patient ID: Catherine Espinoza, female    DOB: Feb 17, 1976, 41 y.o.   MRN: [id number]  HPI  Ms. Coulson is a 41 yr old female who presents today for ER follow up.  She was seen in the ED on 09/28/16 for viral URI with cough and was placed on flonase and prednisone. She has had cough x 5 days.  She has 1 more day left on her prednisone. Notes mild improving.  Notes that her nasal congestion has improved but seems to be wheezing more now.  She seems to think that her ACE inhibitor is contributing to her cough symptoms.    Incidentally, she was noted to have elevated blood pressure while in the ED.  BP Readings from Last 3 Encounters:  10/01/16 134/86  09/28/16 (!) 157/116  08/26/16 (!) 156/90     Review of Systems See HPI  Past Medical History:  Diagnosis Date  . Anemia, unspecified   . Excessive or frequent menstruation   . Morbid obesity ([place])   . Obstructive sleep apnea 02/11/11   Sleep study: severe OSA- rec CPAP 20cm small  full face mask  . Proteinuria   . Type II or unspecified type diabetes mellitus without mention of complication, not stated as uncontrolled   . Unspecified essential hypertension      Social History   Social History  . Marital status: Single    Spouse name: N/A  . Number of children: N/A  . Years of education: N/A   Occupational History  . 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 Smoking status: Current Every Day Smoker    Packs/day: 0.50    Years: 10.00    Types: Cigarettes  . Smokeless tobacco: Never Used  . Alcohol use 0.0 oz/week     Comment: occasional use  . Drug use: No  . Sexual activity: Yes    Birth control/ protection: Surgical   Other Topics Concern  . Not on file   Social History Narrative   Married-filing for divorced   21 year old daughter   Works as Industrial/product designer    Past Surgical History:  Procedure Laterality Date  . ABDOMINAL HYSTERECTOMY    . ABLATION COLPOCLESIS    . CESAREAN  SECTION    . CLEFT PALATE REPAIR    . cyst removal    . ECTOPIC PREGNANCY SURGERY     x 2  . OOPHORECTOMY Right 2002    Family History  Problem Relation Age of Onset  . Heart attack Maternal Aunt   . Diabetes Mother   . Cancer Father     oral cancer    Allergies  Allergen Reactions  . Ibuprofen     REACTION: knots in mouth with SOB  . Labetalol Nausea Only  . Aspirin     Knots in mouth     Current Outpatient Prescriptions on File Prior to Visit  Medication Sig Dispense Refill  . amLODipine (NORVASC) 5 MG tablet Take 1 tablet (5 mg total) by mouth daily. 30 tablet 2  . atorvastatin (LIPITOR) 40 MG tablet Take 1 tablet (40 mg total) by mouth daily. 30 tablet 5  . fluticasone (FLONASE) 50 MCG/ACT nasal spray 1 spray each nares twice a day 10 g 1  . glucose blood (BAYER CONTOUR TEST) test strip Use to check blood sugar 2 times per day. 100 each 4  . Insulin Glargine (LANTUS SOLOSTAR) 100 UNIT/ML Solostar Pen Inject 20 Units into  the skin daily. 15 mL 5  . Insulin Pen Needle (BD ULTRA-FINE PEN NEEDLES) 29G X 12.7MM MISC Use with lantus solostar for insulin injections. 100 each 1  . INSULIN SYRINGE 1CC/29G (SUNMARK INSULIN SYR 1CC/29G) 29G X 1/2" 1 ML MISC Use for insulin injection once a day. 100 each 2  . Lancets (ACCU-CHEK MULTICLIX) lancets Use as instructed to check blood sugar twice a day.     . lisinopril (PRINIVIL,ZESTRIL) 20 MG tablet Take 1 tablet (20 mg total) by mouth daily. 90 tablet 1  . omeprazole (PRILOSEC) 40 MG capsule Take 1 capsule (40 mg total) by mouth daily. 30 capsule 5  . predniSONE (DELTASONE) 20 MG tablet Take 1 tablet (20 mg total) by mouth 2 (two) times daily with a meal. 10 tablet 0   No current facility-administered medications on file prior to visit.     BP 134/86 (BP Location: Left Arm) Comment (Cuff Size): thigh  Pulse 89   Temp 98.6 F (37 C) (Oral)   Resp 16   Ht 5\' 5"  (1.651 m)   Wt 287 lb 9.6 oz (130.5 kg)   LMP 09/13/2011   SpO2 98%  Comment: room air  BMI 47.86 kg/m       Objective:   Physical Exam  Constitutional: She appears well-developed and well-nourished.  HENT:  Right Ear: Tympanic membrane and ear canal normal.  Left Ear: Tympanic membrane and ear canal normal.  Cardiovascular: Normal rate, regular rhythm and normal heart sounds.   No murmur heard. Pulmonary/Chest: Effort normal. No respiratory distress. She has no decreased breath sounds. She has wheezes in the left lower field. She has rhonchi. She has no rales.  Psychiatric: She has a normal mood and affect. Her behavior is normal. Judgment and thought content normal.          Assessment & Plan:  Cough- will obtain CXR to rule out PNA.  Will add albuterol as needed for wheezing.  Complete prednisone with careful monitoring of sugar. Rx cheratussin as needed at night- advised pt this may cause drowsiness.  If + PNA will add antibiotic. We discussed discontinuing ACE and changing to ARB but she wishes to see how her cough does in the next few weeks before she changes.

## 2016-10-01 NOTE — Progress Notes (Signed)
Pre visit review using our clinic review tool, if applicable. No additional management support is needed unless otherwise documented below in the visit note. 

## 2016-10-01 NOTE — Patient Instructions (Signed)
Please complete x ray on the first floor. Call if recurrent sugars >300. Add albuterol 2 puffs every 6 hours for next few days then as needed for wheezing. Call if new/worsening symptoms or if not improved in 1 week.

## 2016-10-02 NOTE — Telephone Encounter (Signed)
Pt has been scheduled.  °

## 2016-10-07 ENCOUNTER — Ambulatory Visit (INDEPENDENT_AMBULATORY_CARE_PROVIDER_SITE_OTHER): Payer: BLUE CROSS/BLUE SHIELD | Admitting: Family

## 2016-10-07 ENCOUNTER — Encounter: Payer: Self-pay | Admitting: Family

## 2016-10-07 VITALS — BP 130/90 | HR 102 | Temp 98.4°F | Resp 18 | Ht 65.0 in | Wt 291.8 lb

## 2016-10-07 DIAGNOSIS — J209 Acute bronchitis, unspecified: Secondary | ICD-10-CM

## 2016-10-07 DIAGNOSIS — I10 Essential (primary) hypertension: Secondary | ICD-10-CM | POA: Diagnosis not present

## 2016-10-07 MED ORDER — INSULIN PEN NEEDLE 29G X 12.7MM MISC: 1 refills | Status: DC

## 2016-10-07 NOTE — Progress Notes (Signed)
Subjective:    Patient ID: Catherine Espinoza, female    DOB: [birth year], 42 y.o.   MRN: [id number]  HPI  Ms.  Espinoza is a 41 yr old female who presents today for follow up.  HTN- maintained on amlodipine, lisinopril.  BP Readings from Last 3 Encounters:  10/07/16 130/90  10/01/16 134/86  09/28/16 (!) 157/116   Bronchitis- Reports sugars raised briefly with her recent prednisone use.  Reports that her breathing is much better, feels much better.  Slight residual cough.   Review of Systems See HPI  Past Medical History:  Diagnosis Date  . Anemia, unspecified   . Excessive or frequent menstruation   . Morbid obesity ([place])   . Obstructive sleep apnea 02/11/11   Sleep study: severe OSA- rec CPAP 20cm small  full face mask  . Proteinuria   . Type II or unspecified type diabetes mellitus without mention of complication, not stated as uncontrolled   . Unspecified essential hypertension      Social History   Social History  . Marital status: Single    Spouse name: N/A  . Number of children: N/A  . Years of education: N/A   Occupational History  . 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 Smoking status: Current Every Day Smoker    Packs/day: 0.50    Years: 10.00    Types: Cigarettes  . Smokeless tobacco: Never Used  . Alcohol use 0.0 oz/week     Comment: occasional use  . Drug use: No  . Sexual activity: Yes    Birth control/ protection: Surgical   Other Topics Concern  . Not on file   Social History Narrative   Married-filing for divorced   79 year old daughter   Works as Industrial/product designer    Past Surgical History:  Procedure Laterality Date  . ABDOMINAL HYSTERECTOMY    . ABLATION COLPOCLESIS    . CESAREAN SECTION    . CLEFT PALATE REPAIR    . cyst removal    . ECTOPIC PREGNANCY SURGERY     x 2  . OOPHORECTOMY Right 2002    Family History  Problem Relation Age of Onset  . Heart attack Maternal Aunt   . Diabetes Mother   . Cancer  Father     oral cancer    Allergies  Allergen Reactions  . Ibuprofen     REACTION: knots in mouth with SOB  . Labetalol Nausea Only  . Aspirin     Knots in mouth     Current Outpatient Prescriptions on File Prior to Visit  Medication Sig Dispense Refill  . albuterol (PROVENTIL HFA;VENTOLIN HFA) 108 (90 Base) MCG/ACT inhaler Inhale 2 puffs into the lungs every 6 (six) hours as needed for wheezing or shortness of breath. 1 Inhaler 0  . amLODipine (NORVASC) 5 MG tablet Take 1 tablet (5 mg total) by mouth daily. 30 tablet 2  . atorvastatin (LIPITOR) 40 MG tablet Take 1 tablet (40 mg total) by mouth daily. 30 tablet 5  . glucose blood (BAYER CONTOUR TEST) test strip Use to check blood sugar 2 times per day. 100 each 4  . Insulin Glargine (LANTUS SOLOSTAR) 100 UNIT/ML Solostar Pen Inject 20 Units into the skin daily. 15 mL 5  . Insulin Pen Needle (BD ULTRA-FINE PEN NEEDLES) 29G X 12.7MM MISC Use with lantus solostar for insulin injections. 100 each 1  . INSULIN SYRINGE 1CC/29G (SUNMARK INSULIN SYR 1CC/29G) 29G X 1/2" 1 ML  MISC Use for insulin injection once a day. 100 each 2  . JANUVIA 100 MG tablet Take 1 tablet by mouth daily.  3  . Lancets (ACCU-CHEK MULTICLIX) lancets Use as instructed to check blood sugar twice a day.     . lisinopril (PRINIVIL,ZESTRIL) 20 MG tablet Take 1 tablet (20 mg total) by mouth daily. 90 tablet 1  . omeprazole (PRILOSEC) 40 MG capsule Take 1 capsule (40 mg total) by mouth daily. 30 capsule 5   No current facility-administered medications on file prior to visit.     BP 130/90 (BP Location: Right Arm, Cuff Size: Large)   Pulse (!) 102   Temp 98.4 F (36.9 C) (Oral)   Resp 18   Ht 5\' 5"  (1.651 m)   Wt 291 lb 12.8 oz (132.4 kg)   LMP 09/13/2011   SpO2 100% Comment: room air  BMI 48.56 kg/m       Objective:   Physical Exam  Constitutional: She is oriented to person, place, and time. She appears well-developed and well-nourished.  HENT:  Head:  Normocephalic and atraumatic.  Cardiovascular: Normal rate, regular rhythm and normal heart sounds.   No murmur heard. Pulmonary/Chest: Effort normal and breath sounds normal. No respiratory distress. She has no wheezes.  Musculoskeletal: She exhibits no edema.  Neurological: She is alert and oriented to person, place, and time.  Psychiatric: She has a normal mood and affect. Her behavior is normal. Judgment and thought content normal.          Assessment & Plan:  Bronchitis- resolved,monitor.  HTN- BP stable, continue current meds.

## 2016-10-07 NOTE — Progress Notes (Signed)
Pre visit review using our clinic review tool, if applicable. No additional management support is needed unless otherwise documented below in the visit note. 

## 2016-10-07 NOTE — Patient Instructions (Addendum)
Please schedule a follow up appointment with Dr. Loanne Drilling.

## 2016-11-06 ENCOUNTER — Emergency Department (HOSPITAL_BASED_OUTPATIENT_CLINIC_OR_DEPARTMENT_OTHER)
Admission: EM | Admit: 2016-11-06 | Discharge: 2016-11-06 | Disposition: A | Discharge status: Home / Self Care | Payer: BLUE CROSS/BLUE SHIELD | Attending: Emergency Medicine | Admitting: Emergency Medicine

## 2016-11-06 ENCOUNTER — Encounter (HOSPITAL_BASED_OUTPATIENT_CLINIC_OR_DEPARTMENT_OTHER): Payer: Self-pay | Admitting: *Deleted

## 2016-11-06 DIAGNOSIS — Z794 Long term (current) use of insulin: Secondary | ICD-10-CM | POA: Insufficient documentation

## 2016-11-06 DIAGNOSIS — I10 Essential (primary) hypertension: Secondary | ICD-10-CM | POA: Diagnosis not present

## 2016-11-06 DIAGNOSIS — Z79899 Other long term (current) drug therapy: Secondary | ICD-10-CM | POA: Insufficient documentation

## 2016-11-06 DIAGNOSIS — F17210 Nicotine dependence, cigarettes, uncomplicated: Secondary | ICD-10-CM | POA: Diagnosis not present

## 2016-11-06 DIAGNOSIS — L0231 Cutaneous abscess of buttock: Secondary | ICD-10-CM | POA: Diagnosis not present

## 2016-11-06 DIAGNOSIS — E119 Type 2 diabetes mellitus without complications: Secondary | ICD-10-CM | POA: Insufficient documentation

## 2016-11-06 DIAGNOSIS — L0291 Cutaneous abscess, unspecified: Secondary | ICD-10-CM

## 2016-11-06 MED ORDER — LIDOCAINE-EPINEPHRINE (PF) 2 %-1:200000 IJ SOLN: 10.0000 mL | Freq: Once | INTRAMUSCULAR | Status: DC

## 2016-11-06 MED ORDER — CEPHALEXIN 500 MG PO CAPS: 500.0000 mg | ORAL_CAPSULE | Freq: Four times a day (QID) | ORAL | 0 refills | Status: DC

## 2016-11-06 MED ORDER — SULFAMETHOXAZOLE-TRIMETHOPRIM 800-160 MG PO TABS: 1.0000 | ORAL_TABLET | Freq: Two times a day (BID) | ORAL | 0 refills | Status: AC

## 2016-11-06 MED ORDER — LIDOCAINE HCL 2 % IJ SOLN
INTRAMUSCULAR | Status: AC
  Administered 2016-11-06: 400 mg
  Filled 2016-11-06: qty 20

## 2016-11-06 MED ORDER — HYDROCODONE-ACETAMINOPHEN 5-325 MG PO TABS
2.0000 | ORAL_TABLET | Freq: Once | ORAL | Status: AC
  Administered 2016-11-06: 2 via ORAL
  Filled 2016-11-06: qty 2

## 2016-11-06 MED ORDER — HYDROCODONE-ACETAMINOPHEN 5-325 MG PO TABS: 1.0000 | ORAL_TABLET | Freq: Four times a day (QID) | ORAL | 0 refills | Status: DC | PRN

## 2016-11-06 MED FILL — CEPHALEXIN 500 MG CAPSULE: 500 | 7 days supply | Qty: 28 | Fill #0

## 2016-11-06 MED FILL — HYDROCODON-APAP 5-325: 5-325 | 2 days supply | Qty: 12 | Fill #0

## 2016-11-06 MED FILL — SULFAMETHOXAZOLE/TMP DS TAB: 800-160 | 7 days supply | Qty: 14 | Fill #0

## 2016-11-06 NOTE — ED Triage Notes (Signed)
Pt c/o boil to right buttocks x 5 days

## 2016-11-06 NOTE — ED Provider Notes (Signed)
[facility] DEPT [facility] Provider Note   CSN: [id number] Arrival date & time: 11/06/16  1319     History   Chief Complaint Chief Complaint  Patient presents with  . Abscess    HPI Catherine Espinoza is a 41 y.o. female.  Patient with past medical history of diabetes and prior abscess presents to the emergency department with chief complaint of abscess on right buttocks. She states the abscess first appeared about 5 days ago. She reports having intermittent abscesses in the past. She states sometimes it drained on their own, other times she has come to the emergency department. She denies any fevers, chills, nausea, or vomiting. The area is very tender to palpation, and is worsened with sitting and with ambulation and palpation. She denies having taken anything for symptoms. There are no other associated symptoms.   The history is provided by the patient.    Past Medical History:  Diagnosis Date  . Anemia, unspecified   . Excessive or frequent menstruation   . Morbid obesity ([place])   . Obstructive sleep apnea 02/11/11   Sleep study: severe OSA- rec CPAP 20cm small  full face mask  . Proteinuria   . Type II or unspecified type diabetes mellitus without mention of complication, not stated as uncontrolled   . Unspecified essential hypertension     Patient Active Problem List   Diagnosis Date Noted  . Preventative health care 06/21/2016  . GERD (gastroesophageal reflux disease) 12/25/2014  . Onychomycosis 08/11/2014  . HTN (hypertension) 10/11/2013  . Hidradenitis suppurativa 08/02/2011  . Fatigue 08/02/2011  . Obstructive sleep apnea 02/15/2011  . Hypertension 02/15/2011  . Hyperlipidemia 01/16/2011  . PROTEINURIA 11/16/2009  . Diabetes type 2, uncontrolled ([place]) 11/03/2009  . ANEMIA 11/03/2009  . MORBID OBESITY 10/31/2009  . TOBACCO ABUSE 10/31/2009    Past Surgical History:  Procedure Laterality Date  . ABDOMINAL HYSTERECTOMY    . ABLATION COLPOCLESIS    . CESAREAN  SECTION    . CLEFT PALATE REPAIR    . cyst removal    . ECTOPIC PREGNANCY SURGERY     x 2  . OOPHORECTOMY Right 2002    OB History    No data available       Home Medications    Prior to Admission medications   Medication Sig Start Date End Date Taking? Authorizing Provider  albuterol (PROVENTIL HFA;VENTOLIN HFA) 108 (90 Base) MCG/ACT inhaler Inhale 2 puffs into the lungs every 6 (six) hours as needed for wheezing or shortness of breath. 10/01/16   [name], NP  amLODipine (NORVASC) 5 MG tablet Take 1 tablet (5 mg total) by mouth daily. 08/26/16   [name], NP  atorvastatin (LIPITOR) 40 MG tablet Take 1 tablet (40 mg total) by mouth daily. 06/21/16   [name], NP  glucose blood (BAYER CONTOUR TEST) test strip Use to check blood sugar 2 times per day. 07/26/16   Renato Shin, MD  Insulin Glargine (LANTUS SOLOSTAR) 100 UNIT/ML Solostar Pen Inject 20 Units into the skin daily. 07/29/16   Renato Shin, MD  Insulin Pen Needle (BD ULTRA-FINE PEN NEEDLES) 29G X 12.7MM MISC Use with lantus solostar for insulin injections. 10/07/16   [name], NP  INSULIN SYRINGE 1CC/29G ([place] 1CC/29G) 29G X 1/2" 1 ML MISC Use for insulin injection once a day. 08/14/12   [name], NP  JANUVIA 100 MG tablet Take 1 tablet by mouth daily. 09/25/16   Historical Provider, MD  Lancets (ACCU-CHEK MULTICLIX) lancets Use  as instructed to check blood sugar twice a day.     Historical Provider, MD  lisinopril (PRINIVIL,ZESTRIL) 20 MG tablet Take 1 tablet (20 mg total) by mouth daily. 08/19/16   [name], NP  omeprazole (PRILOSEC) 40 MG capsule Take 1 capsule (40 mg total) by mouth daily. 06/21/16   [name], NP    Family History Family History  Problem Relation Age of Onset  . Heart attack Maternal Aunt   . Diabetes Mother   . Cancer Father     oral cancer    Social History Social History  Substance Use Topics  . Smoking status:  Current Every Day Smoker    Packs/day: 0.50    Years: 10.00    Types: Cigarettes  . Smokeless tobacco: Never Used  . Alcohol use 0.0 oz/week     Comment: occasional use     Allergies   Ibuprofen; Labetalol; and Aspirin   Review of Systems Review of Systems  All other systems reviewed and are negative.    Physical Exam Updated Vital Signs BP 140/98   Pulse 110   Temp 98.3 F (36.8 C)   Resp 20   Ht 5\' 5"  (1.651 m)   Wt 136.1 kg   LMP 09/13/2011   SpO2 98%   BMI 49.92 kg/m   Physical Exam Physical Exam  Constitutional: Pt is oriented to person, place, and time. Pt appears well-developed and well-nourished. No distress.  HENT:  Head: Normocephalic and atraumatic.  Eyes: Conjunctivae are normal. No scleral icterus.  Neck: Normal range of motion.  Cardiovascular: Normal rate, regular rhythm and intact distal pulses.   Pulmonary/Chest: Effort normal and breath sounds normal.  Abdominal: Soft. Pt exhibits no distension. There is no tenderness.  Lymphadenopathy:    Pt has no cervical adenopathy.  Neurological: Pt is alert and oriented to person, place, and time.  Skin: 4 x 4 centimeter abscess to right buttock, with surrounding erythema and induration consistent with cellulitis.  Psychiatric: Pt has a normal mood and affect.  Nursing note and vitals reviewed.    ED Treatments / Results  Labs (all labs ordered are listed, but only abnormal results are displayed) Labs Reviewed - No data to display  EKG  EKG Interpretation None       Radiology No results found.  Procedures Procedures (including critical care time) INCISION AND DRAINAGE Performed by: Montine Circle Consent: Verbal consent obtained. Risks and benefits: risks, benefits and alternatives were discussed Type: abscess  Body area: Right Buttock  Anesthesia: local infiltration  Incision was made with a scalpel.  Local anesthetic: lidocaine 2% without epinephrine  Anesthetic total: 8  ml  Complexity: complex Blunt dissection to break up loculations  Drainage: purulent  Drainage amount: copious  Packing material: 1/4 in iodoform gauze  Patient tolerance: Patient tolerated the procedure well with no immediate complications.    Medications Ordered in ED Medications  HYDROcodone-acetaminophen (NORCO/VICODIN) 5-325 MG per tablet 2 tablet (not administered)  lidocaine-EPINEPHrine (XYLOCAINE W/EPI) 2 %-1:200000 (PF) injection 10 mL (not administered)     Initial Impression / Assessment and Plan / ED Course  I have reviewed the triage vital signs and the nursing notes.  Pertinent labs & imaging results that were available during my care of the patient were reviewed by me and considered in my medical decision making (see chart for details).     Patient with cellulitis and abscess right buttock. No apparent involvement of the rectal tissue. Will incise and drain in the ED.  Abscess adequately drain in the emergency department. The necrotic tissue overlying the top the abscess was debrided, the base of the wound was adequately explored and irrigated. Packing material was placed. Patient does have cellulitic changes surrounding the incision site, will place patient on antibiotics. Recommend close follow-up in 2 days for wound check and packing removal/replacement.  Final Clinical Impressions(s) / ED Diagnoses   Final diagnoses:  Abscess    New Prescriptions New Prescriptions   CEPHALEXIN (KEFLEX) 500 MG CAPSULE    Take 1 capsule (500 mg total) by mouth 4 (four) times daily.   HYDROCODONE-ACETAMINOPHEN (NORCO/VICODIN) 5-325 MG TABLET    Take 1-2 tablets by mouth every 6 (six) hours as needed.   SULFAMETHOXAZOLE-TRIMETHOPRIM (BACTRIM DS,SEPTRA DS) 800-160 MG TABLET    Take 1 tablet by mouth 2 (two) times daily.     Montine Circle, PA-C 11/06/16 [place], MD 11/06/16 2356

## 2016-11-06 NOTE — ED Notes (Signed)
ED Provider at bedside. 

## 2016-11-07 ENCOUNTER — Telehealth: Payer: Self-pay | Admitting: Family

## 2016-11-07 ENCOUNTER — Other Ambulatory Visit: Payer: Self-pay | Admitting: Family

## 2016-11-07 MED FILL — OMEPRAZOLE DR 40 MG CAPSULE: 40 | 30 days supply | Qty: 30 | Fill #4

## 2016-11-07 MED FILL — LISINOPRIL 20 MG TABLET: 20 | 30 days supply | Qty: 30 | Fill #2

## 2016-11-07 MED FILL — ATORVASTATIN 40 MG TABLET: 40 | 30 days supply | Qty: 30 | Fill #4

## 2016-11-07 MED FILL — AMLODIPINE BESYLATE 5 MG TA: 5 | 30 days supply | Qty: 30 | Fill #2

## 2016-11-07 NOTE — Telephone Encounter (Signed)
Patient needs ed follow up with me or another provider tomorrow please.

## 2016-11-07 NOTE — Telephone Encounter (Signed)
Pt has been scheduled w/ PCP for 11/08/16 at 9:45a

## 2016-11-07 NOTE — Telephone Encounter (Signed)
Noted  

## 2016-11-08 ENCOUNTER — Encounter: Payer: Self-pay | Admitting: Family

## 2016-11-08 ENCOUNTER — Ambulatory Visit (INDEPENDENT_AMBULATORY_CARE_PROVIDER_SITE_OTHER): Payer: BLUE CROSS/BLUE SHIELD | Admitting: Family

## 2016-11-08 ENCOUNTER — Telehealth: Payer: Self-pay | Admitting: *Deleted

## 2016-11-08 VITALS — BP 141/97 | HR 112 | Temp 98.6°F | Resp 16 | Ht 65.0 in | Wt 294.0 lb

## 2016-11-08 DIAGNOSIS — I10 Essential (primary) hypertension: Secondary | ICD-10-CM

## 2016-11-08 DIAGNOSIS — L0231 Cutaneous abscess of buttock: Secondary | ICD-10-CM | POA: Diagnosis not present

## 2016-11-08 MED ORDER — JANUVIA 100 MG PO TABS: 100.0000 mg | ORAL_TABLET | Freq: Every day | ORAL | 3 refills | Status: DC

## 2016-11-08 MED ORDER — AMLODIPINE BESYLATE 10 MG PO TABS: 10.0000 mg | ORAL_TABLET | Freq: Every day | ORAL | 2 refills | Status: DC

## 2016-11-08 MED FILL — JANUVIA 100 MG TABLET: 100 | 30 days supply | Qty: 30 | Fill #0

## 2016-11-08 NOTE — Telephone Encounter (Signed)
Received call from Chad at [facility] stating they have not received rxs for januvia or amlodipine. Rxs were sent to [facility]. Cancelled Rxs at [facility] per Gerald Stabs and re-sent them to [facility].

## 2016-11-08 NOTE — Progress Notes (Signed)
Subjective:    Patient ID: Catherine Espinoza, female    DOB: 21-Aug-1976, 42 y.o.   MRN: [id number]  HPI  Catherine Espinoza is a 41 yr old female who presents today for ED follow up of an abscess/cellulitis of the right buttock. She underwent I and D on 11/06/16 with wound packing and was discharged home on Keflex.   HTN- Current BP meds include lisinopril, amlodipine 5mg .   BP Readings from Last 3 Encounters:  11/08/16 (!) 141/97  11/06/16 140/98  10/07/16 130/90    Review of Systems See HPI  Past Medical History:  Diagnosis Date  . Anemia, unspecified   . Excessive or frequent menstruation   . Morbid obesity ([place])   . Obstructive sleep apnea 02/11/11   Sleep study: severe OSA- rec CPAP 20cm small  full face mask  . Proteinuria   . Type II or unspecified type diabetes mellitus without mention of complication, not stated as uncontrolled   . Unspecified essential hypertension      Social History   Social History  . Marital status: Single    Spouse name: N/A  . Number of children: N/A  . Years of education: N/A   Occupational History  . 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 Smoking status: Current Every Day Smoker    Packs/day: 0.50    Years: 10.00    Types: Cigarettes  . Smokeless tobacco: Never Used  . Alcohol use 0.0 oz/week     Comment: occasional use  . Drug use: No  . Sexual activity: Yes    Birth control/ protection: Surgical   Other Topics Concern  . Not on file   Social History Narrative   Married-filing for divorced   9 year old daughter   Works as Industrial/product designer    Past Surgical History:  Procedure Laterality Date  . ABDOMINAL HYSTERECTOMY    . ABLATION COLPOCLESIS    . CESAREAN SECTION    . CLEFT PALATE REPAIR    . cyst removal    . ECTOPIC PREGNANCY SURGERY     x 2  . OOPHORECTOMY Right 2002    Family History  Problem Relation Age of Onset  . Heart attack Maternal Aunt   . Diabetes Mother   . Cancer Father     oral  cancer    Allergies  Allergen Reactions  . Ibuprofen     REACTION: knots in mouth with SOB  . Labetalol Nausea Only  . Aspirin     Knots in mouth     Current Outpatient Prescriptions on File Prior to Visit  Medication Sig Dispense Refill  . albuterol (PROVENTIL HFA;VENTOLIN HFA) 108 (90 Base) MCG/ACT inhaler Inhale 2 puffs into the lungs every 6 (six) hours as needed for wheezing or shortness of breath. 1 Inhaler 0  . amLODipine (NORVASC) 5 MG tablet Take 1 tablet (5 mg total) by mouth daily. 30 tablet 2  . atorvastatin (LIPITOR) 40 MG tablet Take 1 tablet (40 mg total) by mouth daily. 30 tablet 5  . cephALEXin (KEFLEX) 500 MG capsule Take 1 capsule (500 mg total) by mouth 4 (four) times daily. 28 capsule 0  . glucose blood (BAYER CONTOUR TEST) test strip Use to check blood sugar 2 times per day. 100 each 4  . HYDROcodone-acetaminophen (NORCO/VICODIN) 5-325 MG tablet Take 1-2 tablets by mouth every 6 (six) hours as needed. 12 tablet 0  . Insulin Glargine (LANTUS SOLOSTAR) 100 UNIT/ML Solostar Pen Inject 20  Units into the skin daily. 15 mL 5  . Insulin Pen Needle (BD ULTRA-FINE PEN NEEDLES) 29G X 12.7MM MISC Use with lantus solostar for insulin injections. 100 each 1  . INSULIN SYRINGE 1CC/29G (SUNMARK INSULIN SYR 1CC/29G) 29G X 1/2" 1 ML MISC Use for insulin injection once a day. 100 each 2  . JANUVIA 100 MG tablet Take 1 tablet by mouth daily.  3  . Lancets (ACCU-CHEK MULTICLIX) lancets Use as instructed to check blood sugar twice a day.     . lisinopril (PRINIVIL,ZESTRIL) 20 MG tablet Take 1 tablet (20 mg total) by mouth daily. 90 tablet 1  . omeprazole (PRILOSEC) 40 MG capsule Take 1 capsule (40 mg total) by mouth daily. 30 capsule 5  . sulfamethoxazole-trimethoprim (BACTRIM DS,SEPTRA DS) 800-160 MG tablet Take 1 tablet by mouth 2 (two) times daily. 14 tablet 0   No current facility-administered medications on file prior to visit.     BP (!) 141/97 (BP Location: Left Arm, Cuff  Size: Large)   Pulse (!) 112   Temp 98.6 F (37 C) (Oral)   Resp 16   Ht 5\' 5"  (1.651 m)   Wt 294 lb (133.4 kg)   LMP 09/13/2011   SpO2 100%   BMI 48.92 kg/m       Objective:   Physical Exam  Constitutional: She is oriented to person, place, and time. She appears well-developed and well-nourished. No distress.  Neurological: She is alert and oriented to person, place, and time.  Skin: Skin is warm and dry.  Open wound right buttock, approximately 1 inch in diameter, clean base scant sanguinous drainage. Minimal hyperpigmentation of surrounding skin without erythema.  Wound is about 1 inch deep without obvious tracking.           Assessment & Plan:  Abscess of buttock- packing was removed, wound was irrigated with sterile saline and repacked. Pt advised to continue antibiotics, keep dry over the weekend, return Monday for re-evaluation.  HTN- uncontrolled. Will increase amlodipine from 5mg  to 10mg .

## 2016-11-08 NOTE — Progress Notes (Signed)
Pre visit review using our clinic review tool, if applicable. No additional management support is needed unless otherwise documented below in the visit note. buttocks

## 2016-11-08 NOTE — Patient Instructions (Addendum)
Continue bactrim and keflex. Keep wound dry. Increase amlodipine from 5mg  to 10mg .

## 2016-11-08 NOTE — Addendum Note (Signed)
Addended by: Kelle Darting A on: 11/08/2016 12:16 PM   Modules accepted: Orders

## 2016-11-11 ENCOUNTER — Ambulatory Visit: Payer: BLUE CROSS/BLUE SHIELD | Admitting: Family

## 2016-11-11 ENCOUNTER — Encounter: Payer: Self-pay | Admitting: Family Medicine

## 2016-11-11 ENCOUNTER — Ambulatory Visit (INDEPENDENT_AMBULATORY_CARE_PROVIDER_SITE_OTHER): Payer: BLUE CROSS/BLUE SHIELD | Admitting: Family Medicine

## 2016-11-11 VITALS — BP 130/78 | HR 93 | Temp 98.1°F | Ht 65.0 in | Wt 293.2 lb

## 2016-11-11 DIAGNOSIS — T8149XA Infection following a procedure, other surgical site, initial encounter: Secondary | ICD-10-CM

## 2016-11-11 NOTE — Progress Notes (Signed)
Packing removed from area on R buttock. No evidence of infection. Granulation tissue noted on the periphery. I would like her to return in 1 week to recheck her wound. There will not be a charge today.  Letter given to return to work.   Pt voiced understanding and agreement to the plan.  [place], DO 11/11/16 4:27 PM

## 2016-11-11 NOTE — Progress Notes (Signed)
Pre visit review using our clinic review tool, if applicable. No additional management support is needed unless otherwise documented below in the visit note. 

## 2016-11-13 ENCOUNTER — Encounter: Payer: Self-pay | Admitting: Family

## 2016-11-15 ENCOUNTER — Ambulatory Visit (INDEPENDENT_AMBULATORY_CARE_PROVIDER_SITE_OTHER): Payer: BLUE CROSS/BLUE SHIELD | Admitting: Family Medicine

## 2016-11-15 ENCOUNTER — Encounter: Payer: Self-pay | Admitting: Family Medicine

## 2016-11-15 VITALS — BP 145/89 | HR 104 | Temp 98.5°F | Ht 66.0 in | Wt 297.8 lb

## 2016-11-15 DIAGNOSIS — S31819D Unspecified open wound of right buttock, subsequent encounter: Secondary | ICD-10-CM | POA: Diagnosis not present

## 2016-11-15 NOTE — Progress Notes (Signed)
Pre visit review using our clinic review tool, if applicable. No additional management support is needed unless otherwise documented below in the visit note. 

## 2016-11-15 NOTE — Progress Notes (Signed)
Chief Complaint  Patient presents with  . Wound Check    Pt reports wound on bottom and needs checking     Subjective: Patient is a 41 y.o. female here for wound f/u.  The patient had an incision and drainage around 2 weeks ago for an abscess on her right buttock. She followed up with her PCP and had some iodine gauze packed. She follow-up with me on 11/11/16 and had this removed. Warning signs/symptoms were given at that time and she was told to follow-up in one week with her main PCP. She did not think her wound is healing normally and now is here for follow-up. She denies any drainage, pain, fevers, or spreading redness. She had an abscess drained on her chest in the past and was forced to see the wound care center when it failed to heal appropriately.   ROS: Const: no fevers  Family History  Problem Relation Age of Onset  . Heart attack Maternal Aunt   . Diabetes Mother   . Cancer Father     oral cancer   Past Medical History:  Diagnosis Date  . Anemia, unspecified   . Excessive or frequent menstruation   . Morbid obesity ([place])   . Obstructive sleep apnea 02/11/11   Sleep study: severe OSA- rec CPAP 20cm small  full face mask  . Proteinuria   . Type II or unspecified type diabetes mellitus without mention of complication, not stated as uncontrolled   . Unspecified essential hypertension    Allergies  Allergen Reactions  . Ibuprofen     REACTION: knots in mouth with SOB  . Labetalol Nausea Only  . Aspirin     Knots in mouth     Current Outpatient Prescriptions:  .  albuterol (PROVENTIL HFA;VENTOLIN HFA) 108 (90 Base) MCG/ACT inhaler, Inhale 2 puffs into the lungs every 6 (six) hours as needed for wheezing or shortness of breath., Disp: 1 Inhaler, Rfl: 0 .  amLODipine (NORVASC) 10 MG tablet, Take 1 tablet (10 mg total) by mouth daily., Disp: 30 tablet, Rfl: 2 .  atorvastatin (LIPITOR) 40 MG tablet, Take 1 tablet (40 mg total) by mouth daily., Disp: 30 tablet, Rfl: 5 .   glucose blood (BAYER CONTOUR TEST) test strip, Use to check blood sugar 2 times per day., Disp: 100 each, Rfl: 4 .  HYDROcodone-acetaminophen (NORCO/VICODIN) 5-325 MG tablet, Take 1-2 tablets by mouth every 6 (six) hours as needed., Disp: 12 tablet, Rfl: 0 .  Insulin Glargine (LANTUS SOLOSTAR) 100 UNIT/ML Solostar Pen, Inject 20 Units into the skin daily., Disp: 15 mL, Rfl: 5 .  Insulin Pen Needle (BD ULTRA-FINE PEN NEEDLES) 29G X 12.7MM MISC, Use with lantus solostar for insulin injections., Disp: 100 each, Rfl: 1 .  INSULIN SYRINGE 1CC/29G (SUNMARK INSULIN SYR 1CC/29G) 29G X 1/2" 1 ML MISC, Use for insulin injection once a day., Disp: 100 each, Rfl: 2 .  JANUVIA 100 MG tablet, Take 1 tablet (100 mg total) by mouth daily., Disp: 30 tablet, Rfl: 3 .  Lancets (ACCU-CHEK MULTICLIX) lancets, Use as instructed to check blood sugar twice a day. , Disp: , Rfl:  .  lisinopril (PRINIVIL,ZESTRIL) 20 MG tablet, Take 1 tablet (20 mg total) by mouth daily., Disp: 90 tablet, Rfl: 1 .  omeprazole (PRILOSEC) 40 MG capsule, Take 1 capsule (40 mg total) by mouth daily., Disp: 30 capsule, Rfl: 5  Objective: BP (!) 145/89 (BP Location: Left Arm, Patient Position: Sitting, Cuff Size: Large)   Pulse (!) 104  Temp 98.5 F (36.9 C) (Oral)   Ht 5\' 6"  (1.676 m)   Wt 297 lb 12.8 oz (135.1 kg)   LMP 09/13/2011   SpO2 94%   BMI 48.07 kg/m  General: Awake, appears stated age Lungs: No accessory muscle use Skin: On the right buttock, there is a 2 cm x 1.7 cm wound. The edges are pink, granulation tissue is present, I do not appreciate any foul odor or purulent material. There is no fluctuance. There is no tenderness to palpation. Psych: Age appropriate judgment and insight, normal affect and mood  Assessment and Plan: Wound of right buttock, subsequent encounter - Plan: Ambulatory referral to Wound Clinic  Orders as above. After reviewing previous note, appears to be improving, but will refer for 2nd opinion.   Warning signs/symptoms discussed on when to seek emergent care such as fever, purulent drainage, spreading redness, increasing pain, or foul odor. F/u in 2 weeks to recheck BP. The patient voiced understanding and agreement to the plan.  [place], DO 11/15/16  4:55 PM

## 2016-11-20 DIAGNOSIS — L98492 Non-pressure chronic ulcer of skin of other sites with fat layer exposed: Secondary | ICD-10-CM | POA: Diagnosis not present

## 2016-11-20 DIAGNOSIS — L723 Sebaceous cyst: Secondary | ICD-10-CM | POA: Diagnosis not present

## 2016-11-20 DIAGNOSIS — K219 Gastro-esophageal reflux disease without esophagitis: Secondary | ICD-10-CM | POA: Diagnosis not present

## 2016-11-20 DIAGNOSIS — E785 Hyperlipidemia, unspecified: Secondary | ICD-10-CM | POA: Diagnosis not present

## 2016-11-20 DIAGNOSIS — G4733 Obstructive sleep apnea (adult) (pediatric): Secondary | ICD-10-CM | POA: Diagnosis not present

## 2016-11-20 DIAGNOSIS — E119 Type 2 diabetes mellitus without complications: Secondary | ICD-10-CM | POA: Diagnosis not present

## 2016-11-20 DIAGNOSIS — F17210 Nicotine dependence, cigarettes, uncomplicated: Secondary | ICD-10-CM | POA: Diagnosis not present

## 2016-11-20 DIAGNOSIS — L98412 Non-pressure chronic ulcer of buttock with fat layer exposed: Secondary | ICD-10-CM | POA: Diagnosis not present

## 2016-11-20 DIAGNOSIS — Z794 Long term (current) use of insulin: Secondary | ICD-10-CM | POA: Diagnosis not present

## 2016-11-20 DIAGNOSIS — I10 Essential (primary) hypertension: Secondary | ICD-10-CM | POA: Diagnosis not present

## 2016-11-22 ENCOUNTER — Telehealth: Payer: Self-pay | Admitting: Family

## 2016-11-22 ENCOUNTER — Ambulatory Visit: Payer: BLUE CROSS/BLUE SHIELD | Admitting: Family

## 2016-11-22 NOTE — Telephone Encounter (Signed)
Ok, no charge please.  

## 2016-11-22 NOTE — Telephone Encounter (Signed)
Pt called in at 8:33. She said that she no longer need her 2:00 appt today because it was to follow up on her wound. Pt says that she has a appt with wound care today.

## 2016-11-26 MED FILL — AMLODIPINE BESYLATE 10 MG T: 10 | 30 days supply | Qty: 30 | Fill #0

## 2016-11-27 DIAGNOSIS — G4733 Obstructive sleep apnea (adult) (pediatric): Secondary | ICD-10-CM | POA: Diagnosis not present

## 2016-11-27 DIAGNOSIS — E119 Type 2 diabetes mellitus without complications: Secondary | ICD-10-CM | POA: Diagnosis not present

## 2016-11-27 DIAGNOSIS — K219 Gastro-esophageal reflux disease without esophagitis: Secondary | ICD-10-CM | POA: Diagnosis not present

## 2016-11-27 DIAGNOSIS — L723 Sebaceous cyst: Secondary | ICD-10-CM | POA: Diagnosis not present

## 2016-11-27 DIAGNOSIS — I10 Essential (primary) hypertension: Secondary | ICD-10-CM | POA: Diagnosis not present

## 2016-11-27 DIAGNOSIS — Z794 Long term (current) use of insulin: Secondary | ICD-10-CM | POA: Diagnosis not present

## 2016-11-27 DIAGNOSIS — E785 Hyperlipidemia, unspecified: Secondary | ICD-10-CM | POA: Diagnosis not present

## 2016-11-27 DIAGNOSIS — F17210 Nicotine dependence, cigarettes, uncomplicated: Secondary | ICD-10-CM | POA: Diagnosis not present

## 2016-11-27 DIAGNOSIS — L98412 Non-pressure chronic ulcer of buttock with fat layer exposed: Secondary | ICD-10-CM | POA: Diagnosis not present

## 2016-12-02 ENCOUNTER — Encounter: Payer: Self-pay | Admitting: Family

## 2016-12-03 NOTE — Telephone Encounter (Signed)
Please call this patient and and schedule her for follow up in April

## 2016-12-04 DIAGNOSIS — F17200 Nicotine dependence, unspecified, uncomplicated: Secondary | ICD-10-CM | POA: Diagnosis not present

## 2016-12-04 DIAGNOSIS — Z794 Long term (current) use of insulin: Secondary | ICD-10-CM | POA: Diagnosis not present

## 2016-12-04 DIAGNOSIS — L98412 Non-pressure chronic ulcer of buttock with fat layer exposed: Secondary | ICD-10-CM | POA: Diagnosis not present

## 2016-12-04 DIAGNOSIS — L723 Sebaceous cyst: Secondary | ICD-10-CM | POA: Diagnosis not present

## 2016-12-04 DIAGNOSIS — D649 Anemia, unspecified: Secondary | ICD-10-CM | POA: Diagnosis not present

## 2016-12-04 DIAGNOSIS — E119 Type 2 diabetes mellitus without complications: Secondary | ICD-10-CM | POA: Diagnosis not present

## 2016-12-04 DIAGNOSIS — E785 Hyperlipidemia, unspecified: Secondary | ICD-10-CM | POA: Diagnosis not present

## 2016-12-04 DIAGNOSIS — G4733 Obstructive sleep apnea (adult) (pediatric): Secondary | ICD-10-CM | POA: Diagnosis not present

## 2016-12-04 DIAGNOSIS — I10 Essential (primary) hypertension: Secondary | ICD-10-CM | POA: Diagnosis not present

## 2016-12-16 NOTE — Telephone Encounter (Signed)
Pt has been scheduled.  °

## 2016-12-17 MED FILL — LISINOPRIL 20 MG TABLET: 20 | 30 days supply | Qty: 30 | Fill #3

## 2016-12-17 MED FILL — OMEPRAZOLE DR 40 MG CAPSULE: 40 | 30 days supply | Qty: 30 | Fill #5

## 2016-12-17 MED FILL — JANUVIA 100 MG TABLET: 100 | 30 days supply | Qty: 30 | Fill #1

## 2016-12-17 MED FILL — ATORVASTATIN 40 MG TABLET: 40 | 30 days supply | Qty: 30 | Fill #5

## 2017-01-13 ENCOUNTER — Encounter: Payer: Self-pay | Admitting: Family

## 2017-01-13 ENCOUNTER — Ambulatory Visit (INDEPENDENT_AMBULATORY_CARE_PROVIDER_SITE_OTHER): Payer: BLUE CROSS/BLUE SHIELD | Admitting: Family

## 2017-01-13 VITALS — BP 120/78 | HR 96 | Temp 98.4°F | Resp 18 | Ht 65.0 in | Wt 301.2 lb

## 2017-01-13 DIAGNOSIS — E785 Hyperlipidemia, unspecified: Secondary | ICD-10-CM

## 2017-01-13 DIAGNOSIS — F40243 Fear of flying: Secondary | ICD-10-CM | POA: Diagnosis not present

## 2017-01-13 DIAGNOSIS — E1165 Type 2 diabetes mellitus with hyperglycemia: Secondary | ICD-10-CM

## 2017-01-13 DIAGNOSIS — I10 Essential (primary) hypertension: Secondary | ICD-10-CM

## 2017-01-13 DIAGNOSIS — IMO0001 Reserved for inherently not codable concepts without codable children: Secondary | ICD-10-CM

## 2017-01-13 MED ORDER — LORAZEPAM 0.5 MG PO TABS: ORAL_TABLET | ORAL | 0 refills | Status: DC

## 2017-01-13 NOTE — Assessment & Plan Note (Signed)
BP stable on current meds. Continue same.  

## 2017-01-13 NOTE — Progress Notes (Signed)
Subjective:    Patient ID: Catherine Espinoza, female    DOB: Nov 13, 1975, 41 y.o.   MRN: [id number]  HPI  Catherine Espinoza is a 41 yr old female who presents today for follow up.  1) HTN- BP Readings from Last 3 Encounters:  01/13/17 120/78  11/15/16 (!) 145/89  11/11/16 130/78   2) Hyperlipidemia- on lipitor. Lab Results  Component Value Date   CHOL 248 (H) 06/21/2016   HDL 37.90 (L) 06/21/2016   LDLCALC 180 (H) 06/21/2016   LDLDIRECT 164.9 08/11/2014   TRIG 152.0 (H) 06/21/2016   CHOLHDL 7 06/21/2016   3) DM2- established with Dr. Loanne Drilling on 07/26/16.  She was advised to follow up with him in 6 weeks. She did not follow up. She reports that he decreased her lantus.  She has stopped sodas and is drinking crystal light instead. Reports that she has been more compliant with her medications. Has started zumba.   Lab Results  Component Value Date   HGBA1C 14.2 (H) 06/21/2016   HGBA1C 10.8 (H) 12/19/2014   HGBA1C 10.4 (H) 08/11/2014   Lab Results  Component Value Date   MICROALBUR 6.6 (H) 06/21/2016   LDLCALC 180 (H) 06/21/2016   CREATININE 0.57 06/21/2016   4) Anxiety- requests rx for something for her anxiety related to [place] has a 6 hour flight out [place] coming up.   Reports that wound is resolved on her buttock.    Review of Systems    see HPI  Past Medical History:  Diagnosis Date  . Anemia, unspecified   . Excessive or frequent menstruation   . Morbid obesity ([place])   . Obstructive sleep apnea 02/11/11   Sleep study: severe OSA- rec CPAP 20cm small  full face mask  . Proteinuria   . Type II or unspecified type diabetes mellitus without mention of complication, not stated as uncontrolled   . Unspecified essential hypertension      Social History   Social History  . Marital status: Single    Spouse name: N/A  . Number of children: N/A  . Years of education: N/A   Occupational History  . 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 Smoking  status: Current Every Day Smoker    Packs/day: 0.50    Years: 10.00    Types: Cigarettes  . Smokeless tobacco: Never Used  . Alcohol use 0.0 oz/week     Comment: occasional use  . Drug use: No  . Sexual activity: Yes    Birth control/ protection: Surgical   Other Topics Concern  . Not on file   Social History Narrative   Married-filing for divorced   68 year old daughter   Works as Industrial/product designer    Past Surgical History:  Procedure Laterality Date  . ABDOMINAL HYSTERECTOMY    . ABLATION COLPOCLESIS    . CESAREAN SECTION    . CLEFT PALATE REPAIR    . cyst removal    . ECTOPIC PREGNANCY SURGERY     x 2  . OOPHORECTOMY Right 2002    Family History  Problem Relation Age of Onset  . Heart attack Maternal Aunt   . Diabetes Mother   . Cancer Father     oral cancer    Allergies  Allergen Reactions  . Ibuprofen     REACTION: knots in mouth with SOB  . Labetalol Nausea Only  . Aspirin     Knots in mouth     Current  Outpatient Prescriptions on File Prior to Visit  Medication Sig Dispense Refill  . albuterol (PROVENTIL HFA;VENTOLIN HFA) 108 (90 Base) MCG/ACT inhaler Inhale 2 puffs into the lungs every 6 (six) hours as needed for wheezing or shortness of breath. 1 Inhaler 0  . amLODipine (NORVASC) 10 MG tablet Take 1 tablet (10 mg total) by mouth daily. 30 tablet 2  . atorvastatin (LIPITOR) 40 MG tablet Take 1 tablet (40 mg total) by mouth daily. 30 tablet 5  . glucose blood (BAYER CONTOUR TEST) test strip Use to check blood sugar 2 times per day. 100 each 4  . Insulin Glargine (LANTUS SOLOSTAR) 100 UNIT/ML Solostar Pen Inject 20 Units into the skin daily. 15 mL 5  . Insulin Pen Needle (BD ULTRA-FINE PEN NEEDLES) 29G X 12.7MM MISC Use with lantus solostar for insulin injections. 100 each 1  . INSULIN SYRINGE 1CC/29G (SUNMARK INSULIN SYR 1CC/29G) 29G X 1/2" 1 ML MISC Use for insulin injection once a day. 100 each 2  . JANUVIA 100 MG tablet Take 1 tablet (100 mg total) by  mouth daily. 30 tablet 3  . Lancets (ACCU-CHEK MULTICLIX) lancets Use as instructed to check blood sugar twice a day.     . lisinopril (PRINIVIL,ZESTRIL) 20 MG tablet Take 1 tablet (20 mg total) by mouth daily. 90 tablet 1  . omeprazole (PRILOSEC) 40 MG capsule Take 1 capsule (40 mg total) by mouth daily. 30 capsule 5   No current facility-administered medications on file prior to visit.     BP 120/78 (BP Location: Right Arm) Comment (Cuff Size): thigh  Pulse 96   Temp 98.4 F (36.9 C) (Oral)   Resp 18   Ht 5\' 5"  (1.651 m)   Wt (!) 301 lb 3.2 oz (136.6 kg)   LMP 09/13/2011   SpO2 97% Comment: room air  BMI 50.12 kg/m    Objective:   Physical Exam  Constitutional: She is oriented to person, place, and time. She appears well-developed and well-nourished.  HENT:  Head: Normocephalic and atraumatic.  Cardiovascular: Normal rate, regular rhythm and normal heart sounds.   No murmur heard. Pulmonary/Chest: Effort normal and breath sounds normal. No respiratory distress. She has no wheezes.  Musculoskeletal: She exhibits no edema.  Neurological: She is alert and oriented to person, place, and time.  Psychiatric: She has a normal mood and affect. Her behavior is normal. Judgment and thought content normal.          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

## 2017-01-13 NOTE — Assessment & Plan Note (Signed)
Obtain lipid panel

## 2017-01-13 NOTE — Assessment & Plan Note (Signed)
Reports improved sugars.  Better diet. Obtain A1C, advised patient to arrange follow up appointment with Dr. Loanne Drilling.

## 2017-01-13 NOTE — Progress Notes (Signed)
Pre visit review using our clinic review tool, if applicable. No additional management support is needed unless otherwise documented below in the visit note. 

## 2017-01-13 NOTE — Patient Instructions (Signed)
Please complete lab work prior to leaving.  Schedule follow up with Dr. Loanne Drilling.

## 2017-01-14 LAB — COMPREHENSIVE METABOLIC PANEL
ALK PHOS: 76 U/L (ref 39–117)
ALT: 16 U/L (ref 0–35)
AST: 10 U/L (ref 0–37)
Albumin: 3.6 g/dL (ref 3.5–5.2)
BUN: 11 mg/dL (ref 6–23)
CHLORIDE: 102 meq/L (ref 96–112)
CO2: 27 mEq/L (ref 19–32)
Calcium: 9.4 mg/dL (ref 8.4–10.5)
Creatinine, Ser: 0.58 mg/dL (ref 0.40–1.20)
GFR: 147.35 mL/min (ref >60.00)
GLUCOSE: 199 mg/dL — AB (ref 70–99)
Potassium: 3.6 mEq/L (ref 3.5–5.1)
SODIUM: 135 meq/L (ref 135–145)
TOTAL PROTEIN: 7.2 g/dL (ref 6.0–8.3)
Total Bilirubin: 0.3 mg/dL (ref 0.2–1.2)

## 2017-01-14 LAB — LIPID PANEL
Cholesterol: 191 mg/dL (ref 0–200)
HDL: 34.6 mg/dL — ABNORMAL LOW (ref >39.00)
LDL Cholesterol: 126 mg/dL — ABNORMAL HIGH (ref 0–99)
NONHDL: 156.3
Total CHOL/HDL Ratio: 6
Triglycerides: 152 mg/dL — ABNORMAL HIGH (ref 0.0–149.0)
VLDL: 30.4 mg/dL (ref 0.0–40.0)

## 2017-01-14 LAB — HEMOGLOBIN A1C: HEMOGLOBIN A1C: 10.2 % — AB (ref 4.6–6.5)

## 2017-01-27 MED FILL — AMLODIPINE BESYLATE 10 MG T: 10 | 30 days supply | Qty: 30 | Fill #1

## 2017-02-09 ENCOUNTER — Encounter: Payer: Self-pay | Admitting: Family

## 2017-02-10 NOTE — Telephone Encounter (Signed)
Patient will need office visit for antibiotics. I will need to check her vitals and make sure that it does not need to be drained. OK to arrange with another provider if I am busy.

## 2017-02-11 ENCOUNTER — Ambulatory Visit: Payer: BLUE CROSS/BLUE SHIELD | Admitting: Family

## 2017-02-11 ENCOUNTER — Other Ambulatory Visit: Payer: Self-pay | Admitting: Family

## 2017-02-11 NOTE — Telephone Encounter (Signed)
OK to cancel appointment please.

## 2017-02-12 ENCOUNTER — Ambulatory Visit: Payer: BLUE CROSS/BLUE SHIELD | Admitting: Medical

## 2017-02-28 MED FILL — OMEPRAZOLE DR 40 MG CAPSULE: 40 | 30 days supply | Qty: 30 | Fill #0

## 2017-02-28 MED FILL — AMLODIPINE BESYLATE 10 MG T: 10 | 30 days supply | Qty: 30 | Fill #2

## 2017-02-28 MED FILL — LISINOPRIL 20 MG TABLET: 20 | 30 days supply | Qty: 30 | Fill #4

## 2017-02-28 MED FILL — JANUVIA 100 MG TABLET: 100 | 30 days supply | Qty: 30 | Fill #2

## 2017-02-28 MED FILL — ATORVASTATIN 40 MG TABLET: 40 | 30 days supply | Qty: 30 | Fill #0

## 2017-04-30 MED FILL — JANUVIA 100 MG TABLET: 100 | 30 days supply | Qty: 30 | Fill #3

## 2017-04-30 MED FILL — ATORVASTATIN 40 MG TABLET: 40 | 30 days supply | Qty: 30 | Fill #1

## 2017-04-30 MED FILL — LISINOPRIL 20 MG TABLET: 20 | 30 days supply | Qty: 30 | Fill #5

## 2017-04-30 MED FILL — OMEPRAZOLE DR 40 MG CAPSULE: 40 | 30 days supply | Qty: 30 | Fill #1

## 2017-06-27 ENCOUNTER — Encounter: Payer: Self-pay | Admitting: Family

## 2017-06-27 ENCOUNTER — Ambulatory Visit (INDEPENDENT_AMBULATORY_CARE_PROVIDER_SITE_OTHER): Payer: BLUE CROSS/BLUE SHIELD | Admitting: Family

## 2017-06-27 VITALS — BP 143/87 | HR 111 | Temp 98.4°F | Resp 16 | Ht 65.0 in | Wt 301.6 lb

## 2017-06-27 DIAGNOSIS — N611 Abscess of the breast and nipple: Secondary | ICD-10-CM | POA: Diagnosis not present

## 2017-06-27 NOTE — Progress Notes (Signed)
Subjective:    Patient ID: Catherine Espinoza, female    DOB: [birth year], 41 y.o.   MRN: [id number]  HPI   Catherine Espinoza is a 41 yr old female who presents today with chief complaint of left sided mastalgia.  She has hx of cellulitis and abscess of the right breast.  Reports symptoms off and on for 1 year but worsened recently. No drainage.    Review of Systems See HPI  Past Medical History:  Diagnosis Date  . Anemia, unspecified   . Excessive or frequent menstruation   . Morbid obesity ([place])   . Obstructive sleep apnea 02/11/11   Sleep study: severe OSA- rec CPAP 20cm small  full face mask  . Proteinuria   . Type II or unspecified type diabetes mellitus without mention of complication, not stated as uncontrolled   . Unspecified essential hypertension      Social History   Social History  . Marital status: Single    Spouse name: N/A  . Number of children: N/A  . Years of education: N/A   Occupational History  . 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 Smoking status: Current Every Day Smoker    Packs/day: 0.50    Years: 10.00    Types: Cigarettes  . Smokeless tobacco: Never Used  . Alcohol use 0.0 oz/week     Comment: occasional use  . Drug use: No  . Sexual activity: Yes    Birth control/ protection: Surgical   Other Topics Concern  . Not on file   Social History Narrative   Married-filing for divorced   71 year old daughter   Works as Industrial/product designer    Past Surgical History:  Procedure Laterality Date  . ABDOMINAL HYSTERECTOMY    . ABLATION COLPOCLESIS    . CESAREAN SECTION    . CLEFT PALATE REPAIR    . cyst removal    . ECTOPIC PREGNANCY SURGERY     x 2  . OOPHORECTOMY Right 2002    Family History  Problem Relation Age of Onset  . Heart attack Maternal Aunt   . Diabetes Mother   . Cancer Father        oral cancer    Allergies  Allergen Reactions  . Ibuprofen     REACTION: knots in mouth with SOB  . Labetalol Nausea Only    . Aspirin     Knots in mouth     Current Outpatient Prescriptions on File Prior to Visit  Medication Sig Dispense Refill  . albuterol (PROVENTIL HFA;VENTOLIN HFA) 108 (90 Base) MCG/ACT inhaler Inhale 2 puffs into the lungs every 6 (six) hours as needed for wheezing or shortness of breath. 1 Inhaler 0  . amLODipine (NORVASC) 10 MG tablet Take 1 tablet (10 mg total) by mouth daily. 30 tablet 2  . atorvastatin (LIPITOR) 40 MG tablet TAKE 1 TABLET (40 MG TOTAL) BY MOUTH DAILY. 30 tablet 5  . glucose blood (BAYER CONTOUR TEST) test strip Use to check blood sugar 2 times per day. 100 each 4  . Insulin Glargine (LANTUS SOLOSTAR) 100 UNIT/ML Solostar Pen Inject 20 Units into the skin daily. 15 mL 5  . Insulin Pen Needle (BD ULTRA-FINE PEN NEEDLES) 29G X 12.7MM MISC Use with lantus solostar for insulin injections. 100 each 1  . INSULIN SYRINGE 1CC/29G (SUNMARK INSULIN SYR 1CC/29G) 29G X 1/2" 1 ML MISC Use for insulin injection once a day. 100 each 2  . JANUVIA 100  MG tablet Take 1 tablet (100 mg total) by mouth daily. 30 tablet 3  . Lancets (ACCU-CHEK MULTICLIX) lancets Use as instructed to check blood sugar twice a day.     . lisinopril (PRINIVIL,ZESTRIL) 20 MG tablet Take 1 tablet (20 mg total) by mouth daily. 90 tablet 1  . LORazepam (ATIVAN) 0.5 MG tablet One tablet by mouth prior to flight 2 tablet 0  . omeprazole (PRILOSEC) 40 MG capsule TAKE 1 CAPSULE (40 MG TOTAL) BY MOUTH DAILY. 30 capsule 5   No current facility-administered medications on file prior to visit.     BP (!) 143/87 (BP Location: Right Arm, Cuff Size: Large)   Pulse (!) 111   Temp 98.4 F (36.9 C) (Oral)   Resp 16   Ht 5\' 5"  (1.651 m)   Wt (!) 301 lb 9.6 oz (136.8 kg)   LMP 09/13/2011   BMI 50.19 kg/m       Objective:   Physical Exam  Constitutional: She appears well-developed and well-nourished.  Psychiatric: She has a normal mood and affect. Her behavior is normal. Judgment and thought content normal.  Breast:  Left breast with erythema beneath areola, approximately 2 inch wide subareolar area of tenderness/fluctuance. Also has some erythema/fluctuance left breast at 2 oclock.         Assessment & Plan:  Breast abscess-  Left breast abscess/cellulitis. + abscess noted left breast. I really think she needs [place] and surgical consult. Will refer to ER for further evaluation. ER physician unavailable for report at med center ED.  Report given to Baxter Hire, Camera operator.

## 2017-06-27 NOTE — Patient Instructions (Signed)
Please proceed to the ER on the first floor for further evaluation.  

## 2017-07-02 ENCOUNTER — Emergency Department (HOSPITAL_BASED_OUTPATIENT_CLINIC_OR_DEPARTMENT_OTHER)
Admission: EM | Admit: 2017-07-02 | Discharge: 2017-07-02 | Disposition: A | Discharge status: Home / Self Care | Payer: BLUE CROSS/BLUE SHIELD | Attending: Emergency Medicine | Admitting: Emergency Medicine

## 2017-07-02 ENCOUNTER — Emergency Department (HOSPITAL_BASED_OUTPATIENT_CLINIC_OR_DEPARTMENT_OTHER): Payer: BLUE CROSS/BLUE SHIELD

## 2017-07-02 ENCOUNTER — Encounter (HOSPITAL_BASED_OUTPATIENT_CLINIC_OR_DEPARTMENT_OTHER): Payer: Self-pay | Admitting: Emergency Medicine

## 2017-07-02 DIAGNOSIS — E119 Type 2 diabetes mellitus without complications: Secondary | ICD-10-CM | POA: Diagnosis not present

## 2017-07-02 DIAGNOSIS — F17210 Nicotine dependence, cigarettes, uncomplicated: Secondary | ICD-10-CM | POA: Insufficient documentation

## 2017-07-02 DIAGNOSIS — L02411 Cutaneous abscess of right axilla: Secondary | ICD-10-CM | POA: Diagnosis not present

## 2017-07-02 DIAGNOSIS — Z79899 Other long term (current) drug therapy: Secondary | ICD-10-CM | POA: Insufficient documentation

## 2017-07-02 DIAGNOSIS — R609 Edema, unspecified: Secondary | ICD-10-CM

## 2017-07-02 DIAGNOSIS — N6323 Unspecified lump in the left breast, lower outer quadrant: Secondary | ICD-10-CM | POA: Diagnosis not present

## 2017-07-02 DIAGNOSIS — N63 Unspecified lump in unspecified breast: Secondary | ICD-10-CM | POA: Diagnosis not present

## 2017-07-02 DIAGNOSIS — L02412 Cutaneous abscess of left axilla: Secondary | ICD-10-CM | POA: Insufficient documentation

## 2017-07-02 DIAGNOSIS — Z794 Long term (current) use of insulin: Secondary | ICD-10-CM | POA: Diagnosis not present

## 2017-07-02 DIAGNOSIS — N611 Abscess of the breast and nipple: Secondary | ICD-10-CM | POA: Insufficient documentation

## 2017-07-02 DIAGNOSIS — N6324 Unspecified lump in the left breast, lower inner quadrant: Secondary | ICD-10-CM | POA: Diagnosis not present

## 2017-07-02 DIAGNOSIS — R222 Localized swelling, mass and lump, trunk: Secondary | ICD-10-CM | POA: Diagnosis not present

## 2017-07-02 MED ORDER — SULFAMETHOXAZOLE-TRIMETHOPRIM 800-160 MG PO TABS: 1.0000 | ORAL_TABLET | Freq: Two times a day (BID) | ORAL | 0 refills | Status: AC

## 2017-07-02 MED ORDER — OXYCODONE-ACETAMINOPHEN 5-325 MG PO TABS
2.0000 | ORAL_TABLET | Freq: Once | ORAL | Status: AC
  Administered 2017-07-02: 2 via ORAL
  Filled 2017-07-02: qty 2

## 2017-07-02 MED ORDER — OXYCODONE-ACETAMINOPHEN 5-325 MG PO TABS: 1.0000 | ORAL_TABLET | Freq: Four times a day (QID) | ORAL | 0 refills | Status: DC | PRN

## 2017-07-02 MED ORDER — SULFAMETHOXAZOLE-TRIMETHOPRIM 800-160 MG PO TABS
1.0000 | ORAL_TABLET | Freq: Once | ORAL | Status: AC
  Administered 2017-07-02: 1 via ORAL
  Filled 2017-07-02: qty 1

## 2017-07-02 NOTE — ED Triage Notes (Signed)
Pt reports abscess to L breast. Was seen last Friday by PCP who was concerned and told her to come to ED but pt went home instead. Pt reports worsening pain and now reports abscess under each axilla. Hx of cellulitis to R breast requiring surgery.

## 2017-07-02 NOTE — Discharge Instructions (Signed)
Please read and follow all provided instructions.  Your diagnoses today include:  1. Breast abscess   2. Swelling   3. Abscess of left axilla   4. Abscess of axilla, right    Tests performed today include:  Vital signs. See below for your results today.   Medications prescribed:   Bactrim (trimethoprim/sulfamethoxazole) - antibiotic  You have been prescribed an antibiotic medicine: take the entire course of medicine even if you are feeling better. Stopping early can cause the antibiotic not to work.   Percocet (oxycodone/acetaminophen) - narcotic pain medication  DO NOT drive or perform any activities that require you to be awake and alert because this medicine can make you drowsy. BE VERY CAREFUL not to take multiple medicines containing Tylenol (also called acetaminophen). Doing so can lead to an overdose which can damage your liver and cause liver failure and possibly death.  Take any prescribed medications only as directed.   Home care instructions:   Follow any educational materials contained in this packet  Follow-up instructions: Call the breast center tomorrow for follow-up.  Return instructions:  Return to the Emergency Department if you have:  Fever  Worsening symptoms  Worsening pain  Worsening swelling  Redness of the skin that moves away from the affected area, especially if it streaks away from the affected area   Any other emergent concerns  Your vital signs today were: BP (!) 151/110 (BP Location: Right Arm)    Pulse 100    Temp 98.1 F (36.7 C) (Oral)    Resp 18    Wt (!) 136.5 kg (301 lb)    LMP 09/13/2011    SpO2 96%    BMI 50.09 kg/m  If your blood pressure (BP) was elevated above 135/85 this visit, please have this repeated by your doctor within one month. --------------

## 2017-07-02 NOTE — ED Provider Notes (Signed)
[facility] Provider Note   CSN: [id number] Arrival date & time: 07/02/17  1724     History   Chief Complaint Chief Complaint  Patient presents with  . Abscess    HPI Catherine Espinoza is a 41 y.o. female.  Patient with history of diabetes, breast abscess, hidradenitis -- presents with complaint of painful left breast abscess. She states that she has had waxing and waning symptoms in this area for about a year however over the past week it has become very swollen and tender. Symptoms are severe. She has also developed an area of tender swelling in her left and right axilla. She denies any drainage. No fevers, nausea or vomiting. Patient was seen by her primary care physician on 06/27/17 and told to go to the emergency department for imaging and possible surgery consultation. Patient decided that she did not want to be seen because with her previous right breast abscess, she required surgery and a wound VAC. She presents today because of worsening pain. Symptoms not controlled at home with over-the-counter meds.      Past Medical History:  Diagnosis Date  . Anemia, unspecified   . Excessive or frequent menstruation   . Morbid obesity ([place])   . Obstructive sleep apnea 02/11/11   Sleep study: severe OSA- rec CPAP 20cm small  full face mask  . Proteinuria   . Type II or unspecified type diabetes mellitus without mention of complication, not stated as uncontrolled   . Unspecified essential hypertension     Patient Active Problem List   Diagnosis Date Noted  . Preventative health care 06/21/2016  . GERD (gastroesophageal reflux disease) 12/25/2014  . Onychomycosis 08/11/2014  . HTN (hypertension) 10/11/2013  . Hidradenitis suppurativa 08/02/2011  . Fatigue 08/02/2011  . Obstructive sleep apnea 02/15/2011  . Hypertension 02/15/2011  . Hyperlipidemia 01/16/2011  . PROTEINURIA 11/16/2009  . Diabetes type 2, uncontrolled ([place]) 11/03/2009  . ANEMIA 11/03/2009  . MORBID  OBESITY 10/31/2009  . TOBACCO ABUSE 10/31/2009    Past Surgical History:  Procedure Laterality Date  . ABDOMINAL HYSTERECTOMY    . ABLATION COLPOCLESIS    . CESAREAN SECTION    . CLEFT PALATE REPAIR    . cyst removal    . ECTOPIC PREGNANCY SURGERY     x 2  . OOPHORECTOMY Right 2002    OB History    No data available       Home Medications    Prior to Admission medications   Medication Sig Start Date End Date Taking? Authorizing Provider  amLODipine (NORVASC) 10 MG tablet Take 1 tablet (10 mg total) by mouth daily. 11/08/16  Yes [name], NP  atorvastatin (LIPITOR) 40 MG tablet TAKE 1 TABLET (40 MG TOTAL) BY MOUTH DAILY. 02/11/17  Yes [name], NP  Insulin Glargine (LANTUS SOLOSTAR) 100 UNIT/ML Solostar Pen Inject 20 Units into the skin daily. 07/29/16  Yes Renato Shin, MD  JANUVIA 100 MG tablet Take 1 tablet (100 mg total) by mouth daily. 11/08/16  Yes [name], NP  lisinopril (PRINIVIL,ZESTRIL) 20 MG tablet Take 1 tablet (20 mg total) by mouth daily. 08/19/16  Yes [name], NP  albuterol (PROVENTIL HFA;VENTOLIN HFA) 108 (90 Base) MCG/ACT inhaler Inhale 2 puffs into the lungs every 6 (six) hours as needed for wheezing or shortness of breath. 10/01/16   [name], NP  glucose blood (BAYER CONTOUR TEST) test strip Use to check blood sugar 2 times per day. 07/26/16   Renato Shin, MD  Insulin Pen Needle (BD ULTRA-FINE PEN NEEDLES) 29G X 12.7MM MISC Use with lantus solostar for insulin injections. 10/07/16   [name], NP  INSULIN SYRINGE 1CC/29G (Minor 1CC/29G) 29G X 1/2" 1 ML MISC Use for insulin injection once a day. 08/14/12   [name], NP  Lancets (ACCU-CHEK MULTICLIX) lancets Use as instructed to check blood sugar twice a day.     [provider]  LORazepam (ATIVAN) 0.5 MG tablet One tablet by mouth prior to flight 01/13/17   [name], NP  omeprazole (PRILOSEC) 40 MG capsule  TAKE 1 CAPSULE (40 MG TOTAL) BY MOUTH DAILY. 02/11/17   [name], NP    Family History Family History  Problem Relation Age of Onset  . Heart attack Maternal Aunt   . Diabetes Mother   . Cancer Father        oral cancer    Social History Social History  Substance Use Topics  . Smoking status: Current Every Day Smoker    Packs/day: 0.50    Years: 10.00    Types: Cigarettes  . Smokeless tobacco: Never Used  . Alcohol use 0.0 oz/week     Comment: occasional use     Allergies   Ibuprofen; Labetalol; and Aspirin   Review of Systems Review of Systems  Constitutional: Negative for fever.  HENT: Negative for rhinorrhea and sore throat.   Eyes: Negative for redness.  Respiratory: Negative for cough.   Cardiovascular: Negative for chest pain.  Gastrointestinal: Negative for abdominal pain, diarrhea, nausea and vomiting.  Genitourinary: Negative for dysuria.  Musculoskeletal: Negative for myalgias.  Skin: Positive for color change. Negative for rash.       Positive for abscess.  Neurological: Negative for headaches.  Hematological: Negative for adenopathy.     Physical Exam Updated Vital Signs BP (!) 151/110 (BP Location: Right Arm)   Pulse 100   Temp 98.1 F (36.7 C) (Oral)   Resp 18   Wt (!) 136.5 kg (301 lb)   LMP 09/13/2011   SpO2 96%   BMI 50.09 kg/m   Physical Exam  Constitutional: She appears well-developed and well-nourished.  HENT:  Head: Normocephalic and atraumatic.  Mouth/Throat: Oropharynx is clear and moist.  Eyes: Conjunctivae are normal. Right eye exhibits no discharge. Left eye exhibits no discharge.  Neck: Normal range of motion. Neck supple.  Cardiovascular: Normal rate, regular rhythm and normal heart sounds.   Pulmonary/Chest: Effort normal and breath sounds normal. No respiratory distress. She has no wheezes. She has no rales.  Abdominal: Soft. There is no tenderness.  Neurological: She is alert.  Skin: Skin is warm and dry.    Patient with induration of > 5 cm with tenderness beneath the areola of the left breast. There is some associated redness.  Patient has an irregular area of swelling > 5 cm in the left axilla without active drainage. No cellulitis.  Patient has an irregular area of swelling approximately 3 cm in diameter in the right axilla without active drainage. No synovitis.  Psychiatric: She has a normal mood and affect.  Nursing note and vitals reviewed.    ED Treatments / Results   Radiology [place] Chest Soft Tissue  Result Date: 07/02/2017 CLINICAL DATA:  Redness tenderness and swelling to the left breast below the nipple EXAM: ULTRASOUND OF HEAD/NECK SOFT TISSUES TECHNIQUE: Ultrasound examination of the head and neck soft tissues was performed in the area of clinical concern. COMPARISON:  Mammogram 06/21/2016 FINDINGS: Targeted sonography of the region  of concern is performed at the 6 o'clock position of left breast 1-2 cm from the nipple. At the 6 o'clock position, 1-2 cm from nipple is a large heterogenous hypoechoic mass measuring 4.9 x 2.2 x 5.7 cm. There appears to be some internal flow on Doppler imaging. Sonographer reports some motion of thick fluid in the mass during real-time sonography however this is not well demonstrated on the cine clips submitted. IMPRESSION: At the 6 o'clock position, 1-2 cm from nipple within the left breast is a heterogenous hypoechoic mass measuring 5.7 cm. This does contain some internal flow however sonographer reports some particle motion during real-time sonography. Findings could be secondary to breast abscess and associated confluent soft tissue edema, however solid breast mass is not excluded. Recommend that patient be started on appropriate course of antibiotics and referred to breast imaging center for possible abscess drainage and/or appropriate mammographic and ultrasound evaluation. Electronically Signed   By: Donavan Foil M.D.   On: 07/02/2017 19:37     Procedures Procedures (including critical care time)  Medications Ordered in ED Medications  sulfamethoxazole-trimethoprim (BACTRIM DS,SEPTRA DS) 800-160 MG per tablet 1 tablet (not administered)  oxyCODONE-acetaminophen (PERCOCET/ROXICET) 5-325 MG per tablet 2 tablet (2 tablets Oral Given 07/02/17 1827)     Initial Impression / Assessment and Plan / ED Course  I have reviewed the triage vital signs and the nursing notes.  Pertinent labs & imaging results that were available during my care of the patient were reviewed by me and considered in my medical decision making (see chart for details).     Patient seen and examined. Discussed patient with Dr. Laverta Baltimore who will see. Will obtain US.   Vital signs reviewed and are as follows: BP (!) 151/110 (BP Location: Right Arm)   Pulse 100   Temp 98.1 F (36.7 C) (Oral)   Resp 18   Wt (!) 136.5 kg (301 lb)   LMP 09/13/2011   SpO2 96%   BMI 50.09 kg/m   7:52 PM ultrasound findings as above. Dr. Laverta Baltimore has seen patient. Will start patient on Bactrim and give pain medication. She will call the breast center tomorrow to schedule an appointment to obtain follow-up.  The patient was urged to return to the Emergency Department urgently with worsening pain, swelling, expanding erythema especially if it streaks away from the affected area, fever, or if they have any other concerns.   The patient verbalized understanding and stated agreement with this plan.   Patient counseled on use of narcotic pain medications. Counseled not to combine these medications with others containing tylenol. Urged not to drink alcohol, drive, or perform any other activities that requires focus while taking these medications. The patient verbalizes understanding and agrees with the plan.   Final Clinical Impressions(s) / ED Diagnoses   Final diagnoses:  Swelling  Breast abscess  Abscess of left axilla  Abscess of axilla, right   Patient with left breast  swelling and pain, suspect abscess, however cannot rule out breast mass. She is referred to the breast center for further evaluation and treatment, per recommendations on ultrasound tonight.  Patient also with recurrent areas of soft tissue swelling in her bilateral axilla consistent with h/o hidradenitis. Nothing is amenable to drainage currently. Will continue symptomatic treatment.  New Prescriptions New Prescriptions   No medications on file     Carlisle Cater, Hershal Coria 07/02/17 1954    Long, Wonda Olds, MD 07/03/17 1409

## 2017-07-14 ENCOUNTER — Encounter: Payer: Self-pay | Admitting: Family

## 2017-07-14 ENCOUNTER — Telehealth: Payer: Self-pay | Admitting: Family

## 2017-07-14 ENCOUNTER — Other Ambulatory Visit: Payer: Self-pay | Admitting: Family

## 2017-07-14 DIAGNOSIS — Z1239 Encounter for other screening for malignant neoplasm of breast: Secondary | ICD-10-CM

## 2017-07-14 DIAGNOSIS — L0291 Cutaneous abscess, unspecified: Secondary | ICD-10-CM

## 2017-07-14 NOTE — Telephone Encounter (Signed)
Tonya at Breast center called to say the screening mammogram order has been changed to a diagnostic mammogram. Also they added a Left breast US as fu for abscess. Please [place] sign orders so they can call pt to schedule these imaging.

## 2017-07-14 NOTE — Telephone Encounter (Signed)
Orders signed.

## 2017-07-16 ENCOUNTER — Other Ambulatory Visit (HOSPITAL_COMMUNITY): Payer: Self-pay | Admitting: Radiology

## 2017-07-16 ENCOUNTER — Ambulatory Visit
Admission: RE | Admit: 2017-07-16 | Discharge: 2017-07-16 | Disposition: A | Discharge status: Home / Self Care | Payer: BLUE CROSS/BLUE SHIELD | Source: Ambulatory Visit | Attending: Family | Admitting: Family

## 2017-07-16 ENCOUNTER — Ambulatory Visit
Admission: RE | Admit: 2017-07-16 | Discharge: 2017-07-16 | Disposition: A | Discharge status: Home / Self Care | Payer: BLUE CROSS/BLUE SHIELD | Source: Ambulatory Visit | Attending: Cardiology | Admitting: Cardiology

## 2017-07-16 ENCOUNTER — Telehealth: Payer: Self-pay | Admitting: *Deleted

## 2017-07-16 ENCOUNTER — Other Ambulatory Visit: Payer: Self-pay | Admitting: Family

## 2017-07-16 DIAGNOSIS — L0291 Cutaneous abscess, unspecified: Secondary | ICD-10-CM

## 2017-07-16 DIAGNOSIS — R928 Other abnormal and inconclusive findings on diagnostic imaging of breast: Secondary | ICD-10-CM | POA: Diagnosis not present

## 2017-07-16 DIAGNOSIS — N611 Abscess of the breast and nipple: Secondary | ICD-10-CM

## 2017-07-16 DIAGNOSIS — N6489 Other specified disorders of breast: Secondary | ICD-10-CM | POA: Diagnosis not present

## 2017-07-16 NOTE — Telephone Encounter (Signed)
Received call from [facility] requesting verbal orders to proceed with aspiration of breast abscess. Per verbal from PCP, Inda Castle NP, ok to proceed. They will fax paper order to the nurses station to my attn.

## 2017-07-17 MED FILL — CEPHALEXIN 500 MG CAPSULE: 500 | 10 days supply | Qty: 40 | Fill #0

## 2017-07-28 ENCOUNTER — Ambulatory Visit
Admission: RE | Admit: 2017-07-28 | Discharge: 2017-07-28 | Disposition: A | Discharge status: Home / Self Care | Payer: BLUE CROSS/BLUE SHIELD | Source: Ambulatory Visit | Attending: Family | Admitting: Family

## 2017-07-28 DIAGNOSIS — L0291 Cutaneous abscess, unspecified: Secondary | ICD-10-CM

## 2017-07-28 DIAGNOSIS — N6323 Unspecified lump in the left breast, lower outer quadrant: Secondary | ICD-10-CM | POA: Diagnosis not present

## 2017-08-21 ENCOUNTER — Encounter: Payer: Self-pay | Admitting: Family

## 2017-08-25 MED ORDER — FLUCONAZOLE 150 MG PO TABS: 150.0000 mg | ORAL_TABLET | Freq: Once | ORAL | 0 refills | Status: AC

## 2017-08-25 MED FILL — FLUCONAZOLE 150 MG TABLET: 150 | 1 days supply | Qty: 1 | Fill #0

## 2017-10-14 ENCOUNTER — Emergency Department (HOSPITAL_BASED_OUTPATIENT_CLINIC_OR_DEPARTMENT_OTHER)
Admission: EM | Admit: 2017-10-14 | Discharge: 2017-10-14 | Disposition: A | Discharge status: Home / Self Care | Payer: BLUE CROSS/BLUE SHIELD | Attending: Emergency Medicine | Admitting: Emergency Medicine

## 2017-10-14 ENCOUNTER — Encounter (HOSPITAL_BASED_OUTPATIENT_CLINIC_OR_DEPARTMENT_OTHER): Payer: Self-pay | Admitting: Emergency Medicine

## 2017-10-14 ENCOUNTER — Other Ambulatory Visit: Payer: Self-pay

## 2017-10-14 DIAGNOSIS — L0291 Cutaneous abscess, unspecified: Secondary | ICD-10-CM

## 2017-10-14 DIAGNOSIS — F17210 Nicotine dependence, cigarettes, uncomplicated: Secondary | ICD-10-CM | POA: Insufficient documentation

## 2017-10-14 DIAGNOSIS — I10 Essential (primary) hypertension: Secondary | ICD-10-CM | POA: Insufficient documentation

## 2017-10-14 DIAGNOSIS — Z794 Long term (current) use of insulin: Secondary | ICD-10-CM | POA: Insufficient documentation

## 2017-10-14 DIAGNOSIS — L02219 Cutaneous abscess of trunk, unspecified: Secondary | ICD-10-CM | POA: Diagnosis not present

## 2017-10-14 DIAGNOSIS — Z79899 Other long term (current) drug therapy: Secondary | ICD-10-CM | POA: Insufficient documentation

## 2017-10-14 DIAGNOSIS — E785 Hyperlipidemia, unspecified: Secondary | ICD-10-CM | POA: Insufficient documentation

## 2017-10-14 DIAGNOSIS — L02211 Cutaneous abscess of abdominal wall: Secondary | ICD-10-CM | POA: Diagnosis not present

## 2017-10-14 DIAGNOSIS — E119 Type 2 diabetes mellitus without complications: Secondary | ICD-10-CM | POA: Diagnosis not present

## 2017-10-14 LAB — CBC
HCT: 45.9 % (ref 36.0–46.0)
HEMOGLOBIN: 15.6 g/dL — AB (ref 12.0–15.0)
MCH: 29.4 pg (ref 26.0–34.0)
MCHC: 34 g/dL (ref 30.0–36.0)
MCV: 86.4 fL (ref 78.0–100.0)
PLATELETS: 238 10*3/uL (ref 150–400)
RBC: 5.31 MIL/uL — AB (ref 3.87–5.11)
RDW: 13 % (ref 11.5–15.5)
WBC: 8 10*3/uL (ref 4.0–10.5)

## 2017-10-14 LAB — CBG MONITORING, ED: GLUCOSE-CAPILLARY: 420 mg/dL — AB (ref 65–99)

## 2017-10-14 LAB — BASIC METABOLIC PANEL
ANION GAP: 9 (ref 5–15)
BUN: 13 mg/dL (ref 6–20)
CALCIUM: 9.6 mg/dL (ref 8.9–10.3)
CO2: 27 mmol/L (ref 22–32)
CREATININE: 0.77 mg/dL (ref 0.44–1.00)
Chloride: 94 mmol/L — ABNORMAL LOW (ref 101–111)
GFR calc non Af Amer: >60 mL/min (ref >60)
Glucose, Bld: 414 mg/dL — ABNORMAL HIGH (ref 65–99)
Potassium: 4.3 mmol/L (ref 3.5–5.1)
SODIUM: 130 mmol/L — AB (ref 135–145)

## 2017-10-14 MED ORDER — MORPHINE SULFATE (PF) 4 MG/ML IV SOLN
4.0000 mg | Freq: Once | INTRAVENOUS | Status: AC
  Administered 2017-10-14: 4 mg via INTRAVENOUS
  Filled 2017-10-14: qty 1

## 2017-10-14 MED ORDER — CLINDAMYCIN HCL 300 MG PO CAPS: 300.0000 mg | ORAL_CAPSULE | Freq: Three times a day (TID) | ORAL | 0 refills | Status: DC

## 2017-10-14 MED ORDER — HYDROCODONE-ACETAMINOPHEN 5-325 MG PO TABS: 1.0000 | ORAL_TABLET | Freq: Four times a day (QID) | ORAL | 0 refills | Status: DC | PRN

## 2017-10-14 MED ORDER — CLINDAMYCIN PHOSPHATE 600 MG/50ML IV SOLN
600.0000 mg | Freq: Once | INTRAVENOUS | Status: AC
  Administered 2017-10-14: 600 mg via INTRAVENOUS
  Filled 2017-10-14: qty 50

## 2017-10-14 MED ORDER — SODIUM CHLORIDE 0.9 % IV BOLUS (SEPSIS)
1000.0000 mL | Freq: Once | INTRAVENOUS | Status: AC
  Administered 2017-10-14: 1000 mL via INTRAVENOUS

## 2017-10-14 MED ORDER — ONDANSETRON HCL 4 MG/2ML IJ SOLN
4.0000 mg | Freq: Once | INTRAMUSCULAR | Status: AC
  Administered 2017-10-14: 4 mg via INTRAVENOUS
  Filled 2017-10-14: qty 2

## 2017-10-14 MED FILL — HYDROCODON-APAP 5-325: 5-325 | 2 days supply | Qty: 4 | Fill #0

## 2017-10-14 MED FILL — CLINDAMYCIN HCL 300 MG CAPS: 300 | 6 days supply | Qty: 18 | Fill #0

## 2017-10-14 NOTE — ED Provider Notes (Signed)
[facility] EMERGENCY DEPARTMENT Provider Note   CSN: [id number] Arrival date & time: 10/14/17  1328     History   Chief Complaint Chief Complaint  Patient presents with  . Abscess    HPI Catherine Espinoza is a 42 y.o. female.  HPI 42 year old African-American female with history of hidradenitis, recurrent abscesses, diabetes, obesity, sleep apnea that presents to the ED with recurrent abdominal wall abscess.  Patient states that she has history of hidradenitis and has recurrent abscesses all over.  Patient states that she has had this drained several times.  States that for the past 2-3 days she has had this worsening redness and pain to the right anterior abdomen.  She states that she does have one the left side that spontaneously drained look just like this.  She states when it is not draining.  Patient states that this is the area that she has had to be have drained several times in the ED.  The patient states that she is been going to the gym and wearing a sweat band which she thinks made it worse.  Denies any associated abdominal pain, nausea, vomiting, fevers, chills, change in bowel habits, urinary symptoms.  Nothing makes better or worse.  She is not taking for the pain prior to arrival.  Patient states that she did take 3 doses of Keflex 3 days ago that she had leftover from a prior abscess.  Blood sugars are not very well controlled.  She is followed by primary care.  She is on insulin. Past Medical History:  Diagnosis Date  . Anemia, unspecified   . Excessive or frequent menstruation   . Morbid obesity ([place])   . Obstructive sleep apnea 02/11/11   Sleep study: severe OSA- rec CPAP 20cm small  full face mask  . Proteinuria   . Type II or unspecified type diabetes mellitus without mention of complication, not stated as uncontrolled   . Unspecified essential hypertension     Patient Active Problem List   Diagnosis Date Noted  . Preventative health care 06/21/2016  .  GERD (gastroesophageal reflux disease) 12/25/2014  . Onychomycosis 08/11/2014  . HTN (hypertension) 10/11/2013  . Hidradenitis suppurativa 08/02/2011  . Fatigue 08/02/2011  . Obstructive sleep apnea 02/15/2011  . Hypertension 02/15/2011  . Hyperlipidemia 01/16/2011  . PROTEINURIA 11/16/2009  . Diabetes type 2, uncontrolled ([place]) 11/03/2009  . ANEMIA 11/03/2009  . MORBID OBESITY 10/31/2009  . TOBACCO ABUSE 10/31/2009    Past Surgical History:  Procedure Laterality Date  . ABDOMINAL HYSTERECTOMY    . ABLATION COLPOCLESIS    . CESAREAN SECTION    . CLEFT PALATE REPAIR    . cyst removal    . ECTOPIC PREGNANCY SURGERY     x 2  . OOPHORECTOMY Right 2002    OB History    No data available       Home Medications    Prior to Admission medications   Medication Sig Start Date End Date Taking? Authorizing Provider  albuterol (PROVENTIL HFA;VENTOLIN HFA) 108 (90 Base) MCG/ACT inhaler Inhale 2 puffs into the lungs every 6 (six) hours as needed for wheezing or shortness of breath. 10/01/16   [name], NP  amLODipine (NORVASC) 10 MG tablet Take 1 tablet (10 mg total) by mouth daily. 11/08/16   [name], NP  atorvastatin (LIPITOR) 40 MG tablet TAKE 1 TABLET (40 MG TOTAL) BY MOUTH DAILY. 02/11/17   [name], NP  glucose blood (BAYER CONTOUR TEST) test strip Use to  check blood sugar 2 times per day. 07/26/16   Renato Shin, MD  Insulin Glargine (LANTUS SOLOSTAR) 100 UNIT/ML Solostar Pen Inject 20 Units into the skin daily. 07/29/16   Renato Shin, MD  Insulin Pen Needle (BD ULTRA-FINE PEN NEEDLES) 29G X 12.7MM MISC Use with lantus solostar for insulin injections. 10/07/16   [name], NP  INSULIN SYRINGE 1CC/29G ([place] 1CC/29G) 29G X 1/2" 1 ML MISC Use for insulin injection once a day. 08/14/12   [name], NP  JANUVIA 100 MG tablet Take 1 tablet (100 mg total) by mouth daily. 11/08/16   [name], NP  Lancets (ACCU-CHEK  MULTICLIX) lancets Use as instructed to check blood sugar twice a day.     [provider]  lisinopril (PRINIVIL,ZESTRIL) 20 MG tablet Take 1 tablet (20 mg total) by mouth daily. 08/19/16   [name], NP  LORazepam (ATIVAN) 0.5 MG tablet One tablet by mouth prior to flight 01/13/17   [name], NP  omeprazole (PRILOSEC) 40 MG capsule TAKE 1 CAPSULE (40 MG TOTAL) BY MOUTH DAILY. 02/11/17   [name], NP  oxyCODONE-acetaminophen (PERCOCET/ROXICET) 5-325 MG tablet Take 1-2 tablets by mouth every 6 (six) hours as needed for severe pain. 07/02/17   Carlisle Cater, PA-C    Family History Family History  Problem Relation Age of Onset  . Heart attack Maternal Aunt   . Diabetes Mother   . Cancer Father        oral cancer    Social History Social History   Tobacco Use  . Smoking status: Current Every Day Smoker    Packs/day: 0.50    Years: 10.00    Pack years: 5.00    Types: Cigarettes  . Smokeless tobacco: Never Used  Substance Use Topics  . Alcohol use: Yes    Alcohol/week: 0.0 oz    Comment: occasional use  . Drug use: No     Allergies   Ibuprofen; Labetalol; and Aspirin   Review of Systems Review of Systems  Constitutional: Negative for chills and fever.  Gastrointestinal: Negative for abdominal pain, diarrhea and nausea.  Genitourinary: Negative for dysuria, hematuria and urgency.  Skin: Positive for wound.     Physical Exam Updated Vital Signs BP (!) 138/96 (BP Location: Left Arm)   Pulse 97   Temp 98.7 F (37.1 C) (Oral)   Resp 18   Ht 5\' 6"  (1.676 m)   Wt 131.5 kg (290 lb)   LMP 09/13/2011   SpO2 99%   BMI 46.81 kg/m   Physical Exam  Constitutional: She appears well-developed and well-nourished. No distress.  HENT:  Head: Normocephalic and atraumatic.  Eyes: Right eye exhibits no discharge. Left eye exhibits no discharge. No scleral icterus.  Neck: Normal range of motion.  Pulmonary/Chest: No respiratory distress.    Abdominal: Soft. Bowel sounds are normal. She exhibits no distension. There is no tenderness. There is no rigidity, no rebound, no guarding, no CVA tenderness, no tenderness at McBurney's point and negative Murphy's sign.  Patient has approximately 5 x 5 cm area of erythema induration to the right anterior abdomen wall.  She has has a 2 x 2 centimeter area of fluctuance.  No drainage noted.  Tender to palpation.  She has several prior scars from prior I&D's of the anterior abdominal wall.  Obese abdomen noted.  Musculoskeletal: Normal range of motion.  Neurological: She is alert.  Skin: Skin is warm and dry. No pallor.  Psychiatric: Her behavior is normal. Judgment  and thought content normal.  Nursing note and vitals reviewed.        ED Treatments / Results  Labs (all labs ordered are listed, but only abnormal results are displayed) Labs Reviewed  BASIC METABOLIC PANEL - Abnormal; Notable for the following components:      Result Value   Sodium 130 (*)    Chloride 94 (*)    Glucose, Bld 414 (*)    All other components within normal limits  CBC - Abnormal; Notable for the following components:   RBC 5.31 (*)    Hemoglobin 15.6 (*)    All other components within normal limits  CBG MONITORING, ED - Abnormal; Notable for the following components:   Glucose-Capillary 420 (*)    All other components within normal limits    EKG  EKG Interpretation  Date/Time:  Tuesday October 14 2017 13:44:00 EST Ventricular Rate:  119 PR Interval:    QRS Duration: 90 QT Interval:  314 QTC Calculation: 442 R Axis:   109 Text Interpretation:  Sinus tachycardia Right axis deviation Borderline T abnormalities, anterior leads previous inferior T wave changes have normalized Confirmed by Theotis Burrow [phone]) on 10/14/2017 2:16:18 PM       Radiology No results found.  Procedures .Marland KitchenIncision and Drainage Date/Time: 10/14/2017 4:57 PM Performed by: Doristine Devoid, PA-C Authorized by:  Doristine Devoid, PA-C   Consent:    Consent obtained:  Verbal   Consent given by:  Patient   Risks discussed:  Bleeding, damage to other organs, infection, incomplete drainage and pain   Alternatives discussed:  No treatment Location:    Type:  Abscess   Size:  4 cm   Location:  Trunk   Trunk location:  Abdomen Pre-procedure details:    Skin preparation:  Betadine Anesthesia (see MAR for exact dosages):    Anesthesia method:  Local infiltration   Local anesthetic:  Lidocaine 1% WITH epi Procedure type:    Complexity:  Simple Procedure details:    Needle aspiration: no     Incision types:  Single straight   Incision depth:  Dermal   Scalpel blade:  11   Wound management:  Probed and deloculated and irrigated with saline   Drainage:  Bloody and purulent   Drainage amount:  Copious   Wound treatment:  Wound left open   Packing materials:  1/4 in iodoform gauze   Amount 1/4" iodoform:  3 Post-procedure details:    Patient tolerance of procedure:  Tolerated well, no immediate complications   (including critical care time)  Medications Ordered in ED Medications  clindamycin (CLEOCIN) IVPB 600 mg (600 mg Intravenous New Bag/Given 10/14/17 1624)  sodium chloride 0.9 % bolus 1,000 mL (1,000 mLs Intravenous New Bag/Given 10/14/17 1543)  sodium chloride 0.9 % bolus 1,000 mL (1,000 mLs Intravenous New Bag/Given 10/14/17 1543)  morphine 4 MG/ML injection 4 mg (4 mg Intravenous Given 10/14/17 1622)  ondansetron (ZOFRAN) injection 4 mg (4 mg Intravenous Given 10/14/17 1624)     Initial Impression / Assessment and Plan / ED Course  I have reviewed the triage vital signs and the nursing notes.  Pertinent labs & imaging results that were available during my care of the patient were reviewed by me and considered in my medical decision making (see chart for details).     Patient presents to the ED with complaints of recurrent abdominal wall abscess.  Patient history of hidradenitis had  to have her abscesses drained several times in the ED.  The patient denies any associated systemic symptoms of fever, chills, nausea, vomiting.  Patient is initially tachycardic however on review patient remains tachycardic on ER visits.  Patient states that her heart rate is usually high.  She is mildly hypertensive but no hypotension is noted.  Patient is satting at 91% when she is lying flat but states that she does have sleep apnea and that this is normal for her.  Patient is very obese.  When patient is sitting upright her oxygen saturations are normal.  On exam patient does have area of induration and fluctuance of the right anterior abdomen.  No purulent drainage.  Mildly tender to palpation.  Patient has no signs of peritonitis or focal abdominal tenderness.  Blood work was obtained that reveals a glucose of 414.  Patient states that her blood sugars have been running high and they are hard to control at baseline.  The patient has normal bicarb and anion gap.  Low suspicion for DKA.  CBC reveals no leukocytosis.  Patient seems mildly hemoconcentrated.  Bedside ultrasound was used to assess for any focal abscess.  I did reveal a 2 x 2 centimeter abscess of the anterior abdomen wall.  I&D was performed with good irrigation and packing.  Significant amount of purulent drainage was expressed.  Patient had improvement of pain after I&D.  Fluids.  Given that patient has no signs of DKA do not feel that further intervention for hyperglycemia is indicated at this time.  Patient's heart rate has improved with fluids.  Likely secondary to pain versus dehydration.  Again patient is watching saturations have been low in the ED when she is laying flat however when she is sitting up the unremarkable.  She states that she does wear CPAP at home.  Patient denies any chest pain or shortness of breath or be concerning for PE or ACS.  She is not meeting sepsis or sirs criteria.  Given that she is afebrile without  any leukocytosis in the ED do not feel that imaging was indicated at this time.  Patient states that this is typical for her abscess that he has had drained several times.  It does not different from her baseline of recurrent abscesses.  I&D performed.  Will give dose of clindamycin and start patient on clindamycin at home.  Patient to return to the ED in 2 days for follow-up.  Have discussed very strict return precautions per patient.  Have instructed patient to continue insulin at home and monitor her blood sugars very closely.  Close follow-up with her PCP.  Pt is hemodynamically stable, in NAD, & able to ambulate in the ED. Evaluation does not show pathology that would require ongoing emergent intervention or inpatient treatment. I explained the diagnosis to the patient. Pain has been managed & has no complaints prior to dc. Pt is comfortable with above plan and is stable for discharge at this time. All questions were answered prior to disposition. Strict return precautions for f/u to the ED were discussed. Encouraged follow up with PCP.   Pt was dicussed with Dr. Rex Kras who is agreeable with the above plan.  Final Clinical Impressions(s) / ED Diagnoses   Final diagnoses:  Abscess    ED Discharge Orders        Ordered    clindamycin (CLEOCIN) 300 MG capsule  3 times daily     10/14/17 1703    HYDROcodone-acetaminophen (NORCO) 5-325 MG tablet  Every 6 hours PRN     10/14/17 1703  Doristine Devoid, PA-C 10/14/17 1730    Little, Wenda [place], MD 10/15/17 [phone]

## 2017-10-14 NOTE — ED Triage Notes (Signed)
Patient states that she has had a boil to her right abdominal region x 2 -3 days

## 2017-10-14 NOTE — ED Notes (Signed)
CBG is 420

## 2017-10-14 NOTE — Discharge Instructions (Signed)
You have been treated for an abscess in the ED.  ° °There are sign of surrounding infection. Please take all of your antibiotics until finished!   You may develop abdominal discomfort or diarrhea from the antibiotic. You may help offset this with probiotics which you can buy or get in yogurt.  ° °May take tylenol and motrin as needed for pain. Warm compress to the area to help with the healing process. Avoid swimming for 1 week. May soak in warm water to help with pain and the healing process.  ° °Follow up with your doctor, an urgent care, or return to ED in order to remove your packing in 48-72 hours. If you do not have packing return in 48-72 hours for wound recheck. Return to the emergency department if you develop a fever, your abscess appears to become more infected (growing surrounding redness and warmth), new or worsening symptoms develop, any additional concerns.  ° °Abscess °An abscess (boil or furuncle) is an infected area that contains a collection of pus.  ° °SYMPTOMS °Signs and symptoms of an abscess include pain, tenderness, redness, or hardness. You may feel a moveable soft area under your skin. An abscess can occur anywhere in the body.  ° °TREATMENT  °A surgical cut (incision) may be made over your abscess to drain the pus. Gauze may be packed into the space or a drain may be looped through the abscess cavity (pocket). This provides a drain that will allow the cavity to heal from the inside outwards. The abscess may be painful for a few days, but should feel much better if it was drained.  °Your abscess, if seen early, may not have localized and may not have been drained. If not, another appointment may be required if it does not get better on its own or with medications. ° °HOME CARE INSTRUCTIONS  °Keep the skin and clothes clean around your abscess.  °If the abscess was drained, you will need to use gauze dressing to collect any draining pus. Dressings will typically need to be changed 3 or more  times a day.  °The infection may spread by skin contact with others. Avoid skin contact as much as possible.  °Practice good hygiene. This includes regular hand washing, cover any draining skin lesions, and do not share personal care items.  °SEEK MEDICAL CARE IF:  °You develop increased pain, swelling, redness, drainage, or bleeding in the wound site.  °You develop signs of generalized infection including muscle aches, chills, fever, or a general ill feeling.  °You have an oral temperature above 102° F (38.9° C).  °MAKE SURE YOU:  °Understand these instructions.  °Will watch your condition.  °Will get help right away if you are not doing well or get worse.  °Document Released: 06/26/2005 Document Revised: 05/29/2011 Document Reviewed: 04/19/2008 °[employer]® Patient Information ©2012 [employer]. ° ° ° °

## 2017-10-16 ENCOUNTER — Other Ambulatory Visit: Payer: Self-pay | Admitting: Family

## 2017-10-17 ENCOUNTER — Telehealth: Payer: Self-pay

## 2017-10-17 NOTE — Telephone Encounter (Signed)
Please contact pt to schedule a follow up visit.  

## 2017-10-17 NOTE — Telephone Encounter (Signed)
Called pt and LVM to call back and schedule a follow up visit.

## 2017-10-17 NOTE — Telephone Encounter (Signed)
PA initiated via Covermymeds; KEY: [id number]. Awaiting determination.

## 2017-10-24 ENCOUNTER — Encounter: Payer: Self-pay | Admitting: Family

## 2017-10-24 ENCOUNTER — Ambulatory Visit (INDEPENDENT_AMBULATORY_CARE_PROVIDER_SITE_OTHER): Payer: BLUE CROSS/BLUE SHIELD | Admitting: Family

## 2017-10-24 VITALS — BP 151/99 | HR 91 | Temp 98.6°F | Resp 16 | Ht 65.0 in | Wt 294.0 lb

## 2017-10-24 DIAGNOSIS — Z0000 Encounter for general adult medical examination without abnormal findings: Secondary | ICD-10-CM | POA: Diagnosis not present

## 2017-10-24 DIAGNOSIS — Z23 Encounter for immunization: Secondary | ICD-10-CM

## 2017-10-24 DIAGNOSIS — G4733 Obstructive sleep apnea (adult) (pediatric): Secondary | ICD-10-CM

## 2017-10-24 DIAGNOSIS — E1165 Type 2 diabetes mellitus with hyperglycemia: Secondary | ICD-10-CM | POA: Diagnosis not present

## 2017-10-24 DIAGNOSIS — L02211 Cutaneous abscess of abdominal wall: Secondary | ICD-10-CM | POA: Diagnosis not present

## 2017-10-24 LAB — URINALYSIS, ROUTINE W REFLEX MICROSCOPIC
BILIRUBIN URINE: NEGATIVE
Ketones, ur: NEGATIVE
Leukocytes, UA: NEGATIVE
Nitrite: NEGATIVE
PH: 6 (ref 5.0–8.0)
SPECIFIC GRAVITY, URINE: 1.025 (ref 1.000–1.030)
TOTAL PROTEIN, URINE-UPE24: 30 — AB
UROBILINOGEN UA: 0.2 (ref 0.0–1.0)
Urine Glucose: >=1000 — AB
WBC, UA: NONE SEEN (ref 0–?)

## 2017-10-24 LAB — BASIC METABOLIC PANEL
BUN: 10 mg/dL (ref 6–23)
CHLORIDE: 95 meq/L — AB (ref 96–112)
CO2: 31 meq/L (ref 19–32)
Calcium: 9.5 mg/dL (ref 8.4–10.5)
Creatinine, Ser: 0.57 mg/dL (ref 0.40–1.20)
GFR: 149.77 mL/min (ref >60.00)
Glucose, Bld: 309 mg/dL — ABNORMAL HIGH (ref 70–99)
POTASSIUM: 4.1 meq/L (ref 3.5–5.1)
SODIUM: 133 meq/L — AB (ref 135–145)

## 2017-10-24 LAB — LIPID PANEL
CHOL/HDL RATIO: 5
Cholesterol: 195 mg/dL (ref 0–200)
HDL: 41.1 mg/dL (ref >39.00)
LDL Cholesterol: 134 mg/dL — ABNORMAL HIGH (ref 0–99)
NONHDL: 153.87
Triglycerides: 100 mg/dL (ref 0.0–149.0)
VLDL: 20 mg/dL (ref 0.0–40.0)

## 2017-10-24 LAB — TSH: TSH: 0.69 u[IU]/mL (ref 0.35–4.50)

## 2017-10-24 LAB — HEPATIC FUNCTION PANEL
ALBUMIN: 3.6 g/dL (ref 3.5–5.2)
ALK PHOS: 71 U/L (ref 39–117)
ALT: 21 U/L (ref 0–35)
AST: 12 U/L (ref 0–37)
BILIRUBIN DIRECT: 0.1 mg/dL (ref 0.0–0.3)
TOTAL PROTEIN: 7.6 g/dL (ref 6.0–8.3)
Total Bilirubin: 0.4 mg/dL (ref 0.2–1.2)

## 2017-10-24 LAB — HEMOGLOBIN A1C: Hgb A1c MFr Bld: 13.7 % — ABNORMAL HIGH (ref 4.6–6.5)

## 2017-10-24 MED ORDER — INSULIN LISPRO 100 UNIT/ML (KWIKPEN): PEN_INJECTOR | SUBCUTANEOUS | 11 refills | Status: DC

## 2017-10-24 MED ORDER — BAYER MICROLET LANCETS MISC: 5 refills | Status: DC

## 2017-10-24 MED ORDER — GLUCOSE BLOOD VI STRP: ORAL_STRIP | 5 refills | Status: DC

## 2017-10-24 MED FILL — FREESTYLE LANCETS: 30 days supply | Qty: 100 | Fill #0

## 2017-10-24 MED FILL — FREESTYLE LITE METER: 30 days supply | Qty: 1 | Fill #0

## 2017-10-24 MED FILL — FREESTYLE LITE TEST STRIP: 17 days supply | Qty: 50 | Fill #0

## 2017-10-24 MED FILL — HUMALOG 100 UNITS/ML KWIKPE: 100 | 25 days supply | Qty: 9 | Fill #0

## 2017-10-24 NOTE — Telephone Encounter (Signed)
Pt seen today and Humalog Rx was sent to replace novolog.

## 2017-10-24 NOTE — Telephone Encounter (Signed)
Copied from [place] [id number]. Topic: General - Other >> Oct 23, 2017  9:54 AM Boyd Kerbs wrote: Reason for CRM:  Lillard Anes [phone] Ext [phone]  Has faxed information and called regarding questions on PA.   Asking for a call back   >> Oct 23, 2017  9:59 AM Damita Dunnings, CMA wrote: This is the PA regarding the Novolog- form questions were forwarded to [place]. Will forward CRM to her for when she returns.

## 2017-10-24 NOTE — Patient Instructions (Addendum)
Please schedule a follow up eye exam. Complete lab work prior to leaving.  Continue to work on [employer], exercise and weight loss.

## 2017-10-24 NOTE — Progress Notes (Signed)
Subjective:    Patient ID: Catherine Espinoza, female    DOB: [birth year], 42 y.o.   MRN: [id number]  HPI   Patient presents today for complete physical.  Immunizations: tdap 2017, flu shot today Diet: reports that she is working on improving her diet. Stopped sodas, drinking crystal light instead Exercise: started working out Pap Smear: hysterectomy Mammogram: 10/18 Vision: due for follow Dental: up to date  Has lost weight.  Goes home and goes to sleep.  Joined the GYM 1 month ago.    Wt Readings from Last 3 Encounters:  10/24/17 294 lb (133.4 kg)  10/14/17 290 lb (131.5 kg)  07/02/17 (!) 301 lb (136.5 kg)   OSA- not using cpap.     Home sugars are ranging 100-300's.       Assessment & Plan:    Abdominal wall abscess-the patient was seen in the emergency department on January 15.  She underwent incision and drainage on January 15.  Diabetes type 2- Insurance no longer covering novolog but will cover humalog.  Lab Results  Component Value Date   HGBA1C 10.2 (H) 01/13/2017  She was following with Dr. Loanne Drilling back in 2017 but has not seen him since that time.  Review of Systems  Constitutional: Negative for unexpected weight change.  HENT: Negative for rhinorrhea.   Eyes: Negative for visual disturbance.  Respiratory: Negative for cough and shortness of breath.   Cardiovascular: Negative for chest pain.  Gastrointestinal: Negative for nausea and vomiting.  Genitourinary: Negative for dysuria and frequency.  Musculoskeletal: Negative for arthralgias.  Skin:       Intermittent boils.   Neurological: Negative for headaches.  Hematological: Negative for adenopathy.  Psychiatric/Behavioral:       Denies anxiety      see HPI  Past Medical History:  Diagnosis Date  . Anemia, unspecified   . Excessive or frequent menstruation   . Morbid obesity ([place])   . Obstructive sleep apnea 02/11/11   Sleep study: severe OSA- rec CPAP 20cm small  full face mask  . Proteinuria     . Type II or unspecified type diabetes mellitus without mention of complication, not stated as uncontrolled   . Unspecified essential hypertension      Social History   Socioeconomic History  . Marital status: Single    Spouse name: Not on file  . Number of children: Not on file  . Years of education: Not on file  . Highest education level: Not on file  Social Needs  . Financial resource strain: Not on file  . Food insecurity - worry: Not on file  . Food insecurity - inability: Not on file  . Transportation needs - medical: Not on file  . Transportation needs - non-medical: Not on file  Occupational History  . Occupation: cna/med Engineer, production: [employer]  Tobacco Use  . Smoking status: Current Every Day Smoker    Packs/day: 0.50    Years: 10.00    Pack years: 5.00    Types: Cigarettes  . Smokeless tobacco: Never Used  Substance and Sexual Activity  . Alcohol use: Yes    Alcohol/week: 0.0 oz    Comment: occasional use  . Drug use: No  . Sexual activity: Yes    Birth control/protection: Surgical  Other Topics Concern  . Not on file  Social History Narrative   Gerome Sam for divorced   66 year old daughter   Works as Industrial/product designer    Past Surgical  History:  Procedure Laterality Date  . ABDOMINAL HYSTERECTOMY    . ABLATION COLPOCLESIS    . CESAREAN SECTION    . CLEFT PALATE REPAIR    . cyst removal    . ECTOPIC PREGNANCY SURGERY     x 2  . OOPHORECTOMY Right 2002    Family History  Problem Relation Age of Onset  . Heart attack Maternal Aunt   . Diabetes Mother   . Cancer Father        oral cancer    Allergies  Allergen Reactions  . Ibuprofen     REACTION: knots in mouth with SOB  . Labetalol Nausea Only  . Aspirin     Knots in mouth     Current Outpatient Medications on File Prior to Visit  Medication Sig Dispense Refill  . albuterol (PROVENTIL HFA;VENTOLIN HFA) 108 (90 Base) MCG/ACT inhaler Inhale 2 puffs into the lungs every 6  (six) hours as needed for wheezing or shortness of breath. 1 Inhaler 0  . amLODipine (NORVASC) 10 MG tablet Take 1 tablet (10 mg total) by mouth daily. 30 tablet 2  . atorvastatin (LIPITOR) 40 MG tablet TAKE 1 TABLET (40 MG TOTAL) BY MOUTH DAILY. 30 tablet 5  . glucose blood (BAYER CONTOUR TEST) test strip Use to check blood sugar 2 times per day. 100 each 4  . Insulin Glargine (LANTUS SOLOSTAR) 100 UNIT/ML Solostar Pen Inject 20 Units into the skin daily. 15 mL 5  . Insulin Pen Needle (BD ULTRA-FINE PEN NEEDLES) 29G X 12.7MM MISC Use with lantus solostar for insulin injections. 100 each 1  . INSULIN SYRINGE 1CC/29G (SUNMARK INSULIN SYR 1CC/29G) 29G X 1/2" 1 ML MISC Use for insulin injection once a day. 100 each 2  . JANUVIA 100 MG tablet Take 1 tablet (100 mg total) by mouth daily. 30 tablet 3  . Lancets (ACCU-CHEK MULTICLIX) lancets Use as instructed to check blood sugar twice a day.     . lisinopril (PRINIVIL,ZESTRIL) 20 MG tablet Take 1 tablet (20 mg total) by mouth daily. 90 tablet 1  . omeprazole (PRILOSEC) 40 MG capsule TAKE 1 CAPSULE (40 MG TOTAL) BY MOUTH DAILY. 30 capsule 5   No current facility-administered medications on file prior to visit.     BP (!) 151/99 (BP Location: Left Arm, Patient Position: Sitting, Cuff Size: Large)   Pulse 91   Temp 98.6 F (37 C) (Oral)   Resp 16   Ht 5\' 5"  (1.651 m)   Wt 294 lb (133.4 kg)   LMP 09/13/2011   SpO2 95%   BMI 48.92 kg/m    Objective:   Physical Exam  Physical Exam  Constitutional: Morbidly obese AA female. She is oriented to person, place, and time. She appears well-developed and well-nourished. No distress.  HENT:  Head: Normocephalic and atraumatic.  Right Ear: Tympanic membrane and ear canal normal.  Left Ear: Tympanic membrane and ear canal normal.  Mouth/Throat: Oropharynx is clear and moist.  Eyes: Pupils are equal, round, and reactive to light. No scleral icterus.  Neck: Normal range of motion. No thyromegaly  present.  Cardiovascular: Normal rate and regular rhythm.   No murmur heard. Pulmonary/Chest: Effort normal and breath sounds normal. No respiratory distress. He has no wheezes. She has no rales. She exhibits no tenderness.  Abdominal: Soft. Bowel sounds are normal. She exhibits no distension and no mass. There is no tenderness. There is no rebound and no guarding.  Musculoskeletal: She exhibits no edema.  Lymphadenopathy:  She has no cervical adenopathy.  Neurological: She is alert and oriented to person, place, and time. She has normal patellar reflexes. She exhibits normal muscle tone. Coordination normal.  Skin: Skin is warm and dry. some induration right lower abdomen without fluctuance or erythema Psychiatric: She has a normal mood and affect. Her behavior is normal. Judgment and thought content normal.  Breasts: Examined lying Right: Without masses, retractions, discharge or axillary adenopathy.  Left: Without masses, retractions, discharge or axillary adenopathy. 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 Mammogram up to date.  Advised pt to schedule vision screen. Flu shot today.   DM2- uncontrolled. Discussed importance if diet compliance and insulin/med compliance. Novolog changed to humalog for insurance purposes.   OSA- urged compliance with cpap. Will also refer back to Dr. Elsworth Soho.  Skin abscess- resolved. Advised pt that sugar control is very important to prevent further abscesses.      Assessment & Plan:

## 2017-10-24 NOTE — Telephone Encounter (Signed)
Plan will cover humalog or humulin. I did not see this medication in pt's past history. Pt has appt with [place] today.  Please advise?

## 2017-10-27 ENCOUNTER — Other Ambulatory Visit: Payer: Self-pay | Admitting: Family

## 2017-10-28 NOTE — Telephone Encounter (Signed)
Copied from [place] [id number]. Topic: General - Other >> Oct 28, 2017 11:21 AM Lolita Rieger, RMA wrote: Reason for CRM: Sunday Spillers would like a call back to clarify what insulin the pt is using please call [phone]

## 2017-10-28 NOTE — Telephone Encounter (Signed)
Catherine Espinoza is calling back in regards to this.   [phone] ext [phone]

## 2017-10-29 NOTE — Telephone Encounter (Signed)
Called number below. They are with Fleeta Emmer, for prior auth that was started on Novolog.  See 10/17/17 phone note.  Left detailed message that pt has been changed to Humalog and to call if further questions.

## 2017-11-27 ENCOUNTER — Institutional Professional Consult (permissible substitution): Payer: BLUE CROSS/BLUE SHIELD | Admitting: Pulmonary Disease

## 2017-12-04 ENCOUNTER — Encounter: Payer: Self-pay | Admitting: Family

## 2017-12-25 ENCOUNTER — Institutional Professional Consult (permissible substitution): Payer: BLUE CROSS/BLUE SHIELD | Admitting: Pulmonary Disease

## 2018-01-05 ENCOUNTER — Encounter: Payer: Self-pay | Admitting: Family

## 2018-01-05 ENCOUNTER — Telehealth: Payer: Self-pay

## 2018-01-05 ENCOUNTER — Encounter: Payer: Self-pay | Admitting: Endocrinology

## 2018-01-05 ENCOUNTER — Ambulatory Visit: Payer: BLUE CROSS/BLUE SHIELD | Admitting: Family

## 2018-01-05 ENCOUNTER — Ambulatory Visit: Payer: BLUE CROSS/BLUE SHIELD | Admitting: Endocrinology

## 2018-01-05 VITALS — BP 114/85 | HR 101 | Temp 98.9°F | Resp 18 | Ht 65.0 in | Wt 284.4 lb

## 2018-01-05 VITALS — BP 126/84 | HR 101 | Wt 284.6 lb

## 2018-01-05 DIAGNOSIS — K219 Gastro-esophageal reflux disease without esophagitis: Secondary | ICD-10-CM

## 2018-01-05 DIAGNOSIS — IMO0001 Reserved for inherently not codable concepts without codable children: Secondary | ICD-10-CM

## 2018-01-05 DIAGNOSIS — E1165 Type 2 diabetes mellitus with hyperglycemia: Secondary | ICD-10-CM

## 2018-01-05 DIAGNOSIS — E785 Hyperlipidemia, unspecified: Secondary | ICD-10-CM

## 2018-01-05 DIAGNOSIS — G4733 Obstructive sleep apnea (adult) (pediatric): Secondary | ICD-10-CM

## 2018-01-05 DIAGNOSIS — Z794 Long term (current) use of insulin: Secondary | ICD-10-CM | POA: Diagnosis not present

## 2018-01-05 DIAGNOSIS — I10 Essential (primary) hypertension: Secondary | ICD-10-CM | POA: Diagnosis not present

## 2018-01-05 DIAGNOSIS — J209 Acute bronchitis, unspecified: Secondary | ICD-10-CM

## 2018-01-05 LAB — POCT GLYCOSYLATED HEMOGLOBIN (HGB A1C): Hemoglobin A1C: 14

## 2018-01-05 MED ORDER — BECLOMETHASONE DIPROP HFA 80 MCG/ACT IN AERB: INHALATION_SPRAY | RESPIRATORY_TRACT | 0 refills | Status: DC

## 2018-01-05 MED ORDER — AZITHROMYCIN 250 MG PO TABS: ORAL_TABLET | ORAL | 0 refills | Status: DC

## 2018-01-05 MED ORDER — GUAIFENESIN-CODEINE 100-10 MG/5ML PO SYRP: 5.0000 mL | ORAL_SOLUTION | Freq: Three times a day (TID) | ORAL | 0 refills | Status: DC | PRN

## 2018-01-05 MED ORDER — PANTOPRAZOLE SODIUM 40 MG PO TBEC: 40.0000 mg | DELAYED_RELEASE_TABLET | Freq: Every day | ORAL | 3 refills | Status: DC

## 2018-01-05 MED ORDER — INSULIN GLARGINE 100 UNIT/ML SOLOSTAR PEN: 40.0000 [IU] | PEN_INJECTOR | SUBCUTANEOUS | 5 refills | Status: DC

## 2018-01-05 MED FILL — AZITHROMYCIN 250 MG TABLET: 250 | 5 days supply | Qty: 6 | Fill #0

## 2018-01-05 MED FILL — QVAR REDIHALER 80 MCG/ACT A: 80 | 27 days supply | Qty: 11 | Fill #0

## 2018-01-05 MED FILL — GUAIATUSSIN AC LIQUID: 100-10 | 5 days supply | Qty: 75 | Fill #0

## 2018-01-05 MED FILL — LANTUS SOLOSTAR 100 UNITS/M: 100 | 30 days supply | Qty: 12 | Fill #0

## 2018-01-05 MED FILL — BD ULTRA FINE PEN NEEDLES: 29G X 12.7M | 30 days supply | Qty: 100 | Fill #0

## 2018-01-05 NOTE — Progress Notes (Signed)
Subjective:    Patient ID: Catherine Espinoza, female    DOB: 23-Jul-1976, 42 y.o.   MRN: [id number]  HPI Pt returns for f/u of diabetes mellitus: DM type: Insulin-requiring type 2 Dx'ed: 2119 Complications: none Therapy: insulin since soon after dx GDM: never DKA: never Severe hypoglycemia: never Pancreatitis: never Pancreatic imaging: normal on 2015 CT.  Other: She has had TAH; she cannot afford med copays or copays for weight loss surgery.   Interval history:  Pt reports symptoms: none today Pt takes meds as rx'ed.  Pt says she seldom checks cbg.  Pt says diet is poor.  She says high copays impair her ability to care for her DM.   Past Medical History:  Diagnosis Date  . Anemia, unspecified   . Excessive or frequent menstruation   . Morbid obesity ([place])   . Obstructive sleep apnea 02/11/11   Sleep study: severe OSA- rec CPAP 20cm small  full face mask  . Proteinuria   . Type II or unspecified type diabetes mellitus without mention of complication, not stated as uncontrolled   . Unspecified essential hypertension     Past Surgical History:  Procedure Laterality Date  . ABDOMINAL HYSTERECTOMY    . ABLATION COLPOCLESIS    . CESAREAN SECTION    . CLEFT PALATE REPAIR    . cyst removal    . ECTOPIC PREGNANCY SURGERY     x 2  . OOPHORECTOMY Right 2002    Social History   Socioeconomic History  . Marital status: Single    Spouse name: Not on file  . Number of children: Not on file  . Years of education: Not on file  . Highest education level: Not on file  Occupational History  . Occupation: cna/med Engineer, production: [employer]  Social Needs  . Financial resource strain: Not on file  . Food insecurity:    Worry: Not on file    Inability: Not on file  . Transportation needs:    Medical: Not on file    Non-medical: Not on file  Tobacco Use  . Smoking status: Current Every Day Smoker    Packs/day: 0.50    Years: 10.00    Pack years: 5.00    Types:  Cigarettes  . Smokeless tobacco: Never Used  Substance and Sexual Activity  . Alcohol use: Yes    Alcohol/week: 0.0 oz    Comment: occasional use  . Drug use: No  . Sexual activity: Yes    Birth control/protection: Surgical  Lifestyle  . Physical activity:    Days per week: Not on file    Minutes per session: Not on file  . Stress: Not on file  Relationships  . Social connections:    Talks on phone: Not on file    Gets together: Not on file    Attends religious service: Not on file    Active member of club or organization: Not on file    Attends meetings of clubs or organizations: Not on file    Relationship status: Not on file  . Intimate partner violence:    Fear of current or ex partner: Not on file    Emotionally abused: Not on file    Physically abused: Not on file    Forced sexual activity: Not on file  Other Topics Concern  . Not on file  Social History Narrative   Gerome Sam for divorced   Daughter born 2003   Works as Industrial/product designer  Current Outpatient Medications on File Prior to Visit  Medication Sig Dispense Refill  . albuterol (PROVENTIL HFA;VENTOLIN HFA) 108 (90 Base) MCG/ACT inhaler Inhale 2 puffs into the lungs every 6 (six) hours as needed for wheezing or shortness of breath. 1 Inhaler 0  . amLODipine (NORVASC) 10 MG tablet Take 1 tablet (10 mg total) by mouth daily. 30 tablet 2  . atorvastatin (LIPITOR) 40 MG tablet TAKE 1 TABLET (40 MG TOTAL) BY MOUTH DAILY. 30 tablet 5  . BAYER MICROLET LANCETS lancets Use as instructed to check blood sugar three times daily.  DX  E11.65 100 each 5  . glucose blood (BAYER CONTOUR TEST) test strip Use to check blood sugar 3 times per day.  DX  E11.65 100 each 5  . lisinopril (PRINIVIL,ZESTRIL) 20 MG tablet Take 1 tablet (20 mg total) by mouth daily. 90 tablet 1   No current facility-administered medications on file prior to visit.     Allergies  Allergen Reactions  . Ibuprofen     REACTION: knots in mouth with  SOB  . Labetalol Nausea Only  . Aspirin     Knots in mouth     Family History  Problem Relation Age of Onset  . Heart attack Maternal Aunt   . Diabetes Mother   . Cancer Father        oral cancer    BP 126/84 (BP Location: Left Arm, Patient Position: Sitting, Cuff Size: Normal)   Pulse (!) 101   Wt 284 lb 9.6 oz (129.1 kg)   LMP 09/13/2011   SpO2 97%   BMI 47.36 kg/m    Review of Systems She denies hypoglycemia.      Objective:   Physical Exam VITAL SIGNS:  See vs page GENERAL: no distress Pulses: dorsalis pedis intact bilat.   MSK: no deformity of the feet CV: no leg edema Skin:  no ulcer on the feet.  normal color and temp on the feet. Neuro: sensation is intact to touch on the feet Ext: There is bilateral onychomycosis of the toenails.    Lab Results  Component Value Date   HGBA1C 14.0 01/05/2018       Assessment & Plan:  Insulin-requiring type 2 DM: with very poor glycemic control.  She should change to a qd insulin schedule.  I gave pt a copay card.    Patient Instructions  check your blood sugar twice a day.  vary the time of day when you check, between before the 3 meals, and at bedtime.  also check if you have symptoms of your blood sugar being too high or too low.  please keep a record of the readings and bring it to your next appointment here (or you can bring the meter itself).  You can write it on any piece of paper.  please call us sooner if your blood sugar goes below 70, or if you have a lot of readings over 200.   For now, please: Stop taking the humalog and januvia, and: Increase the lantus to 40 units each morning.   Please come back for a follow-up appointment in 1 month.

## 2018-01-05 NOTE — Patient Instructions (Addendum)
Stop omeprazole, start protonix.   Start Qvar for asthma flare- 4 puffs twice daily for 3 days then decrease to 2 puffs twice daily. Begin zpak (antibiotic) as well as cheratussin (cough syrup as needed). Cheratussin may cause drowsiness so do not drive after taking.  Call if new/worsenint symptoms or if symptoms are if symptoms are not improved in 3-4 days.

## 2018-01-05 NOTE — Patient Instructions (Addendum)
check your blood sugar twice a day.  vary the time of day when you check, between before the 3 meals, and at bedtime.  also check if you have symptoms of your blood sugar being too high or too low.  please keep a record of the readings and bring it to your next appointment here (or you can bring the meter itself).  You can write it on any piece of paper.  please call us sooner if your blood sugar goes below 70, or if you have a lot of readings over 200.   For now, please: Stop taking the humalog and januvia, and: Increase the lantus to 40 units each morning.   Please come back for a follow-up appointment in 1 month.

## 2018-01-05 NOTE — Telephone Encounter (Signed)
PA denied. Pharmacy benefit does not provide coverage for this medication or drug class.

## 2018-01-05 NOTE — Progress Notes (Signed)
Subjective:    Patient ID: Catherine Espinoza, female    DOB: [birth year], 42 y.o.   MRN: [id number]  HPI  Patient is a 42 year old female who presents today for follow-up.  She reports + cough- started Thursday- "real bad Saturday."  She has been using delym and mucinex "day and night."  Using ventolin prn which is not helping. Reports that she had a fever on Saturday and the early part of Monday.  Reports 3 episodes of loose stools yesterday afternoon and once this AM.    Diabetes type 2- she saw Dr. Loanne Drilling (endo today for follow up). Lab Results  Component Value Date   HGBA1C 14.0 01/05/2018   HGBA1C 13.7 (H) 10/24/2017   HGBA1C 10.2 (H) 01/13/2017   Lab Results  Component Value Date   MICROALBUR 6.6 (H) 06/21/2016   LDLCALC 134 (H) 10/24/2017   CREATININE 0.57 10/24/2017   GERD- reports worsening symptoms. Omeprazole is non-formulary.   Tobacco abuse- reports that insurance is not covering chantix.    OSA- reports that she is wearing cpap.  Has upcoming appointment with Dr. Elsworth Soho.   HTN- maintained on amlodipine 10mg  and lisinopril 20mg .   BP Readings from Last 3 Encounters:  01/05/18 114/85  01/05/18 126/84  10/24/17 (!) 151/99   Hyperlipidemia- maintained on lipitor 40mg .  Lab Results  Component Value Date   CHOL 195 10/24/2017   HDL 41.10 10/24/2017   LDLCALC 134 (H) 10/24/2017   LDLDIRECT 164.9 08/11/2014   TRIG 100.0 10/24/2017   CHOLHDL 5 10/24/2017     Review of Systems    see HPI  Past Medical History:  Diagnosis Date  . Anemia, unspecified   . Excessive or frequent menstruation   . Morbid obesity ([place])   . Obstructive sleep apnea 02/11/11   Sleep study: severe OSA- rec CPAP 20cm small  full face mask  . Proteinuria   . Type II or unspecified type diabetes mellitus without mention of complication, not stated as uncontrolled   . Unspecified essential hypertension      Social History   Socioeconomic History  . Marital status: Single    Spouse  name: Not on file  . Number of children: Not on file  . Years of education: Not on file  . Highest education level: Not on file  Occupational History  . Occupation: cna/med Engineer, production: [employer]  Social Needs  . Financial resource strain: Not on file  . Food insecurity:    Worry: Not on file    Inability: Not on file  . Transportation needs:    Medical: Not on file    Non-medical: Not on file  Tobacco Use  . Smoking status: Current Every Day Smoker    Packs/day: 0.50    Years: 10.00    Pack years: 5.00    Types: Cigarettes  . Smokeless tobacco: Never Used  Substance and Sexual Activity  . Alcohol use: Yes    Alcohol/week: 0.0 oz    Comment: occasional use  . Drug use: No  . Sexual activity: Yes    Birth control/protection: Surgical  Lifestyle  . Physical activity:    Days per week: Not on file    Minutes per session: Not on file  . Stress: Not on file  Relationships  . Social connections:    Talks on phone: Not on file    Gets together: Not on file    Attends religious service: Not on file    Active member  of club or organization: Not on file    Attends meetings of clubs or organizations: Not on file    Relationship status: Not on file  . Intimate partner violence:    Fear of current or ex partner: Not on file    Emotionally abused: Not on file    Physically abused: Not on file    Forced sexual activity: Not on file  Other Topics Concern  . Not on file  Social History Narrative   Gerome Sam for divorced   Daughter born 2003   Works as cna/med tech    Past Surgical History:  Procedure Laterality Date  . ABDOMINAL HYSTERECTOMY    . ABLATION COLPOCLESIS    . CESAREAN SECTION    . CLEFT PALATE REPAIR    . cyst removal    . ECTOPIC PREGNANCY SURGERY     x 2  . OOPHORECTOMY Right 2002    Family History  Problem Relation Age of Onset  . Heart attack Maternal Aunt   . Diabetes Mother   . Cancer Father        oral cancer    Allergies    Allergen Reactions  . Ibuprofen     REACTION: knots in mouth with SOB  . Labetalol Nausea Only  . Aspirin     Knots in mouth     Current Outpatient Medications on File Prior to Visit  Medication Sig Dispense Refill  . albuterol (PROVENTIL HFA;VENTOLIN HFA) 108 (90 Base) MCG/ACT inhaler Inhale 2 puffs into the lungs every 6 (six) hours as needed for wheezing or shortness of breath. 1 Inhaler 0  . amLODipine (NORVASC) 10 MG tablet Take 1 tablet (10 mg total) by mouth daily. 30 tablet 2  . atorvastatin (LIPITOR) 40 MG tablet TAKE 1 TABLET (40 MG TOTAL) BY MOUTH DAILY. 30 tablet 5  . BAYER MICROLET LANCETS lancets Use as instructed to check blood sugar three times daily.  DX  E11.65 100 each 5  . glucose blood (BAYER CONTOUR TEST) test strip Use to check blood sugar 3 times per day.  DX  E11.65 100 each 5  . Insulin Glargine (LANTUS SOLOSTAR) 100 UNIT/ML Solostar Pen Inject 40 Units into the skin every morning. And pen needles 1/day 15 mL 5  . lisinopril (PRINIVIL,ZESTRIL) 20 MG tablet Take 1 tablet (20 mg total) by mouth daily. 90 tablet 1  . omeprazole (PRILOSEC) 40 MG capsule TAKE 1 CAPSULE (40 MG TOTAL) BY MOUTH DAILY. 30 capsule 5   No current facility-administered medications on file prior to visit.     BP 114/85 (BP Location: Right Arm, Cuff Size: Large)   Pulse (!) 101   Temp 98.9 F (37.2 C) (Oral)   Resp 18   Ht 5\' 5"  (1.651 m)   Wt 284 lb 6.4 oz (129 kg)   LMP 09/13/2011   SpO2 95%   BMI 47.33 kg/m    Objective:   Physical Exam  Constitutional: She is oriented to person, place, and time. She appears well-developed and well-nourished.  HENT:  Head: Normocephalic and atraumatic.  Cardiovascular: Normal rate, regular rhythm and normal heart sounds.  No murmur heard. Pulmonary/Chest: Effort normal. No respiratory distress. She has wheezes.  Musculoskeletal: She exhibits no edema.  Neurological: She is alert and oriented to person, place, and time.  Psychiatric: She  has a normal mood and affect. Her behavior is normal. Judgment and thought content normal.          Assessment & Plan:  Acute  bronchitis with bronchospasm- advised pt as follows:     Start Qvar for asthma flare- 4 puffs twice daily for 3 days then decrease to 2 puffs twice daily. Begin zpak (antibiotic) as well as cheratussin (cough syrup as needed). Cheratussin may cause drowsiness so do not drive after taking.  Call if new/worsenint symptoms or if symptoms are if symptoms are not improved in 3-4 days.   GERD- uncontrolled. Rx sent for protonix in place of omeprazole.  Tobacco abuse-we discussed tobacco cessation.  Discussed possibility of Wellbutrin.  She states this has not helped her in the past.  We also discussed over-the-counter purchase of nicotine patches.  She will think about it and let me know if she wants a prescription for Wellbutrin.  Hypertension-blood pressure is stable continue current meds.  OSA-she is advised to keep her upcoming appointment with Dr. Elsworth Soho.  Continue CPAP.  Hyperlipidemia- stable on statin.  Continue same.

## 2018-01-05 NOTE — Telephone Encounter (Signed)
PA initiated via Covermymeds; KEY: [id number]. Awaiting determination.

## 2018-01-15 ENCOUNTER — Encounter: Payer: Self-pay | Admitting: Family

## 2018-02-04 ENCOUNTER — Ambulatory Visit: Payer: BLUE CROSS/BLUE SHIELD | Admitting: Endocrinology

## 2018-02-04 ENCOUNTER — Encounter: Payer: Self-pay | Admitting: Endocrinology

## 2018-02-04 VITALS — BP 132/88 | HR 100 | Wt 290.2 lb

## 2018-02-04 DIAGNOSIS — E1165 Type 2 diabetes mellitus with hyperglycemia: Secondary | ICD-10-CM

## 2018-02-04 MED ORDER — INSULIN GLARGINE 100 UNIT/ML SOLOSTAR PEN: 55.0000 [IU] | PEN_INJECTOR | SUBCUTANEOUS | 5 refills | Status: DC

## 2018-02-04 NOTE — Progress Notes (Signed)
Subjective:    Patient ID: Catherine Espinoza, female    DOB: 12/04/1975, 42 y.o.   MRN: [id number]  HPI Pt returns for f/u of diabetes mellitus: DM type: Insulin-requiring type 2 Dx'ed: 3734 Complications: none Therapy: insulin since soon after dx GDM: never DKA: never Severe hypoglycemia: never Pancreatitis: never Pancreatic imaging: normal on 2015 CT.  Other: She has had TAH; she cannot afford med copays or copays for weight loss surgery.   Interval history: no cbg record, but states cbg's vary from 180-300.   Past Medical History:  Diagnosis Date  . Anemia, unspecified   . Excessive or frequent menstruation   . Morbid obesity ([place])   . Obstructive sleep apnea 02/11/11   Sleep study: severe OSA- rec CPAP 20cm small  full face mask  . Proteinuria   . Type II or unspecified type diabetes mellitus without mention of complication, not stated as uncontrolled   . Unspecified essential hypertension     Past Surgical History:  Procedure Laterality Date  . ABDOMINAL HYSTERECTOMY    . ABLATION COLPOCLESIS    . CESAREAN SECTION    . CLEFT PALATE REPAIR    . cyst removal    . ECTOPIC PREGNANCY SURGERY     x 2  . OOPHORECTOMY Right 2002    Social History   Socioeconomic History  . Marital status: Single    Spouse name: Not on file  . Number of children: Not on file  . Years of education: Not on file  . Highest education level: Not on file  Occupational History  . Occupation: cna/med Engineer, production: [employer]  Social Needs  . Financial resource strain: Not on file  . Food insecurity:    Worry: Not on file    Inability: Not on file  . Transportation needs:    Medical: Not on file    Non-medical: Not on file  Tobacco Use  . Smoking status: Current Every Day Smoker    Packs/day: 0.50    Years: 10.00    Pack years: 5.00    Types: Cigarettes  . Smokeless tobacco: Never Used  Substance and Sexual Activity  . Alcohol use: Yes    Alcohol/week: 0.0 oz   Comment: occasional use  . Drug use: No  . Sexual activity: Yes    Birth control/protection: Surgical  Lifestyle  . Physical activity:    Days per week: Not on file    Minutes per session: Not on file  . Stress: Not on file  Relationships  . Social connections:    Talks on phone: Not on file    Gets together: Not on file    Attends religious service: Not on file    Active member of club or organization: Not on file    Attends meetings of clubs or organizations: Not on file    Relationship status: Not on file  . Intimate partner violence:    Fear of current or ex partner: Not on file    Emotionally abused: Not on file    Physically abused: Not on file    Forced sexual activity: Not on file  Other Topics Concern  . Not on file  Social History Narrative   Gerome Sam for divorced   Daughter born 2003   Works as cna/med tech    Current Outpatient Medications on File Prior to Visit  Medication Sig Dispense Refill  . albuterol (PROVENTIL HFA;VENTOLIN HFA) 108 (90 Base) MCG/ACT inhaler Inhale 2 puffs into the  lungs every 6 (six) hours as needed for wheezing or shortness of breath. 1 Inhaler 0  . amLODipine (NORVASC) 10 MG tablet Take 1 tablet (10 mg total) by mouth daily. 30 tablet 2  . atorvastatin (LIPITOR) 40 MG tablet TAKE 1 TABLET (40 MG TOTAL) BY MOUTH DAILY. 30 tablet 5  . BAYER MICROLET LANCETS lancets Use as instructed to check blood sugar three times daily.  DX  E11.65 100 each 5  . beclomethasone (QVAR) 80 MCG/ACT inhaler 4 puffs twice daily for next 3 days, then 2 puffs twice daily 1 Inhaler 0  . glucose blood (BAYER CONTOUR TEST) test strip Use to check blood sugar 3 times per day.  DX  E11.65 100 each 5  . guaiFENesin-codeine (CHERATUSSIN AC) 100-10 MG/5ML syrup Take 5 mLs by mouth 3 (three) times daily as needed for cough. 75 mL 0  . lisinopril (PRINIVIL,ZESTRIL) 20 MG tablet Take 1 tablet (20 mg total) by mouth daily. 90 tablet 1  . pantoprazole (PROTONIX) 40 MG  tablet Take 1 tablet (40 mg total) by mouth daily. 30 tablet 3   No current facility-administered medications on file prior to visit.     Allergies  Allergen Reactions  . Ibuprofen     REACTION: knots in mouth with SOB  . Labetalol Nausea Only  . Aspirin     Knots in mouth     Family History  Problem Relation Age of Onset  . Heart attack Maternal Aunt   . Diabetes Mother   . Cancer Father        oral cancer    BP 132/88   Pulse 100   Wt 290 lb 3.2 oz (131.6 kg)   LMP 09/13/2011   SpO2 92%   BMI 48.29 kg/m    Review of Systems She denies hypoglycemia    Objective:   Physical Exam VITAL SIGNS:  See vs page GENERAL: no distress Pulses: dorsalis pedis intact bilat.   MSK: no deformity of the feet CV: no leg edema Skin:  no ulcer on the feet.  normal color and temp on the feet. Neuro: sensation is intact to touch on the feet Ext: There is bilateral onychomycosis of the toenails      Assessment & Plan:  Insulin-requiring type 2 DM: she needs increased rx.   Patient Instructions  check your blood sugar twice a day.  vary the time of day when you check, between before the 3 meals, and at bedtime.  also check if you have symptoms of your blood sugar being too high or too low.  please keep a record of the readings and bring it to your next appointment here (or you can bring the meter itself).  You can write it on any piece of paper.  please call us sooner if your blood sugar goes below 70, or if you have a lot of readings over 200.   Increase the lantus to 55 units each morning.   Please come back for a follow-up appointment in 2 months.

## 2018-02-04 NOTE — Patient Instructions (Addendum)
check your blood sugar twice a day.  vary the time of day when you check, between before the 3 meals, and at bedtime.  also check if you have symptoms of your blood sugar being too high or too low.  please keep a record of the readings and bring it to your next appointment here (or you can bring the meter itself).  You can write it on any piece of paper.  please call us sooner if your blood sugar goes below 70, or if you have a lot of readings over 200.   Increase the lantus to 55 units each morning.   Please come back for a follow-up appointment in 2 months.

## 2018-02-13 ENCOUNTER — Encounter: Payer: Self-pay | Admitting: Family

## 2018-02-13 ENCOUNTER — Ambulatory Visit (INDEPENDENT_AMBULATORY_CARE_PROVIDER_SITE_OTHER): Payer: BLUE CROSS/BLUE SHIELD | Admitting: Family

## 2018-02-13 VITALS — BP 130/108 | HR 120 | Temp 99.9°F | Resp 16 | Ht 65.0 in | Wt 285.2 lb

## 2018-02-13 DIAGNOSIS — I10 Essential (primary) hypertension: Secondary | ICD-10-CM

## 2018-02-13 DIAGNOSIS — R21 Rash and other nonspecific skin eruption: Secondary | ICD-10-CM

## 2018-02-13 DIAGNOSIS — L0390 Cellulitis, unspecified: Secondary | ICD-10-CM | POA: Diagnosis not present

## 2018-02-13 MED ORDER — CEPHALEXIN 500 MG PO CAPS
500.0000 mg | ORAL_CAPSULE | Freq: Three times a day (TID) | ORAL | 0 refills | Status: DC
Start: 2018-02-13 — End: 2018-02-27

## 2018-02-13 MED FILL — LANTUS SOLOSTAR 100 UNITS/M: 100 | 27 days supply | Qty: 15 | Fill #0

## 2018-02-13 MED FILL — CEPHALEXIN 500 MG CAPSULE: 500 | 7 days supply | Qty: 21 | Fill #0

## 2018-02-13 NOTE — Progress Notes (Signed)
Subjective:    Patient ID: Catherine Espinoza, female    DOB: 04-29-1976, 42 y.o.   MRN: [id number]  HPI   Last A1C was 14.   Review of Systems Past Medical History:  Diagnosis Date  . Anemia, unspecified   . Excessive or frequent menstruation   . Morbid obesity ([place])   . Obstructive sleep apnea 02/11/11   Sleep study: severe OSA- rec CPAP 20cm small  full face mask  . Proteinuria   . Type II or unspecified type diabetes mellitus without mention of complication, not stated as uncontrolled   . Unspecified essential hypertension      Social History   Socioeconomic History  . Marital status: Single    Spouse name: Not on file  . Number of children: Not on file  . Years of education: Not on file  . Highest education level: Not on file  Occupational History  . Occupation: cna/med Engineer, production: [employer]  Social Needs  . Financial resource strain: Not on file  . Food insecurity:    Worry: Not on file    Inability: Not on file  . Transportation needs:    Medical: Not on file    Non-medical: Not on file  Tobacco Use  . Smoking status: Current Every Day Smoker    Packs/day: 0.50    Years: 10.00    Pack years: 5.00    Types: Cigarettes  . Smokeless tobacco: Never Used  Substance and Sexual Activity  . Alcohol use: Yes    Alcohol/week: 0.0 oz    Comment: occasional use  . Drug use: No  . Sexual activity: Yes    Birth control/protection: Surgical  Lifestyle  . Physical activity:    Days per week: Not on file    Minutes per session: Not on file  . Stress: Not on file  Relationships  . Social connections:    Talks on phone: Not on file    Gets together: Not on file    Attends religious service: Not on file    Active member of club or organization: Not on file    Attends meetings of clubs or organizations: Not on file    Relationship status: Not on file  . Intimate partner violence:    Fear of current or ex partner: Not on file    Emotionally abused: Not  on file    Physically abused: Not on file    Forced sexual activity: Not on file  Other Topics Concern  . Not on file  Social History Narrative   Gerome Sam for divorced   Daughter born 2003   Works as cna/med tech    Past Surgical History:  Procedure Laterality Date  . ABDOMINAL HYSTERECTOMY    . ABLATION COLPOCLESIS    . CESAREAN SECTION    . CLEFT PALATE REPAIR    . cyst removal    . ECTOPIC PREGNANCY SURGERY     x 2  . OOPHORECTOMY Right 2002    Family History  Problem Relation Age of Onset  . Heart attack Maternal Aunt   . Diabetes Mother   . Cancer Father        oral cancer    Allergies  Allergen Reactions  . Ibuprofen     REACTION: knots in mouth with SOB  . Labetalol Nausea Only  . Aspirin     Knots in mouth     Current Outpatient Medications on File Prior to Visit  Medication Sig Dispense  Refill  . albuterol (PROVENTIL HFA;VENTOLIN HFA) 108 (90 Base) MCG/ACT inhaler Inhale 2 puffs into the lungs every 6 (six) hours as needed for wheezing or shortness of breath. 1 Inhaler 0  . amLODipine (NORVASC) 10 MG tablet Take 1 tablet (10 mg total) by mouth daily. 30 tablet 2  . atorvastatin (LIPITOR) 40 MG tablet TAKE 1 TABLET (40 MG TOTAL) BY MOUTH DAILY. 30 tablet 5  . BAYER MICROLET LANCETS lancets Use as instructed to check blood sugar three times daily.  DX  E11.65 100 each 5  . beclomethasone (QVAR) 80 MCG/ACT inhaler 4 puffs twice daily for next 3 days, then 2 puffs twice daily 1 Inhaler 0  . glucose blood (BAYER CONTOUR TEST) test strip Use to check blood sugar 3 times per day.  DX  E11.65 100 each 5  . guaiFENesin-codeine (CHERATUSSIN AC) 100-10 MG/5ML syrup Take 5 mLs by mouth 3 (three) times daily as needed for cough. 75 mL 0  . Insulin Glargine (LANTUS SOLOSTAR) 100 UNIT/ML Solostar Pen Inject 55 Units into the skin every morning. And pen needles 1/day 30 mL 5  . lisinopril (PRINIVIL,ZESTRIL) 20 MG tablet Take 1 tablet (20 mg total) by mouth daily. 90  tablet 1  . pantoprazole (PROTONIX) 40 MG tablet Take 1 tablet (40 mg total) by mouth daily. 30 tablet 3   No current facility-administered medications on file prior to visit.     BP (!) 130/108 (BP Location: Left Arm, Patient Position: Sitting, Cuff Size: Large)   Pulse (!) 120   Temp 99.9 F (37.7 C) (Oral)   Resp 16   Ht 5\' 5"  (1.651 m)   Wt 285 lb 3.2 oz (129.4 kg)   LMP 09/13/2011   SpO2 94%   BMI 47.46 kg/m       Objective:   Physical Exam  Constitutional: She appears well-developed and well-nourished.  Skin: Skin is warm and dry.  Superficial ulcerations at the base of the left buttock with  Some drainage, erythema/induration No Fluctuance noted.   Psychiatric: She has a normal mood and affect. Her behavior is normal. Judgment and thought content normal.          Assessment & Plan:  HTN-  BP elevated this AM, did not take AM bp medication. We discussed importance of med compliance. Repeat next week.   Cellulitis/rash- I am not sure if this may be an HSV 2 breakout with associated cellulitis. Will check HSV2 titer, start keflex, follow up in 1 week, sooner if symptoms worse.

## 2018-02-13 NOTE — Patient Instructions (Addendum)
Please complete lab work prior to leaving.  Begin keflex.  Call if increased pain/redness/swelling or fever.

## 2018-02-16 ENCOUNTER — Encounter: Payer: Self-pay | Admitting: Family

## 2018-02-17 NOTE — Telephone Encounter (Signed)
Pt was contacted later by cma, see result note.

## 2018-02-18 LAB — HSV 1/2 AB (IGM), IFA W/RFLX TITER
HSV 1 IgM Screen: NEGATIVE
HSV 2 IgM Screen: NEGATIVE

## 2018-02-18 LAB — HSV 2 ANTIBODY, IGG: HSV 2 GLYCOPROTEIN G AB, IGG: 9.24 {index} — AB

## 2018-02-20 ENCOUNTER — Ambulatory Visit: Payer: BLUE CROSS/BLUE SHIELD | Admitting: Family

## 2018-02-20 ENCOUNTER — Encounter: Payer: Self-pay | Admitting: Family

## 2018-02-20 VITALS — BP 132/86 | HR 102 | Temp 98.6°F | Resp 16 | Ht 65.0 in | Wt 287.2 lb

## 2018-02-20 DIAGNOSIS — B009 Herpesviral infection, unspecified: Secondary | ICD-10-CM | POA: Diagnosis not present

## 2018-02-20 MED ORDER — VALACYCLOVIR HCL 1 G PO TABS: ORAL_TABLET | ORAL | 1 refills | Status: DC

## 2018-02-20 MED FILL — valACYclovir HCL 1 GM TABS: 1 | 15 days supply | Qty: 15 | Fill #0

## 2018-02-20 NOTE — Progress Notes (Signed)
Subjective:    Patient ID: Catherine Espinoza, female    DOB: 04-04-1976, 42 y.o.   MRN: [id number]  HPI  Patient is a 42 year old female who presents today for follow-up of her skin infection.  Last visit she noted skin ulceration on the left buttock.  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  She reports near resolution of the skin abscess.  Pain has resolved.   Review of Systems    see HPI  Past Medical History:  Diagnosis Date  . Anemia, unspecified   . Excessive or frequent menstruation   . Morbid obesity ([place])   . Obstructive sleep apnea 02/11/11   Sleep study: severe OSA- rec CPAP 20cm small  full face mask  . Proteinuria   . Type II or unspecified type diabetes mellitus without mention of complication, not stated as uncontrolled   . Unspecified essential hypertension      Social History   Socioeconomic History  . Marital status: Single    Spouse name: Not on file  . Number of children: Not on file  . Years of education: Not on file  . Highest education level: Not on file  Occupational History  . Occupation: cna/med Engineer, production: [employer]  Social Needs  . Financial resource strain: Not on file  . Food insecurity:    Worry: Not on file    Inability: Not on file  . Transportation needs:    Medical: Not on file    Non-medical: Not on file  Tobacco Use  . Smoking status: Current Every Day Smoker    Packs/day: 0.50    Years: 10.00    Pack years: 5.00    Types: Cigarettes  . Smokeless tobacco: Never Used  Substance and Sexual Activity  . Alcohol use: Yes    Alcohol/week: 0.0 oz    Comment: occasional use  . Drug use: No  . Sexual activity: Yes    Birth control/protection: Surgical  Lifestyle  . Physical activity:    Days per week: Not on file    Minutes per session: Not on file  . Stress: Not on file  Relationships  . Social connections:    Talks on phone: Not on file    Gets together: Not on  file    Attends religious service: Not on file    Active member of club or organization: Not on file    Attends meetings of clubs or organizations: Not on file    Relationship status: Not on file  . Intimate partner violence:    Fear of current or ex partner: Not on file    Emotionally abused: Not on file    Physically abused: Not on file    Forced sexual activity: Not on file  Other Topics Concern  . Not on file  Social History Narrative   Gerome Sam for divorced   Daughter born 2003   Works as cna/med tech    Past Surgical History:  Procedure Laterality Date  . ABDOMINAL HYSTERECTOMY    . ABLATION COLPOCLESIS    . CESAREAN SECTION    . CLEFT PALATE REPAIR    . cyst removal    . ECTOPIC PREGNANCY SURGERY     x 2  . OOPHORECTOMY Right 2002    Family History  Problem Relation Age of Onset  . Heart attack Maternal Aunt   . Diabetes Mother   . Cancer Father  oral cancer    Allergies  Allergen Reactions  . Ibuprofen     REACTION: knots in mouth with SOB  . Labetalol Nausea Only  . Aspirin     Knots in mouth     Current Outpatient Medications on File Prior to Visit  Medication Sig Dispense Refill  . albuterol (PROVENTIL HFA;VENTOLIN HFA) 108 (90 Base) MCG/ACT inhaler Inhale 2 puffs into the lungs every 6 (six) hours as needed for wheezing or shortness of breath. 1 Inhaler 0  . amLODipine (NORVASC) 10 MG tablet Take 1 tablet (10 mg total) by mouth daily. 30 tablet 2  . atorvastatin (LIPITOR) 40 MG tablet TAKE 1 TABLET (40 MG TOTAL) BY MOUTH DAILY. 30 tablet 5  . BAYER MICROLET LANCETS lancets Use as instructed to check blood sugar three times daily.  DX  E11.65 100 each 5  . beclomethasone (QVAR) 80 MCG/ACT inhaler 4 puffs twice daily for next 3 days, then 2 puffs twice daily 1 Inhaler 0  . cephALEXin (KEFLEX) 500 MG capsule Take 1 capsule (500 mg total) by mouth 3 (three) times daily. 21 capsule 0  . glucose blood (BAYER CONTOUR TEST) test strip Use to  check blood sugar 3 times per day.  DX  E11.65 100 each 5  . guaiFENesin-codeine (CHERATUSSIN AC) 100-10 MG/5ML syrup Take 5 mLs by mouth 3 (three) times daily as needed for cough. 75 mL 0  . Insulin Glargine (LANTUS SOLOSTAR) 100 UNIT/ML Solostar Pen Inject 55 Units into the skin every morning. And pen needles 1/day 30 mL 5  . lisinopril (PRINIVIL,ZESTRIL) 20 MG tablet Take 1 tablet (20 mg total) by mouth daily. 90 tablet 1  . pantoprazole (PROTONIX) 40 MG tablet Take 1 tablet (40 mg total) by mouth daily. 30 tablet 3   No current facility-administered medications on file prior to visit.     BP 132/86 (BP Location: Left Arm, Patient Position: Sitting, Cuff Size: Large)   Pulse (!) 102   Temp 98.6 F (37 C) (Oral)   Resp 16   Ht 5\' 5"  (1.651 m)   Wt 287 lb 3.2 oz (130.3 kg)   LMP 09/13/2011   SpO2 98%   BMI 47.79 kg/m    Objective:   Physical Exam  Constitutional: She appears well-developed and well-nourished.  Skin: Skin is warm and dry.  Skin ulceration left buttock is mostly healed.          Assessment & Plan:  Herpes type II infection- we discussed transmission and treatment of future outbreaks.  Questions were answered.  10 minutes were spent counseling the patient today on herpes type II. I did give her an rx for prn valtrex.

## 2018-02-20 NOTE — Patient Instructions (Signed)

## 2018-02-27 ENCOUNTER — Ambulatory Visit: Payer: BLUE CROSS/BLUE SHIELD | Admitting: Family

## 2018-02-27 VITALS — BP 154/98 | HR 100 | Temp 99.0°F | Resp 16 | Ht 65.0 in | Wt 285.0 lb

## 2018-02-27 DIAGNOSIS — N611 Abscess of the breast and nipple: Secondary | ICD-10-CM | POA: Diagnosis not present

## 2018-02-27 DIAGNOSIS — I10 Essential (primary) hypertension: Secondary | ICD-10-CM | POA: Diagnosis not present

## 2018-02-27 MED ORDER — TRAMADOL HCL 50 MG PO TABS: 50.0000 mg | ORAL_TABLET | Freq: Three times a day (TID) | ORAL | 0 refills | Status: DC | PRN

## 2018-02-27 MED ORDER — CEPHALEXIN 500 MG PO CAPS: 500.0000 mg | ORAL_CAPSULE | Freq: Three times a day (TID) | ORAL | 0 refills | Status: DC

## 2018-02-27 NOTE — Progress Notes (Signed)
Subjective:    Patient ID: Catherine Espinoza, female    DOB: Feb 09, 1976, 41 y.o.   MRN: [id number]  HPI  Patient is a 42 yr old female with hx of uncontrolled DM2 who presents today to discuss abscess on her left breast.   Lab Results  Component Value Date   HGBA1C 14.0 01/05/2018      Review of Systems See HPI  Past Medical History:  Diagnosis Date  . Anemia, unspecified   . Excessive or frequent menstruation   . Morbid obesity ([place])   . Obstructive sleep apnea 02/11/11   Sleep study: severe OSA- rec CPAP 20cm small  full face mask  . Proteinuria   . Type II or unspecified type diabetes mellitus without mention of complication, not stated as uncontrolled   . Unspecified essential hypertension      Social History   Socioeconomic History  . Marital status: Single    Spouse name: Not on file  . Number of children: Not on file  . Years of education: Not on file  . Highest education level: Not on file  Occupational History  . Occupation: cna/med Engineer, production: [employer]  Social Needs  . Financial resource strain: Not on file  . Food insecurity:    Worry: Not on file    Inability: Not on file  . Transportation needs:    Medical: Not on file    Non-medical: Not on file  Tobacco Use  . Smoking status: Current Every Day Smoker    Packs/day: 0.50    Years: 10.00    Pack years: 5.00    Types: Cigarettes  . Smokeless tobacco: Never Used  Substance and Sexual Activity  . Alcohol use: Yes    Alcohol/week: 0.0 oz    Comment: occasional use  . Drug use: No  . Sexual activity: Yes    Birth control/protection: Surgical  Lifestyle  . Physical activity:    Days per week: Not on file    Minutes per session: Not on file  . Stress: Not on file  Relationships  . Social connections:    Talks on phone: Not on file    Gets together: Not on file    Attends religious service: Not on file    Active member of club or organization: Not on file    Attends meetings of  clubs or organizations: Not on file    Relationship status: Not on file  . Intimate partner violence:    Fear of current or ex partner: Not on file    Emotionally abused: Not on file    Physically abused: Not on file    Forced sexual activity: Not on file  Other Topics Concern  . Not on file  Social History Narrative   Gerome Sam for divorced   Daughter born 2003   Works as cna/med tech    Past Surgical History:  Procedure Laterality Date  . ABDOMINAL HYSTERECTOMY    . ABLATION COLPOCLESIS    . CESAREAN SECTION    . CLEFT PALATE REPAIR    . cyst removal    . ECTOPIC PREGNANCY SURGERY     x 2  . OOPHORECTOMY Right 2002    Family History  Problem Relation Age of Onset  . Heart attack Maternal Aunt   . Diabetes Mother   . Cancer Father        oral cancer    Allergies  Allergen Reactions  . Ibuprofen     REACTION:  knots in mouth with SOB  . Labetalol Nausea Only  . Aspirin     Knots in mouth     Current Outpatient Medications on File Prior to Visit  Medication Sig Dispense Refill  . albuterol (PROVENTIL HFA;VENTOLIN HFA) 108 (90 Base) MCG/ACT inhaler Inhale 2 puffs into the lungs every 6 (six) hours as needed for wheezing or shortness of breath. 1 Inhaler 0  . amLODipine (NORVASC) 10 MG tablet Take 1 tablet (10 mg total) by mouth daily. 30 tablet 2  . atorvastatin (LIPITOR) 40 MG tablet TAKE 1 TABLET (40 MG TOTAL) BY MOUTH DAILY. 30 tablet 5  . BAYER MICROLET LANCETS lancets Use as instructed to check blood sugar three times daily.  DX  E11.65 100 each 5  . beclomethasone (QVAR) 80 MCG/ACT inhaler 4 puffs twice daily for next 3 days, then 2 puffs twice daily 1 Inhaler 0  . cephALEXin (KEFLEX) 500 MG capsule Take 1 capsule (500 mg total) by mouth 3 (three) times daily. 21 capsule 0  . glucose blood (BAYER CONTOUR TEST) test strip Use to check blood sugar 3 times per day.  DX  E11.65 100 each 5  . guaiFENesin-codeine (CHERATUSSIN AC) 100-10 MG/5ML syrup Take 5 mLs  by mouth 3 (three) times daily as needed for cough. 75 mL 0  . Insulin Glargine (LANTUS SOLOSTAR) 100 UNIT/ML Solostar Pen Inject 55 Units into the skin every morning. And pen needles 1/day 30 mL 5  . lisinopril (PRINIVIL,ZESTRIL) 20 MG tablet Take 1 tablet (20 mg total) by mouth daily. 90 tablet 1  . pantoprazole (PROTONIX) 40 MG tablet Take 1 tablet (40 mg total) by mouth daily. 30 tablet 3  . valACYclovir (VALTREX) 1000 MG tablet Take 1 tablet by mouth once daily for 5 days at start of rash 15 tablet 1   No current facility-administered medications on file prior to visit.     BP (!) 154/98 (BP Location: Left Arm, Patient Position: Sitting, Cuff Size: Large)   Pulse 100   Temp 99 F (37.2 C) (Oral)   Resp 16   Ht 5\' 5"  (1.651 m)   Wt 285 lb (129.3 kg)   LMP 09/13/2011   SpO2 95%   BMI 47.43 kg/m       Objective:   Physical Exam  Constitutional: She is oriented to person, place, and time. She appears well-developed and well-nourished.  Neurological: She is alert and oriented to person, place, and time.  Skin: Skin is warm and dry.  Firm tender indurated open abcess left breast- no fluctuance          Assessment & Plan:  Breast abscess/cellulitis- no indication for I and D at this time. It appears to be draining. Will rx with keflex. Requests rx for pain.  Rx provided for tramadol. Unable to access [place] controlled substance registry for review. Follow up in 1 week.  HTN- bp elevated. Plan to repeat bp at 1 week follow up. Likely up due to pain.

## 2018-02-27 NOTE — Patient Instructions (Signed)
Please begin keflex. Call if increased pain, swelling, redness or if you develop fever. You may use tramadol as needed for severe pain, then switch to tylenol as needed.

## 2018-03-06 ENCOUNTER — Ambulatory Visit (INDEPENDENT_AMBULATORY_CARE_PROVIDER_SITE_OTHER): Payer: BLUE CROSS/BLUE SHIELD | Admitting: Family

## 2018-03-07 NOTE — Progress Notes (Signed)
Patient checked in but left without being seen.

## 2018-03-17 MED FILL — traMADol HCL 50 MG TABS: 50 | 4 days supply | Qty: 12 | Fill #0

## 2018-03-17 MED FILL — CEPHALEXIN 500 MG CAPSULE: 500 | 7 days supply | Qty: 21 | Fill #0

## 2018-03-22 ENCOUNTER — Emergency Department (HOSPITAL_BASED_OUTPATIENT_CLINIC_OR_DEPARTMENT_OTHER): Payer: BLUE CROSS/BLUE SHIELD

## 2018-03-22 ENCOUNTER — Other Ambulatory Visit: Payer: Self-pay

## 2018-03-22 ENCOUNTER — Emergency Department (HOSPITAL_BASED_OUTPATIENT_CLINIC_OR_DEPARTMENT_OTHER)
Admission: EM | Admit: 2018-03-22 | Discharge: 2018-03-22 | Disposition: A | Discharge status: Home / Self Care | Payer: BLUE CROSS/BLUE SHIELD | Attending: Emergency Medicine | Admitting: Emergency Medicine

## 2018-03-22 ENCOUNTER — Encounter (HOSPITAL_BASED_OUTPATIENT_CLINIC_OR_DEPARTMENT_OTHER): Payer: Self-pay | Admitting: Emergency Medicine

## 2018-03-22 DIAGNOSIS — R0781 Pleurodynia: Secondary | ICD-10-CM | POA: Diagnosis not present

## 2018-03-22 DIAGNOSIS — M546 Pain in thoracic spine: Secondary | ICD-10-CM | POA: Diagnosis not present

## 2018-03-22 DIAGNOSIS — E119 Type 2 diabetes mellitus without complications: Secondary | ICD-10-CM | POA: Diagnosis not present

## 2018-03-22 DIAGNOSIS — I10 Essential (primary) hypertension: Secondary | ICD-10-CM | POA: Insufficient documentation

## 2018-03-22 DIAGNOSIS — M549 Dorsalgia, unspecified: Secondary | ICD-10-CM | POA: Diagnosis not present

## 2018-03-22 DIAGNOSIS — Z79899 Other long term (current) drug therapy: Secondary | ICD-10-CM | POA: Insufficient documentation

## 2018-03-22 DIAGNOSIS — D171 Benign lipomatous neoplasm of skin and subcutaneous tissue of trunk: Secondary | ICD-10-CM | POA: Diagnosis not present

## 2018-03-22 DIAGNOSIS — Z794 Long term (current) use of insulin: Secondary | ICD-10-CM | POA: Insufficient documentation

## 2018-03-22 DIAGNOSIS — F17210 Nicotine dependence, cigarettes, uncomplicated: Secondary | ICD-10-CM | POA: Insufficient documentation

## 2018-03-22 LAB — URINALYSIS, MICROSCOPIC (REFLEX): WBC, UA: NONE SEEN WBC/hpf (ref 0–5)

## 2018-03-22 LAB — URINALYSIS, ROUTINE W REFLEX MICROSCOPIC
Bilirubin Urine: NEGATIVE
Ketones, ur: NEGATIVE mg/dL
Leukocytes, UA: NEGATIVE
Nitrite: NEGATIVE
PROTEIN: 100 mg/dL — AB
Specific Gravity, Urine: 1.02 (ref 1.005–1.030)
pH: 6 (ref 5.0–8.0)

## 2018-03-22 MED ORDER — HYDROCODONE-ACETAMINOPHEN 5-325 MG PO TABS: 1.0000 | ORAL_TABLET | Freq: Four times a day (QID) | ORAL | 0 refills | Status: DC | PRN

## 2018-03-22 MED ORDER — HYDROCODONE-ACETAMINOPHEN 5-325 MG PO TABS
2.0000 | ORAL_TABLET | Freq: Once | ORAL | Status: AC
  Administered 2018-03-22: 2 via ORAL
  Filled 2018-03-22: qty 2

## 2018-03-22 MED ORDER — METHOCARBAMOL 500 MG PO TABS: 500.0000 mg | ORAL_TABLET | Freq: Two times a day (BID) | ORAL | 0 refills | Status: DC | PRN

## 2018-03-22 NOTE — ED Notes (Signed)
Pt teaching provided on medications that may cause drowsiness. Pt instructed not to drive or operate heavy machinery while taking the prescribed medication. Pt verbalized understanding.   

## 2018-03-22 NOTE — ED Triage Notes (Signed)
Patient reports that she has left sided flank pain x 1 month. Patient states that she now has swelling to her side - patient states that she is taking tylenol for the pain and it is not helping - patient denies any  Urinary complaints

## 2018-03-22 NOTE — Discharge Instructions (Signed)
If you develop worsening pain in your back or if you develop weakness or numbness in your arms or legs, bowel or bladder incontinence, or any other new/concerning symptoms return to the ER for evaluation.  Otherwise follow-up closely with your primary care physician.

## 2018-03-22 NOTE — ED Notes (Signed)
ED Provider at bedside. 

## 2018-03-22 NOTE — ED Provider Notes (Signed)
[facility] Provider Note   CSN: [id number] Arrival date & time: 03/22/18  1243     History   Chief Complaint Chief Complaint  Patient presents with  . Back Pain    HPI Catherine Espinoza is a 42 y.o. female.  HPI  42 year old female presents with left-sided back pain.  Started the day after sleeping on a sofa bed that was extremely uncomfortable.  Pain is continued and worsened since.  Over the last week her family has point out that she seems swollen on her left flank.  The pain is more medial than the swelling however.  There is no chest pain, shortness of breath, abdominal pain.  No midline pain.  She is tried Tylenol and Biofreeze without relief.  No weakness or numbness in her extremities. No urinary symptoms.  Past Medical History:  Diagnosis Date  . Anemia, unspecified   . Excessive or frequent menstruation   . Morbid obesity ([place])   . Obstructive sleep apnea 02/11/11   Sleep study: severe OSA- rec CPAP 20cm small  full face mask  . Proteinuria   . Type II or unspecified type diabetes mellitus without mention of complication, not stated as uncontrolled   . Unspecified essential hypertension     Patient Active Problem List   Diagnosis Date Noted  . Preventative health care 06/21/2016  . GERD (gastroesophageal reflux disease) 12/25/2014  . Onychomycosis 08/11/2014  . HTN (hypertension) 10/11/2013  . Hidradenitis suppurativa 08/02/2011  . Fatigue 08/02/2011  . Obstructive sleep apnea 02/15/2011  . Hypertension 02/15/2011  . Hyperlipidemia 01/16/2011  . PROTEINURIA 11/16/2009  . Diabetes type 2, uncontrolled ([place]) 11/03/2009  . ANEMIA 11/03/2009  . MORBID OBESITY 10/31/2009  . TOBACCO ABUSE 10/31/2009    Past Surgical History:  Procedure Laterality Date  . ABDOMINAL HYSTERECTOMY    . ABLATION COLPOCLESIS    . CESAREAN SECTION    . CLEFT PALATE REPAIR    . cyst removal    . ECTOPIC PREGNANCY SURGERY     x 2  . OOPHORECTOMY  Right 2002     OB History   None      Home Medications    Prior to Admission medications   Medication Sig Start Date End Date Taking? Authorizing Provider  albuterol (PROVENTIL HFA;VENTOLIN HFA) 108 (90 Base) MCG/ACT inhaler Inhale 2 puffs into the lungs every 6 (six) hours as needed for wheezing or shortness of breath. 10/01/16   [name], NP  amLODipine (NORVASC) 10 MG tablet Take 1 tablet (10 mg total) by mouth daily. 11/08/16   [name], NP  atorvastatin (LIPITOR) 40 MG tablet TAKE 1 TABLET (40 MG TOTAL) BY MOUTH DAILY. 02/11/17   [name], NP  BAYER MICROLET LANCETS lancets Use as instructed to check blood sugar three times daily.  DX  E11.65 10/24/17   [name], NP  beclomethasone (QVAR) 80 MCG/ACT inhaler 4 puffs twice daily for next 3 days, then 2 puffs twice daily 01/05/18   [name], NP  cephALEXin (KEFLEX) 500 MG capsule Take 1 capsule (500 mg total) by mouth 3 (three) times daily. 02/27/18   [name], NP  glucose blood (BAYER CONTOUR TEST) test strip Use to check blood sugar 3 times per day.  DX  E11.65 10/24/17   [name], NP  guaiFENesin-codeine (CHERATUSSIN AC) 100-10 MG/5ML syrup Take 5 mLs by mouth 3 (three) times daily as needed for cough. 01/05/18   [name], NP  HYDROcodone-acetaminophen (NORCO) 5-325 MG tablet Take  1 tablet by mouth every 6 (six) hours as needed for severe pain. 03/22/18   Sherwood Gambler, MD  Insulin Glargine (LANTUS SOLOSTAR) 100 UNIT/ML Solostar Pen Inject 55 Units into the skin every morning. And pen needles 1/day 02/04/18   Renato Shin, MD  lisinopril (PRINIVIL,ZESTRIL) 20 MG tablet Take 1 tablet (20 mg total) by mouth daily. 08/19/16   [name], NP  methocarbamol (ROBAXIN) 500 MG tablet Take 1 tablet (500 mg total) by mouth 2 (two) times daily as needed for muscle spasms. 03/22/18   Sherwood Gambler, MD  pantoprazole (PROTONIX) 40 MG tablet Take 1 tablet (40 mg  total) by mouth daily. 01/05/18   [name], NP  traMADol (ULTRAM) 50 MG tablet Take 1 tablet (50 mg total) by mouth every 8 (eight) hours as needed. 02/27/18   [name], NP  valACYclovir (VALTREX) 1000 MG tablet Take 1 tablet by mouth once daily for 5 days at start of rash 02/20/18   [name], NP    Family History Family History  Problem Relation Age of Onset  . Heart attack Maternal Aunt   . Diabetes Mother   . Cancer Father        oral cancer    Social History Social History   Tobacco Use  . Smoking status: Current Every Day Smoker    Packs/day: 0.50    Years: 10.00    Pack years: 5.00    Types: Cigarettes  . Smokeless tobacco: Never Used  Substance Use Topics  . Alcohol use: Yes    Alcohol/week: 0.0 oz    Comment: occasional use  . Drug use: No     Allergies   Ibuprofen; Labetalol; and Aspirin   Review of Systems Review of Systems  Respiratory: Negative for shortness of breath.   Cardiovascular: Negative for chest pain.  Gastrointestinal: Negative for abdominal pain.  Genitourinary: Negative for dysuria and hematuria.  Musculoskeletal: Positive for back pain.  Neurological: Negative for weakness and numbness.  All other systems reviewed and are negative.    Physical Exam Updated Vital Signs BP (!) 158/114 (BP Location: Left Arm)   Pulse (!) 105   Temp 98.4 F (36.9 C) (Oral)   Resp 18   Ht 5\' 5"  (1.651 m)   Wt 129.3 kg (285 lb)   LMP 09/13/2011   SpO2 97%   BMI 47.43 kg/m   Physical Exam  Constitutional: She is oriented to person, place, and time. She appears well-developed and well-nourished. No distress.  obese  HENT:  Head: Normocephalic and atraumatic.  Right Ear: External ear normal.  Left Ear: External ear normal.  Nose: Nose normal.  Eyes: Right eye exhibits no discharge. Left eye exhibits no discharge.  Cardiovascular: Normal rate, regular rhythm and normal heart sounds.  Pulmonary/Chest: Effort normal and  breath sounds normal.  Abdominal: Soft. There is no tenderness.  Musculoskeletal:       Cervical back: She exhibits no tenderness and no bony tenderness.       Thoracic back: She exhibits tenderness. She exhibits no bony tenderness.       Lumbar back: She exhibits no bony tenderness.       Back:  Point tenderness to left medial back, not in midline. Over lower ribs.  Neurological: She is alert and oriented to person, place, and time.  5/5 strength in BLE. Normal gross sensation  Skin: Skin is warm and dry. She is not diaphoretic.  Nursing note and vitals reviewed.    ED Treatments /  Results  Labs (all labs ordered are listed, but only abnormal results are displayed) Labs Reviewed  URINALYSIS, ROUTINE W REFLEX MICROSCOPIC - Abnormal; Notable for the following components:      Result Value   Glucose, UA >=500 (*)    Hgb urine dipstick SMALL (*)    Protein, ur 100 (*)    All other components within normal limits  URINALYSIS, MICROSCOPIC (REFLEX) - Abnormal; Notable for the following components:   Bacteria, UA FEW (*)    All other components within normal limits    EKG None  Radiology Dg Ribs Unilateral W/chest Left  Result Date: 03/22/2018 CLINICAL DATA:  Left rib pain without known injury. EXAM: LEFT RIBS AND CHEST - 3+ VIEW COMPARISON:  Radiographs of October 01, 2016. FINDINGS: No fracture or other bone lesions are seen involving the ribs. There is no evidence of pneumothorax or pleural effusion. Both lungs are clear. Heart size and mediastinal contours are within normal limits. IMPRESSION: Normal left ribs.  No acute cardiopulmonary abnormality seen. Electronically Signed   By: Marijo Conception, M.D.   On: 03/22/2018 14:43   Dg Thoracic Spine W/swimmers  Result Date: 03/22/2018 CLINICAL DATA:  Upper back pain for 1 month without known injury. EXAM: THORACIC SPINE - 3 VIEWS COMPARISON:  Radiographs of October 01, 2016. FINDINGS: There is no evidence of thoracic spine fracture.  Alignment is normal. No other significant bone abnormalities are identified. IMPRESSION: Normal thoracic spine. Electronically Signed   By: Marijo Conception, M.D.   On: 03/22/2018 14:44    Procedures Procedures (including critical care time)  Medications Ordered in ED Medications  HYDROcodone-acetaminophen (NORCO/VICODIN) 5-325 MG per tablet 2 tablet (2 tablets Oral Given 03/22/18 1339)     Initial Impression / Assessment and Plan / ED Course  I have reviewed the triage vital signs and the nursing notes.  Pertinent labs & imaging results that were available during my care of the patient were reviewed by me and considered in my medical decision making (see chart for details).     Patient's tenderness is mostly in the left medial, inferior ribs near her thoracic back.  She has a soft tissue swelling lateral to this that seems consistent with a likely lipoma.  There is no skin changes.  The soft tissue swelling is actually not even tender.  I discussed pain control with the patient.  She has no focal neurologic findings or concern for spinal cord emergency.  This is higher than CVA pain and I doubt renal colic or other acute intra-abdominal/retroperitoneal emergency.  Discussed pain control and follow-up with PCP.  Return precautions.  Final Clinical Impressions(s) / ED Diagnoses   Final diagnoses:  Acute left-sided thoracic back pain  Lipoma of torso    ED Discharge Orders        Ordered    HYDROcodone-acetaminophen (NORCO) 5-325 MG tablet  Every 6 hours PRN     03/22/18 1503    methocarbamol (ROBAXIN) 500 MG tablet  2 times daily PRN     03/22/18 1503       Sherwood Gambler, MD 03/22/18 1507

## 2018-04-15 ENCOUNTER — Ambulatory Visit: Payer: BLUE CROSS/BLUE SHIELD | Admitting: Endocrinology

## 2018-04-15 DIAGNOSIS — Z0289 Encounter for other administrative examinations: Secondary | ICD-10-CM

## 2018-05-09 ENCOUNTER — Other Ambulatory Visit: Payer: Self-pay

## 2018-05-09 ENCOUNTER — Encounter (HOSPITAL_BASED_OUTPATIENT_CLINIC_OR_DEPARTMENT_OTHER): Payer: Self-pay | Admitting: Emergency Medicine

## 2018-05-09 ENCOUNTER — Emergency Department (HOSPITAL_BASED_OUTPATIENT_CLINIC_OR_DEPARTMENT_OTHER)
Admission: EM | Admit: 2018-05-09 | Discharge: 2018-05-10 | Disposition: A | Discharge status: Home / Self Care | Payer: Medicaid Other | Attending: Emergency Medicine | Admitting: Emergency Medicine

## 2018-05-09 ENCOUNTER — Emergency Department (HOSPITAL_BASED_OUTPATIENT_CLINIC_OR_DEPARTMENT_OTHER): Payer: Medicaid Other

## 2018-05-09 DIAGNOSIS — Z79899 Other long term (current) drug therapy: Secondary | ICD-10-CM | POA: Insufficient documentation

## 2018-05-09 DIAGNOSIS — L732 Hidradenitis suppurativa: Secondary | ICD-10-CM

## 2018-05-09 DIAGNOSIS — I10 Essential (primary) hypertension: Secondary | ICD-10-CM | POA: Insufficient documentation

## 2018-05-09 DIAGNOSIS — B3731 Acute candidiasis of vulva and vagina: Secondary | ICD-10-CM

## 2018-05-09 DIAGNOSIS — Z794 Long term (current) use of insulin: Secondary | ICD-10-CM | POA: Insufficient documentation

## 2018-05-09 DIAGNOSIS — E1165 Type 2 diabetes mellitus with hyperglycemia: Secondary | ICD-10-CM | POA: Insufficient documentation

## 2018-05-09 DIAGNOSIS — B373 Candidiasis of vulva and vagina: Secondary | ICD-10-CM

## 2018-05-09 DIAGNOSIS — F17210 Nicotine dependence, cigarettes, uncomplicated: Secondary | ICD-10-CM | POA: Insufficient documentation

## 2018-05-09 DIAGNOSIS — L02214 Cutaneous abscess of groin: Secondary | ICD-10-CM

## 2018-05-09 LAB — CBC WITH DIFFERENTIAL/PLATELET
Basophils Absolute: 0 10*3/uL (ref 0.0–0.1)
Basophils Relative: 0 %
EOS ABS: 0.2 10*3/uL (ref 0.0–0.7)
EOS PCT: 2 %
HCT: 45.6 % (ref 36.0–46.0)
Hemoglobin: 15.7 g/dL — ABNORMAL HIGH (ref 12.0–15.0)
Lymphocytes Relative: 17 %
Lymphs Abs: 2.1 10*3/uL (ref 0.7–4.0)
MCH: 29.8 pg (ref 26.0–34.0)
MCHC: 34.4 g/dL (ref 30.0–36.0)
MCV: 86.5 fL (ref 78.0–100.0)
MONO ABS: 1.1 10*3/uL — AB (ref 0.1–1.0)
MONOS PCT: 8 %
Neutro Abs: 9.5 10*3/uL — ABNORMAL HIGH (ref 1.7–7.7)
Neutrophils Relative %: 73 %
PLATELETS: 265 10*3/uL (ref 150–400)
RBC: 5.27 MIL/uL — ABNORMAL HIGH (ref 3.87–5.11)
RDW: 12.7 % (ref 11.5–15.5)
WBC: 12.9 10*3/uL — ABNORMAL HIGH (ref 4.0–10.5)

## 2018-05-09 LAB — COMPREHENSIVE METABOLIC PANEL
ALBUMIN: 2.8 g/dL — AB (ref 3.5–5.0)
ALK PHOS: 93 U/L (ref 38–126)
ALT: 20 U/L (ref 0–44)
AST: 16 U/L (ref 15–41)
Anion gap: 10 (ref 5–15)
BILIRUBIN TOTAL: 0.4 mg/dL (ref 0.3–1.2)
BUN: 10 mg/dL (ref 6–20)
CALCIUM: 8.5 mg/dL — AB (ref 8.9–10.3)
CO2: 25 mmol/L (ref 22–32)
CREATININE: 0.73 mg/dL (ref 0.44–1.00)
Chloride: 94 mmol/L — ABNORMAL LOW (ref 98–111)
GFR calc Af Amer: >60 mL/min (ref >60)
GFR calc non Af Amer: >60 mL/min (ref >60)
GLUCOSE: 489 mg/dL — AB (ref 70–99)
Potassium: 3.5 mmol/L (ref 3.5–5.1)
SODIUM: 129 mmol/L — AB (ref 135–145)
Total Protein: 7.8 g/dL (ref 6.5–8.1)

## 2018-05-09 LAB — I-STAT CG4 LACTIC ACID, ED: Lactic Acid, Venous: 1.37 mmol/L (ref 0.5–1.9)

## 2018-05-09 LAB — URINALYSIS, MICROSCOPIC (REFLEX)

## 2018-05-09 LAB — URINALYSIS, ROUTINE W REFLEX MICROSCOPIC
BILIRUBIN URINE: NEGATIVE
Glucose, UA: >=500 mg/dL — AB
Ketones, ur: NEGATIVE mg/dL
Leukocytes, UA: NEGATIVE
Nitrite: NEGATIVE
Protein, ur: 30 mg/dL — AB
SPECIFIC GRAVITY, URINE: 1.01 (ref 1.005–1.030)
pH: 5.5 (ref 5.0–8.0)

## 2018-05-09 LAB — CBG MONITORING, ED: Glucose-Capillary: 219 mg/dL — ABNORMAL HIGH (ref 70–99)

## 2018-05-09 MED ORDER — HYDROCODONE-ACETAMINOPHEN 5-325 MG PO TABS: 1.0000 | ORAL_TABLET | ORAL | 0 refills | Status: DC | PRN

## 2018-05-09 MED ORDER — SODIUM CHLORIDE 0.9 % IV SOLN: INTRAVENOUS | Status: DC | PRN

## 2018-05-09 MED ORDER — ONDANSETRON HCL 4 MG/2ML IJ SOLN
4.0000 mg | Freq: Once | INTRAMUSCULAR | Status: AC
  Administered 2018-05-09: 4 mg via INTRAVENOUS
  Filled 2018-05-09: qty 2

## 2018-05-09 MED ORDER — PENTAFLUOROPROP-TETRAFLUOROETH EX AERO
INHALATION_SPRAY | CUTANEOUS | Status: DC | PRN
  Administered 2018-05-09: 30 via TOPICAL

## 2018-05-09 MED ORDER — INSULIN REGULAR HUMAN 100 UNIT/ML IJ SOLN
15.0000 [IU] | Freq: Once | INTRAMUSCULAR | Status: AC
  Administered 2018-05-09: 15 [IU] via INTRAVENOUS
  Filled 2018-05-09: qty 1

## 2018-05-09 MED ORDER — PENTAFLUOROPROP-TETRAFLUOROETH EX AERO
INHALATION_SPRAY | CUTANEOUS | Status: AC
  Administered 2018-05-09: 30 via TOPICAL
  Filled 2018-05-09: qty 30

## 2018-05-09 MED ORDER — LIDOCAINE-EPINEPHRINE (PF) 2 %-1:200000 IJ SOLN
10.0000 mL | Freq: Once | INTRAMUSCULAR | Status: AC
  Administered 2018-05-09: 10 mL
  Filled 2018-05-09 (×2): qty 10

## 2018-05-09 MED ORDER — SODIUM CHLORIDE 0.9 % IV BOLUS
1000.0000 mL | Freq: Once | INTRAVENOUS | Status: AC
  Administered 2018-05-09: 1000 mL via INTRAVENOUS

## 2018-05-09 MED ORDER — VANCOMYCIN HCL IN DEXTROSE 1-5 GM/200ML-% IV SOLN
INTRAVENOUS | Status: AC
  Filled 2018-05-09: qty 200

## 2018-05-09 MED ORDER — MORPHINE SULFATE (PF) 4 MG/ML IV SOLN
4.0000 mg | Freq: Once | INTRAVENOUS | Status: AC
  Administered 2018-05-09: 4 mg via INTRAVENOUS
  Filled 2018-05-09: qty 1

## 2018-05-09 MED ORDER — HYDROMORPHONE HCL 1 MG/ML IJ SOLN
1.0000 mg | Freq: Once | INTRAMUSCULAR | Status: AC
  Administered 2018-05-09: 1 mg via INTRAVENOUS
  Filled 2018-05-09: qty 1

## 2018-05-09 MED ORDER — FLUCONAZOLE 50 MG PO TABS
150.0000 mg | ORAL_TABLET | Freq: Once | ORAL | Status: AC
  Administered 2018-05-09: 150 mg via ORAL
  Filled 2018-05-09: qty 1

## 2018-05-09 MED ORDER — DOXYCYCLINE HYCLATE 100 MG PO CAPS: 100.0000 mg | ORAL_CAPSULE | Freq: Two times a day (BID) | ORAL | 0 refills | Status: DC

## 2018-05-09 MED ORDER — VANCOMYCIN HCL 10 G IV SOLR
1500.0000 mg | Freq: Once | INTRAVENOUS | Status: AC
  Administered 2018-05-09: 1500 mg via INTRAVENOUS
  Filled 2018-05-09: qty 1500

## 2018-05-09 MED ORDER — CLINDAMYCIN PHOSPHATE 1 % EX SOLN: Freq: Two times a day (BID) | CUTANEOUS | 0 refills | Status: DC

## 2018-05-09 MED ORDER — LORAZEPAM 2 MG/ML IJ SOLN
1.0000 mg | Freq: Once | INTRAMUSCULAR | Status: AC
  Administered 2018-05-09: 1 mg via INTRAVENOUS
  Filled 2018-05-09: qty 1

## 2018-05-09 NOTE — ED Provider Notes (Signed)
[facility] Provider Note   CSN: [id number] Arrival date & time: 05/09/18  1853     History   Chief Complaint Chief Complaint  Patient presents with  . Abscess    possible sepsis     HPI Catherine Espinoza is a 42 y.o. female.  Pt presents to the ED today with an abscess to her groin.  Pt has a hx of hidradenitis suppurativa and has had many groin abscesses.  Pt also has a hx of DM which has not been well controlled.  Pt said the abscess has been there for 2 weeks.  No fevers at home.     Past Medical History:  Diagnosis Date  . Anemia, unspecified   . Excessive or frequent menstruation   . Morbid obesity ([place])   . Obstructive sleep apnea 02/11/11   Sleep study: severe OSA- rec CPAP 20cm small  full face mask  . Proteinuria   . Type II or unspecified type diabetes mellitus without mention of complication, not stated as uncontrolled   . Unspecified essential hypertension     Patient Active Problem List   Diagnosis Date Noted  . Preventative health care 06/21/2016  . GERD (gastroesophageal reflux disease) 12/25/2014  . Onychomycosis 08/11/2014  . HTN (hypertension) 10/11/2013  . Hidradenitis suppurativa 08/02/2011  . Fatigue 08/02/2011  . Obstructive sleep apnea 02/15/2011  . Hypertension 02/15/2011  . Hyperlipidemia 01/16/2011  . PROTEINURIA 11/16/2009  . Diabetes type 2, uncontrolled ([place]) 11/03/2009  . ANEMIA 11/03/2009  . MORBID OBESITY 10/31/2009  . TOBACCO ABUSE 10/31/2009    Past Surgical History:  Procedure Laterality Date  . ABDOMINAL HYSTERECTOMY    . ABLATION COLPOCLESIS    . CESAREAN SECTION    . CLEFT PALATE REPAIR    . cyst removal    . ECTOPIC PREGNANCY SURGERY     x 2  . OOPHORECTOMY Right 2002     OB History   None      Home Medications    Prior to Admission medications   Medication Sig Start Date End Date Taking? Authorizing Provider  albuterol (PROVENTIL HFA;VENTOLIN HFA) 108 (90 Base) MCG/ACT  inhaler Inhale 2 puffs into the lungs every 6 (six) hours as needed for wheezing or shortness of breath. 10/01/16   [name], NP  amLODipine (NORVASC) 10 MG tablet Take 1 tablet (10 mg total) by mouth daily. 11/08/16   [name], NP  atorvastatin (LIPITOR) 40 MG tablet TAKE 1 TABLET (40 MG TOTAL) BY MOUTH DAILY. 02/11/17   [name], NP  BAYER MICROLET LANCETS lancets Use as instructed to check blood sugar three times daily.  DX  E11.65 10/24/17   [name], NP  beclomethasone (QVAR) 80 MCG/ACT inhaler 4 puffs twice daily for next 3 days, then 2 puffs twice daily 01/05/18   [name], NP  cephALEXin (KEFLEX) 500 MG capsule Take 1 capsule (500 mg total) by mouth 3 (three) times daily. 02/27/18   [name], NP  clindamycin (CLEOCIN-T) 1 % external solution Apply topically 2 (two) times daily. 05/10/18   Molpus, John, MD  doxycycline (VIBRAMYCIN) 100 MG capsule Take 1 capsule (100 mg total) by mouth 2 (two) times daily. 05/10/18   Molpus, John, MD  glucose blood (BAYER CONTOUR TEST) test strip Use to check blood sugar 3 times per day.  DX  E11.65 10/24/17   [name], NP  guaiFENesin-codeine (CHERATUSSIN AC) 100-10 MG/5ML syrup Take 5 mLs by mouth 3 (three) times daily as needed for  cough. 01/05/18   [name], NP  HYDROcodone-acetaminophen (NORCO/VICODIN) 5-325 MG tablet Take 1 tablet by mouth every 4 (four) hours as needed. 05/10/18   Molpus, John, MD  Insulin Glargine (LANTUS SOLOSTAR) 100 UNIT/ML Solostar Pen Inject 55 Units into the skin every morning. And pen needles 1/day 02/04/18   Renato Shin, MD  lisinopril (PRINIVIL,ZESTRIL) 20 MG tablet Take 1 tablet (20 mg total) by mouth daily. 08/19/16   [name], NP  methocarbamol (ROBAXIN) 500 MG tablet Take 1 tablet (500 mg total) by mouth 2 (two) times daily as needed for muscle spasms. 03/22/18   Sherwood Gambler, MD  pantoprazole (PROTONIX) 40 MG tablet Take 1 tablet (40 mg  total) by mouth daily. 01/05/18   [name], NP  traMADol (ULTRAM) 50 MG tablet Take 1 tablet (50 mg total) by mouth every 8 (eight) hours as needed. 02/27/18   [name], NP  valACYclovir (VALTREX) 1000 MG tablet Take 1 tablet by mouth once daily for 5 days at start of rash 02/20/18   [name], NP    Family History Family History  Problem Relation Age of Onset  . Heart attack Maternal Aunt   . Diabetes Mother   . Cancer Father        oral cancer    Social History Social History   Tobacco Use  . Smoking status: Current Every Day Smoker    Packs/day: 0.50    Years: 10.00    Pack years: 5.00    Types: Cigarettes  . Smokeless tobacco: Never Used  Substance Use Topics  . Alcohol use: Yes    Alcohol/week: 0.0 standard drinks    Comment: occasional use  . Drug use: No     Allergies   Ibuprofen; Labetalol; and Aspirin   Review of Systems Review of Systems  Skin:       abscess  All other systems reviewed and are negative.    Physical Exam Updated Vital Signs BP 122/67 (BP Location: Left Arm)   Pulse (!) 120   Temp 98.6 F (37 C) (Oral)   Resp 18   Ht 5\' 8"  (1.727 m)   Wt 129.3 kg   LMP 09/13/2011   SpO2 94%   BMI 43.34 kg/m   Physical Exam  Constitutional: She is oriented to person, place, and time. She appears well-developed and well-nourished. She appears distressed.  HENT:  Head: Normocephalic and atraumatic.  Right Ear: External ear normal.  Left Ear: External ear normal.  Nose: Nose normal.  Mouth/Throat: Oropharynx is clear and moist.  Eyes: Pupils are equal, round, and reactive to light. Conjunctivae and EOM are normal.  Neck: Normal range of motion. Neck supple.  Cardiovascular: Regular rhythm, normal heart sounds and intact distal pulses. Tachycardia present.  Pulmonary/Chest: Effort normal and breath sounds normal.  Abdominal: Soft. Bowel sounds are normal.  Genitourinary:  Genitourinary Comments: Large abscess to  left upper, medial thigh  Musculoskeletal: Normal range of motion.  Neurological: She is alert and oriented to person, place, and time.  Skin: Skin is warm. Capillary refill takes less than 2 seconds.  Psychiatric: She has a normal mood and affect. Her behavior is normal. Thought content normal.  Nursing note and vitals reviewed.    ED Treatments / Results  Labs (all labs ordered are listed, but only abnormal results are displayed) Labs Reviewed  COMPREHENSIVE METABOLIC PANEL - Abnormal; Notable for the following components:      Result Value   Sodium 129 (*)  Chloride 94 (*)    Glucose, Bld 489 (*)    Calcium 8.5 (*)    Albumin 2.8 (*)    All other components within normal limits  CBC WITH DIFFERENTIAL/PLATELET - Abnormal; Notable for the following components:   WBC 12.9 (*)    RBC 5.27 (*)    Hemoglobin 15.7 (*)    Neutro Abs 9.5 (*)    Monocytes Absolute 1.1 (*)    All other components within normal limits  URINALYSIS, ROUTINE W REFLEX MICROSCOPIC - Abnormal; Notable for the following components:   APPearance HAZY (*)    Glucose, UA >=500 (*)    Hgb urine dipstick TRACE (*)    Protein, ur 30 (*)    All other components within normal limits  URINALYSIS, MICROSCOPIC (REFLEX) - Abnormal; Notable for the following components:   Bacteria, UA FEW (*)    All other components within normal limits  CBG MONITORING, ED - Abnormal; Notable for the following components:   Glucose-Capillary 219 (*)    All other components within normal limits  I-STAT CG4 LACTIC ACID, ED    EKG None  Radiology Dg Chest 2 View  Result Date: 05/09/2018 CLINICAL DATA:  Sepsis EXAM: CHEST - 2 VIEW COMPARISON:  March 22, 2018 FINDINGS: The heart size and mediastinal contours are within normal limits. Both lungs are clear. The visualized skeletal structures are unremarkable. IMPRESSION: No active cardiopulmonary disease. Electronically Signed   By: Abelardo Diesel M.D.   On: 05/09/2018 20:24     Procedures .Marland KitchenIncision and Drainage Date/Time: 05/09/2018 10:40 PM Performed by: Isla Pence, MD Authorized by: Isla Pence, MD   Consent:    Consent obtained:  Verbal   Consent given by:  Patient   Risks discussed:  Bleeding, incomplete drainage and pain   Alternatives discussed:  No treatment Location:    Type:  Abscess   Size:  10 by 10   Location:  Lower extremity   Lower extremity location:  Leg   Leg location:  L upper leg Pre-procedure details:    Skin preparation:  Betadine Anesthesia (see MAR for exact dosages):    Anesthesia method:  Local infiltration   Local anesthetic:  Lidocaine 2% WITH epi Procedure type:    Complexity:  Simple Procedure details:    Incision types:  Cruciate   Scalpel blade:  11   Wound management:  Probed and deloculated   Drainage:  Bloody and purulent   Drainage amount:  Scant   Wound treatment:  Wound left open   Packing materials:  None Post-procedure details:    Patient tolerance of procedure:  Tolerated well, no immediate complications   (including critical care time)  Medications Ordered in ED Medications  sodium chloride 0.9 % bolus 1,000 mL (0 mLs Intravenous Stopped 05/09/18 2225)  morphine 4 MG/ML injection 4 mg (4 mg Intravenous Given 05/09/18 2129)  ondansetron (ZOFRAN) injection 4 mg (4 mg Intravenous Given 05/09/18 2129)  insulin regular (NOVOLIN R,HUMULIN R) 100 units/mL injection 15 Units (15 Units Intravenous Given 05/09/18 2130)  lidocaine-EPINEPHrine (XYLOCAINE W/EPI) 2 %-1:200000 (PF) injection 10 mL (10 mLs Infiltration Given 05/09/18 2130)  HYDROmorphone (DILAUDID) injection 1 mg (1 mg Intravenous Given 05/09/18 2209)  LORazepam (ATIVAN) injection 1 mg (1 mg Intravenous Given 05/09/18 2208)  vancomycin (VANCOCIN) 1,500 mg in sodium chloride 0.9 % 500 mL IVPB (0 mg Intravenous Stopped 05/10/18 0031)  fluconazole (DIFLUCAN) tablet 150 mg (150 mg Oral Given 05/09/18 2305)     Initial Impression / Assessment  and  Plan / ED Course  I have reviewed the triage vital signs and the nursing notes.  Pertinent labs & imaging results that were available during my care of the patient were reviewed by me and considered in my medical decision making (see chart for details).   While I was doing the I&d on pt's groin, I noted she had what appears to be a yeast infection.  Pt will be given a dose of diflucan.    BS is better after IV insulin and fluids.  Pt given a dose of vancomycin here.  She will be d/c home with instructions to return if worse.  F/u with pcp.   Final Clinical Impressions(s) / ED Diagnoses   Final diagnoses:  Hidradenitis suppurativa  Abscess of groin, left  Poorly controlled type 2 diabetes mellitus ([place])  Vaginal candidiasis    ED Discharge Orders         Ordered    clindamycin (CLEOCIN-T) 1 % external solution  2 times daily     05/10/18 0028    doxycycline (VIBRAMYCIN) 100 MG capsule  2 times daily     05/10/18 0028    HYDROcodone-acetaminophen (NORCO/VICODIN) 5-325 MG tablet  Every 4 hours PRN     05/10/18 0028    HYDROcodone-acetaminophen (NORCO/VICODIN) 5-325 MG tablet  Every 4 hours PRN,   Status:  Discontinued     05/09/18 2245    doxycycline (VIBRAMYCIN) 100 MG capsule  2 times daily,   Status:  Discontinued     05/09/18 2245    clindamycin (CLEOCIN-T) 1 % external solution  2 times daily,   Status:  Discontinued     05/09/18 2245           Isla Pence, MD 05/10/18 1755

## 2018-05-09 NOTE — ED Triage Notes (Signed)
Patient states that she has had an abscess to the area in between her legs x 2 weeks - patient is crying in triage

## 2018-05-09 NOTE — ED Notes (Signed)
Pt placed on O2 2 L.

## 2018-05-09 NOTE — ED Notes (Signed)
RN drawing labs 

## 2018-05-10 MED ORDER — DOXYCYCLINE HYCLATE 100 MG PO CAPS: 100.0000 mg | ORAL_CAPSULE | Freq: Two times a day (BID) | ORAL | 0 refills | Status: DC

## 2018-05-10 MED ORDER — CLINDAMYCIN PHOSPHATE 1 % EX SOLN: Freq: Two times a day (BID) | CUTANEOUS | 0 refills | Status: DC

## 2018-05-10 MED ORDER — HYDROCODONE-ACETAMINOPHEN 5-325 MG PO TABS: 1.0000 | ORAL_TABLET | ORAL | 0 refills | Status: DC | PRN

## 2018-05-11 ENCOUNTER — Encounter (HOSPITAL_BASED_OUTPATIENT_CLINIC_OR_DEPARTMENT_OTHER): Payer: Self-pay | Admitting: Emergency Medicine

## 2018-05-11 ENCOUNTER — Other Ambulatory Visit: Payer: Self-pay

## 2018-05-11 ENCOUNTER — Emergency Department (HOSPITAL_BASED_OUTPATIENT_CLINIC_OR_DEPARTMENT_OTHER)
Admission: EM | Admit: 2018-05-11 | Discharge: 2018-05-11 | Disposition: A | Discharge status: Home / Self Care | Payer: Medicaid Other | Attending: Emergency Medicine | Admitting: Emergency Medicine

## 2018-05-11 DIAGNOSIS — Z5321 Procedure and treatment not carried out due to patient leaving prior to being seen by health care provider: Secondary | ICD-10-CM | POA: Insufficient documentation

## 2018-05-11 DIAGNOSIS — L0291 Cutaneous abscess, unspecified: Secondary | ICD-10-CM | POA: Insufficient documentation

## 2018-05-11 MED FILL — HYDROCODON-APAP 5-325: 5-325 | 2 days supply | Qty: 10 | Fill #0

## 2018-05-11 NOTE — ED Notes (Signed)
Pt. Left reporting she has a scheduled appt. With her Primary Care MD

## 2018-05-11 NOTE — ED Triage Notes (Signed)
Patient states that she continues to have pain to the abscess that she has in between her legs that she has opened 2 days ago

## 2018-05-12 ENCOUNTER — Encounter (HOSPITAL_BASED_OUTPATIENT_CLINIC_OR_DEPARTMENT_OTHER): Payer: Self-pay | Admitting: Emergency Medicine

## 2018-05-12 ENCOUNTER — Ambulatory Visit: Payer: Medicaid Other | Admitting: Family

## 2018-05-12 ENCOUNTER — Other Ambulatory Visit: Payer: Self-pay

## 2018-05-12 ENCOUNTER — Emergency Department (HOSPITAL_BASED_OUTPATIENT_CLINIC_OR_DEPARTMENT_OTHER)
Admission: EM | Admit: 2018-05-12 | Discharge: 2018-05-12 | Disposition: A | Discharge status: Home / Self Care | Payer: Medicaid Other | Attending: Emergency Medicine | Admitting: Emergency Medicine

## 2018-05-12 ENCOUNTER — Telehealth: Payer: Self-pay

## 2018-05-12 DIAGNOSIS — E119 Type 2 diabetes mellitus without complications: Secondary | ICD-10-CM | POA: Insufficient documentation

## 2018-05-12 DIAGNOSIS — F17210 Nicotine dependence, cigarettes, uncomplicated: Secondary | ICD-10-CM | POA: Insufficient documentation

## 2018-05-12 DIAGNOSIS — Z794 Long term (current) use of insulin: Secondary | ICD-10-CM | POA: Insufficient documentation

## 2018-05-12 DIAGNOSIS — I10 Essential (primary) hypertension: Secondary | ICD-10-CM | POA: Insufficient documentation

## 2018-05-12 DIAGNOSIS — L02416 Cutaneous abscess of left lower limb: Secondary | ICD-10-CM | POA: Insufficient documentation

## 2018-05-12 DIAGNOSIS — E785 Hyperlipidemia, unspecified: Secondary | ICD-10-CM | POA: Insufficient documentation

## 2018-05-12 DIAGNOSIS — Z79899 Other long term (current) drug therapy: Secondary | ICD-10-CM | POA: Insufficient documentation

## 2018-05-12 MED ORDER — LIDOCAINE-EPINEPHRINE (PF) 2 %-1:200000 IJ SOLN
INTRAMUSCULAR | Status: AC
  Filled 2018-05-12: qty 20

## 2018-05-12 MED ORDER — MORPHINE SULFATE (PF) 2 MG/ML IV SOLN
2.0000 mg | Freq: Once | INTRAVENOUS | Status: AC
  Administered 2018-05-12: 2 mg via INTRAVENOUS
  Filled 2018-05-12: qty 1

## 2018-05-12 MED ORDER — DIPHENHYDRAMINE HCL 50 MG/ML IJ SOLN
25.0000 mg | Freq: Once | INTRAMUSCULAR | Status: AC
  Administered 2018-05-12: 25 mg via INTRAVENOUS
  Filled 2018-05-12: qty 1

## 2018-05-12 MED ORDER — LIDOCAINE-EPINEPHRINE (PF) 2 %-1:200000 IJ SOLN
20.0000 mL | Freq: Once | INTRAMUSCULAR | Status: AC
  Administered 2018-05-12: 20 mL
  Filled 2018-05-12: qty 20

## 2018-05-12 MED ORDER — ONDANSETRON HCL 4 MG/2ML IJ SOLN
4.0000 mg | Freq: Once | INTRAMUSCULAR | Status: AC
  Administered 2018-05-12: 4 mg via INTRAVENOUS
  Filled 2018-05-12: qty 2

## 2018-05-12 MED ORDER — HYDROCODONE-ACETAMINOPHEN 5-325 MG PO TABS
1.0000 | ORAL_TABLET | ORAL | 0 refills | Status: AC | PRN
Start: 2018-05-12 — End: 2018-05-15

## 2018-05-12 MED ORDER — HYDROCODONE-ACETAMINOPHEN 5-325 MG PO TABS: 1.0000 | ORAL_TABLET | Freq: Once | ORAL | Status: DC

## 2018-05-12 MED ORDER — ONDANSETRON 4 MG PO TBDP: 4.0000 mg | ORAL_TABLET | Freq: Once | ORAL | Status: DC

## 2018-05-12 NOTE — Telephone Encounter (Signed)
Copied from [place] [phone]. Topic: General - Other >> May 12, 2018  9:27 AM Oneta Rack wrote: Relation to pt: self  Call back number: [phone]   Reason for call:  Patient states she went to the ED yesterday but was never seen for boil on left inner thigh, patient states she's in a lot of pain and she's currently on her way back to the ED.

## 2018-05-12 NOTE — ED Notes (Signed)
Pt. Fussing at time of discharge.  Pt. Said she had called out on the call bell with no one coming to the room.  Explained to Pt. That I was the RN for her and if she was upset to be upset at me and no one else but I was in another Pt. Room taking care of them.  Pt. Michela Pitcher it was ok she just wanted her IV out and she wanted it out now.  RN apologized to Pt. For taking too long to do what she needed and took care of things.

## 2018-05-12 NOTE — ED Provider Notes (Signed)
[facility] DEPARTMENT Provider Note  CSN: [id number] Arrival date & time: 05/12/18  1047    History   Chief Complaint Chief Complaint  Patient presents with  . Abscess    HPI Catherine Espinoza is a 42 y.o. female with a medical history of Type 2 DM, HTN and hidradenitis suppurativa who presented to the ED for lower extremity abscess x4 days. Patient describes 2 abscesses on the back of her left thigh. She reports getting recurrent abscess due to her HS. Endorses severe 10/10 pain. Unable to see the site to her body habitus.   Additional history obtained by medical chart. Patient had I&D performed in the ED on 05/09/18 on an inner thigh abscess. She states that the abscess she presents with today is in a different location. She reports taking the doxycycline which was prescribed on 05/09/18 consistently.  Past Medical History:  Diagnosis Date  . Anemia, unspecified   . Excessive or frequent menstruation   . Morbid obesity ([place])   . Obstructive sleep apnea 02/11/11   Sleep study: severe OSA- rec CPAP 20cm small  full face mask  . Proteinuria   . Type II or unspecified type diabetes mellitus without mention of complication, not stated as uncontrolled   . Unspecified essential hypertension     Patient Active Problem List   Diagnosis Date Noted  . Preventative health care 06/21/2016  . GERD (gastroesophageal reflux disease) 12/25/2014  . Onychomycosis 08/11/2014  . HTN (hypertension) 10/11/2013  . Hidradenitis suppurativa 08/02/2011  . Fatigue 08/02/2011  . Obstructive sleep apnea 02/15/2011  . Hypertension 02/15/2011  . Hyperlipidemia 01/16/2011  . PROTEINURIA 11/16/2009  . Diabetes type 2, uncontrolled ([place]) 11/03/2009  . ANEMIA 11/03/2009  . MORBID OBESITY 10/31/2009  . TOBACCO ABUSE 10/31/2009    Past Surgical History:  Procedure Laterality Date  . ABDOMINAL HYSTERECTOMY    . ABLATION COLPOCLESIS    . CESAREAN SECTION    . CLEFT PALATE REPAIR    .  cyst removal    . ECTOPIC PREGNANCY SURGERY     x 2  . OOPHORECTOMY Right 2002     OB History   None      Home Medications    Prior to Admission medications   Medication Sig Start Date End Date Taking? Authorizing Provider  albuterol (PROVENTIL HFA;VENTOLIN HFA) 108 (90 Base) MCG/ACT inhaler Inhale 2 puffs into the lungs every 6 (six) hours as needed for wheezing or shortness of breath. 10/01/16   [name], NP  amLODipine (NORVASC) 10 MG tablet Take 1 tablet (10 mg total) by mouth daily. 11/08/16   [name], NP  atorvastatin (LIPITOR) 40 MG tablet TAKE 1 TABLET (40 MG TOTAL) BY MOUTH DAILY. 02/11/17   [name], NP  BAYER MICROLET LANCETS lancets Use as instructed to check blood sugar three times daily.  DX  E11.65 10/24/17   [name], NP  beclomethasone (QVAR) 80 MCG/ACT inhaler 4 puffs twice daily for next 3 days, then 2 puffs twice daily 01/05/18   [name], NP  cephALEXin (KEFLEX) 500 MG capsule Take 1 capsule (500 mg total) by mouth 3 (three) times daily. 02/27/18   [name], NP  clindamycin (CLEOCIN-T) 1 % external solution Apply topically 2 (two) times daily. 05/10/18   Molpus, John, MD  doxycycline (VIBRAMYCIN) 100 MG capsule Take 1 capsule (100 mg total) by mouth 2 (two) times daily. 05/10/18   Molpus, John, MD  glucose blood (BAYER CONTOUR TEST) test strip Use to check  blood sugar 3 times per day.  DX  E11.65 10/24/17   [name], NP  guaiFENesin-codeine (CHERATUSSIN AC) 100-10 MG/5ML syrup Take 5 mLs by mouth 3 (three) times daily as needed for cough. 01/05/18   [name], NP  HYDROcodone-acetaminophen (NORCO/VICODIN) 5-325 MG tablet Take 1 tablet by mouth every 4 (four) hours as needed for up to 3 days. 05/12/18 05/15/18  Mortis, Alvie Heidelberg I, PA-C  Insulin Glargine (LANTUS SOLOSTAR) 100 UNIT/ML Solostar Pen Inject 55 Units into the skin every morning. And pen needles 1/day 02/04/18   Renato Shin, MD    lisinopril (PRINIVIL,ZESTRIL) 20 MG tablet Take 1 tablet (20 mg total) by mouth daily. 08/19/16   [name], NP  methocarbamol (ROBAXIN) 500 MG tablet Take 1 tablet (500 mg total) by mouth 2 (two) times daily as needed for muscle spasms. 03/22/18   Sherwood Gambler, MD  pantoprazole (PROTONIX) 40 MG tablet Take 1 tablet (40 mg total) by mouth daily. 01/05/18   [name], NP  traMADol (ULTRAM) 50 MG tablet Take 1 tablet (50 mg total) by mouth every 8 (eight) hours as needed. 02/27/18   [name], NP  valACYclovir (VALTREX) 1000 MG tablet Take 1 tablet by mouth once daily for 5 days at start of rash 02/20/18   [name], NP    Family History Family History  Problem Relation Age of Onset  . Heart attack Maternal Aunt   . Diabetes Mother   . Cancer Father        oral cancer    Social History Social History   Tobacco Use  . Smoking status: Current Every Day Smoker    Packs/day: 0.50    Years: 10.00    Pack years: 5.00    Types: Cigarettes  . Smokeless tobacco: Never Used  Substance Use Topics  . Alcohol use: Yes    Alcohol/week: 0.0 standard drinks    Comment: occasional use  . Drug use: No     Allergies   Ibuprofen; Labetalol; and Aspirin   Review of Systems Review of Systems  Constitutional: Negative for chills and fever.  Genitourinary: Negative.   Musculoskeletal: Negative.   Skin: Positive for wound.  Neurological: Negative.      Physical Exam Updated Vital Signs BP (!) 158/110 (BP Location: Right Arm)   Pulse (!) 139   Temp 98.4 F (36.9 C) (Oral)   Ht 5\' 6"  (1.676 m)   Wt 127 kg   LMP 09/13/2011   SpO2 95%   BMI 45.19 kg/m   Physical Exam  Constitutional:  Obese.  Cardiovascular: Normal rate, regular rhythm and normal heart sounds.  Pulmonary/Chest: Effort normal and breath sounds normal.  Musculoskeletal: Normal range of motion.  Skin: Skin is warm and intact.     Two fluctuant, tender abscesses on posterior  left upper leg. Surrounding area is erythema. Several areas of induration and well healed incisions in groin from past I&Ds.  Nursing note and vitals reviewed.    ED Treatments / Results  Labs (all labs ordered are listed, but only abnormal results are displayed) Labs Reviewed - No data to display  EKG None  Radiology No results found.  Procedures .Marland KitchenIncision and Drainage Date/Time: 05/12/2018 12:43 PM Performed by: Romie Jumper, PA-C Authorized by: Romie Jumper, PA-C   Consent:    Consent obtained:  Verbal   Consent given by:  Patient   Risks discussed:  Bleeding, incomplete drainage, pain and infection   Alternatives discussed:  No treatment Location:  Type:  Abscess   Size:  2cm   Location:  Lower extremity   Lower extremity location:  Leg   Leg location:  L upper leg Pre-procedure details:    Skin preparation:  Betadine Sedation:    Sedation type:  Anxiolysis (25mg  IV Benadryl) Anesthesia (see MAR for exact dosages):    Anesthesia method:  Local infiltration   Local anesthetic:  Lidocaine 2% WITH epi Procedure type:    Complexity:  Simple Procedure details:    Incision types:  Single straight   Incision depth:  Subcutaneous   Scalpel blade:  11   Wound management:  Probed and deloculated, irrigated with saline and extensive cleaning   Drainage:  Bloody   Drainage amount:  Scant   Wound treatment:  Wound left open   Packing materials:  None Post-procedure details:    Patient tolerance of procedure:  Tolerated well, no immediate complications .Marland KitchenIncision and Drainage Date/Time: 05/12/2018 12:45 PM Performed by: Romie Jumper, PA-C Authorized by: Romie Jumper, PA-C   Consent:    Consent obtained:  Verbal   Consent given by:  Patient   Risks discussed:  Bleeding, incomplete drainage, pain and infection   Alternatives discussed:  No treatment Location:    Type:  Abscess   Size:  3   Location:  Lower extremity   Lower extremity  location:  Leg   Leg location:  L upper leg Pre-procedure details:    Skin preparation:  Betadine Sedation:    Sedation type:  Anxiolysis (25mg  IV Benadryl) Anesthesia (see MAR for exact dosages):    Anesthesia method:  Local infiltration   Local anesthetic:  Lidocaine 2% WITH epi Procedure type:    Complexity:  Simple Procedure details:    Incision types:  Single straight   Incision depth:  Subcutaneous   Scalpel blade:  11   Wound management:  Probed and deloculated, irrigated with saline and extensive cleaning   Drainage:  Serosanguinous   Drainage amount:  Copious   Wound treatment:  Wound left open   Packing materials:  None Post-procedure details:    Patient tolerance of procedure:  Tolerated well, no immediate complications Comments:     Drainage was dark brown, purulent and malodorous.   (including critical care time)  Medications Ordered in ED Medications  morphine 2 MG/ML injection 2 mg (2 mg Intravenous Given 05/12/18 1213)  diphenhydrAMINE (BENADRYL) injection 25 mg (25 mg Intravenous Given 05/12/18 1213)  ondansetron (ZOFRAN) injection 4 mg (4 mg Intravenous Given 05/12/18 1212)  lidocaine-EPINEPHrine (XYLOCAINE W/EPI) 2 %-1:200000 (PF) injection 20 mL (20 mLs Infiltration Given 05/12/18 1221)  diphenhydrAMINE (BENADRYL) injection 25 mg (25 mg Intravenous Given 05/12/18 1245)    Initial Impression / Assessment and Plan / ED Course  Triage vital signs and the nursing notes have been reviewed.  Pertinent labs & imaging results that were available during care of the patient were reviewed and considered in medical decision making (see chart for details).  Clinical Course as of May 12 1399  Tue May 12, 2018  1102 Patient presented tachycardic and hypertensive. RN alerted this provider about this and stated that the patient was crying and endorsing severe 10/10 pain. Will have vitals repeated once patient has rested.   [GM]    Clinical Course User Index [GM] Romie Jumper, [place]   Patient presents with posterior leg abscesses which are recurrent. Per medical record review, abscesses present today were not significantly different from past ones. There is surrounding erythema  suggestive of cellulitis. However, it is reassuring that there was not any streaking, arthralgias or consitutional s/s suggestive of a widespread infection. I&Ds were performed without complication.  Final Clinical Impressions(s) / ED Diagnoses  1. Left Lower Extremity Abscesses x2. I&Ds performed without complicated. Advised to continue doxycycline and clindamycin topical which was prescribed on 05/09/18. Prescribed Norco for pain control given new incisions and clearly identifiable source of pain. Education provided on wound care, follow-up and s/s of infection that warrant sooner follow-up. Advised to call dermatologist for follow-up and outpatient management.  Dispo: Home. After thorough clinical evaluation, this patient is determined to be medically stable and can be safely discharged with the previously mentioned treatment and/or outpatient follow-up/referral(s). At this time, there are no other apparent medical conditions that require further screening, evaluation or treatment.   Final diagnoses:  Abscess of left leg    ED Discharge Orders         Ordered    HYDROcodone-acetaminophen (NORCO/VICODIN) 5-325 MG tablet  Every 4 hours PRN     05/12/18 1359            Maury Dus I, PA-C 05/12/18 1400    Margette Fast, MD 05/12/18 2108

## 2018-05-12 NOTE — Discharge Instructions (Signed)
I drained 2 abscesses today. Continue to take the doxycycline and clindamycin cream that you were prescribed on 05/09/18.  Please follow-up with your previous dermatologist about other treatment options for these abscesses. If you are unable to get in with them soon, I recommend you call your PCP.  Follow-up with a medical provider sooner if you have one or more of the following symptoms: fever; increased redness, warmth or tenderness at the wound site; pain beyond where the initial wound was; unusual discharge.  Thank you for allowing me to take care of you today!

## 2018-05-12 NOTE — ED Triage Notes (Signed)
Pt presents with c/o abscess to left inner thigh for a week. Pt was seen last week for the same but pain is worse now.

## 2018-05-13 ENCOUNTER — Telehealth: Payer: Self-pay

## 2018-05-13 NOTE — Telephone Encounter (Signed)
Noted  

## 2018-05-13 NOTE — Telephone Encounter (Signed)
Called patient to see if she has been abe to schedule follow up appointment with dermatology. Left message and advised patient to call back to schedule with M. O'sullivan if she has not been abe to get in with dermatology.

## 2018-05-15 MED ORDER — DOXYCYCLINE HYCLATE 100 MG PO TABS: 100.0000 mg | ORAL_TABLET | Freq: Two times a day (BID) | ORAL | 0 refills | Status: DC

## 2018-05-15 MED FILL — DOXYCYCLINE HYCLATE 100 MG: 100 | 7 days supply | Qty: 14 | Fill #0

## 2018-05-15 NOTE — Telephone Encounter (Signed)
Refill sent, however she really needs someone to re-evaluate her next week.  I would like to see if we can get her in with another provider at our office next week please. If symptoms worsen, go to the ER.

## 2018-05-15 NOTE — Addendum Note (Signed)
Addended by: [name] on: 05/15/2018 01:07 PM   Modules accepted: Orders

## 2018-05-15 NOTE — Telephone Encounter (Signed)
Pt called back, she was fired from her job and does not have any ins. She stated she can not afford to go to the dermatologist.   She would rather see Melissa.  She stated that she is afraid  that it is going to get infected and would like to know if there is away she could be worked in today?   Best number  [phone]

## 2018-05-15 NOTE — Telephone Encounter (Signed)
Notified pt and scheduled appt with PA, Saguier for Monday at 9:20am

## 2018-05-15 NOTE — Telephone Encounter (Signed)
Author phoned pt. to set a future appointment with [name], PCP. Melissa cannot add-on any patients for today. OK for PEC to schedule for next week.

## 2018-05-15 NOTE — Telephone Encounter (Signed)
Pt stated that she is about to run out of the Antibiotics , she said that it is getting better but she feels she needs another round.  She thinks that the hospital probably should have given her IV meds for this one in the 1st place.  Pt does not feel she needs to go back to the ER.   Please advise   Pharmacy [facility]

## 2018-05-15 NOTE — Telephone Encounter (Signed)
Author left VM asking for call-back [phone]. OK for PEC to schedule.

## 2018-05-17 DIAGNOSIS — R079 Chest pain, unspecified: Secondary | ICD-10-CM | POA: Diagnosis not present

## 2018-05-18 ENCOUNTER — Ambulatory Visit: Payer: Medicaid Other | Admitting: Medical

## 2018-06-01 ENCOUNTER — Emergency Department (HOSPITAL_BASED_OUTPATIENT_CLINIC_OR_DEPARTMENT_OTHER)
Admission: EM | Admit: 2018-06-01 | Discharge: 2018-06-01 | Disposition: A | Discharge status: Home / Self Care | Payer: Medicaid Other | Attending: Emergency Medicine | Admitting: Emergency Medicine

## 2018-06-01 DIAGNOSIS — Z79899 Other long term (current) drug therapy: Secondary | ICD-10-CM | POA: Insufficient documentation

## 2018-06-01 DIAGNOSIS — R0789 Other chest pain: Secondary | ICD-10-CM | POA: Insufficient documentation

## 2018-06-01 DIAGNOSIS — E119 Type 2 diabetes mellitus without complications: Secondary | ICD-10-CM | POA: Insufficient documentation

## 2018-06-01 DIAGNOSIS — I10 Essential (primary) hypertension: Secondary | ICD-10-CM | POA: Insufficient documentation

## 2018-06-01 DIAGNOSIS — Z794 Long term (current) use of insulin: Secondary | ICD-10-CM | POA: Insufficient documentation

## 2018-06-01 DIAGNOSIS — F17210 Nicotine dependence, cigarettes, uncomplicated: Secondary | ICD-10-CM | POA: Insufficient documentation

## 2018-06-01 MED ORDER — CYCLOBENZAPRINE HCL 5 MG PO TABS: 5.0000 mg | ORAL_TABLET | Freq: Two times a day (BID) | ORAL | 0 refills | Status: DC | PRN

## 2018-06-01 MED ORDER — KETOROLAC TROMETHAMINE 30 MG/ML IJ SOLN
30.0000 mg | Freq: Once | INTRAMUSCULAR | Status: AC
  Administered 2018-06-01: 30 mg via INTRAMUSCULAR
  Filled 2018-06-01: qty 1

## 2018-06-01 MED ORDER — NAPROXEN 500 MG PO TABS: 500.0000 mg | ORAL_TABLET | Freq: Two times a day (BID) | ORAL | 0 refills | Status: DC

## 2018-06-01 NOTE — ED Triage Notes (Signed)
Pt seen here in June for back pain and was sent home with medications that are ineffective. Pt c/o of left side rib cage pain since June. Denies over symptoms. Tender to palpation and inspiration

## 2018-06-01 NOTE — Discharge Instructions (Addendum)
You were seen today for continued back pain and left-sided chest wall pain.  You have no evidence of shingles.  He may have a muscle strain.  I have reviewed your imaging from [facility] which was very reassuring.  I am unsure what is causing your pain.  You should trial anti-inflammatories to see if this helps.  Follow-up with your primary physician if not improving.

## 2018-06-01 NOTE — ED Provider Notes (Signed)
[facility] EMERGENCY DEPARTMENT Provider Note   CSN: [id number] Arrival date & time: 06/01/18  0056     History   Chief Complaint Chief Complaint  Patient presents with  . Back Pain    HPI Catherine Espinoza is a 42 y.o. female.  HPI  This is a 42 year old obese female who presents with left posterior chest wall pain.  Patient reports ongoing pain and discomfort for over 2 months.  She was seen and evaluated here in June.  She states that she was given a short course of Norco and Robaxin which "did not really help."  She has had ongoing pain.  She rates her pain 8 out of 10.  It is not particularly worse by breathing or with movement.  She has not noted any rashes.  She reports that she has tried multiple over-the-counter remedies as well as patches and IcyHot with minimal relief.  Denies any shortness of breath or chest pain.  She was also seen at [facility] on 8/18.  At that time they did a chest pain work-up including a CT angiogram which I have reviewed.  No significant abnormalities.  Troponin and EKG were reassuring.  Patient states "doc I know something is wrong."  She denies any recent fevers or cough.  Past Medical History:  Diagnosis Date  . Anemia, unspecified   . Excessive or frequent menstruation   . Morbid obesity ([place])   . Obstructive sleep apnea 02/11/11   Sleep study: severe OSA- rec CPAP 20cm small  full face mask  . Proteinuria   . Type II or unspecified type diabetes mellitus without mention of complication, not stated as uncontrolled   . Unspecified essential hypertension     Patient Active Problem List   Diagnosis Date Noted  . Preventative health care 06/21/2016  . GERD (gastroesophageal reflux disease) 12/25/2014  . Onychomycosis 08/11/2014  . HTN (hypertension) 10/11/2013  . Hidradenitis suppurativa 08/02/2011  . Fatigue 08/02/2011  . Obstructive sleep apnea 02/15/2011  . Hypertension 02/15/2011  . Hyperlipidemia 01/16/2011  .  PROTEINURIA 11/16/2009  . Diabetes type 2, uncontrolled ([place]) 11/03/2009  . ANEMIA 11/03/2009  . MORBID OBESITY 10/31/2009  . TOBACCO ABUSE 10/31/2009    Past Surgical History:  Procedure Laterality Date  . ABDOMINAL HYSTERECTOMY    . ABLATION COLPOCLESIS    . CESAREAN SECTION    . CLEFT PALATE REPAIR    . cyst removal    . ECTOPIC PREGNANCY SURGERY     x 2  . OOPHORECTOMY Right 2002     OB History   None      Home Medications    Prior to Admission medications   Medication Sig Start Date End Date Taking? Authorizing Provider  albuterol (PROVENTIL HFA;VENTOLIN HFA) 108 (90 Base) MCG/ACT inhaler Inhale 2 puffs into the lungs every 6 (six) hours as needed for wheezing or shortness of breath. 10/01/16   [name], NP  amLODipine (NORVASC) 10 MG tablet Take 1 tablet (10 mg total) by mouth daily. 11/08/16   [name], NP  atorvastatin (LIPITOR) 40 MG tablet TAKE 1 TABLET (40 MG TOTAL) BY MOUTH DAILY. 02/11/17   [name], NP  BAYER MICROLET LANCETS lancets Use as instructed to check blood sugar three times daily.  DX  E11.65 10/24/17   [name], NP  beclomethasone (QVAR) 80 MCG/ACT inhaler 4 puffs twice daily for next 3 days, then 2 puffs twice daily 01/05/18   [name], NP  clindamycin (CLEOCIN-T) 1 %  external solution Apply topically 2 (two) times daily. 05/10/18   Molpus, John, MD  cyclobenzaprine (FLEXERIL) 5 MG tablet Take 1 tablet (5 mg total) by mouth 2 (two) times daily as needed for muscle spasms. 06/01/18   Maksym Pfiffner, Barbette Hair, MD  doxycycline (VIBRA-TABS) 100 MG tablet Take 1 tablet (100 mg total) by mouth 2 (two) times daily. 05/15/18   [name], NP  doxycycline (VIBRAMYCIN) 100 MG capsule Take 1 capsule (100 mg total) by mouth 2 (two) times daily. 05/10/18   Molpus, John, MD  glucose blood (BAYER CONTOUR TEST) test strip Use to check blood sugar 3 times per day.  DX  E11.65 10/24/17   [name], NP    guaiFENesin-codeine (CHERATUSSIN AC) 100-10 MG/5ML syrup Take 5 mLs by mouth 3 (three) times daily as needed for cough. 01/05/18   [name], NP  Insulin Glargine (LANTUS SOLOSTAR) 100 UNIT/ML Solostar Pen Inject 55 Units into the skin every morning. And pen needles 1/day 02/04/18   Renato Shin, MD  lisinopril (PRINIVIL,ZESTRIL) 20 MG tablet Take 1 tablet (20 mg total) by mouth daily. 08/19/16   [name], NP  methocarbamol (ROBAXIN) 500 MG tablet Take 1 tablet (500 mg total) by mouth 2 (two) times daily as needed for muscle spasms. 03/22/18   Sherwood Gambler, MD  naproxen (NAPROSYN) 500 MG tablet Take 1 tablet (500 mg total) by mouth 2 (two) times daily. 06/01/18   Alexandro Line, Barbette Hair, MD  pantoprazole (PROTONIX) 40 MG tablet Take 1 tablet (40 mg total) by mouth daily. 01/05/18   [name], NP  traMADol (ULTRAM) 50 MG tablet Take 1 tablet (50 mg total) by mouth every 8 (eight) hours as needed. 02/27/18   [name], NP  valACYclovir (VALTREX) 1000 MG tablet Take 1 tablet by mouth once daily for 5 days at start of rash 02/20/18   [name], NP    Family History Family History  Problem Relation Age of Onset  . Heart attack Maternal Aunt   . Diabetes Mother   . Cancer Father        oral cancer    Social History Social History   Tobacco Use  . Smoking status: Current Every Day Smoker    Packs/day: 0.50    Years: 10.00    Pack years: 5.00    Types: Cigarettes  . Smokeless tobacco: Never Used  Substance Use Topics  . Alcohol use: Yes    Alcohol/week: 0.0 standard drinks    Comment: occasional use  . Drug use: No     Allergies   Ibuprofen; Labetalol; and Aspirin   Review of Systems Review of Systems  Constitutional: Negative for fever.  Respiratory: Negative for cough and shortness of breath.   Cardiovascular: Positive for chest pain. Negative for leg swelling.  Gastrointestinal: Negative for abdominal pain, diarrhea, nausea and  vomiting.  Musculoskeletal: Positive for back pain.  Skin: Negative for rash and wound.  All other systems reviewed and are negative.    Physical Exam Updated Vital Signs BP (!) 162/122   Pulse 99   Temp 98.1 F (36.7 C) (Oral)   Ht 5\' 6"  (1.676 m)   Wt 127 kg   LMP 09/13/2011   SpO2 98%   BMI 45.19 kg/m   Physical Exam  Constitutional: She is oriented to person, place, and time.  Morbidly obese but nontoxic, no acute distress  HENT:  Head: Normocephalic and atraumatic.  Eyes: Pupils are equal, round, and reactive to light.  Cardiovascular: Normal rate,  regular rhythm and normal heart sounds.  No murmur heard. Tenderness palpation left posterior chest wall along the lower ribs, no overlying skin changes, no crepitus  Pulmonary/Chest: Effort normal and breath sounds normal. No respiratory distress. She has no wheezes.  Abdominal: Soft. Bowel sounds are normal. There is no tenderness.  Neurological: She is alert and oriented to person, place, and time.  Skin: Skin is warm and dry.  No rash or blistering noted  Psychiatric: She has a normal mood and affect.  Nursing note and vitals reviewed.    ED Treatments / Results  Labs (all labs ordered are listed, but only abnormal results are displayed) Labs Reviewed - No data to display  EKG None  Radiology No results found.  Procedures Procedures (including critical care time)  Medications Ordered in ED Medications  ketorolac (TORADOL) 30 MG/ML injection 30 mg (30 mg Intramuscular Given 06/01/18 0143)     Initial Impression / Assessment and Plan / ED Course  I have reviewed the triage vital signs and the nursing notes.  Pertinent labs & imaging results that were available during my care of the patient were reviewed by me and considered in my medical decision making (see chart for details).     Patient presents with continued left-sided chest wall pain.  Ongoing for the last 2 months.  She had x-rays in June and  had a subsequent chest pain work-up including a PE work-up just 2 weeks ago that was reassuring.  On exam, she has tenderness over the skin and soft tissues of the left posterior chest.  No overlying skin changes to suggest shingles.  Would doubt prodrome to shingles given duration of symptoms.  There is no crepitus.  She is previously had a negative chest x-ray and her breath sounds are clear.  Doubt PE.  Exam is somewhat limited secondary to body habitus.  Could represent a pulled muscle.  Patient is resistant to try anti-inflammatory medications because she reports "knots in my mouth" with ibuprofen.  I stressed to her that I felt like a trial of anti-inflammatories may be very helpful.  Will trial naproxen.  She does need to follow-up with her primary physician.  After history, exam, and medical workup I feel the patient has been appropriately medically screened and is safe for discharge home. Pertinent diagnoses were discussed with the patient. Patient was given return precautions.   Final Clinical Impressions(s) / ED Diagnoses   Final diagnoses:  Left-sided chest wall pain    ED Discharge Orders         Ordered    naproxen (NAPROSYN) 500 MG tablet  2 times daily     06/01/18 0150    cyclobenzaprine (FLEXERIL) 5 MG tablet  2 times daily PRN     06/01/18 0150           Merryl Hacker, MD 06/01/18 [phone]

## 2018-06-01 NOTE — ED Notes (Signed)
ED Provider at bedside. 

## 2018-06-01 NOTE — ED Notes (Signed)
Pt understood dc material. NAD Noted. Script given at dc 

## 2018-06-02 ENCOUNTER — Encounter: Payer: Self-pay | Admitting: Family

## 2018-06-05 ENCOUNTER — Telehealth: Payer: Self-pay | Admitting: *Deleted

## 2018-06-05 NOTE — Telephone Encounter (Signed)
Spoke with pt and advised her that the flu vaccine she received in January 2019 was for the 2018/19 flu season, not the flu season we are in now 2019/20. Advised her I will leave a copy of her immunizations at the front desk for her to pick up but wanted to make sure she was aware. Pt voices understanding and states that she will also drop off a form that needs to be completed based on her last CPE which was 10/24/17. Immunization record placed at the front desk.Marland Kitchen

## 2018-06-05 NOTE — Telephone Encounter (Signed)
Copied from [place] [id number]. Topic: General - Other >> Jun 04, 2018 11:06 AM Yvette Rack wrote: Reason for CRM: pt calling wanting a note saying that she has had a flu shot this year she think she got it in January 2019 please leave copy up front pt will come by and pick it up >> Jun 04, 2018 11:09 AM Yvette Rack wrote: Please call pt when note is ready she need it for school

## 2018-06-08 ENCOUNTER — Telehealth: Payer: Self-pay | Admitting: Family

## 2018-06-08 NOTE — Telephone Encounter (Signed)
Patient came in and dropped off an employment CPR form for [place] to sign. Pt stated that she would like to be called when the form is finished and she would come and pick it up.

## 2018-06-09 NOTE — Telephone Encounter (Signed)
Received Employment Physical, physician results form, completed as much as possible; forwarded to provider with copy of CPR notes/SLS 09/10

## 2018-06-15 ENCOUNTER — Telehealth: Payer: Self-pay | Admitting: Family

## 2018-06-15 NOTE — Telephone Encounter (Signed)
Notified pt. She is currently without insurance and is aware that if she is billed after the visit she does not get a self-pay discount. Pt voices understanding and scheduled appt for 06/23/18 at 12pm.

## 2018-06-15 NOTE — Telephone Encounter (Signed)
I have a form for her employer to complete. Given her recent ER visit, I will need to see her back in the office for follow up before I can complete the form.

## 2018-06-23 ENCOUNTER — Encounter: Payer: Self-pay | Admitting: Family

## 2018-06-23 ENCOUNTER — Telehealth: Payer: Self-pay | Admitting: *Deleted

## 2018-06-23 ENCOUNTER — Ambulatory Visit (INDEPENDENT_AMBULATORY_CARE_PROVIDER_SITE_OTHER): Payer: Medicaid Other | Admitting: Family

## 2018-06-23 VITALS — BP 134/90 | HR 74 | Temp 99.5°F | Resp 16 | Ht 65.0 in | Wt 266.4 lb

## 2018-06-23 DIAGNOSIS — Z23 Encounter for immunization: Secondary | ICD-10-CM

## 2018-06-23 DIAGNOSIS — D582 Other hemoglobinopathies: Secondary | ICD-10-CM

## 2018-06-23 DIAGNOSIS — L732 Hidradenitis suppurativa: Secondary | ICD-10-CM

## 2018-06-23 DIAGNOSIS — R634 Abnormal weight loss: Secondary | ICD-10-CM

## 2018-06-23 DIAGNOSIS — E1165 Type 2 diabetes mellitus with hyperglycemia: Secondary | ICD-10-CM

## 2018-06-23 DIAGNOSIS — I10 Essential (primary) hypertension: Secondary | ICD-10-CM

## 2018-06-23 DIAGNOSIS — M549 Dorsalgia, unspecified: Secondary | ICD-10-CM

## 2018-06-23 LAB — FERRITIN: Ferritin: 172.8 ng/mL (ref 10.0–291.0)

## 2018-06-23 LAB — BASIC METABOLIC PANEL
BUN: 10 mg/dL (ref 6–23)
CALCIUM: 9.8 mg/dL (ref 8.4–10.5)
CO2: 28 mEq/L (ref 19–32)
CREATININE: 0.64 mg/dL (ref 0.40–1.20)
Chloride: 93 mEq/L — ABNORMAL LOW (ref 96–112)
GFR: 130.61 mL/min (ref >60.00)
GLUCOSE: 572 mg/dL — AB (ref 70–99)
Potassium: 4.3 mEq/L (ref 3.5–5.1)
Sodium: 130 mEq/L — ABNORMAL LOW (ref 135–145)

## 2018-06-23 LAB — CBC WITH DIFFERENTIAL/PLATELET
BASOS PCT: 1.2 % (ref 0.0–3.0)
Basophils Absolute: 0.1 10*3/uL (ref 0.0–0.1)
EOS ABS: 0.2 10*3/uL (ref 0.0–0.7)
Eosinophils Relative: 1.9 % (ref 0.0–5.0)
HEMATOCRIT: 48.2 % — AB (ref 36.0–46.0)
Hemoglobin: 15.9 g/dL — ABNORMAL HIGH (ref 12.0–15.0)
LYMPHS PCT: 38.6 % (ref 12.0–46.0)
Lymphs Abs: 3.1 10*3/uL (ref 0.7–4.0)
MCHC: 33 g/dL (ref 30.0–36.0)
MCV: 88.5 fl (ref 78.0–100.0)
MONO ABS: 0.6 10*3/uL (ref 0.1–1.0)
Monocytes Relative: 7.7 % (ref 3.0–12.0)
NEUTROS ABS: 4.1 10*3/uL (ref 1.4–7.7)
Neutrophils Relative %: 50.6 % (ref 43.0–77.0)
PLATELETS: 352 10*3/uL (ref 150.0–400.0)
RBC: 5.44 Mil/uL — ABNORMAL HIGH (ref 3.87–5.11)
RDW: 14.7 % (ref 11.5–15.5)
WBC: 8.1 10*3/uL (ref 4.0–10.5)

## 2018-06-23 LAB — TSH: TSH: 0.86 u[IU]/mL (ref 0.35–4.50)

## 2018-06-23 LAB — HEMOGLOBIN A1C: Hgb A1c MFr Bld: 14.9 % — ABNORMAL HIGH (ref 4.6–6.5)

## 2018-06-23 LAB — IRON: IRON: 75 ug/dL (ref 42–145)

## 2018-06-23 MED ORDER — NAPROXEN 500 MG PO TABS: 500.0000 mg | ORAL_TABLET | Freq: Two times a day (BID) | ORAL | 0 refills | Status: DC | PRN

## 2018-06-23 MED ORDER — CEPHALEXIN 500 MG PO CAPS: 500.0000 mg | ORAL_CAPSULE | Freq: Three times a day (TID) | ORAL | 0 refills | Status: DC

## 2018-06-23 NOTE — Patient Instructions (Addendum)
Please complete lab work prior to leaving. Begin keflex for boil under your arm.  Call if symptoms worsen or fail to improve. Take insulin daily.

## 2018-06-23 NOTE — Telephone Encounter (Signed)
Received lab for elevated glucose of 572.  Was advised to call patient per Dr. Etter Sjogren to let patient to know that she needs to take her medication tonight and to come in to be seen tomorrow to talk about a plan.  Patient stated that she does not have any insurance and cannot afford to come back in.  Advised patient how serious her glucose level was and risks.  She still would not come in.  Advised what she needs to look out for as far high blood sugar goes and go to the ER.  Advise that there are other options to get medications, told her to go online for the patient assistance program for Lantus and that we can maybe offer her some samples to get her thru til she get some insurance.  Patient agreed (I think) to go online to at least print out the application and to take her Lantus tonight as she was told.  Also advised that I will send message to [facility].

## 2018-06-23 NOTE — Progress Notes (Signed)
Subjective:    Patient ID: Catherine Espinoza, female    DOB: 1976-03-02, 42 y.o.   MRN: [id number]  HPI  Patient is a 42 yr old female who presents today for follow up. I last saw her 02/27/18.  Since that time she has visit the ED on 8/10, 05/11/18, 8/13 for hidradenitis/abscess.  She was seen 05/17/18 for back pain and 9/2 for atypical chest pain.   Uses lantus  But when she can afford the needles.   Wt Readings from Last 3 Encounters:  06/23/18 266 lb 6.4 oz (120.8 kg)  06/01/18 279 lb 15.8 oz (127 kg)  05/12/18 280 lb (127 kg)   Reports that a few months back she lost her job and became very depressed. Was not taking care of herself. Has been without insurance. 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  Overall she is doing better now.  Lab Results  Component Value Date   HGBA1C 14.0 01/05/2018   Reports that she used naproxen for back pain and requests a refill.      Review of Systems See HPI  Past Medical History:  Diagnosis Date  . Anemia, unspecified   . Excessive or frequent menstruation   . Morbid obesity ([place])   . Obstructive sleep apnea 02/11/11   Sleep study: severe OSA- rec CPAP 20cm small  full face mask  . Proteinuria   . Type II or unspecified type diabetes mellitus without mention of complication, not stated as uncontrolled   . Unspecified essential hypertension      Social History   Socioeconomic History  . Marital status: Single    Spouse name: Not on file  . Number of children: Not on file  . Years of education: Not on file  . Highest education level: Not on file  Occupational History  . Occupation: cna/med Engineer, production: [employer]  Social Needs  . Financial resource strain: Not on file  . Food insecurity:    Worry: Not on file    Inability: Not on file  . Transportation needs:    Medical: Not on file    Non-medical: Not on file  Tobacco Use  . Smoking status: Current Every Day Smoker    Packs/day:  0.50    Years: 10.00    Pack years: 5.00    Types: Cigarettes  . Smokeless tobacco: Never Used  Substance and Sexual Activity  . Alcohol use: Yes    Alcohol/week: 0.0 standard drinks    Comment: occasional use  . Drug use: No  . Sexual activity: Yes    Birth control/protection: Surgical  Lifestyle  . Physical activity:    Days per week: Not on file    Minutes per session: Not on file  . Stress: Not on file  Relationships  . Social connections:    Talks on phone: Not on file    Gets together: Not on file    Attends religious service: Not on file    Active member of club or organization: Not on file    Attends meetings of clubs or organizations: Not on file    Relationship status: Not on file  . Intimate partner violence:    Fear of current or ex partner: Not on file    Emotionally abused: Not on file    Physically abused: Not on file    Forced sexual activity: Not on file  Other Topics Concern  . Not on file  Social History Narrative   Arts administrator for divorced   Daughter born 2003   Works as Industrial/product designer    Past Surgical History:  Procedure Laterality Date  . ABDOMINAL HYSTERECTOMY    . ABLATION COLPOCLESIS    . CESAREAN SECTION    . CLEFT PALATE REPAIR    . cyst removal    . ECTOPIC PREGNANCY SURGERY     x 2  . OOPHORECTOMY Right 2002    Family History  Problem Relation Age of Onset  . Heart attack Maternal Aunt   . Diabetes Mother   . Cancer Father        oral cancer    Allergies  Allergen Reactions  . Ibuprofen     REACTION: knots in mouth with SOB  . Labetalol Nausea Only  . Aspirin     Knots in mouth     Current Outpatient Medications on File Prior to Visit  Medication Sig Dispense Refill  . albuterol (PROVENTIL HFA;VENTOLIN HFA) 108 (90 Base) MCG/ACT inhaler Inhale 2 puffs into the lungs every 6 (six) hours as needed for wheezing or shortness of breath. 1 Inhaler 0  . amLODipine (NORVASC) 10 MG tablet Take 1 tablet (10 mg total) by mouth  daily. 30 tablet 2  . atorvastatin (LIPITOR) 40 MG tablet TAKE 1 TABLET (40 MG TOTAL) BY MOUTH DAILY. 30 tablet 5  . BAYER MICROLET LANCETS lancets Use as instructed to check blood sugar three times daily.  DX  E11.65 100 each 5  . beclomethasone (QVAR) 80 MCG/ACT inhaler 4 puffs twice daily for next 3 days, then 2 puffs twice daily 1 Inhaler 0  . clindamycin (CLEOCIN-T) 1 % external solution Apply topically 2 (two) times daily. 30 mL 0  . cyclobenzaprine (FLEXERIL) 5 MG tablet Take 1 tablet (5 mg total) by mouth 2 (two) times daily as needed for muscle spasms. 10 tablet 0  . doxycycline (VIBRA-TABS) 100 MG tablet Take 1 tablet (100 mg total) by mouth 2 (two) times daily. 14 tablet 0  . doxycycline (VIBRAMYCIN) 100 MG capsule Take 1 capsule (100 mg total) by mouth 2 (two) times daily. 14 capsule 0  . glucose blood (BAYER CONTOUR TEST) test strip Use to check blood sugar 3 times per day.  DX  E11.65 100 each 5  . guaiFENesin-codeine (CHERATUSSIN AC) 100-10 MG/5ML syrup Take 5 mLs by mouth 3 (three) times daily as needed for cough. 75 mL 0  . Insulin Glargine (LANTUS SOLOSTAR) 100 UNIT/ML Solostar Pen Inject 55 Units into the skin every morning. And pen needles 1/day 30 mL 5  . lisinopril (PRINIVIL,ZESTRIL) 20 MG tablet Take 1 tablet (20 mg total) by mouth daily. 90 tablet 1  . methocarbamol (ROBAXIN) 500 MG tablet Take 1 tablet (500 mg total) by mouth 2 (two) times daily as needed for muscle spasms. 10 tablet 0  . naproxen (NAPROSYN) 500 MG tablet Take 1 tablet (500 mg total) by mouth 2 (two) times daily. 30 tablet 0  . pantoprazole (PROTONIX) 40 MG tablet Take 1 tablet (40 mg total) by mouth daily. 30 tablet 3  . traMADol (ULTRAM) 50 MG tablet Take 1 tablet (50 mg total) by mouth every 8 (eight) hours as needed. 12 tablet 0  . valACYclovir (VALTREX) 1000 MG tablet Take 1 tablet by mouth once daily for 5 days at start of rash 15 tablet 1   No current facility-administered medications on file prior  to visit.     BP 134/90 (BP Location: Right Arm,  Patient Position: Sitting, Cuff Size: Large)   Pulse 74   Temp 99.5 F (37.5 C) (Oral)   Resp 16   Ht 5\' 5"  (1.651 m)   Wt 266 lb 6.4 oz (120.8 kg)   LMP 09/13/2011   SpO2 97%   BMI 44.33 kg/m       Objective:   Physical Exam  Constitutional: She appears well-developed and well-nourished.  Cardiovascular: Normal rate, regular rhythm and normal heart sounds.  No murmur heard. Pulmonary/Chest: Effort normal and breath sounds normal. No respiratory distress. She has no wheezes.  Skin:  Tender induration with some drainage left axilla   Psychiatric: She has a normal mood and affect. Her behavior is normal. Judgment and thought content normal.          Assessment & Plan:  Hidradenitis- rx with keflex.    DM2- uncontrolled. Reinforced compliance. She is hoping to get insurance soon. Check A1C.  HTN- bp acceptable. Continue current meds.  Back pain- rx sent for prn naproxen. Overall improved. She understands to use sparingly.   She has lost some weight and wonders if it may be the "stress."  Check TSH.    Elevated hemoglobin (in ED previously) recheck CBC and iron studies.

## 2018-06-24 NOTE — Telephone Encounter (Signed)
Please contact pt and ask her how she is feeling today and what her sugar was today.  If sugar >450 despite taking lantus, she should go to the ER.

## 2018-07-17 ENCOUNTER — Other Ambulatory Visit: Payer: Self-pay | Admitting: Family

## 2018-07-21 ENCOUNTER — Other Ambulatory Visit: Payer: Self-pay | Admitting: Family

## 2018-08-26 ENCOUNTER — Encounter: Payer: Self-pay | Admitting: Family

## 2018-08-26 ENCOUNTER — Ambulatory Visit: Payer: 59 | Admitting: Family

## 2018-08-26 DIAGNOSIS — I159 Secondary hypertension, unspecified: Secondary | ICD-10-CM

## 2018-08-26 DIAGNOSIS — M546 Pain in thoracic spine: Secondary | ICD-10-CM

## 2018-08-26 DIAGNOSIS — E785 Hyperlipidemia, unspecified: Secondary | ICD-10-CM

## 2018-08-26 DIAGNOSIS — J45909 Unspecified asthma, uncomplicated: Secondary | ICD-10-CM

## 2018-08-26 DIAGNOSIS — E1165 Type 2 diabetes mellitus with hyperglycemia: Secondary | ICD-10-CM | POA: Diagnosis not present

## 2018-08-26 DIAGNOSIS — L732 Hidradenitis suppurativa: Secondary | ICD-10-CM

## 2018-08-26 MED ORDER — CEPHALEXIN 500 MG PO CAPS: 500.0000 mg | ORAL_CAPSULE | Freq: Three times a day (TID) | ORAL | 0 refills | Status: DC

## 2018-08-26 MED ORDER — LIRAGLUTIDE 18 MG/3ML ~~LOC~~ SOPN: PEN_INJECTOR | SUBCUTANEOUS | 5 refills | Status: DC

## 2018-08-26 MED FILL — LISINOPRIL 20 MG TABLET: 20 | 90 days supply | Qty: 90 | Fill #0

## 2018-08-26 MED FILL — CEPHALEXIN 500 MG CAPSULE: 500 | 7 days supply | Qty: 21 | Fill #0

## 2018-08-26 MED FILL — AMLODIPINE BESYLATE 10 MG T: 10 | 30 days supply | Qty: 30 | Fill #0

## 2018-08-26 NOTE — Patient Instructions (Addendum)
Please begin victoza once daily:  Initial: 0.6 mg once daily for 1 week, then increase to 1.2 mg once daily  Watch sugar closely. Call me if sugars <80.   Schedule follow up with Dr. Loanne Drilling.  Begin keflex for the hidradenitis. Call if symptoms worse or fail to improve.   Work on weight loss and exercises as below for your back pain.   Back Exercises The following exercises strengthen the muscles that help to support the back. They also help to keep the lower back flexible. Doing these exercises can help to prevent back pain or lessen existing pain. If you have back pain or discomfort, try doing these exercises 2-3 times each day or as told by your health care provider. When the pain goes away, do them once each day, but increase the number of times that you repeat the steps for each exercise (do more repetitions). If you do not have back pain or discomfort, do these exercises once each day or as told by your health care provider. Exercises Single Knee to Chest  Repeat these steps 3-5 times for each leg: 1. Lie on your back on a firm bed or the floor with your legs extended. 2. Bring one knee to your chest. Your other leg should stay extended and in contact with the floor. 3. Hold your knee in place by grabbing your knee or thigh. 4. Pull on your knee until you feel a gentle stretch in your lower back. 5. Hold the stretch for 10-30 seconds. 6. Slowly release and straighten your leg.  Pelvic Tilt  Repeat these steps 5-10 times: 1. Lie on your back on a firm bed or the floor with your legs extended. 2. Bend your knees so they are pointing toward the ceiling and your feet are flat on the floor. 3. Tighten your lower abdominal muscles to press your lower back against the floor. This motion will tilt your pelvis so your tailbone points up toward the ceiling instead of pointing to your feet or the floor. 4. With gentle tension and even breathing, hold this position for 5-10  seconds.  Cat-Cow  Repeat these steps until your lower back becomes more flexible: 1. Get into a hands-and-knees position on a firm surface. Keep your hands under your shoulders, and keep your knees under your hips. You may place padding under your knees for comfort. 2. Let your head hang down, and point your tailbone toward the floor so your lower back becomes rounded like the back of a cat. 3. Hold this position for 5 seconds. 4. Slowly lift your head and point your tailbone up toward the ceiling so your back forms a sagging arch like the back of a cow. 5. Hold this position for 5 seconds.  Press-Ups  Repeat these steps 5-10 times: 1. Lie on your abdomen (face-down) on the floor. 2. Place your palms near your head, about shoulder-width apart. 3. While you keep your back as relaxed as possible and keep your hips on the floor, slowly straighten your arms to raise the top half of your body and lift your shoulders. Do not use your back muscles to raise your upper torso. You may adjust the placement of your hands to make yourself more comfortable. 4. Hold this position for 5 seconds while you keep your back relaxed. 5. Slowly return to lying flat on the floor.  Bridges  Repeat these steps 10 times: 1. Lie on your back on a firm surface. 2. Bend your knees so  they are pointing toward the ceiling and your feet are flat on the floor. 3. Tighten your buttocks muscles and lift your buttocks off of the floor until your waist is at almost the same height as your knees. You should feel the muscles working in your buttocks and the back of your thighs. If you do not feel these muscles, slide your feet 1-2 inches farther away from your buttocks. 4. Hold this position for 3-5 seconds. 5. Slowly lower your hips to the starting position, and allow your buttocks muscles to relax completely.  If this exercise is too easy, try doing it with your arms crossed over your chest. Abdominal Crunches  Repeat  these steps 5-10 times: 1. Lie on your back on a firm bed or the floor with your legs extended. 2. Bend your knees so they are pointing toward the ceiling and your feet are flat on the floor. 3. Cross your arms over your chest. 4. Tip your chin slightly toward your chest without bending your neck. 5. Tighten your abdominal muscles and slowly raise your trunk (torso) high enough to lift your shoulder blades a tiny bit off of the floor. Avoid raising your torso higher than that, because it can put too much stress on your low back and it does not help to strengthen your abdominal muscles. 6. Slowly return to your starting position.  Back Lifts Repeat these steps 5-10 times: 1. Lie on your abdomen (face-down) with your arms at your sides, and rest your forehead on the floor. 2. Tighten the muscles in your legs and your buttocks. 3. Slowly lift your chest off of the floor while you keep your hips pressed to the floor. Keep the back of your head in line with the curve in your back. Your eyes should be looking at the floor. 4. Hold this position for 3-5 seconds. 5. Slowly return to your starting position.  Contact a health care provider if:  Your back pain or discomfort gets much worse when you do an exercise.  Your back pain or discomfort does not lessen within 2 hours after you exercise. If you have any of these problems, stop doing these exercises right away. Do not do them again unless your health care provider says that you can. Get help right away if:  You develop sudden, severe back pain. If this happens, stop doing the exercises right away. Do not do them again unless your health care provider says that you can. This information is not intended to replace advice given to you by your health care provider. Make sure you discuss any questions you have with your health care provider. Document Released: 10/24/2004 Document Revised: 01/24/2016 Document Reviewed: 11/10/2014 Elsevier Interactive  Patient Education  2017 [employer].

## 2018-08-26 NOTE — Progress Notes (Signed)
Subjective:    Patient ID: Catherine Espinoza, female    DOB: May 15, 1976, 42 y.o.   MRN: [id number]  HPI  Catherine Espinoza is a 42 yr old female who presents today with chief complaint of upper back pain. Reports that she has had pain since June.  Reports pain is "near my bra strap." Pain is "coming and going."  Reports it feels like "pins in my back." has associated numbness overlying the area.    Asthma- reports symptoms are well controlled. Not needing qvar now.  HTN- reports that she has not taken her medicine today.  BP Readings from Last 3 Encounters:  08/26/18 (!) 157/105  06/23/18 134/90  06/01/18 (!) 162/122   DM2- reports sugars are in the 200's and 300's.  She continues insulin 55 units once daily.   Lab Results  Component Value Date   HGBA1C 14.9 (H) 06/23/2018   HGBA1C 14.0 01/05/2018   HGBA1C 13.7 (H) 10/24/2017   Lab Results  Component Value Date   MICROALBUR 6.6 (H) 06/21/2016   LDLCALC 134 (H) 10/24/2017   CREATININE 0.64 06/23/2018    Hyperlipidemia- reports that she is taking lipitor.  Lab Results  Component Value Date   CHOL 195 10/24/2017   HDL 41.10 10/24/2017   LDLCALC 134 (H) 10/24/2017   LDLDIRECT 164.9 08/11/2014   TRIG 100.0 10/24/2017   CHOLHDL 5 10/24/2017     Review of Systems    see HPI  Past Medical History:  Diagnosis Date  . Anemia, unspecified   . Excessive or frequent menstruation   . Morbid obesity ([place])   . Obstructive sleep apnea 02/11/11   Sleep study: severe OSA- rec CPAP 20cm small  full face mask  . Proteinuria   . Type II or unspecified type diabetes mellitus without mention of complication, not stated as uncontrolled   . Unspecified essential hypertension      Social History   Socioeconomic History  . Marital status: Single    Spouse name: Not on file  . Number of children: Not on file  . Years of education: Not on file  . Highest education level: Not on file  Occupational History  . Occupation: cna/med Architect: [employer]  Social Needs  . Financial resource strain: Not on file  . Food insecurity:    Worry: Not on file    Inability: Not on file  . Transportation needs:    Medical: Not on file    Non-medical: Not on file  Tobacco Use  . Smoking status: Current Every Day Smoker    Packs/day: 0.50    Years: 10.00    Pack years: 5.00    Types: Cigarettes  . Smokeless tobacco: Never Used  Substance and Sexual Activity  . Alcohol use: Yes    Alcohol/week: 0.0 standard drinks    Comment: occasional use  . Drug use: No  . Sexual activity: Yes    Birth control/protection: Surgical  Lifestyle  . Physical activity:    Days per week: Not on file    Minutes per session: Not on file  . Stress: Not on file  Relationships  . Social connections:    Talks on phone: Not on file    Gets together: Not on file    Attends religious service: Not on file    Active member of club or organization: Not on file    Attends meetings of clubs or organizations: Not on file    Relationship status: Not on file  .  Intimate partner violence:    Fear of current or ex partner: Not on file    Emotionally abused: Not on file    Physically abused: Not on file    Forced sexual activity: Not on file  Other Topics Concern  . Not on file  Social History Narrative   [place] for divorced   Daughter born 2003   Works as cna/med tech    Past Surgical History:  Procedure Laterality Date  . ABDOMINAL HYSTERECTOMY    . ABLATION COLPOCLESIS    . CESAREAN SECTION    . CLEFT PALATE REPAIR    . cyst removal    . ECTOPIC PREGNANCY SURGERY     x 2  . OOPHORECTOMY Right 2002    Family History  Problem Relation Age of Onset  . Heart attack Maternal Aunt   . Diabetes Mother   . Cancer Father        oral cancer    Allergies  Allergen Reactions  . Ibuprofen     REACTION: knots in mouth with SOB  . Labetalol Nausea Only  . Aspirin     Knots in mouth     Current Outpatient Medications on  File Prior to Visit  Medication Sig Dispense Refill  . amLODipine (NORVASC) 10 MG tablet TAKE 1 TABLET (10 MG TOTAL) BY MOUTH DAILY. 30 tablet 2  . atorvastatin (LIPITOR) 40 MG tablet TAKE 1 TABLET (40 MG TOTAL) BY MOUTH DAILY. 30 tablet 5  . BAYER MICROLET LANCETS lancets Use as instructed to check blood sugar three times daily.  DX  E11.65 100 each 5  . beclomethasone (QVAR) 80 MCG/ACT inhaler 4 puffs twice daily for next 3 days, then 2 puffs twice daily 1 Inhaler 0  . glucose blood (BAYER CONTOUR TEST) test strip Use to check blood sugar 3 times per day.  DX  E11.65 100 each 5  . Insulin Glargine (LANTUS SOLOSTAR) 100 UNIT/ML Solostar Pen Inject 55 Units into the skin every morning. And pen needles 1/day 30 mL 5  . lisinopril (PRINIVIL,ZESTRIL) 20 MG tablet TAKE 1 TABLET (20 MG TOTAL) BY MOUTH DAILY. 90 tablet 1  . pantoprazole (PROTONIX) 40 MG tablet Take 1 tablet (40 mg total) by mouth daily. 30 tablet 3  . valACYclovir (VALTREX) 1000 MG tablet Take 1 tablet by mouth once daily for 5 days at start of rash 15 tablet 1   No current facility-administered medications on file prior to visit.     BP (!) 157/105 (BP Location: Left Arm, Patient Position: Sitting, Cuff Size: Large)   Pulse 97   Temp 98.3 F (36.8 C) (Oral)   Ht 5' (1.524 m)   Wt 275 lb 6.4 oz (124.9 kg)   LMP 09/13/2011   SpO2 98%   BMI 53.79 kg/m    Objective:   Physical Exam  Constitutional: She is oriented to person, place, and time. She appears well-developed and well-nourished. No distress.  Cardiovascular: Normal rate and regular rhythm.  No murmur heard. Pulmonary/Chest: Effort normal and breath sounds normal. No stridor. No respiratory distress. She has no wheezes.  Musculoskeletal: She exhibits no edema.       Cervical back: She exhibits no tenderness.       Thoracic back: She exhibits no tenderness.       Lumbar back: She exhibits no bony tenderness.  Neurological: She is alert and oriented to person,  place, and time.  Skin: Skin is warm and dry.  Small area of fluctuance left  breast at 3 oclock and right breast at [place].   Psychiatric: She has a normal mood and affect. Her behavior is normal. Thought content normal.          Assessment & Plan:  Hidradentitis- rx with keflex. Discussed importance of glycemic control.  DM2- uncontrolled. Add victoza, advised her to arrange follow up with Endo.  HTN- uncontrolled. Restart meds. Repeat bp in 1 month.  Thoracic back pain- had normal thoracic x-ray back in June. suspect habitus is contributing due to large breasts/obesity. She reports she can tolerate aleve. Advised otc aleve 220mg  bid x 1 week + exercises as noted on AVS + weight loss.  Morbid obesity- refer to weight management clinic. Discussed diet. Hopefully victoza with help her some with weight loss as well.   Asthma- stable without daily qvar. Monitor.   Hyperlipidemia- above goal. Plan lipid panel next visit.

## 2018-09-08 ENCOUNTER — Encounter: Payer: Self-pay | Admitting: Family

## 2018-09-08 ENCOUNTER — Telehealth: Payer: Self-pay

## 2018-09-08 NOTE — Telephone Encounter (Signed)
PA initiated via Covermymeds; KEY: [id number]. Awaiting determination.

## 2018-09-11 NOTE — Telephone Encounter (Signed)
PA denied. Approval requires that Pt try: Bydureon Bycise, Byetta or Trulicity.

## 2018-09-14 ENCOUNTER — Encounter: Payer: Self-pay | Admitting: Family

## 2018-09-15 MED ORDER — FLUCONAZOLE 150 MG PO TABS: ORAL_TABLET | ORAL | 0 refills | Status: DC

## 2018-09-15 MED ORDER — DULAGLUTIDE 0.75 MG/0.5ML ~~LOC~~ SOAJ: 0.5000 mL | SUBCUTANEOUS | 5 refills | Status: DC

## 2018-09-15 MED FILL — FLUCONAZOLE 150 MG TABS: 150 | 2 days supply | Qty: 2 | Fill #0

## 2018-09-15 NOTE — Telephone Encounter (Signed)
rx sent for trulicity in place of victoza- this is only injected once weekly. Please advise pt.

## 2018-09-16 NOTE — Telephone Encounter (Signed)
Patient advised of medication change due to insurance denial.

## 2018-09-29 ENCOUNTER — Encounter: Payer: Self-pay | Admitting: Family

## 2018-09-29 ENCOUNTER — Ambulatory Visit: Payer: 59 | Admitting: Family

## 2018-09-29 VITALS — BP 147/81 | HR 103 | Temp 98.4°F | Resp 16 | Ht 60.0 in | Wt 264.0 lb

## 2018-09-29 DIAGNOSIS — E785 Hyperlipidemia, unspecified: Secondary | ICD-10-CM | POA: Diagnosis not present

## 2018-09-29 DIAGNOSIS — L732 Hidradenitis suppurativa: Secondary | ICD-10-CM

## 2018-09-29 DIAGNOSIS — E1165 Type 2 diabetes mellitus with hyperglycemia: Secondary | ICD-10-CM | POA: Diagnosis not present

## 2018-09-29 DIAGNOSIS — F329 Major depressive disorder, single episode, unspecified: Secondary | ICD-10-CM | POA: Diagnosis not present

## 2018-09-29 DIAGNOSIS — F32A Depression, unspecified: Secondary | ICD-10-CM

## 2018-09-29 MED ORDER — SITAGLIPTIN PHOSPHATE 100 MG PO TABS: 100.0000 mg | ORAL_TABLET | Freq: Every day | ORAL | 5 refills | Status: DC

## 2018-09-29 MED ORDER — FLUOXETINE HCL 10 MG PO TABS: ORAL_TABLET | ORAL | 0 refills | Status: DC

## 2018-09-29 MED ORDER — CEPHALEXIN 500 MG PO CAPS: 500.0000 mg | ORAL_CAPSULE | Freq: Three times a day (TID) | ORAL | 0 refills | Status: DC

## 2018-09-29 MED FILL — FLUoxetine HCL 10 MG TABS: 10 | 30 days supply | Qty: 30 | Fill #0

## 2018-09-29 MED FILL — CEPHALEXIN 500 MG CAPSULE: 500 | 7 days supply | Qty: 21 | Fill #0

## 2018-09-29 MED FILL — JANUVIA 100 MG TABLET: 100 | 30 days supply | Qty: 30 | Fill #0

## 2018-09-29 NOTE — Patient Instructions (Signed)
Start keflex for boils. Come back in 1 week if boils are not significantly improved.  Add januvia once daily for sugar.  You should be contacted about your referral back to Dr. Loanne Drilling. Consider getting in with [facility] for counseling. Begin prozac 10mg ,  1/2 tab once daily for 1 week, then increase to a full tab once daily.

## 2018-09-29 NOTE — Progress Notes (Signed)
Subjective:    Patient ID: Catherine Espinoza, female    DOB: Jun 25, 1976, 42 y.o.   MRN: [id number]  HPI   Patient is a 42 yr old female who presents today for follow up.  HTN-  Last visit bp was elevated and she was advised to restart her medications.   BP Readings from Last 3 Encounters:  09/29/18 (!) 147/81  08/26/18 (!) 157/105  06/23/18 134/90     DM2-  Last visit rx was sent for victoza. Insurance declined victoza and recommended trial of byetta/bydureon or trulicity first.  Rx was sent for trulicity.  Pt states insurance would not cover trulicity either. Reports that she is taking lantus.  Reports that her sugars have been as high as 300.    Hidradenitis- notes that she continues to have boils.  Notes that she is really frustrated about this.   Also reports that she continues to feel very stressed about her work schedule.  She is working nighttime shift 12 hours.  She has not slept in the last 24 hours.  Just a lot of work.  Feels stressed because she lost her job over the summer.  She now has a new job with new benefits which she is pleased about.  Notes that she has chronic pain all over including pain from her boils. Concerned that she might be depressed.   Review of Systems See HPI  Past Medical History:  Diagnosis Date  . Anemia, unspecified   . Excessive or frequent menstruation   . Morbid obesity ([place])   . Obstructive sleep apnea 02/11/11   Sleep study: severe OSA- rec CPAP 20cm small  full face mask  . Proteinuria   . Type II or unspecified type diabetes mellitus without mention of complication, not stated as uncontrolled   . Unspecified essential hypertension      Social History   Socioeconomic History  . Marital status: Single    Spouse name: Not on file  . Number of children: Not on file  . Years of education: Not on file  . Highest education level: Not on file  Occupational History  . Occupation: cna/med Engineer, production: [employer]  Social Needs    . Financial resource strain: Not on file  . Food insecurity:    Worry: Not on file    Inability: Not on file  . Transportation needs:    Medical: Not on file    Non-medical: Not on file  Tobacco Use  . Smoking status: Current Every Day Smoker    Packs/day: 0.50    Years: 10.00    Pack years: 5.00    Types: Cigarettes  . Smokeless tobacco: Never Used  Substance and Sexual Activity  . Alcohol use: Yes    Alcohol/week: 0.0 standard drinks    Comment: occasional use  . Drug use: No  . Sexual activity: Yes    Birth control/protection: Surgical  Lifestyle  . Physical activity:    Days per week: Not on file    Minutes per session: Not on file  . Stress: Not on file  Relationships  . Social connections:    Talks on phone: Not on file    Gets together: Not on file    Attends religious service: Not on file    Active member of club or organization: Not on file    Attends meetings of clubs or organizations: Not on file    Relationship status: Not on file  . Intimate partner violence:  Fear of current or ex partner: Not on file    Emotionally abused: Not on file    Physically abused: Not on file    Forced sexual activity: Not on file  Other Topics Concern  . Not on file  Social History Narrative   Gerome Sam for divorced   Daughter born 2003   Works as cna/med tech    Past Surgical History:  Procedure Laterality Date  . ABDOMINAL HYSTERECTOMY    . ABLATION COLPOCLESIS    . CESAREAN SECTION    . CLEFT PALATE REPAIR    . cyst removal    . ECTOPIC PREGNANCY SURGERY     x 2  . OOPHORECTOMY Right 2002    Family History  Problem Relation Age of Onset  . Heart attack Maternal Aunt   . Diabetes Mother   . Cancer Father        oral cancer    Allergies  Allergen Reactions  . Ibuprofen     REACTION: knots in mouth with SOB  . Labetalol Nausea Only  . Aspirin     Knots in mouth     Current Outpatient Medications on File Prior to Visit  Medication Sig  Dispense Refill  . amLODipine (NORVASC) 10 MG tablet TAKE 1 TABLET (10 MG TOTAL) BY MOUTH DAILY. 30 tablet 2  . atorvastatin (LIPITOR) 40 MG tablet TAKE 1 TABLET (40 MG TOTAL) BY MOUTH DAILY. 30 tablet 5  . BAYER MICROLET LANCETS lancets Use as instructed to check blood sugar three times daily.  DX  E11.65 100 each 5  . beclomethasone (QVAR) 80 MCG/ACT inhaler 4 puffs twice daily for next 3 days, then 2 puffs twice daily 1 Inhaler 0  . Dulaglutide (TRULICITY) 8.93 YB/0.1BP SOPN Inject 0.5 mLs into the skin once a week. 2 mL 5  . fluconazole (DIFLUCAN) 150 MG tablet Take 1 tab by mouth now and again in 3 days. 2 tablet 0  . glucose blood (BAYER CONTOUR TEST) test strip Use to check blood sugar 3 times per day.  DX  E11.65 100 each 5  . Insulin Glargine (LANTUS SOLOSTAR) 100 UNIT/ML Solostar Pen Inject 55 Units into the skin every morning. And pen needles 1/day 30 mL 5  . lisinopril (PRINIVIL,ZESTRIL) 20 MG tablet TAKE 1 TABLET (20 MG TOTAL) BY MOUTH DAILY. 90 tablet 1  . pantoprazole (PROTONIX) 40 MG tablet Take 1 tablet (40 mg total) by mouth daily. 30 tablet 3  . valACYclovir (VALTREX) 1000 MG tablet Take 1 tablet by mouth once daily for 5 days at start of rash 15 tablet 1   No current facility-administered medications on file prior to visit.     BP (!) 147/81 (BP Location: Right Arm, Patient Position: Sitting, Cuff Size: Large)   Pulse (!) 103   Temp 98.4 F (36.9 C) (Oral)   Resp 16   Ht 5' (1.524 m)   Wt 264 lb (119.7 kg)   LMP 09/13/2011   SpO2 100%   BMI 51.56 kg/m       Objective:   Physical Exam Constitutional:      Appearance: She is well-developed.  Neck:     Musculoskeletal: Neck supple.     Thyroid: No thyromegaly.  Skin:    General: Skin is warm and dry.     Comments: Tender abscess right groin, approx 1 inch in diameater. Similar sized abscess at base of left inner buttock with some bloody drainage  Neurological:     Mental Status: She  is alert and oriented to  person, place, and time.  Psychiatric:        Thought Content: Thought content normal.        Judgment: Judgment normal.     Comments: Tearful.             Assessment & Plan:  Hidradentitis suppurativa-treat with Keflex 3 times daily for 1 week.  She is advised to let me know if symptoms worsen or if not significantly improved in 1 week.  Recommended that she continue sitz bath's to promote drainage.  Call sooner if symptoms worsen.  Diabetes type 2-uncontrolled.  We discussed that her uncontrolled sugars are likely worsening her boils.  We will plan to treat with addition of Januvia to her current regimen.  We will arrange follow-up with endocrinology.  Continue Lantus.  Depression- we will give trial of Prozac 10 mg. I instructed pt to start 1/2 tablet once daily for 1 week and then increase to a full tablet once daily on week two as tolerated.  Also discussed rare but serious side effect of suicide ideation.  She is instructed to discontinue medication go directly to ED if this occurs.  Pt verbalizes understanding.  Plan follow up in 1 month to evaluate progress.  I also suggested that she work on improving sleep hygeine and scheduling an appointment with [facility] for counseling.   HTN- bp is improved today. Will continue current meds and plan to have the patient follow back up in 1 month. If not improved consider further med adjustment at that time.   Hyperlipidemia- non-fasting today. She prefers to complete fasting next visit.     A total of 30  minutes were spent face-to-face with the patient during this encounter and over half of that time was spent on counseling and coordination of care. The patient was counseled on depression/diabetes control.

## 2018-10-01 ENCOUNTER — Encounter: Payer: Self-pay | Admitting: Family

## 2018-10-01 MED ORDER — TRAMADOL HCL 50 MG PO TABS: 50.0000 mg | ORAL_TABLET | Freq: Three times a day (TID) | ORAL | 0 refills | Status: DC | PRN

## 2018-10-20 ENCOUNTER — Encounter: Payer: Self-pay | Admitting: Family

## 2018-10-20 ENCOUNTER — Ambulatory Visit: Payer: 59 | Admitting: Family

## 2018-10-20 VITALS — BP 151/87 | HR 102 | Temp 98.4°F | Resp 16 | Ht 65.0 in | Wt 258.0 lb

## 2018-10-20 DIAGNOSIS — L03317 Cellulitis of buttock: Secondary | ICD-10-CM

## 2018-10-20 DIAGNOSIS — L0231 Cutaneous abscess of buttock: Secondary | ICD-10-CM

## 2018-10-20 NOTE — Progress Notes (Signed)
Subjective:    Patient ID: Catherine Espinoza, female    DOB: 02/11/1976, 43 y.o.   MRN: [id number]  HPI  Pt is a 43 yr old female who presents today with c/o painful abscesses. Reports that abscesses are located on the left buttock and the right groin. Hurting so badly that she is having trouble sitting and having trouble sleeping due to the pain. "It hurt so bad that I threw up last night."  Review of Systems See HPI  Past Medical History:  Diagnosis Date  . Anemia, unspecified   . Excessive or frequent menstruation   . Morbid obesity ([place])   . Obstructive sleep apnea 02/11/11   Sleep study: severe OSA- rec CPAP 20cm small  full face mask  . Proteinuria   . Type II or unspecified type diabetes mellitus without mention of complication, not stated as uncontrolled   . Unspecified essential hypertension      Social History   Socioeconomic History  . Marital status: Single    Spouse name: Not on file  . Number of children: Not on file  . Years of education: Not on file  . Highest education level: Not on file  Occupational History  . Occupation: cna/med Engineer, production: [employer]  Social Needs  . Financial resource strain: Not on file  . Food insecurity:    Worry: Not on file    Inability: Not on file  . Transportation needs:    Medical: Not on file    Non-medical: Not on file  Tobacco Use  . Smoking status: Current Every Day Smoker    Packs/day: 0.50    Years: 10.00    Pack years: 5.00    Types: Cigarettes  . Smokeless tobacco: Never Used  Substance and Sexual Activity  . Alcohol use: Yes    Alcohol/week: 0.0 standard drinks    Comment: occasional use  . Drug use: No  . Sexual activity: Yes    Birth control/protection: Surgical  Lifestyle  . Physical activity:    Days per week: Not on file    Minutes per session: Not on file  . Stress: Not on file  Relationships  . Social connections:    Talks on phone: Not on file    Gets together: Not on file   Attends religious service: Not on file    Active member of club or organization: Not on file    Attends meetings of clubs or organizations: Not on file    Relationship status: Not on file  . Intimate partner violence:    Fear of current or ex partner: Not on file    Emotionally abused: Not on file    Physically abused: Not on file    Forced sexual activity: Not on file  Other Topics Concern  . Not on file  Social History Narrative   Gerome Sam for divorced   Daughter born 2003   Works as cna/med tech    Past Surgical History:  Procedure Laterality Date  . ABDOMINAL HYSTERECTOMY    . ABLATION COLPOCLESIS    . CESAREAN SECTION    . CLEFT PALATE REPAIR    . cyst removal    . ECTOPIC PREGNANCY SURGERY     x 2  . OOPHORECTOMY Right 2002    Family History  Problem Relation Age of Onset  . Heart attack Maternal Aunt   . Diabetes Mother   . Cancer Father        oral cancer  Allergies  Allergen Reactions  . Ibuprofen     REACTION: knots in mouth with SOB  . Labetalol Nausea Only  . Aspirin     Knots in mouth     Current Outpatient Medications on File Prior to Visit  Medication Sig Dispense Refill  . amLODipine (NORVASC) 10 MG tablet TAKE 1 TABLET (10 MG TOTAL) BY MOUTH DAILY. 30 tablet 2  . atorvastatin (LIPITOR) 40 MG tablet TAKE 1 TABLET (40 MG TOTAL) BY MOUTH DAILY. 30 tablet 5  . BAYER MICROLET LANCETS lancets Use as instructed to check blood sugar three times daily.  DX  E11.65 100 each 5  . beclomethasone (QVAR) 80 MCG/ACT inhaler 4 puffs twice daily for next 3 days, then 2 puffs twice daily 1 Inhaler 0  . FLUoxetine (PROZAC) 10 MG tablet 1/2 tab by mouth once daily for 1 week, then increase to a full tablet once daily on week two 30 tablet 0  . glucose blood (BAYER CONTOUR TEST) test strip Use to check blood sugar 3 times per day.  DX  E11.65 100 each 5  . Insulin Glargine (LANTUS SOLOSTAR) 100 UNIT/ML Solostar Pen Inject 55 Units into the skin every  morning. And pen needles 1/day 30 mL 5  . lisinopril (PRINIVIL,ZESTRIL) 20 MG tablet TAKE 1 TABLET (20 MG TOTAL) BY MOUTH DAILY. 90 tablet 1  . pantoprazole (PROTONIX) 40 MG tablet Take 1 tablet (40 mg total) by mouth daily. 30 tablet 3  . sitaGLIPtin (JANUVIA) 100 MG tablet Take 1 tablet (100 mg total) by mouth daily. 30 tablet 5  . valACYclovir (VALTREX) 1000 MG tablet Take 1 tablet by mouth once daily for 5 days at start of rash 15 tablet 1  . traMADol (ULTRAM) 50 MG tablet Take 1 tablet (50 mg total) by mouth every 8 (eight) hours as needed. (Patient not taking: Reported on 10/20/2018) 15 tablet 0   No current facility-administered medications on file prior to visit.     BP (!) 151/87 (BP Location: Right Arm, Patient Position: Sitting, Cuff Size: Large)   Pulse (!) 102   Temp 98.4 F (36.9 C) (Oral)   Resp 16   Ht 5\' 5"  (1.651 m)   Wt 258 lb (117 kg)   LMP 09/13/2011   SpO2 99%   BMI 42.93 kg/m       Objective:   Physical Exam Constitutional:      Appearance: Normal appearance. She is well-developed.  Neck:     Musculoskeletal: Neck supple.     Thyroid: No thyromegaly.  Skin:    General: Skin is warm and dry.          Comments: + tender indurated erythematous swelling noted left buttock medially, blood drainage noted  + tender indurated erythematous swelling right groin/upper thigh with foul smelling purulent drainage coming from central opening  Neurological:     Mental Status: She is alert and oriented to person, place, and time.  Psychiatric:        Behavior: Behavior normal.        Thought Content: Thought content normal.        Judgment: Judgment normal.           Assessment & Plan:  Abscess/cellulitis- left buttock and right groin.  I did discuss with the Triage nurse at [facility]. I described the abscesses to her and she felt that they would best be handled through the ED as they may be too large for local anesthesia.  Discussed  with  patient and she is agreeable to proceed to the [facility] ED.  Report given to Triage nurse "Antony Madura" RN at the ED.

## 2018-10-20 NOTE — Patient Instructions (Signed)
Please proceed to the ED at [facility] for further evaluation.

## 2018-10-29 ENCOUNTER — Encounter (INDEPENDENT_AMBULATORY_CARE_PROVIDER_SITE_OTHER): Payer: 59

## 2018-10-30 ENCOUNTER — Encounter (HOSPITAL_BASED_OUTPATIENT_CLINIC_OR_DEPARTMENT_OTHER): Payer: Self-pay | Admitting: Adult Health

## 2018-10-30 ENCOUNTER — Other Ambulatory Visit: Payer: Self-pay

## 2018-10-30 ENCOUNTER — Emergency Department (HOSPITAL_BASED_OUTPATIENT_CLINIC_OR_DEPARTMENT_OTHER)
Admission: EM | Admit: 2018-10-30 | Discharge: 2018-10-30 | Disposition: A | Discharge status: Home / Self Care | Payer: 59 | Attending: Emergency Medicine | Admitting: Emergency Medicine

## 2018-10-30 DIAGNOSIS — L732 Hidradenitis suppurativa: Secondary | ICD-10-CM | POA: Insufficient documentation

## 2018-10-30 DIAGNOSIS — Z79899 Other long term (current) drug therapy: Secondary | ICD-10-CM | POA: Diagnosis not present

## 2018-10-30 DIAGNOSIS — L0231 Cutaneous abscess of buttock: Secondary | ICD-10-CM | POA: Insufficient documentation

## 2018-10-30 DIAGNOSIS — F17210 Nicotine dependence, cigarettes, uncomplicated: Secondary | ICD-10-CM | POA: Diagnosis not present

## 2018-10-30 DIAGNOSIS — I10 Essential (primary) hypertension: Secondary | ICD-10-CM | POA: Diagnosis not present

## 2018-10-30 DIAGNOSIS — Z794 Long term (current) use of insulin: Secondary | ICD-10-CM | POA: Diagnosis not present

## 2018-10-30 DIAGNOSIS — E119 Type 2 diabetes mellitus without complications: Secondary | ICD-10-CM | POA: Diagnosis not present

## 2018-10-30 MED ORDER — CLINDAMYCIN HCL 300 MG PO CAPS: 300.0000 mg | ORAL_CAPSULE | Freq: Two times a day (BID) | ORAL | 0 refills | Status: DC

## 2018-10-30 MED ORDER — HYDROCODONE-ACETAMINOPHEN 5-325 MG PO TABS: 1.0000 | ORAL_TABLET | Freq: Four times a day (QID) | ORAL | 0 refills | Status: DC | PRN

## 2018-10-30 MED FILL — CLINDAMYCIN HCL 300 MG CAPS: 300 | 7 days supply | Qty: 14 | Fill #0

## 2018-10-30 MED FILL — HYDROCODON-APAP 5-325: 5-325 | 2 days supply | Qty: 8 | Fill #0

## 2018-10-30 NOTE — ED Provider Notes (Signed)
[facility] EMERGENCY DEPARTMENT Provider Note   CSN: [id number] Arrival date & time: 10/30/18  1040     History   Chief Complaint Chief Complaint  Patient presents with  . Abscess    HPI Catherine Espinoza is a 43 y.o. female with history of morbid obesity, diabetes, hidradenitis suppurativa who presents with recurrent abscesses.  She presents today for an abscess on her left gluteal fold that has been coming and going for the past 2 to 3 months.  She reports it opens and drains and then returns.  She reports her doctor put her on Keflex last week, however it did not seem to help.  She denies any fevers.  She reports she has been dealing with recurrent abscesses since she was 16.  She reports seeing a surgeon once and had some procedures to her axilla, however she still continues to have recurrent abscess there sometimes as well.  She is having active drainage of the left gluteal fold abscess.  She denies any pain with bowel movements or pain around her rectum.  Patient has been taking Naprosyn at home without relief of pain.  HPI  Past Medical History:  Diagnosis Date  . Anemia, unspecified   . Excessive or frequent menstruation   . Morbid obesity ([place])   . Obstructive sleep apnea 02/11/11   Sleep study: severe OSA- rec CPAP 20cm small  full face mask  . Proteinuria   . Type II or unspecified type diabetes mellitus without mention of complication, not stated as uncontrolled   . Unspecified essential hypertension     Patient Active Problem List   Diagnosis Date Noted  . Preventative health care 06/21/2016  . GERD (gastroesophageal reflux disease) 12/25/2014  . Onychomycosis 08/11/2014  . HTN (hypertension) 10/11/2013  . Hidradenitis suppurativa 08/02/2011  . Fatigue 08/02/2011  . Obstructive sleep apnea 02/15/2011  . Hypertension 02/15/2011  . Hyperlipidemia 01/16/2011  . PROTEINURIA 11/16/2009  . Diabetes type 2, uncontrolled ([place]) 11/03/2009  . ANEMIA 11/03/2009    . MORBID OBESITY 10/31/2009  . TOBACCO ABUSE 10/31/2009    Past Surgical History:  Procedure Laterality Date  . ABDOMINAL HYSTERECTOMY    . ABLATION COLPOCLESIS    . CESAREAN SECTION    . CLEFT PALATE REPAIR    . cyst removal    . ECTOPIC PREGNANCY SURGERY     x 2  . OOPHORECTOMY Right 2002     OB History   No obstetric history on file.      Home Medications    Prior to Admission medications   Medication Sig Start Date End Date Taking? Authorizing Provider  amLODipine (NORVASC) 10 MG tablet TAKE 1 TABLET (10 MG TOTAL) BY MOUTH DAILY. 07/21/18   [name], NP  atorvastatin (LIPITOR) 40 MG tablet TAKE 1 TABLET (40 MG TOTAL) BY MOUTH DAILY. 02/11/17   [name], NP  BAYER MICROLET LANCETS lancets Use as instructed to check blood sugar three times daily.  DX  E11.65 10/24/17   [name], NP  beclomethasone (QVAR) 80 MCG/ACT inhaler 4 puffs twice daily for next 3 days, then 2 puffs twice daily 01/05/18   [name], NP  clindamycin (CLEOCIN) 300 MG capsule Take 1 capsule (300 mg total) by mouth 2 (two) times daily. 10/30/18   Lysle Yero, Bea Graff, PA-C  FLUoxetine (PROZAC) 10 MG tablet 1/2 tab by mouth once daily for 1 week, then increase to a full tablet once daily on week two 09/29/18   [name], NP  glucose blood (BAYER CONTOUR TEST) test strip Use to check blood sugar 3 times per day.  DX  E11.65 10/24/17   [name], NP  HYDROcodone-acetaminophen (NORCO/VICODIN) 5-325 MG tablet Take 1 tablet by mouth every 6 (six) hours as needed for severe pain. 10/30/18   Bridget , Bea Graff, PA-C  Insulin Glargine (LANTUS SOLOSTAR) 100 UNIT/ML Solostar Pen Inject 55 Units into the skin every morning. And pen needles 1/day 02/04/18   Renato Shin, MD  lisinopril (PRINIVIL,ZESTRIL) 20 MG tablet TAKE 1 TABLET (20 MG TOTAL) BY MOUTH DAILY. 07/17/18   [name], NP  pantoprazole (PROTONIX) 40 MG tablet Take 1 tablet (40 mg total) by mouth  daily. 01/05/18   [name], NP  sitaGLIPtin (JANUVIA) 100 MG tablet Take 1 tablet (100 mg total) by mouth daily. 09/29/18   [name], NP  traMADol (ULTRAM) 50 MG tablet Take 1 tablet (50 mg total) by mouth every 8 (eight) hours as needed. Patient not taking: Reported on 10/20/2018 10/01/18   [name], NP  valACYclovir (VALTREX) 1000 MG tablet Take 1 tablet by mouth once daily for 5 days at start of rash 02/20/18   [name], NP    Family History Family History  Problem Relation Age of Onset  . Heart attack Maternal Aunt   . Diabetes Mother   . Cancer Father        oral cancer    Social History Social History   Tobacco Use  . Smoking status: Current Every Day Smoker    Packs/day: 0.50    Years: 10.00    Pack years: 5.00    Types: Cigarettes  . Smokeless tobacco: Never Used  Substance Use Topics  . Alcohol use: Yes    Alcohol/week: 0.0 standard drinks    Comment: occasional use  . Drug use: No     Allergies   Ibuprofen; Labetalol; and Aspirin   Review of Systems Review of Systems  Constitutional: Negative for chills and fever.  HENT: Negative for facial swelling and sore throat.   Respiratory: Negative for shortness of breath.   Cardiovascular: Negative for chest pain.  Gastrointestinal: Negative for abdominal pain, nausea and vomiting.  Genitourinary: Negative for dysuria.  Musculoskeletal: Negative for back pain.  Skin: Positive for wound. Negative for rash.  Neurological: Negative for headaches.  Psychiatric/Behavioral: The patient is not nervous/anxious.      Physical Exam Updated Vital Signs BP (!) 152/87 (BP Location: Right Arm)   Pulse (!) 116   Temp 98.7 F (37.1 C) (Oral)   Resp 20   Ht 5\' 5"  (1.651 m)   Wt 117 kg   LMP 09/13/2011   SpO2 97%   BMI 42.92 kg/m   Physical Exam Vitals signs and nursing note reviewed.  Constitutional:      General: She is not in acute distress.    Appearance: She is  well-developed. She is not diaphoretic.  HENT:     Head: Normocephalic and atraumatic.     Mouth/Throat:     Pharynx: No oropharyngeal exudate.  Eyes:     General: No scleral icterus.       Right eye: No discharge.        Left eye: No discharge.     Conjunctiva/sclera: Conjunctivae normal.     Pupils: Pupils are equal, round, and reactive to light.  Neck:     Musculoskeletal: Normal range of motion and neck supple.     Thyroid: No thyromegaly.  Cardiovascular:     Rate  and Rhythm: Regular rhythm.     Heart sounds: Normal heart sounds. No murmur. No friction rub. No gallop.   Pulmonary:     Effort: Pulmonary effort is normal. No respiratory distress.     Breath sounds: Normal breath sounds. No stridor. No wheezing or rales.  Abdominal:     General: Bowel sounds are normal. There is no distension.     Palpations: Abdomen is soft.     Tenderness: There is no abdominal tenderness. There is no guarding or rebound.  Genitourinary:    Comments: No tenderness around the rectum or anus Musculoskeletal:       Legs:  Lymphadenopathy:     Cervical: No cervical adenopathy.  Skin:    General: Skin is warm and dry.     Coloration: Skin is not pale.     Findings: No rash.  Neurological:     Mental Status: She is alert.     Coordination: Coordination normal.  Psychiatric:     Comments: Anxious, tearful      ED Treatments / Results  Labs (all labs ordered are listed, but only abnormal results are displayed) Labs Reviewed - No data to display  EKG None  Radiology No results found.  Procedures Procedures (including critical care time)  Medications Ordered in ED Medications - No data to display   Initial Impression / Assessment and Plan / ED Course  I have reviewed the triage vital signs and the nursing notes.  Pertinent labs & imaging results that were available during my care of the patient were reviewed by me and considered in my medical decision making (see chart for  details).     Patient presenting with hidradenitis separative and recurrent abscesses most of her life.  Bedside ultrasound was conducted today and there does appear to be a fluid collection, however patient pleads not to have an I&D today.  Considering it is actively draining and this is very recurrent, I feel it is reasonable to try warm compresses and clindamycin instead of Keflex.  Patient will be referred to a general surgeon.  Patient advised to continue Hibiclens.  Patient advised to have wound area checked in 2 to 3 days.  Patient advised return she develops any increasing pain, surrounding redness, swelling, red streaking from the area.  No signs of perirectal or perianal abscess today.  Patient will be discharged home with clindamycin and short course of Norco.  Patient advised to continue Naprosyn.  Patient understands that I&D will probably be the definitive treatment, however patient states she has had so many and does not wish to proceed with that today.  Patient understands and agrees with plan.  Patient vitals stable and discharged in satisfactory condition.  Final Clinical Impressions(s) / ED Diagnoses   Final diagnoses:  Abscess of buttock, left  Hidradenitis suppurativa    ED Discharge Orders         Ordered    clindamycin (CLEOCIN) 300 MG capsule  2 times daily     10/30/18 1142    HYDROcodone-acetaminophen (NORCO/VICODIN) 5-325 MG tablet  Every 6 hours PRN     10/30/18 [address] [place], PA-C 10/30/18 1143    Duffy Bruce, MD 11/02/18 1010

## 2018-10-30 NOTE — Discharge Instructions (Signed)
Take clindamycin until completed.  Continue warm soaks and warm compresses.  Continue washing with Hibiclens as we discussed.  Continue taking Naprosyn for pain.  For severe pain, you can take 1 Vicodin every 6 hours.  Please have your doctor see you in 2 to 3 days for wound recheck, or return here.  Incision and drainage may end up being the definitive treatment, however you may also need to see a surgeon.  Please return to the emergency department sooner if you develop any increasing pain, redness, swelling, red streaking from the area, or fever over 100.4.  Do not drink alcohol, drive, operate machinery or participate in any other potentially dangerous activities while taking opiate pain medication as it may make you sleepy. Do not take this medication with any other sedating medications, either prescription or over-the-counter. If you were prescribed Percocet or Vicodin, do not take these with acetaminophen (Tylenol) as it is already contained within these medications and overdose of Tylenol is dangerous.   This medication is an opiate (or narcotic) pain medication and can be habit forming.  Use it as little as possible to achieve adequate pain control.  Do not use or use it with extreme caution if you have a history of opiate abuse or dependence. This medication is intended for your use only - do not give any to anyone else and keep it in a secure place where nobody else, especially children, have access to it. It will also cause or worsen constipation, so you may want to consider taking an over-the-counter stool softener while you are taking this medication.

## 2018-10-30 NOTE — ED Triage Notes (Signed)
Presents with abscess to buttock at gluteal folds, she reports it has been there for months and it opens and drains and then gets large again. She is tearful and states it hurts and is causing her depression.

## 2018-11-03 ENCOUNTER — Encounter: Payer: Self-pay | Admitting: Family

## 2018-11-04 ENCOUNTER — Emergency Department (HOSPITAL_BASED_OUTPATIENT_CLINIC_OR_DEPARTMENT_OTHER)
Admission: EM | Admit: 2018-11-04 | Discharge: 2018-11-04 | Disposition: A | Discharge status: Home / Self Care | Payer: 59 | Attending: Emergency Medicine | Admitting: Emergency Medicine

## 2018-11-04 ENCOUNTER — Encounter (HOSPITAL_BASED_OUTPATIENT_CLINIC_OR_DEPARTMENT_OTHER): Payer: Self-pay | Admitting: Emergency Medicine

## 2018-11-04 ENCOUNTER — Other Ambulatory Visit: Payer: Self-pay

## 2018-11-04 DIAGNOSIS — E119 Type 2 diabetes mellitus without complications: Secondary | ICD-10-CM | POA: Diagnosis not present

## 2018-11-04 DIAGNOSIS — Z794 Long term (current) use of insulin: Secondary | ICD-10-CM | POA: Insufficient documentation

## 2018-11-04 DIAGNOSIS — I10 Essential (primary) hypertension: Secondary | ICD-10-CM | POA: Insufficient documentation

## 2018-11-04 DIAGNOSIS — Z79899 Other long term (current) drug therapy: Secondary | ICD-10-CM | POA: Diagnosis not present

## 2018-11-04 DIAGNOSIS — F17210 Nicotine dependence, cigarettes, uncomplicated: Secondary | ICD-10-CM | POA: Insufficient documentation

## 2018-11-04 DIAGNOSIS — L0231 Cutaneous abscess of buttock: Secondary | ICD-10-CM | POA: Insufficient documentation

## 2018-11-04 MED ORDER — HYDROCODONE-ACETAMINOPHEN 5-325 MG PO TABS
1.0000 | ORAL_TABLET | Freq: Once | ORAL | Status: AC
  Administered 2018-11-04: 1 via ORAL
  Filled 2018-11-04: qty 1

## 2018-11-04 NOTE — ED Provider Notes (Signed)
[facility] Provider Note   CSN: [id number] Arrival date & time: 11/04/18  1027     History   Chief Complaint Chief Complaint  Patient presents with  . Abscess    HPI Catherine Espinoza is a 43 y.o. female.  Patient with history of recurrent boils presents to the emergency department today with complaint of worsening boil on the left buttocks.  Patient was here on 1/31 and prescribed clindamycin.  She refused I&D at that time.  She states that the area has been draining for the past 3 days however has become more painful and bigger.  She is here requesting drainage.  She states that she has follow-up with her primary care doctor in 2 days.  She has also noted a small area in the left axilla related to what she states is hidradenitis suppurativa.  Patient denies any fever, nausea, vomiting, or diarrhea.  Has been taking Vicodin for pain.  Onset of symptoms insidious.  Course is waxing and waning.  Palpation and pressure make the pain worse.     Past Medical History:  Diagnosis Date  . Anemia, unspecified   . Excessive or frequent menstruation   . Morbid obesity ([place])   . Obstructive sleep apnea 02/11/11   Sleep study: severe OSA- rec CPAP 20cm small  full face mask  . Proteinuria   . Type II or unspecified type diabetes mellitus without mention of complication, not stated as uncontrolled   . Unspecified essential hypertension     Patient Active Problem List   Diagnosis Date Noted  . Preventative health care 06/21/2016  . GERD (gastroesophageal reflux disease) 12/25/2014  . Onychomycosis 08/11/2014  . HTN (hypertension) 10/11/2013  . Hidradenitis suppurativa 08/02/2011  . Fatigue 08/02/2011  . Obstructive sleep apnea 02/15/2011  . Hypertension 02/15/2011  . Hyperlipidemia 01/16/2011  . PROTEINURIA 11/16/2009  . Diabetes type 2, uncontrolled ([place]) 11/03/2009  . ANEMIA 11/03/2009  . MORBID OBESITY 10/31/2009  . TOBACCO ABUSE 10/31/2009     Past Surgical History:  Procedure Laterality Date  . ABDOMINAL HYSTERECTOMY    . ABLATION COLPOCLESIS    . CESAREAN SECTION    . CLEFT PALATE REPAIR    . cyst removal    . ECTOPIC PREGNANCY SURGERY     x 2  . OOPHORECTOMY Right 2002     OB History   No obstetric history on file.      Home Medications    Prior to Admission medications   Medication Sig Start Date End Date Taking? Authorizing Provider  amLODipine (NORVASC) 10 MG tablet TAKE 1 TABLET (10 MG TOTAL) BY MOUTH DAILY. 07/21/18   [name], NP  atorvastatin (LIPITOR) 40 MG tablet TAKE 1 TABLET (40 MG TOTAL) BY MOUTH DAILY. 02/11/17   [name], NP  BAYER MICROLET LANCETS lancets Use as instructed to check blood sugar three times daily.  DX  E11.65 10/24/17   [name], NP  beclomethasone (QVAR) 80 MCG/ACT inhaler 4 puffs twice daily for next 3 days, then 2 puffs twice daily 01/05/18   [name], NP  clindamycin (CLEOCIN) 300 MG capsule Take 1 capsule (300 mg total) by mouth 2 (two) times daily. 10/30/18   Law, Bea Graff, PA-C  FLUoxetine (PROZAC) 10 MG tablet 1/2 tab by mouth once daily for 1 week, then increase to a full tablet once daily on week two 09/29/18   [name], NP  glucose blood (BAYER CONTOUR TEST) test strip Use to check blood sugar 3  times per day.  DX  E11.65 10/24/17   [name], NP  HYDROcodone-acetaminophen (NORCO/VICODIN) 5-325 MG tablet Take 1 tablet by mouth every 6 (six) hours as needed for severe pain. 10/30/18   Law, Bea Graff, PA-C  Insulin Glargine (LANTUS SOLOSTAR) 100 UNIT/ML Solostar Pen Inject 55 Units into the skin every morning. And pen needles 1/day 02/04/18   Renato Shin, MD  lisinopril (PRINIVIL,ZESTRIL) 20 MG tablet TAKE 1 TABLET (20 MG TOTAL) BY MOUTH DAILY. 07/17/18   [name], NP  pantoprazole (PROTONIX) 40 MG tablet Take 1 tablet (40 mg total) by mouth daily. 01/05/18   [name], NP  sitaGLIPtin (JANUVIA)  100 MG tablet Take 1 tablet (100 mg total) by mouth daily. 09/29/18   [name], NP  traMADol (ULTRAM) 50 MG tablet Take 1 tablet (50 mg total) by mouth every 8 (eight) hours as needed. Patient not taking: Reported on 10/20/2018 10/01/18   [name], NP  valACYclovir (VALTREX) 1000 MG tablet Take 1 tablet by mouth once daily for 5 days at start of rash 02/20/18   [name], NP    Family History Family History  Problem Relation Age of Onset  . Heart attack Maternal Aunt   . Diabetes Mother   . Cancer Father        oral cancer    Social History Social History   Tobacco Use  . Smoking status: Current Every Day Smoker    Packs/day: 0.50    Years: 10.00    Pack years: 5.00    Types: Cigarettes  . Smokeless tobacco: Never Used  Substance Use Topics  . Alcohol use: Yes    Alcohol/week: 0.0 standard drinks    Comment: occasional use  . Drug use: No     Allergies   Ibuprofen; Labetalol; and Aspirin   Review of Systems Review of Systems  Constitutional: Negative for fever.  Gastrointestinal: Negative for nausea and vomiting.  Skin: Negative for color change.       Positive for abscess.  Hematological: Negative for adenopathy.     Physical Exam Updated Vital Signs BP (!) 156/95 (BP Location: Right Arm)   Pulse (!) 117   Temp 98.5 F (36.9 C) (Oral)   Resp 18   Ht 5\' 6"  (1.676 m)   Wt 113.4 kg   LMP 09/13/2011   SpO2 95%   BMI 40.35 kg/m   Physical Exam Vitals signs and nursing note reviewed.  Constitutional:      General: She is in acute distress (Tearful, crying).     Appearance: She is well-developed.  HENT:     Head: Normocephalic and atraumatic.  Eyes:     Conjunctiva/sclera: Conjunctivae normal.  Neck:     Musculoskeletal: Normal range of motion and neck supple.  Pulmonary:     Effort: No respiratory distress.  Skin:    General: Skin is warm and dry.     Comments: There is an approximately 4 cm area of induration to the  left buttocks which is extremely tender.  There is a scant amount of active drainage from the head of the abscess.  Neurological:     Mental Status: She is alert.      ED Treatments / Results  Labs (all labs ordered are listed, but only abnormal results are displayed) Labs Reviewed - No data to display  EKG None  Radiology No results found.  Procedures .Marland KitchenIncision and Drainage Date/Time: 11/04/2018 11:08 AM Performed by: Carlisle Cater, PA-C Authorized by: Carlisle Cater,  PA-C   Consent:    Consent obtained:  Verbal   Consent given by:  Patient   Risks discussed:  Bleeding, incomplete drainage, pain and infection   Alternatives discussed:  No treatment Location:    Type:  Abscess   Size:  4cm   Location:  Lower extremity   Lower extremity location:  Buttock   Buttock location:  L buttock Pre-procedure details:    Skin preparation:  Betadine Anesthesia (see MAR for exact dosages):    Anesthesia method:  Local infiltration   Local anesthetic:  Lidocaine 2% WITH epi Procedure type:    Complexity:  Simple Procedure details:    Incision types:  Stab incision   Scalpel blade:  11   Drainage:  Purulent and bloody   Drainage amount:  Copious   Wound treatment:  Wound left open   Packing materials:  None Post-procedure details:    Patient tolerance of procedure:  Tolerated with difficulty Comments:     Small incision made, smaller than I would have liked. Pt unable to tolerate extension of incision or probing with hemostat.    (including critical care time)  Medications Ordered in ED Medications  HYDROcodone-acetaminophen (NORCO/VICODIN) 5-325 MG per tablet 1 tablet (has no administration in time range)     Initial Impression / Assessment and Plan / ED Course  I have reviewed the triage vital signs and the nursing notes.  Pertinent labs & imaging results that were available during my care of the patient were reviewed by me and considered in my medical decision  making (see chart for details).     Patient seen and examined.  Discussed incision and drainage procedure.  She has had this multiple times before.  Patient is very fearful but states she wants to proceed given that it hurts so much.  Vital signs reviewed and are as follows: BP (!) 156/95 (BP Location: Right Arm)   Pulse (!) 117   Temp 98.5 F (36.9 C) (Oral)   Resp 18   Ht 5\' 6"  (1.676 m)   Wt 113.4 kg   LMP 09/13/2011   SpO2 95%   BMI 40.35 kg/m   I&D performed as best as possible.  Patient unable to fully cooperate.  Encourage patient to continue warm soaks, PCP follow-up in 2 days.  She will continue clindamycin.  Vicodin ordered here.  Final Clinical Impressions(s) / ED Diagnoses   Final diagnoses:  Abscess of left buttock   Patient with recurrent abscesses.  I&D performed as best as possible today.  No signs of sepsis or cellulitis.  She is already taking clindamycin.  She has follow-up with her doctor in 2 days.  ED Discharge Orders    None       Carlisle Cater, Hershal Coria 11/04/18 1111    Drenda Freeze, MD 11/04/18 [phone]

## 2018-11-04 NOTE — Discharge Instructions (Signed)
Please read and follow all provided instructions.  Your diagnoses today include:  1. Abscess of left buttock     Tests performed today include:  Vital signs. See below for your results today.   Medications prescribed:   None  Take any prescribed medications only as directed.   Home care instructions:   Follow any educational materials contained in this packet  Follow-up instructions: See your doctor in 48 hours as planned.  Return instructions:  Return to the Emergency Department if you have:  Fever  Worsening symptoms  Worsening pain  Worsening swelling  Redness of the skin that moves away from the affected area, especially if it streaks away from the affected area   Any other emergent concerns  Your vital signs today were: BP (!) 156/95 (BP Location: Right Arm)    Pulse (!) 117    Temp 98.5 F (36.9 C) (Oral)    Resp 18    Ht 5\' 6"  (1.676 m)    Wt 113.4 kg    LMP 09/13/2011    SpO2 95%    BMI 40.35 kg/m  If your blood pressure (BP) was elevated above 135/85 this visit, please have this repeated by your doctor within one month. --------------

## 2018-11-04 NOTE — ED Triage Notes (Signed)
Reports abscess to left buttock.  States this is currently draining.  Reports that she feels like she needs this opened more at present.

## 2018-11-06 ENCOUNTER — Ambulatory Visit: Payer: 59 | Admitting: Family

## 2018-11-06 ENCOUNTER — Encounter: Payer: Self-pay | Admitting: Family

## 2018-11-06 VITALS — BP 126/81 | HR 116 | Temp 98.9°F | Resp 16 | Ht 65.0 in | Wt 258.0 lb

## 2018-11-06 DIAGNOSIS — L0291 Cutaneous abscess, unspecified: Secondary | ICD-10-CM | POA: Diagnosis not present

## 2018-11-06 DIAGNOSIS — L732 Hidradenitis suppurativa: Secondary | ICD-10-CM | POA: Diagnosis not present

## 2018-11-06 MED ORDER — CLINDAMYCIN HCL 300 MG PO CAPS: 300.0000 mg | ORAL_CAPSULE | Freq: Two times a day (BID) | ORAL | 0 refills | Status: DC

## 2018-11-06 MED ORDER — TRAMADOL HCL 50 MG PO TABS: 50.0000 mg | ORAL_TABLET | Freq: Three times a day (TID) | ORAL | 0 refills | Status: DC | PRN

## 2018-11-06 MED FILL — traMADol HCL 50 MG TABS: 50 | 5 days supply | Qty: 15 | Fill #0

## 2018-11-06 MED FILL — CLINDAMYCIN HCL 300 MG CAPS: 300 | 7 days supply | Qty: 14 | Fill #0

## 2018-11-06 NOTE — Progress Notes (Signed)
Subjective:    Patient ID: Catherine Espinoza, female    DOB: 1975/10/06, 43 y.o.   MRN: [id number]  HPI  Patient is a 43 yr old female who presents today for ED follow up. She was seen in the ED on 11/04/18 where an I and D was performed of the left buttock. She was continued on clindamycin. Clindamycin is now complete.   Patient reports that it continues to drain and "looks better."  Area is still tender.    Review of Systems See HPI  Past Medical History:  Diagnosis Date  . Anemia, unspecified   . Excessive or frequent menstruation   . Morbid obesity ([place])   . Obstructive sleep apnea 02/11/11   Sleep study: severe OSA- rec CPAP 20cm small  full face mask  . Proteinuria   . Type II or unspecified type diabetes mellitus without mention of complication, not stated as uncontrolled   . Unspecified essential hypertension      Social History   Socioeconomic History  . Marital status: Single    Spouse name: Not on file  . Number of children: Not on file  . Years of education: Not on file  . Highest education level: Not on file  Occupational History  . Occupation: cna/med Engineer, production: [employer]  Social Needs  . Financial resource strain: Not on file  . Food insecurity:    Worry: Not on file    Inability: Not on file  . Transportation needs:    Medical: Not on file    Non-medical: Not on file  Tobacco Use  . Smoking status: Current Every Day Smoker    Packs/day: 0.50    Years: 10.00    Pack years: 5.00    Types: Cigarettes  . Smokeless tobacco: Never Used  Substance and Sexual Activity  . Alcohol use: Yes    Alcohol/week: 0.0 standard drinks    Comment: occasional use  . Drug use: No  . Sexual activity: Yes    Birth control/protection: Surgical  Lifestyle  . Physical activity:    Days per week: Not on file    Minutes per session: Not on file  . Stress: Not on file  Relationships  . Social connections:    Talks on phone: Not on file    Gets together:  Not on file    Attends religious service: Not on file    Active member of club or organization: Not on file    Attends meetings of clubs or organizations: Not on file    Relationship status: Not on file  . Intimate partner violence:    Fear of current or ex partner: Not on file    Emotionally abused: Not on file    Physically abused: Not on file    Forced sexual activity: Not on file  Other Topics Concern  . Not on file  Social History Narrative   Catherine Espinoza for divorced   Daughter born 2003   Works as cna/med tech    Past Surgical History:  Procedure Laterality Date  . ABDOMINAL HYSTERECTOMY    . ABLATION COLPOCLESIS    . CESAREAN SECTION    . CLEFT PALATE REPAIR    . cyst removal    . ECTOPIC PREGNANCY SURGERY     x 2  . OOPHORECTOMY Right 2002    Family History  Problem Relation Age of Onset  . Heart attack Maternal Aunt   . Diabetes Mother   . Cancer Father  oral cancer    Allergies  Allergen Reactions  . Ibuprofen     REACTION: knots in mouth with SOB  . Labetalol Nausea Only  . Aspirin     Knots in mouth     Current Outpatient Medications on File Prior to Visit  Medication Sig Dispense Refill  . amLODipine (NORVASC) 10 MG tablet TAKE 1 TABLET (10 MG TOTAL) BY MOUTH DAILY. 30 tablet 2  . atorvastatin (LIPITOR) 40 MG tablet TAKE 1 TABLET (40 MG TOTAL) BY MOUTH DAILY. 30 tablet 5  . BAYER MICROLET LANCETS lancets Use as instructed to check blood sugar three times daily.  DX  E11.65 100 each 5  . beclomethasone (QVAR) 80 MCG/ACT inhaler 4 puffs twice daily for next 3 days, then 2 puffs twice daily 1 Inhaler 0  . clindamycin (CLEOCIN) 300 MG capsule Take 1 capsule (300 mg total) by mouth 2 (two) times daily. 14 capsule 0  . FLUoxetine (PROZAC) 10 MG tablet 1/2 tab by mouth once daily for 1 week, then increase to a full tablet once daily on week two 30 tablet 0  . glucose blood (BAYER CONTOUR TEST) test strip Use to check blood sugar 3 times per day.   DX  E11.65 100 each 5  . HYDROcodone-acetaminophen (NORCO/VICODIN) 5-325 MG tablet Take 1 tablet by mouth every 6 (six) hours as needed for severe pain. 8 tablet 0  . Insulin Glargine (LANTUS SOLOSTAR) 100 UNIT/ML Solostar Pen Inject 55 Units into the skin every morning. And pen needles 1/day 30 mL 5  . lisinopril (PRINIVIL,ZESTRIL) 20 MG tablet TAKE 1 TABLET (20 MG TOTAL) BY MOUTH DAILY. 90 tablet 1  . pantoprazole (PROTONIX) 40 MG tablet Take 1 tablet (40 mg total) by mouth daily. 30 tablet 3  . sitaGLIPtin (JANUVIA) 100 MG tablet Take 1 tablet (100 mg total) by mouth daily. 30 tablet 5  . traMADol (ULTRAM) 50 MG tablet Take 1 tablet (50 mg total) by mouth every 8 (eight) hours as needed. 15 tablet 0  . valACYclovir (VALTREX) 1000 MG tablet Take 1 tablet by mouth once daily for 5 days at start of rash 15 tablet 1   No current facility-administered medications on file prior to visit.     BP 126/81 (BP Location: Right Arm, Patient Position: Sitting, Cuff Size: Large)   Pulse (!) 116   Temp 98.9 F (37.2 C) (Oral)   Resp 16   Ht 5\' 5"  (1.651 m)   Wt 258 lb (117 kg)   LMP 09/13/2011   SpO2 98%   BMI 42.93 kg/m       Objective:   Physical Exam Constitutional:      Appearance: Normal appearance.  Skin:    Comments: Firm/tender induration left buttock medially.  No obvious fluctuance.   Neurological:     Mental Status: She is alert and oriented to person, place, and time.  Psychiatric:        Mood and Affect: Mood normal.        Behavior: Behavior normal.           Assessment & Plan:  Abscess- improving but not resolved. I recommended surgical consult so that they can potentially perform an [place] to see if there is a deeper drainable abscess.  Restart clindamycin.  Hidradentitis suppurativa- she is interested in pursuing humira therapy for prevention. Will defer to surgery.  If they are not comfortable prescribing would consider dermatology referral.

## 2018-11-06 NOTE — Patient Instructions (Signed)
Please restart clindamycin.  You should be contacted about your referral to the surgeon.

## 2018-11-09 ENCOUNTER — Ambulatory Visit (INDEPENDENT_AMBULATORY_CARE_PROVIDER_SITE_OTHER): Payer: Self-pay | Admitting: Family Medicine

## 2018-11-09 ENCOUNTER — Telehealth: Payer: Self-pay | Admitting: *Deleted

## 2018-11-10 NOTE — Telephone Encounter (Signed)
Received FMLA/STD paperwork from [employer]; completed as much as possible; forwarded to provider/SLS 02/11

## 2018-11-17 NOTE — Telephone Encounter (Signed)
Paperwork faxed with confirmation/SLS 02/17

## 2018-11-23 ENCOUNTER — Ambulatory Visit (INDEPENDENT_AMBULATORY_CARE_PROVIDER_SITE_OTHER): Payer: Self-pay | Admitting: Family Medicine

## 2018-11-23 ENCOUNTER — Ambulatory Visit: Payer: 59 | Admitting: Family

## 2018-11-23 ENCOUNTER — Encounter: Payer: Self-pay | Admitting: Family

## 2018-11-23 VITALS — BP 142/95 | HR 121 | Temp 98.0°F | Resp 18 | Ht 65.0 in | Wt 257.0 lb

## 2018-11-23 DIAGNOSIS — M549 Dorsalgia, unspecified: Secondary | ICD-10-CM | POA: Diagnosis not present

## 2018-11-23 DIAGNOSIS — L732 Hidradenitis suppurativa: Secondary | ICD-10-CM | POA: Diagnosis not present

## 2018-11-23 DIAGNOSIS — I10 Essential (primary) hypertension: Secondary | ICD-10-CM

## 2018-11-23 MED ORDER — METHOCARBAMOL 500 MG PO TABS: 500.0000 mg | ORAL_TABLET | Freq: Three times a day (TID) | ORAL | 0 refills | Status: DC | PRN

## 2018-11-23 MED ORDER — NAPROXEN 500 MG PO TABS: 500.0000 mg | ORAL_TABLET | Freq: Two times a day (BID) | ORAL | 0 refills | Status: DC | PRN

## 2018-11-23 MED ORDER — GABAPENTIN 100 MG PO CAPS: 100.0000 mg | ORAL_CAPSULE | Freq: Three times a day (TID) | ORAL | 3 refills | Status: DC

## 2018-11-23 MED FILL — NAPROXEN 500 MG TABLET: 500 | 15 days supply | Qty: 30 | Fill #0

## 2018-11-23 MED FILL — METHOCARBAMOL 500 MG TABLET: 500 | 10 days supply | Qty: 30 | Fill #0

## 2018-11-23 MED FILL — GABAPENTIN 100 MG CAPSULE: 100 | 30 days supply | Qty: 90 | Fill #0

## 2018-11-23 NOTE — Patient Instructions (Signed)
Please begin naproxen as needed for pain. Begin gabapentin 3x daily for pain. You may also use robaxin as needed for muscle spasm. Keep your upcoming appointment with the surgeon.

## 2018-11-23 NOTE — Progress Notes (Signed)
Subjective:    Patient ID: Catherine Espinoza, female    DOB: Apr 28, 1976, 43 y.o.   MRN: [id number]  HPI  Patient is a 43 year old female who presents today with chief complaint of back pain.      Reports that her buttock still hurts. Reports "constant pain."  Reports that it feels like her muscles are spasmming and locking up on her. Hurts so bad that it itches.  Had pain up under the left breast wrapping around the left side.  She reports that the pain is 10 out of 10.  Reports that has been present for several months but until now her abscess pain has overpowered the back pain.  Between the hidradenitis and her back pain she reports that she is no longer able to work and had to quit her job.  Hidradenitis suppurativa- reports she continues to have pain in her buttock.  Has upcoming appointment with surgery.  Review of Systems See HPI  Past Medical History:  Diagnosis Date  . Anemia, unspecified   . Excessive or frequent menstruation   . Morbid obesity ([place])   . Obstructive sleep apnea 02/11/11   Sleep study: severe OSA- rec CPAP 20cm small  full face mask  . Proteinuria   . Type II or unspecified type diabetes mellitus without mention of complication, not stated as uncontrolled   . Unspecified essential hypertension      Social History   Socioeconomic History  . Marital status: Single    Spouse name: Not on file  . Number of children: Not on file  . Years of education: Not on file  . Highest education level: Not on file  Occupational History  . Occupation: cna/med Engineer, production: [employer]  Social Needs  . Financial resource strain: Not on file  . Food insecurity:    Worry: Not on file    Inability: Not on file  . Transportation needs:    Medical: Not on file    Non-medical: Not on file  Tobacco Use  . Smoking status: Current Every Day Smoker    Packs/day: 0.50    Years: 10.00    Pack years: 5.00    Types: Cigarettes  . Smokeless tobacco: Never Used    Substance and Sexual Activity  . Alcohol use: Yes    Alcohol/week: 0.0 standard drinks    Comment: occasional use  . Drug use: No  . Sexual activity: Yes    Birth control/protection: Surgical  Lifestyle  . Physical activity:    Days per week: Not on file    Minutes per session: Not on file  . Stress: Not on file  Relationships  . Social connections:    Talks on phone: Not on file    Gets together: Not on file    Attends religious service: Not on file    Active member of club or organization: Not on file    Attends meetings of clubs or organizations: Not on file    Relationship status: Not on file  . Intimate partner violence:    Fear of current or ex partner: Not on file    Emotionally abused: Not on file    Physically abused: Not on file    Forced sexual activity: Not on file  Other Topics Concern  . Not on file  Social History Narrative   Gerome Sam for divorced   Daughter born 2003   Works as cna/med tech    Past Surgical History:  Procedure Laterality Date  .  ABDOMINAL HYSTERECTOMY    . ABLATION COLPOCLESIS    . CESAREAN SECTION    . CLEFT PALATE REPAIR    . cyst removal    . ECTOPIC PREGNANCY SURGERY     x 2  . OOPHORECTOMY Right 2002    Family History  Problem Relation Age of Onset  . Heart attack Maternal Aunt   . Diabetes Mother   . Cancer Father        oral cancer    Allergies  Allergen Reactions  . Ibuprofen     REACTION: knots in mouth with SOB  . Labetalol Nausea Only  . Aspirin     Knots in mouth     Current Outpatient Medications on File Prior to Visit  Medication Sig Dispense Refill  . amLODipine (NORVASC) 10 MG tablet TAKE 1 TABLET (10 MG TOTAL) BY MOUTH DAILY. 30 tablet 2  . atorvastatin (LIPITOR) 40 MG tablet TAKE 1 TABLET (40 MG TOTAL) BY MOUTH DAILY. 30 tablet 5  . BAYER MICROLET LANCETS lancets Use as instructed to check blood sugar three times daily.  DX  E11.65 100 each 5  . beclomethasone (QVAR) 80 MCG/ACT inhaler 4  puffs twice daily for next 3 days, then 2 puffs twice daily 1 Inhaler 0  . clindamycin (CLEOCIN) 300 MG capsule Take 1 capsule (300 mg total) by mouth 2 (two) times daily. 14 capsule 0  . FLUoxetine (PROZAC) 10 MG tablet 1/2 tab by mouth once daily for 1 week, then increase to a full tablet once daily on week two 30 tablet 0  . glucose blood (BAYER CONTOUR TEST) test strip Use to check blood sugar 3 times per day.  DX  E11.65 100 each 5  . HYDROcodone-acetaminophen (NORCO/VICODIN) 5-325 MG tablet Take 1 tablet by mouth every 6 (six) hours as needed for severe pain. 8 tablet 0  . Insulin Glargine (LANTUS SOLOSTAR) 100 UNIT/ML Solostar Pen Inject 55 Units into the skin every morning. And pen needles 1/day 30 mL 5  . lisinopril (PRINIVIL,ZESTRIL) 20 MG tablet TAKE 1 TABLET (20 MG TOTAL) BY MOUTH DAILY. 90 tablet 1  . pantoprazole (PROTONIX) 40 MG tablet Take 1 tablet (40 mg total) by mouth daily. 30 tablet 3  . sitaGLIPtin (JANUVIA) 100 MG tablet Take 1 tablet (100 mg total) by mouth daily. 30 tablet 5  . traMADol (ULTRAM) 50 MG tablet Take 1 tablet (50 mg total) by mouth every 8 (eight) hours as needed. 15 tablet 0  . valACYclovir (VALTREX) 1000 MG tablet Take 1 tablet by mouth once daily for 5 days at start of rash 15 tablet 1   No current facility-administered medications on file prior to visit.     BP (!) 142/95 (BP Location: Left Arm, Patient Position: Sitting, Cuff Size: Large)   Pulse (!) 121   Temp 98 F (36.7 C) (Oral)   Resp 18   Ht 5\' 5"  (1.651 m)   Wt 257 lb (116.6 kg)   LMP 09/13/2011   SpO2 97%   BMI 42.77 kg/m       Objective:   Physical Exam Constitutional:      Appearance: She is well-developed.     Comments: Uncomfortable appearing  Neck:     Musculoskeletal: Neck supple.     Thyroid: No thyromegaly.  Cardiovascular:     Rate and Rhythm: Normal rate and regular rhythm.     Heart sounds: Normal heart sounds. No murmur.  Pulmonary:     Effort: Pulmonary effort is  normal. No respiratory distress.     Breath sounds: Normal breath sounds. No wheezing.  Musculoskeletal:     Comments: + tenderness to palpation across back.    Skin:    General: Skin is warm and dry.     Comments: Some firm induration noted left buttock but no flucuance no erythema and it is smaller in size  No skin rash noted  Neurological:     Mental Status: She is alert and oriented to person, place, and time.  Psychiatric:        Behavior: Behavior normal.        Thought Content: Thought content normal.        Judgment: Judgment normal.     Comments: tearful           Assessment & Plan:  Musculoskeletal back pain-the possibility of early shingles outbreak crossed my mind however she is having bilateral symptoms which makes this less likely.  I have advised her to keep a look out for any potential blisters or rash.  She is not a good candidate for prednisone due to her uncontrolled diabetes.  She has had allergic reaction to ibuprofen and aspirin in the past but has tolerated naproxen without side effect or reaction.  Will give a trial of naproxen, as needed Robaxin, and add 3 times daily gabapentin for pain.  Of note she had a thoracic spine x-ray back in 2019 which was normal.  Hidradenitis suppurativa-some improvement to abscess on the left buttock.  She is scheduled to see surgery later this week.  I have encouraged her to keep this appointment.  Hypertension-blood pressure is mildly elevated today.  Likely exacerbated by pain.  Plan to recheck at her 1 month follow-up.

## 2018-11-26 DIAGNOSIS — L732 Hidradenitis suppurativa: Secondary | ICD-10-CM | POA: Diagnosis not present

## 2018-12-03 ENCOUNTER — Encounter: Payer: Self-pay | Admitting: Family

## 2018-12-03 DIAGNOSIS — M546 Pain in thoracic spine: Secondary | ICD-10-CM

## 2018-12-03 DIAGNOSIS — L0889 Other specified local infections of the skin and subcutaneous tissue: Secondary | ICD-10-CM | POA: Diagnosis not present

## 2018-12-03 DIAGNOSIS — L732 Hidradenitis suppurativa: Secondary | ICD-10-CM | POA: Diagnosis not present

## 2018-12-03 MED ORDER — TRAMADOL HCL 50 MG PO TABS: 50.0000 mg | ORAL_TABLET | Freq: Three times a day (TID) | ORAL | 0 refills | Status: DC | PRN

## 2018-12-03 MED FILL — traMADol HCL 50 MG TABS: 50 | 5 days supply | Qty: 15 | Fill #0

## 2018-12-04 ENCOUNTER — Ambulatory Visit (INDEPENDENT_AMBULATORY_CARE_PROVIDER_SITE_OTHER): Payer: 59 | Admitting: Pharmacist

## 2018-12-04 DIAGNOSIS — Z79899 Other long term (current) drug therapy: Secondary | ICD-10-CM

## 2018-12-04 MED ORDER — ADALIMUMAB 40 MG/0.8ML ~~LOC~~ PSKT: 1.0000 | PREFILLED_SYRINGE | SUBCUTANEOUS | 0 refills | Status: DC

## 2018-12-04 MED ORDER — ADALIMUMAB 40 MG/0.8ML ~~LOC~~ PSKT: 1.0000 | PREFILLED_SYRINGE | SUBCUTANEOUS | 4 refills | Status: DC

## 2018-12-04 NOTE — Addendum Note (Signed)
Addended by: [name] on: 12/04/2018 02:12 PM   Modules accepted: Orders

## 2018-12-04 NOTE — Progress Notes (Signed)
   S: Patient presents to [facility] for review of their specialty medication therapy.  Patient is currently taking Humira for hidrenitis suppurativa. Patient is managed by Danella Sensing for this.   Adherence: has not started yet  Efficacy: has not started yet  Dosing:  Hidradenitis suppurativa (Humira only): SubQ: Initial: 160 mg (given as four 40 mg injections on day 1 or given as two 40 mg injections per day over 2 consecutive days), then 80 mg 2 weeks later (day 15). Maintenance: 40 mg every week beginning day 29.   Dose adjustments: Renal: no dose adjustments (has not been studied) Hepatic: no dose adjustments (has not been studied)  Screening: TB test: Hepatitis:  Monitoring: S/sx of infection: denies CBC: see below S/sx of hypersensitivity: has not started S/sx of malignancy: has not started S/sx of heart failure: denies  O:     Lab Results  Component Value Date   WBC 8.1 06/23/2018   HGB 15.9 (H) 06/23/2018   HCT 48.2 (H) 06/23/2018   MCV 88.5 06/23/2018   PLT 352.0 06/23/2018      Chemistry      Component Value Date/Time   NA 130 (L) 06/23/2018 1304   K 4.3 06/23/2018 1304   CL 93 (L) 06/23/2018 1304   CO2 28 06/23/2018 1304   BUN 10 06/23/2018 1304   CREATININE 0.64 06/23/2018 1304   CREATININE 0.57 04/20/2013 1353      Component Value Date/Time   CALCIUM 9.8 06/23/2018 1304   ALKPHOS 93 05/09/2018 1946   AST 16 05/09/2018 1946   ALT 20 05/09/2018 1946   BILITOT 0.4 05/09/2018 1946       A/P: 1. Medication review: Patient currently on Humira for hidradenitis suppurativa but has not started it yet. Reviewed the medication with the patient, including the following: Humira is a TNF blocking agent indicated for ankylosing spondylitis, Crohn's disease, Hidradenitis suppurativa, psoriatic arthritis, plaque psoriasis, ulcerative colitis, and uveitis. Patient educated on purpose, proper use and potential adverse effects of Humira. The most  common adverse effects are infections, headache, and injection site reactions. There is the possibility of an increased risk of malignancy but it is not well understood if this increased risk is due to there medication or the disease state. There are rare cases of pancytopenia and aplastic anemia. No recommendations for any changes at this time.   Christella Hartigan, PharmD, BCPS, BCACP, CPP Clinical Pharmacist Practitioner  [phone]

## 2018-12-09 MED FILL — HUMIRA 40 MG/0.8ML PSKT: 40 | 28 days supply | Qty: 2 | Fill #0

## 2018-12-12 ENCOUNTER — Other Ambulatory Visit: Payer: Self-pay

## 2018-12-12 ENCOUNTER — Ambulatory Visit (HOSPITAL_BASED_OUTPATIENT_CLINIC_OR_DEPARTMENT_OTHER)
Admission: RE | Admit: 2018-12-12 | Discharge: 2018-12-12 | Disposition: A | Discharge status: Home / Self Care | Payer: 59 | Source: Ambulatory Visit | Attending: Family | Admitting: Family

## 2018-12-12 DIAGNOSIS — M546 Pain in thoracic spine: Secondary | ICD-10-CM | POA: Insufficient documentation

## 2018-12-13 ENCOUNTER — Telehealth: Payer: Self-pay | Admitting: Family

## 2018-12-13 DIAGNOSIS — M519 Unspecified thoracic, thoracolumbar and lumbosacral intervertebral disc disorder: Secondary | ICD-10-CM

## 2018-12-13 NOTE — Telephone Encounter (Signed)
See mychart.  

## 2018-12-14 ENCOUNTER — Encounter: Payer: Self-pay | Admitting: Family

## 2018-12-14 ENCOUNTER — Telehealth: Payer: Self-pay | Admitting: *Deleted

## 2018-12-14 MED ORDER — NAPROXEN 500 MG PO TABS: 500.0000 mg | ORAL_TABLET | Freq: Two times a day (BID) | ORAL | 0 refills | Status: DC | PRN

## 2018-12-14 NOTE — Telephone Encounter (Signed)
.  Received STD paperwork from [facility], completed as much as possible; forwarded to provider/SLS 03/16

## 2018-12-15 ENCOUNTER — Other Ambulatory Visit: Payer: Self-pay

## 2018-12-15 ENCOUNTER — Telehealth: Payer: Self-pay | Admitting: Family

## 2018-12-15 ENCOUNTER — Encounter (HOSPITAL_BASED_OUTPATIENT_CLINIC_OR_DEPARTMENT_OTHER): Payer: Self-pay

## 2018-12-15 ENCOUNTER — Emergency Department (HOSPITAL_BASED_OUTPATIENT_CLINIC_OR_DEPARTMENT_OTHER)
Admission: EM | Admit: 2018-12-15 | Discharge: 2018-12-15 | Disposition: A | Discharge status: Home / Self Care | Payer: 59 | Attending: Emergency Medicine | Admitting: Emergency Medicine

## 2018-12-15 DIAGNOSIS — Z79899 Other long term (current) drug therapy: Secondary | ICD-10-CM | POA: Insufficient documentation

## 2018-12-15 DIAGNOSIS — F17210 Nicotine dependence, cigarettes, uncomplicated: Secondary | ICD-10-CM | POA: Insufficient documentation

## 2018-12-15 DIAGNOSIS — M5414 Radiculopathy, thoracic region: Secondary | ICD-10-CM | POA: Insufficient documentation

## 2018-12-15 DIAGNOSIS — Z794 Long term (current) use of insulin: Secondary | ICD-10-CM | POA: Insufficient documentation

## 2018-12-15 DIAGNOSIS — E119 Type 2 diabetes mellitus without complications: Secondary | ICD-10-CM | POA: Insufficient documentation

## 2018-12-15 MED ORDER — HYDROCODONE-ACETAMINOPHEN 5-325 MG PO TABS: 1.0000 | ORAL_TABLET | Freq: Four times a day (QID) | ORAL | 0 refills | Status: DC | PRN

## 2018-12-15 NOTE — ED Triage Notes (Signed)
Pt c/o chronic back pain for over a year.

## 2018-12-15 NOTE — ED Provider Notes (Signed)
[facility] Provider Note: Georgena Spurling, MD, FACEP  CSN: [id number] MRN: [id number] ARRIVAL: 12/15/18 at [facility]  12/15/18 1:02 AM Franci Guadarrama is a 43 y.o. female with about 6 months of pain in her mid thoracic back.  The pain radiates laterally, more prominently on the left including to her left submammary region.  She has been treated by her PCP with tramadol which she states is not treating the pain adequately.  She was recently also started on naproxen.  She rates her pain as a 10 out of 10, worse with movement or palpation.  She had a recent MRI which showed bulging disc but no critical stenosis.   Past Medical History:  Diagnosis Date  . Anemia, unspecified   . Excessive or frequent menstruation   . Morbid obesity ([place])   . Obstructive sleep apnea 02/11/11   Sleep study: severe OSA- rec CPAP 20cm small  full face mask  . Proteinuria   . Type II or unspecified type diabetes mellitus without mention of complication, not stated as uncontrolled   . Unspecified essential hypertension     Past Surgical History:  Procedure Laterality Date  . ABDOMINAL HYSTERECTOMY    . ABLATION COLPOCLESIS    . CESAREAN SECTION    . CLEFT PALATE REPAIR    . cyst removal    . ECTOPIC PREGNANCY SURGERY     x 2  . OOPHORECTOMY Right 2002    Family History  Problem Relation Age of Onset  . Heart attack Maternal Aunt   . Diabetes Mother   . Cancer Father        oral cancer    Social History   Tobacco Use  . Smoking status: Current Every Day Smoker    Packs/day: 0.50    Years: 10.00    Pack years: 5.00    Types: Cigarettes  . Smokeless tobacco: Never Used  Substance Use Topics  . Alcohol use: Yes    Alcohol/week: 0.0 standard drinks    Comment: occasional use  . Drug use: No    Prior to Admission medications   Medication Sig Start Date End Date Taking? Authorizing Provider  Adalimumab  (HUMIRA) 40 MG/0.8ML PSKT Inject 0.8 mLs (40 mg total) into the skin as directed. Take 160mg  day 1 (4 inj), 80mg  day 15 (2 inj), 40 mg day 29 ( 1 inj). 12/04/18   Tresa Garter, MD  Adalimumab (HUMIRA) 40 MG/0.8ML PSKT Inject 0.8 mLs (40 mg total) into the skin once a week. after completing loading dosages as maintenance. 12/04/18   Tresa Garter, MD  amLODipine (NORVASC) 10 MG tablet TAKE 1 TABLET (10 MG TOTAL) BY MOUTH DAILY. 07/21/18   [name], NP  atorvastatin (LIPITOR) 40 MG tablet TAKE 1 TABLET (40 MG TOTAL) BY MOUTH DAILY. 02/11/17   [name], NP  BAYER MICROLET LANCETS lancets Use as instructed to check blood sugar three times daily.  DX  E11.65 10/24/17   [name], NP  beclomethasone (QVAR) 80 MCG/ACT inhaler 4 puffs twice daily for next 3 days, then 2 puffs twice daily 01/05/18   [name], NP  clindamycin (CLEOCIN) 300 MG capsule Take 1 capsule (300 mg total) by mouth 2 (two) times daily. 11/06/18   [name], NP  FLUoxetine (PROZAC) 10 MG tablet 1/2 tab by mouth once daily for 1 week, then increase to a full tablet once daily on  week two 09/29/18   [name], NP  gabapentin (NEURONTIN) 100 MG capsule Take 1 capsule (100 mg total) by mouth 3 (three) times daily. 11/23/18   [name], NP  glucose blood (BAYER CONTOUR TEST) test strip Use to check blood sugar 3 times per day.  DX  E11.65 10/24/17   [name], NP  Insulin Glargine (LANTUS SOLOSTAR) 100 UNIT/ML Solostar Pen Inject 55 Units into the skin every morning. And pen needles 1/day 02/04/18   Renato Shin, MD  lisinopril (PRINIVIL,ZESTRIL) 20 MG tablet TAKE 1 TABLET (20 MG TOTAL) BY MOUTH DAILY. 07/17/18   [name], NP  methocarbamol (ROBAXIN) 500 MG tablet Take 1 tablet (500 mg total) by mouth every 8 (eight) hours as needed for muscle spasms. 11/23/18   [name], NP  naproxen (NAPROSYN) 500 MG tablet Take 1 tablet (500 mg total) by  mouth 2 (two) times daily as needed. 12/14/18   [name], NP  pantoprazole (PROTONIX) 40 MG tablet Take 1 tablet (40 mg total) by mouth daily. 01/05/18   [name], NP  sitaGLIPtin (JANUVIA) 100 MG tablet Take 1 tablet (100 mg total) by mouth daily. 09/29/18   [name], NP  valACYclovir (VALTREX) 1000 MG tablet Take 1 tablet by mouth once daily for 5 days at start of rash 02/20/18   [name], NP    Allergies Ibuprofen; Labetalol; and Aspirin   REVIEW OF SYSTEMS  Negative except as noted here or in the History of Present Illness.   PHYSICAL EXAMINATION  Initial Vital Signs Blood pressure (!) 180/104, pulse 90, temperature 97.7 F (36.5 C), temperature source Oral, resp. rate 18, height 5\' 6"  (1.676 m), weight 117.9 kg, last menstrual period 09/13/2011, SpO2 96 %.  Examination General: Well-developed, well-nourished female in no acute distress; appearance consistent with age of record HENT: normocephalic; atraumatic Eyes: pupils equal, round and reactive to light; extraocular muscles intact Neck: supple Heart: regular rate and rhythm Lungs: clear to auscultation bilaterally Abdomen: soft; nondistended Back: Bilateral mid thoracic tenderness Extremities: No deformity; full range of motion Neurologic: Awake, alert and oriented; motor function intact in all extremities and symmetric; no facial droop Skin: Warm and dry Psychiatric: Tearful   RESULTS  Summary of this visit's results, reviewed by myself:   EKG Interpretation  Date/Time:    Ventricular Rate:    PR Interval:    QRS Duration:   QT Interval:    QTC Calculation:   R Axis:     Text Interpretation:        Laboratory Studies: No results found for this or any previous visit (from the past 24 hour(s)). Imaging Studies: No results found.  ED COURSE and MDM  Nursing notes and initial vitals signs, including pulse oximetry, reviewed.  Vitals:   12/15/18 0052 12/15/18 0053  12/15/18 0054  BP:   (!) 180/104  Pulse:   90  Resp:   18  Temp: 97.7 F (36.5 C)    TempSrc: Oral    SpO2:   96%  Weight:  117.9 kg   Height:  5\' 6"  (1.676 m)    History and exam consistent with thoracic radiculopathy.  Patient has been referred to a neurosurgeon by her PCP.  We will treat her with a short course of hydrocodone and refer back to her PCP.  PROCEDURES    ED DIAGNOSES     ICD-10-CM   1. Thoracic radiculopathy M54.14        Lindi Abram, MD 12/15/18 0111

## 2018-12-17 NOTE — Telephone Encounter (Signed)
Reviewed old paperwork and dates. Form updated.

## 2018-12-23 MED FILL — NAPROXEN 500 MG TABLET: 500 | 15 days supply | Qty: 30 | Fill #0

## 2019-02-24 MED FILL — GABAPENTIN 100 MG CAPSULE: 100 | 30 days supply | Qty: 90 | Fill #1

## 2019-04-07 IMAGING — DX DG CHEST 2V
2 series · 2 of 2 positions shown · non-contrast
Comparison: March 22, 2018

CLINICAL DATA: Sepsis

EXAM:
CHEST - 2 VIEW

[chest pa]
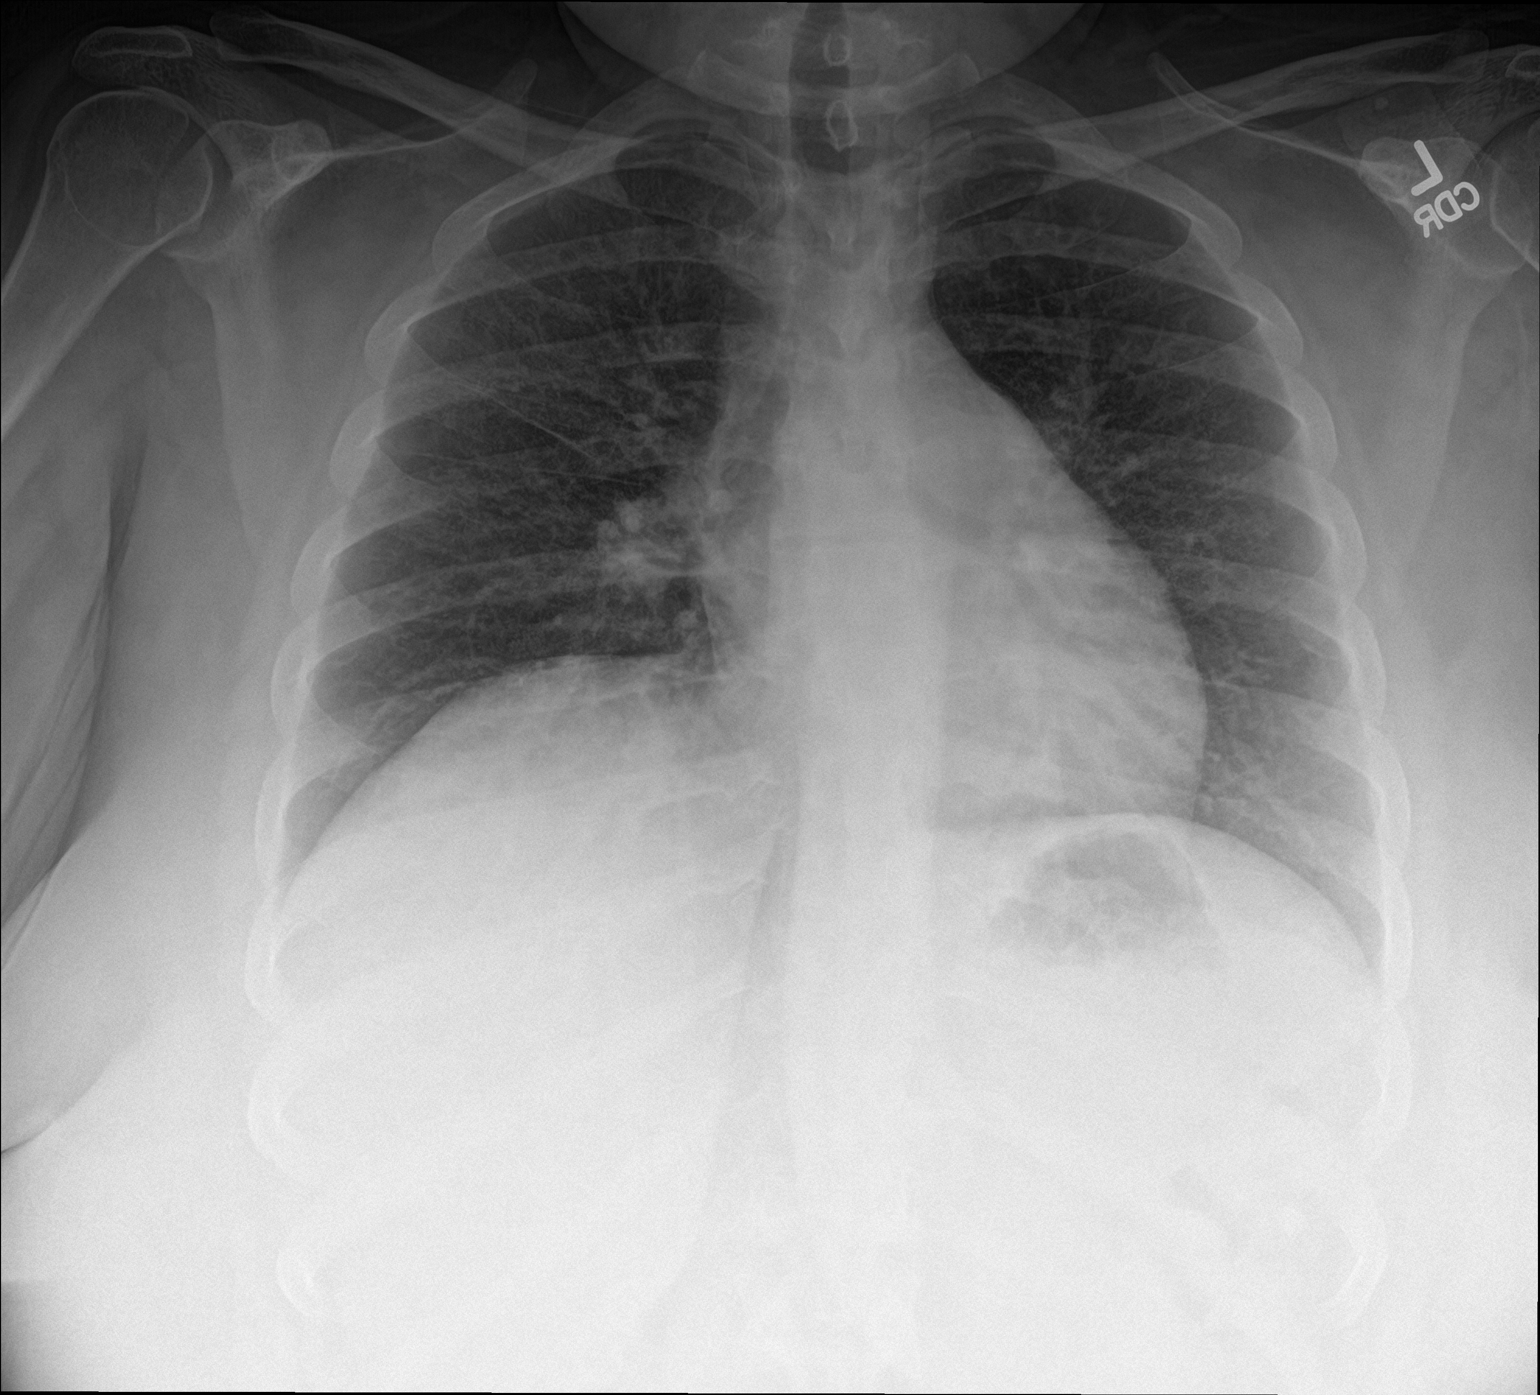

[chest lat]
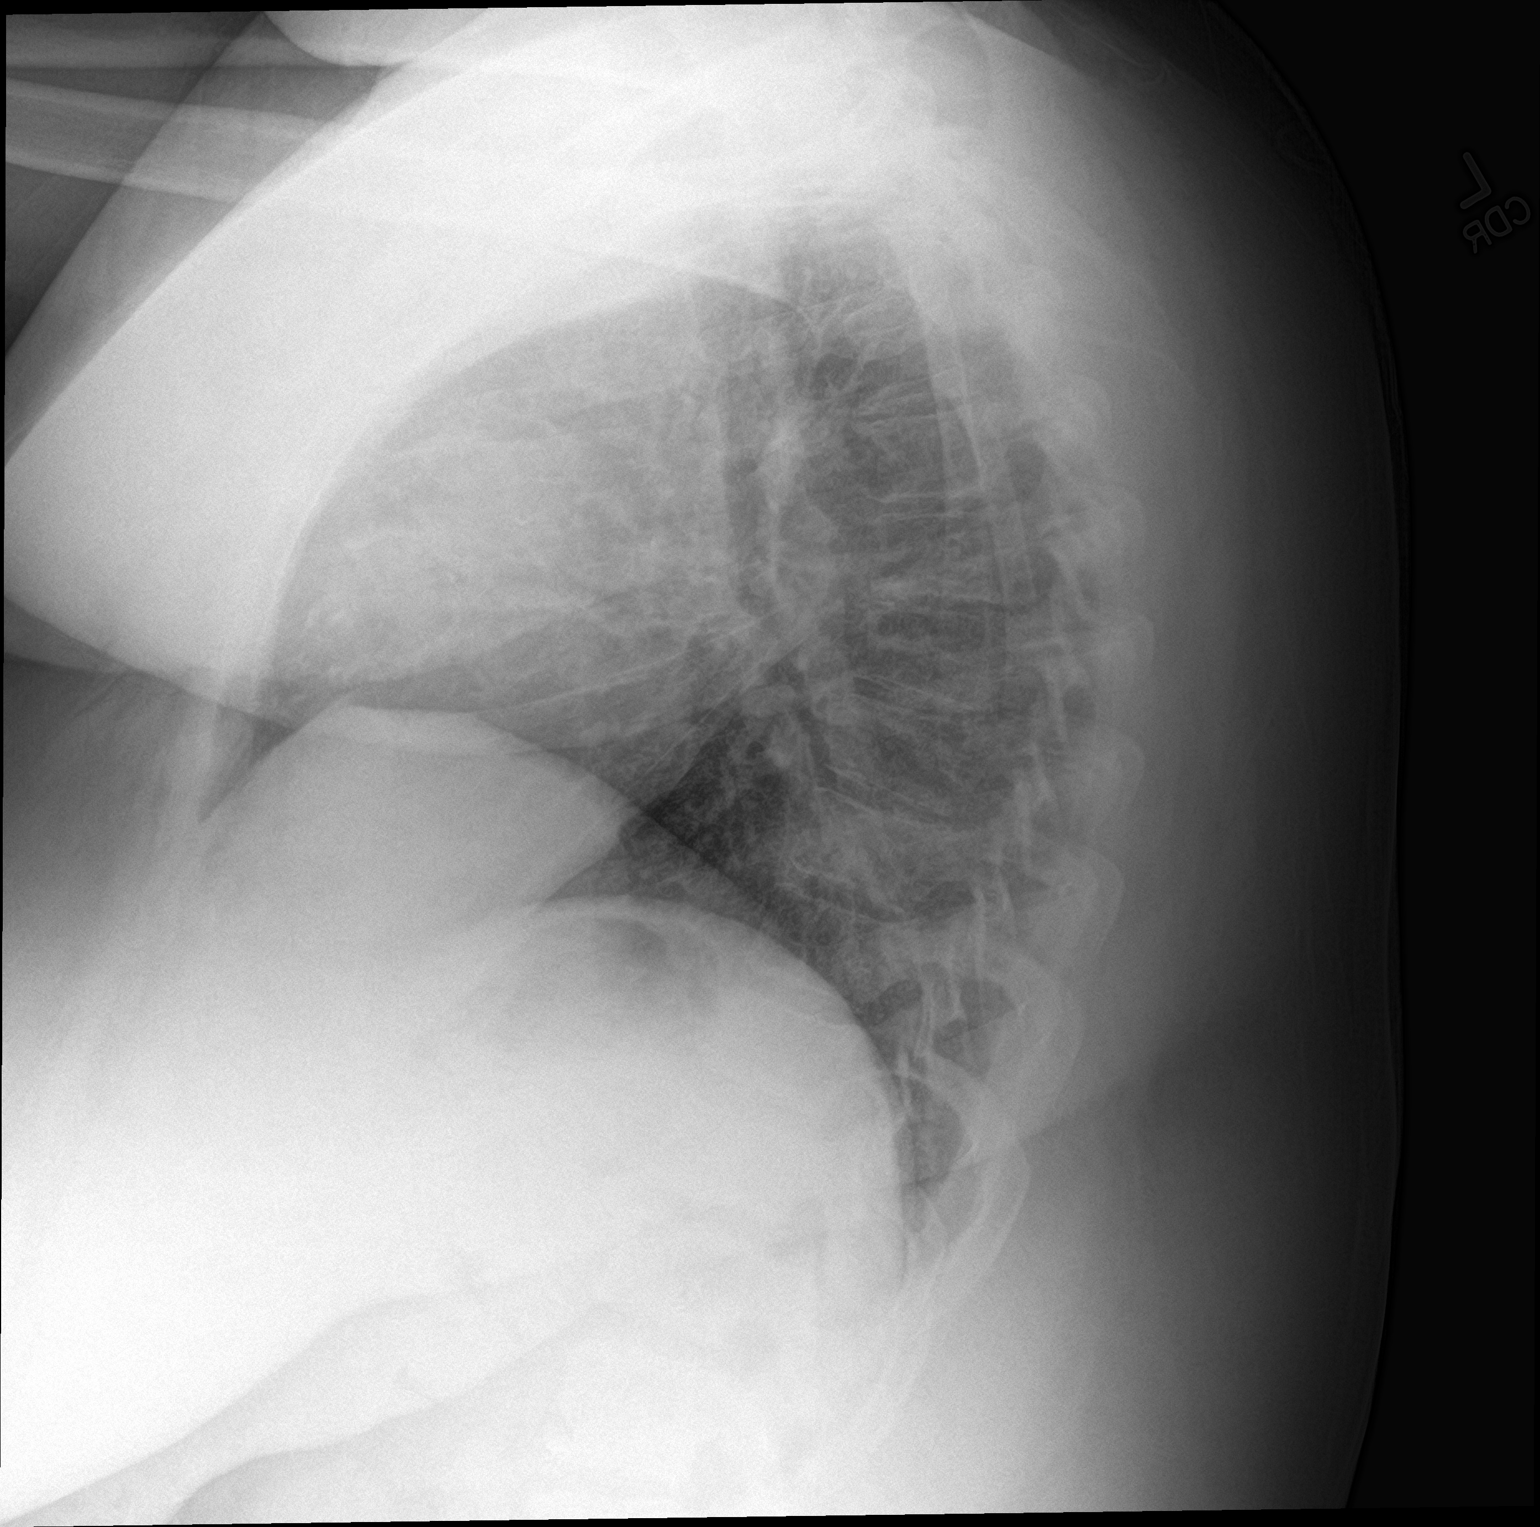

[2 of 2 positions shown; findings below may reference images not displayed]

FINDINGS: The heart size and mediastinal contours are within normal limits.
Both lungs are clear. The visualized skeletal structures are
unremarkable.
IMPRESSION: No active cardiopulmonary disease.

## 2019-04-14 ENCOUNTER — Other Ambulatory Visit: Payer: Self-pay | Admitting: Neurosurgery

## 2019-04-14 DIAGNOSIS — M542 Cervicalgia: Secondary | ICD-10-CM

## 2019-04-22 ENCOUNTER — Encounter: Payer: Self-pay | Admitting: Family

## 2019-05-11 ENCOUNTER — Other Ambulatory Visit: Payer: Self-pay

## 2019-05-14 ENCOUNTER — Ambulatory Visit
Admission: RE | Admit: 2019-05-14 | Discharge: 2019-05-14 | Disposition: A | Discharge status: Home / Self Care | Payer: Medicaid Other | Source: Ambulatory Visit | Attending: Neurosurgery | Admitting: Neurosurgery

## 2019-05-14 ENCOUNTER — Other Ambulatory Visit: Payer: Self-pay

## 2019-05-14 ENCOUNTER — Other Ambulatory Visit: Payer: Self-pay | Admitting: Neurosurgery

## 2019-05-14 DIAGNOSIS — M542 Cervicalgia: Secondary | ICD-10-CM

## 2019-05-24 ENCOUNTER — Other Ambulatory Visit: Payer: Self-pay

## 2019-05-25 ENCOUNTER — Ambulatory Visit (HOSPITAL_BASED_OUTPATIENT_CLINIC_OR_DEPARTMENT_OTHER)
Admission: RE | Admit: 2019-05-25 | Discharge: 2019-05-25 | Disposition: A | Discharge status: Home / Self Care | Payer: No Typology Code available for payment source | Source: Ambulatory Visit | Attending: Family | Admitting: Family

## 2019-05-25 ENCOUNTER — Ambulatory Visit (INDEPENDENT_AMBULATORY_CARE_PROVIDER_SITE_OTHER): Payer: No Typology Code available for payment source | Admitting: Family

## 2019-05-25 ENCOUNTER — Encounter: Payer: Self-pay | Admitting: Family

## 2019-05-25 VITALS — BP 159/96 | HR 108 | Temp 98.7°F | Resp 16 | Ht 65.0 in | Wt 263.0 lb

## 2019-05-25 DIAGNOSIS — R339 Retention of urine, unspecified: Secondary | ICD-10-CM

## 2019-05-25 DIAGNOSIS — G8929 Other chronic pain: Secondary | ICD-10-CM

## 2019-05-25 DIAGNOSIS — I10 Essential (primary) hypertension: Secondary | ICD-10-CM | POA: Diagnosis not present

## 2019-05-25 DIAGNOSIS — R109 Unspecified abdominal pain: Secondary | ICD-10-CM | POA: Insufficient documentation

## 2019-05-25 DIAGNOSIS — R590 Localized enlarged lymph nodes: Secondary | ICD-10-CM | POA: Diagnosis not present

## 2019-05-25 DIAGNOSIS — E1165 Type 2 diabetes mellitus with hyperglycemia: Secondary | ICD-10-CM

## 2019-05-25 DIAGNOSIS — M549 Dorsalgia, unspecified: Secondary | ICD-10-CM

## 2019-05-25 DIAGNOSIS — R3129 Other microscopic hematuria: Secondary | ICD-10-CM

## 2019-05-25 LAB — POC URINALSYSI DIPSTICK (AUTOMATED)
Bilirubin, UA: NEGATIVE
Glucose, UA: POSITIVE — AB
Ketones, UA: NEGATIVE
Leukocytes, UA: NEGATIVE
Nitrite, UA: NEGATIVE
Protein, UA: POSITIVE — AB
Spec Grav, UA: 1.02 (ref 1.010–1.025)
Urobilinogen, UA: NEGATIVE E.U./dL — AB
pH, UA: 5.5 (ref 5.0–8.0)

## 2019-05-25 MED ORDER — OXYCODONE-ACETAMINOPHEN 5-325 MG PO TABS: 1.0000 | ORAL_TABLET | Freq: Four times a day (QID) | ORAL | 0 refills | Status: DC | PRN

## 2019-05-25 MED ORDER — AMLODIPINE BESYLATE 10 MG PO TABS: 10.0000 mg | ORAL_TABLET | Freq: Every day | ORAL | 1 refills | Status: DC

## 2019-05-25 MED ORDER — ATORVASTATIN CALCIUM 40 MG PO TABS: 40.0000 mg | ORAL_TABLET | Freq: Every day | ORAL | 1 refills | Status: DC

## 2019-05-25 MED ORDER — LISINOPRIL 20 MG PO TABS: 20.0000 mg | ORAL_TABLET | Freq: Every day | ORAL | 1 refills | Status: DC

## 2019-05-25 NOTE — Patient Instructions (Signed)
Please complete lab work prior to leaving. Complete x-ray on the first floor.  

## 2019-05-25 NOTE — Progress Notes (Signed)
Subjective:    Patient ID: Catherine Espinoza, female    DOB: 17-May-1976, 43 y.o.   MRN: [id number]  HPI  Patient is a 43 yr old female who presents today with c/o back pain and incomplete bladder emptying.   She is following with pain management for her chronic back pain.  She is maintained on percocet.    Wt Readings from Last 3 Encounters:  05/25/19 263 lb (119.3 kg)  12/15/18 260 lb (117.9 kg)  11/23/18 257 lb (116.6 kg)   DM2- can't afford endocrinology.  She reports that she is taking 54 units of lantus each morning.  She also has [place].    Lab Results  Component Value Date   HGBA1C 14.9 (H) 06/23/2018   HGBA1C 14.0 01/05/2018   HGBA1C 13.7 (H) 10/24/2017   Lab Results  Component Value Date   MICROALBUR 6.6 (H) 06/21/2016   LDLCALC 134 (H) 10/24/2017   CREATININE 0.64 06/23/2018   Reports that she has not been taking her amlodipine because she ran out. BP Readings from Last 3 Encounters:  05/25/19 (!) 159/96  12/15/18 (!) 180/104  11/23/18 (!) 142/95     Review of Systems    see HPI  Past Medical History:  Diagnosis Date  . Anemia, unspecified   . Excessive or frequent menstruation   . Morbid obesity ([place])   . Obstructive sleep apnea 02/11/11   Sleep study: severe OSA- rec CPAP 20cm small  full face mask  . Proteinuria   . Type II or unspecified type diabetes mellitus without mention of complication, not stated as uncontrolled   . Unspecified essential hypertension      Social History   Socioeconomic History  . Marital status: Single    Spouse name: Not on file  . Number of children: Not on file  . Years of education: Not on file  . Highest education level: Not on file  Occupational History  . Occupation: cna/med Engineer, production: [employer]  Social Needs  . Financial resource strain: Not on file  . Food insecurity    Worry: Not on file    Inability: Not on file  . Transportation needs    Medical: Not on file    Non-medical: Not on file   Tobacco Use  . Smoking status: Current Every Day Smoker    Packs/day: 0.50    Years: 10.00    Pack years: 5.00    Types: Cigarettes  . Smokeless tobacco: Never Used  Substance and Sexual Activity  . Alcohol use: Yes    Alcohol/week: 0.0 standard drinks    Comment: occasional use  . Drug use: No  . Sexual activity: Yes    Birth control/protection: Surgical  Lifestyle  . Physical activity    Days per week: Not on file    Minutes per session: Not on file  . Stress: Not on file  Relationships  . Social Herbalist on phone: Not on file    Gets together: Not on file    Attends religious service: Not on file    Active member of club or organization: Not on file    Attends meetings of clubs or organizations: Not on file    Relationship status: Not on file  . Intimate partner violence    Fear of current or ex partner: Not on file    Emotionally abused: Not on file    Physically abused: Not on file    Forced sexual activity:  Not on file  Other Topics Concern  . Not on file  Social History Narrative   Gerome Sam for divorced   Daughter born 2003   Works as cna/med tech    Past Surgical History:  Procedure Laterality Date  . ABDOMINAL HYSTERECTOMY    . ABLATION COLPOCLESIS    . CESAREAN SECTION    . CLEFT PALATE REPAIR    . cyst removal    . ECTOPIC PREGNANCY SURGERY     x 2  . OOPHORECTOMY Right 2002    Family History  Problem Relation Age of Onset  . Heart attack Maternal Aunt   . Diabetes Mother   . Cancer Father        oral cancer    Allergies  Allergen Reactions  . Ibuprofen     REACTION: knots in mouth with SOB  . Labetalol Nausea Only  . Aspirin     Knots in mouth     Current Outpatient Medications on File Prior to Visit  Medication Sig Dispense Refill  . Adalimumab (HUMIRA) 40 MG/0.8ML PSKT Inject 0.8 mLs (40 mg total) into the skin as directed. Take 160mg  day 1 (4 inj), 80mg  day 15 (2 inj), 40 mg day 29 ( 1 inj). 7 each 0  .  Adalimumab (HUMIRA) 40 MG/0.8ML PSKT Inject 0.8 mLs (40 mg total) into the skin once a week. after completing loading dosages as maintenance. 2 each 4  . amLODipine (NORVASC) 10 MG tablet TAKE 1 TABLET (10 MG TOTAL) BY MOUTH DAILY. 30 tablet 2  . atorvastatin (LIPITOR) 40 MG tablet TAKE 1 TABLET (40 MG TOTAL) BY MOUTH DAILY. 30 tablet 5  . BAYER MICROLET LANCETS lancets Use as instructed to check blood sugar three times daily.  DX  E11.65 100 each 5  . beclomethasone (QVAR) 80 MCG/ACT inhaler 4 puffs twice daily for next 3 days, then 2 puffs twice daily 1 Inhaler 0  . clindamycin (CLEOCIN) 300 MG capsule Take 1 capsule (300 mg total) by mouth 2 (two) times daily. 14 capsule 0  . FLUoxetine (PROZAC) 10 MG tablet 1/2 tab by mouth once daily for 1 week, then increase to a full tablet once daily on week two 30 tablet 0  . gabapentin (NEURONTIN) 100 MG capsule Take 1 capsule (100 mg total) by mouth 3 (three) times daily. 90 capsule 3  . glucose blood (BAYER CONTOUR TEST) test strip Use to check blood sugar 3 times per day.  DX  E11.65 100 each 5  . HYDROcodone-acetaminophen (NORCO) 5-325 MG tablet Take 1 tablet by mouth every 6 (six) hours as needed. 20 tablet 0  . Insulin Glargine (LANTUS SOLOSTAR) 100 UNIT/ML Solostar Pen Inject 55 Units into the skin every morning. And pen needles 1/day 30 mL 5  . lisinopril (PRINIVIL,ZESTRIL) 20 MG tablet TAKE 1 TABLET (20 MG TOTAL) BY MOUTH DAILY. 90 tablet 1  . methocarbamol (ROBAXIN) 500 MG tablet Take 1 tablet (500 mg total) by mouth every 8 (eight) hours as needed for muscle spasms. 30 tablet 0  . naproxen (NAPROSYN) 500 MG tablet Take 1 tablet (500 mg total) by mouth 2 (two) times daily as needed. 30 tablet 0  . pantoprazole (PROTONIX) 40 MG tablet Take 1 tablet (40 mg total) by mouth daily. 30 tablet 3  . sitaGLIPtin (JANUVIA) 100 MG tablet Take 1 tablet (100 mg total) by mouth daily. 30 tablet 5  . valACYclovir (VALTREX) 1000 MG tablet Take 1 tablet by mouth  once daily for 5 days  at start of rash 15 tablet 1   No current facility-administered medications on file prior to visit.     BP (!) 159/96 (BP Location: Right Arm, Patient Position: Sitting, Cuff Size: Large)   Pulse (!) 108   Temp 98.7 F (37.1 C) (Oral)   Resp 16   Ht 5\' 5"  (1.651 m)   Wt 263 lb (119.3 kg)   LMP 09/13/2011   SpO2 98%   BMI 43.77 kg/m    Objective:   Physical Exam Constitutional:      Appearance: She is well-developed.  Neck:     Musculoskeletal: Neck supple.     Thyroid: No thyromegaly.  Cardiovascular:     Rate and Rhythm: Normal rate and regular rhythm.     Heart sounds: Normal heart sounds. No murmur.  Pulmonary:     Effort: Pulmonary effort is normal. No respiratory distress.     Breath sounds: Normal breath sounds. No wheezing.  Skin:    General: Skin is warm and dry.  Neurological:     Mental Status: She is alert and oriented to person, place, and time.  Psychiatric:        Behavior: Behavior normal.        Thought Content: Thought content normal.        Judgment: Judgment normal.           Assessment & Plan:  HTN- uncontrolled. Restart all meds.   DM2- very uncontrolled.  Will obtain follow up A1C, further recs pending review of A1C.  Chronic back pain- following with pain management and maintained on percocet.   Urinary problem-suspect sensation of incomplete bladder emptying is likely secondary to hyperglycemia. Dip does not suggest UTI. Will send for culture and plan to treat if +. She does have microscopic hematuria and I would ike to repeat her urine in a few weeks to ensure resolution. A KUB is performed and is negative for obvious kidney stone. She is a smoker which is concerning due to increased risk for bladder cancer. If persistent microscopic hematuria and/or bladder symptoms will need referral to urology for further evaluation.   Mediastinal adenopathy- states that her pain management doctor suggested follow up with PCP  regarding some LAD noted on CTA chest 05/17/18 at [facility]. These results are reviewed in care everywhere.  Will plan to repeat CT chest for further evaluation/follow up.  Cost may be an issue for her if her insurance does not cover.  It sounds like she has a catastrophic insurance plan with high deductible.

## 2019-05-26 ENCOUNTER — Telehealth: Payer: Self-pay | Admitting: Family

## 2019-05-26 ENCOUNTER — Ambulatory Visit: Payer: Medicaid Other | Admitting: Family

## 2019-05-26 DIAGNOSIS — R3129 Other microscopic hematuria: Secondary | ICD-10-CM

## 2019-05-26 LAB — CBC WITH DIFFERENTIAL/PLATELET
Basophils Absolute: 0.1 10*3/uL (ref 0.0–0.1)
Basophils Relative: 1.3 % (ref 0.0–3.0)
Eosinophils Absolute: 0.2 10*3/uL (ref 0.0–0.7)
Eosinophils Relative: 2 % (ref 0.0–5.0)
HCT: 45.5 % (ref 36.0–46.0)
Hemoglobin: 14.9 g/dL (ref 12.0–15.0)
Lymphocytes Relative: 40.4 % (ref 12.0–46.0)
Lymphs Abs: 3.2 10*3/uL (ref 0.7–4.0)
MCHC: 32.8 g/dL (ref 30.0–36.0)
MCV: 88.9 fl (ref 78.0–100.0)
Monocytes Absolute: 0.6 10*3/uL (ref 0.1–1.0)
Monocytes Relative: 7.1 % (ref 3.0–12.0)
Neutro Abs: 3.9 10*3/uL (ref 1.4–7.7)
Neutrophils Relative %: 49.2 % (ref 43.0–77.0)
Platelets: 226 10*3/uL (ref 150.0–400.0)
RBC: 5.11 Mil/uL (ref 3.87–5.11)
RDW: 14.8 % (ref 11.5–15.5)
WBC: 7.9 10*3/uL (ref 4.0–10.5)

## 2019-05-26 LAB — URINALYSIS, MICROSCOPIC ONLY

## 2019-05-26 LAB — BASIC METABOLIC PANEL
BUN: 8 mg/dL (ref 6–23)
CO2: 29 mEq/L (ref 19–32)
Calcium: 8.9 mg/dL (ref 8.4–10.5)
Chloride: 100 mEq/L (ref 96–112)
Creatinine, Ser: 0.55 mg/dL (ref 0.40–1.20)
GFR: 145.74 mL/min (ref >60.00)
Glucose, Bld: 338 mg/dL — ABNORMAL HIGH (ref 70–99)
Potassium: 3.7 mEq/L (ref 3.5–5.1)
Sodium: 135 mEq/L (ref 135–145)

## 2019-05-26 LAB — HEMOGLOBIN A1C: Hgb A1c MFr Bld: 14.6 % — ABNORMAL HIGH (ref 4.6–6.5)

## 2019-05-26 NOTE — Telephone Encounter (Signed)
Please contact pt and let her know that her x-ray does not show obvious kidney stone.  I would like for her to repeat her urinalysis in 2 weeks to see if the blood has cleared up. Order has been placed.

## 2019-05-27 LAB — URINE CULTURE
MICRO NUMBER:: 808761
SPECIMEN QUALITY:: ADEQUATE

## 2019-05-28 ENCOUNTER — Other Ambulatory Visit: Payer: Self-pay

## 2019-05-28 ENCOUNTER — Telehealth: Payer: Self-pay | Admitting: Family

## 2019-05-28 ENCOUNTER — Ambulatory Visit (HOSPITAL_BASED_OUTPATIENT_CLINIC_OR_DEPARTMENT_OTHER)
Admission: RE | Admit: 2019-05-28 | Discharge: 2019-05-28 | Disposition: A | Discharge status: Home / Self Care | Payer: No Typology Code available for payment source | Source: Ambulatory Visit | Attending: Family | Admitting: Family

## 2019-05-28 ENCOUNTER — Other Ambulatory Visit: Payer: Self-pay | Admitting: Family

## 2019-05-28 DIAGNOSIS — R590 Localized enlarged lymph nodes: Secondary | ICD-10-CM | POA: Diagnosis present

## 2019-05-28 MED ORDER — NITROFURANTOIN MONOHYD MACRO 100 MG PO CAPS: 100.0000 mg | ORAL_CAPSULE | Freq: Two times a day (BID) | ORAL | 0 refills | Status: DC

## 2019-05-28 MED ORDER — LANTUS SOLOSTAR 100 UNIT/ML ~~LOC~~ SOPN: 65.0000 [IU] | PEN_INJECTOR | Freq: Every day | SUBCUTANEOUS | 5 refills | Status: DC

## 2019-05-28 NOTE — Telephone Encounter (Signed)
All results and instructions from [facility] given to patient she verbalized understanding and will like the prescriptions to be sent to [facility] because she will pick up later tonight.  Both sent to [facility] and cancelled at [place].

## 2019-05-28 NOTE — Telephone Encounter (Signed)
Please contact patient and let her know that her urine is showing urinary tract infection.  I have sent a prescription to her pharmacy for [place] an antibiotic to begin for UTI.  In addition please let her know that her blood sugar is extremely uncontrolled.  I would like her to make sure that she is taking all of her medications and to increase her Lantus from 55 units to 65 units once daily.  Check fasting blood sugar once daily for 1 week and then call me with readings or send via my chart.

## 2019-05-28 NOTE — Telephone Encounter (Signed)
Medication: Insulin Glargine (LANTUS SOLOSTAR) 100 UNIT/ML Solostar Pen [id number] , nitrofurantoin, macrocrystal-monohydrate, (MACROBID) 100 MG capsule [id number] medication was sent to the wrong pharmacy. Can it be sent to the pharmacy below Has the patient contacted their pharmacy? Yes  (Agent: If no, request that the patient contact the pharmacy for the refill.) (Agent: If yes, when and what did the pharmacy advise?)  Preferred Pharmacy (with phone number or street name): [place] [phone] (Phone) [phone] (Fax)    Agent: Please be advised that RX refills may take up to 3 business days. We ask that you follow-up with your pharmacy.

## 2019-05-31 ENCOUNTER — Telehealth: Payer: Self-pay | Admitting: Family

## 2019-05-31 DIAGNOSIS — R9389 Abnormal findings on diagnostic imaging of other specified body structures: Secondary | ICD-10-CM

## 2019-05-31 NOTE — Telephone Encounter (Signed)
Also, has she been using her cpap regularly? If not I recommend she wear nightly.

## 2019-05-31 NOTE — Telephone Encounter (Signed)
Advised patient of ct findings and provider's recommendations. She verbalized understanding. Will be here 06-14-2019 for urine analysis

## 2019-05-31 NOTE — Telephone Encounter (Signed)
Please contact pt and let her know that I reviewed her CT scan and it shows some chronic changes in her lungs circulation and I would really like for her to meet with pulmonology for a consult.  Referral has been pended.

## 2019-05-31 NOTE — Telephone Encounter (Signed)
Advised patient of x-ray findings and scheduled for labs 06-14-2019

## 2019-06-11 ENCOUNTER — Encounter: Payer: Self-pay | Admitting: Family

## 2019-06-14 ENCOUNTER — Other Ambulatory Visit: Payer: No Typology Code available for payment source

## 2019-06-16 ENCOUNTER — Encounter: Payer: Self-pay | Admitting: Family

## 2019-06-29 ENCOUNTER — Ambulatory Visit (INDEPENDENT_AMBULATORY_CARE_PROVIDER_SITE_OTHER): Payer: No Typology Code available for payment source | Admitting: Family

## 2019-06-29 ENCOUNTER — Encounter: Payer: Self-pay | Admitting: Family

## 2019-06-29 ENCOUNTER — Other Ambulatory Visit: Payer: Self-pay

## 2019-06-29 VITALS — BP 146/93 | HR 108 | Temp 96.7°F | Resp 16 | Wt 268.0 lb

## 2019-06-29 DIAGNOSIS — G8929 Other chronic pain: Secondary | ICD-10-CM | POA: Diagnosis not present

## 2019-06-29 DIAGNOSIS — Z23 Encounter for immunization: Secondary | ICD-10-CM | POA: Diagnosis not present

## 2019-06-29 DIAGNOSIS — E1165 Type 2 diabetes mellitus with hyperglycemia: Secondary | ICD-10-CM

## 2019-06-29 DIAGNOSIS — G894 Chronic pain syndrome: Secondary | ICD-10-CM

## 2019-06-29 DIAGNOSIS — E785 Hyperlipidemia, unspecified: Secondary | ICD-10-CM | POA: Diagnosis not present

## 2019-06-29 DIAGNOSIS — M546 Pain in thoracic spine: Secondary | ICD-10-CM

## 2019-06-29 MED ORDER — TRULICITY 0.75 MG/0.5ML ~~LOC~~ SOAJ: 0.7500 mg | SUBCUTANEOUS | 5 refills | Status: DC

## 2019-06-29 MED ORDER — PREGABALIN 150 MG PO CAPS: 150.0000 mg | ORAL_CAPSULE | Freq: Two times a day (BID) | ORAL | Status: DC

## 2019-06-29 NOTE — Progress Notes (Signed)
Subjective:    Patient ID: Catherine Espinoza, female    DOB: December 13, 1975, 43 y.o.   MRN: [id number]  HPI  Patient is a 43 yr old female who presents today with chief complaint of back pain. Reports that pain is 10/10.  She has known thoracic disc disease (seen on MRI 3/20).  She was seeing Leonie Green NP at [facility] for pain management but reports that they sent her a certified letter that she owes too much money and they cannot see her until she pays them.  Unfortunately, she cannot afford to pay the balance. She will run out of her pain medication in about 10 days.  Reports that when she takes the percocet her pain "goes away." However, after about 6 hours pain returns. Reports that her current pain level is 10/10 since she is due for another dose.  Pain is worse early in the AM and late at night.  Pain is located in the mid thoracic spine and radiates around beneath the breasts. L>R.   DM2- reports that recently her sugars have been in the high 200's despite using 65 units of lantus. She is requesting to restart trulicity.    Lab Results  Component Value Date   HGBA1C 14.6 (H) 05/25/2019   HGBA1C 14.9 (H) 06/23/2018   HGBA1C 14.0 01/05/2018   Lab Results  Component Value Date   MICROALBUR 6.6 (H) 06/21/2016   LDLCALC 134 (H) 10/24/2017   CREATININE 0.55 05/25/2019      HTN- BP meds include amlodipine 10mg . BP Readings from Last 3 Encounters:  06/29/19 (!) 146/93  05/25/19 (!) 159/96  12/15/18 (!) 180/104     Review of Systems   See HPI  Past Medical History:  Diagnosis Date  . Anemia, unspecified   . Excessive or frequent menstruation   . Morbid obesity ([place])   . Obstructive sleep apnea 02/11/11   Sleep study: severe OSA- rec CPAP 20cm small  full face mask  . Proteinuria   . Type II or unspecified type diabetes mellitus without mention of complication, not stated as uncontrolled   . Unspecified essential hypertension      Social History    Socioeconomic History  . Marital status: Single    Spouse name: Not on file  . Number of children: Not on file  . Years of education: Not on file  . Highest education level: Not on file  Occupational History  . Occupation: cna/med Engineer, production: [employer]  Social Needs  . Financial resource strain: Not on file  . Food insecurity    Worry: Not on file    Inability: Not on file  . Transportation needs    Medical: Not on file    Non-medical: Not on file  Tobacco Use  . Smoking status: Current Every Day Smoker    Packs/day: 0.50    Years: 10.00    Pack years: 5.00    Types: Cigarettes  . Smokeless tobacco: Never Used  Substance and Sexual Activity  . Alcohol use: Yes    Alcohol/week: 0.0 standard drinks    Comment: occasional use  . Drug use: No  . Sexual activity: Yes    Birth control/protection: Surgical  Lifestyle  . Physical activity    Days per week: Not on file    Minutes per session: Not on file  . Stress: Not on file  Relationships  . Social connections    Talks on phone: Not on file  Gets together: Not on file    Attends religious service: Not on file    Active member of club or organization: Not on file    Attends meetings of clubs or organizations: Not on file    Relationship status: Not on file  . Intimate partner violence    Fear of current or ex partner: Not on file    Emotionally abused: Not on file    Physically abused: Not on file    Forced sexual activity: Not on file  Other Topics Concern  . Not on file  Social History Narrative   Gerome Sam for divorced   Daughter born 2003   Works as cna/med tech    Past Surgical History:  Procedure Laterality Date  . ABDOMINAL HYSTERECTOMY    . ABLATION COLPOCLESIS    . CESAREAN SECTION    . CLEFT PALATE REPAIR    . cyst removal    . ECTOPIC PREGNANCY SURGERY     x 2  . OOPHORECTOMY Right 2002    Family History  Problem Relation Age of Onset  . Heart attack Maternal Aunt   .  Diabetes Mother   . Cancer Father        oral cancer    Allergies  Allergen Reactions  . Ibuprofen     REACTION: knots in mouth with SOB  . Labetalol Nausea Only  . Aspirin     Knots in mouth     Current Outpatient Medications on File Prior to Visit  Medication Sig Dispense Refill  . amLODipine (NORVASC) 10 MG tablet Take 1 tablet (10 mg total) by mouth daily. 90 tablet 1  . atorvastatin (LIPITOR) 40 MG tablet Take 1 tablet (40 mg total) by mouth daily. 90 tablet 1  . BAYER MICROLET LANCETS lancets Use as instructed to check blood sugar three times daily.  DX  E11.65 100 each 5  . FLUoxetine (PROZAC) 10 MG tablet 1/2 tab by mouth once daily for 1 week, then increase to a full tablet once daily on week two 30 tablet 0  . glucose blood (BAYER CONTOUR TEST) test strip Use to check blood sugar 3 times per day.  DX  E11.65 100 each 5  . Insulin Glargine (LANTUS SOLOSTAR) 100 UNIT/ML Solostar Pen Inject 65 Units into the skin daily. And pen needles 1/day 30 mL 5  . lisinopril (ZESTRIL) 20 MG tablet Take 1 tablet (20 mg total) by mouth daily. 90 tablet 1  . oxyCODONE-acetaminophen (PERCOCET) 5-325 MG tablet Take 1 tablet by mouth every 6 (six) hours as needed for severe pain. 30 tablet 0  . sitaGLIPtin (JANUVIA) 100 MG tablet Take 1 tablet (100 mg total) by mouth daily. 30 tablet 5  . valACYclovir (VALTREX) 1000 MG tablet Take 1 tablet by mouth once daily for 5 days at start of rash 15 tablet 1   No current facility-administered medications on file prior to visit.     BP (!) 146/93 (BP Location: Right Arm, Patient Position: Sitting, Cuff Size: Large)   Pulse (!) 108   Temp (!) 96.7 F (35.9 C) (Temporal)   Resp 16   Wt 268 lb (121.6 kg)   LMP 09/13/2011   SpO2 96%   BMI 44.60 kg/m       Objective:   Physical Exam Constitutional:      Appearance: She is well-developed.     Comments: Appears uncomfortable.   Neck:     Musculoskeletal: Neck supple.     Thyroid: No  thyromegaly.  Cardiovascular:     Rate and Rhythm: Normal rate and regular rhythm.     Heart sounds: Normal heart sounds. No murmur.  Pulmonary:     Effort: Pulmonary effort is normal. No respiratory distress.     Breath sounds: Normal breath sounds. No wheezing.  Skin:    General: Skin is warm and dry.  Neurological:     Mental Status: She is alert and oriented to person, place, and time.  Psychiatric:        Behavior: Behavior normal.        Thought Content: Thought content normal.        Judgment: Judgment normal.           Assessment & Plan:  Chronic back pain- will refer her to the [facility] pain clinic for ongoing management. She states that they are on her plan. I saw that she is on Ly[place] and gabapentin. Advised pt to d/c gabapentin. I also suggested PT. She will check coverage with her insurance and let me know if she would like to proceed with referral.  DM2- uncontrolled. Will restart trulicity. Pt is advised to decrease her lantus from 65 units to 55 units to avoid hypoglycemia. Advised her watch her sugars closely and send me her updated readings in 1 week.   Hyperlipidemia- obtain follow up lipid panel

## 2019-06-29 NOTE — Patient Instructions (Addendum)
Start Trulicity, decrease insulin from 65 units to 55 units once daily when you start Trulicity. Stop gabapentin since you are on lyrica.

## 2019-06-30 MED FILL — TRULICITY 0.75 MG/0.5 ML PE: 0.75 | 28 days supply | Qty: 2 | Fill #0

## 2019-07-02 ENCOUNTER — Encounter: Payer: Self-pay | Admitting: Family

## 2019-07-05 ENCOUNTER — Telehealth: Payer: Self-pay | Admitting: Family

## 2019-07-05 NOTE — Telephone Encounter (Signed)
Please contact pt to schedule lab visit.

## 2019-07-06 ENCOUNTER — Encounter: Payer: Self-pay | Admitting: Family

## 2019-07-06 ENCOUNTER — Other Ambulatory Visit: Payer: Self-pay

## 2019-07-06 ENCOUNTER — Other Ambulatory Visit: Payer: Self-pay | Admitting: Family

## 2019-07-06 ENCOUNTER — Ambulatory Visit (INDEPENDENT_AMBULATORY_CARE_PROVIDER_SITE_OTHER): Payer: No Typology Code available for payment source | Admitting: Family

## 2019-07-06 VITALS — BP 158/94 | HR 104 | Temp 96.8°F | Resp 16 | Wt 266.0 lb

## 2019-07-06 DIAGNOSIS — I159 Secondary hypertension, unspecified: Secondary | ICD-10-CM | POA: Diagnosis not present

## 2019-07-06 DIAGNOSIS — M546 Pain in thoracic spine: Secondary | ICD-10-CM

## 2019-07-06 DIAGNOSIS — G8929 Other chronic pain: Secondary | ICD-10-CM | POA: Diagnosis not present

## 2019-07-06 DIAGNOSIS — E1165 Type 2 diabetes mellitus with hyperglycemia: Secondary | ICD-10-CM

## 2019-07-06 MED ORDER — DULOXETINE HCL 30 MG PO CPEP: 60.0000 mg | ORAL_CAPSULE | Freq: Every day | ORAL | 3 refills | Status: DC

## 2019-07-06 MED ORDER — OXYCODONE-ACETAMINOPHEN 5-325 MG PO TABS: 1.0000 | ORAL_TABLET | Freq: Three times a day (TID) | ORAL | 0 refills | Status: DC | PRN

## 2019-07-06 NOTE — Progress Notes (Signed)
Subjective:    Patient ID: Catherine Espinoza, female    DOB: 1976-01-11, 43 y.o.   MRN: [id number]  HPI  Patient is a 43 year old female with chronic thoracic back pain who presents today to discuss pain management.  She has been followed by the pain clinic however her insurance coverage for these visits was very poor and she incurred a DAT at the clinic.  They have declined to see her in follow-up until she pays her DAT.  She is unable to pay the data at this time.  She contacted her insurance company and was told that the [facility] pain clinic was in network for her.  However when she went to the clinic they declined to take her insurance because of the fact that it was a limited plan.  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  Open enrollment begins in November.  Reports pain is between her shoulders.  Reports pain is current 7/10. Reports intermittent bursting sharp pains. Reports that it hurts for shower water to hit it.     BP Readings from Last 3 Encounters:  07/06/19 (!) 158/94  06/29/19 (!) 146/93  05/25/19 (!) 159/96   Reports that she started trulicity on Saturday. Sugar this AM was 123.  Trying not to eat starches.  Wt Readings from Last 3 Encounters:  07/06/19 266 lb (120.7 kg)  06/29/19 268 lb (121.6 kg)  05/25/19 263 lb (119.3 kg)    Review of Systems See HPI  Past Medical History:  Diagnosis Date  . Anemia, unspecified   . Excessive or frequent menstruation   . Morbid obesity ([place])   . Obstructive sleep apnea 02/11/11   Sleep study: severe OSA- rec CPAP 20cm small  full face mask  . Proteinuria   . Type II or unspecified type diabetes mellitus without mention of complication, not stated as uncontrolled   . Unspecified essential hypertension      Social History   Socioeconomic History  . Marital status: Single    Spouse name: Not on file  . Number of children: Not on file   . Years of education: Not on file  . Highest education level: Not on file  Occupational History  . Occupation: cna/med Engineer, production: [employer]  Social Needs  . Financial resource strain: Not on file  . Food insecurity    Worry: Not on file    Inability: Not on file  . Transportation needs    Medical: Not on file    Non-medical: Not on file  Tobacco Use  . Smoking status: Current Every Day Smoker    Packs/day: 0.50    Years: 10.00    Pack years: 5.00    Types: Cigarettes  . Smokeless tobacco: Never Used  Substance and Sexual Activity  . Alcohol use: Yes    Alcohol/week: 0.0 standard drinks    Comment: occasional use  . Drug use: No  . Sexual activity: Yes    Birth control/protection: Surgical  Lifestyle  . Physical activity    Days per week: Not on file    Minutes per session: Not on file  . Stress: Not on file  Relationships  . Social Herbalist on phone: Not on file    Gets together: Not on file    Attends religious service: Not on file    Active member of club or organization: Not  on file    Attends meetings of clubs or organizations: Not on file    Relationship status: Not on file  . Intimate partner violence    Fear of current or ex partner: Not on file    Emotionally abused: Not on file    Physically abused: Not on file    Forced sexual activity: Not on file  Other Topics Concern  . Not on file  Social History Narrative   Gerome Sam for divorced   Daughter born 2003   Works as cna/med tech    Past Surgical History:  Procedure Laterality Date  . ABDOMINAL HYSTERECTOMY    . ABLATION COLPOCLESIS    . CESAREAN SECTION    . CLEFT PALATE REPAIR    . cyst removal    . ECTOPIC PREGNANCY SURGERY     x 2  . OOPHORECTOMY Right 2002    Family History  Problem Relation Age of Onset  . Heart attack Maternal Aunt   . Diabetes Mother   . Cancer Father        oral cancer    Allergies  Allergen Reactions  . Ibuprofen      REACTION: knots in mouth with SOB  . Labetalol Nausea Only  . Aspirin     Knots in mouth     Current Outpatient Medications on File Prior to Visit  Medication Sig Dispense Refill  . amLODipine (NORVASC) 10 MG tablet Take 1 tablet (10 mg total) by mouth daily. 90 tablet 1  . atorvastatin (LIPITOR) 40 MG tablet Take 1 tablet (40 mg total) by mouth daily. 90 tablet 1  . BAYER MICROLET LANCETS lancets Use as instructed to check blood sugar three times daily.  DX  E11.65 100 each 5  . Dulaglutide (TRULICITY) A999333 0000000 SOPN Inject 0.75 mg into the skin once a week. 2 mL 5  . FLUoxetine (PROZAC) 10 MG tablet 1/2 tab by mouth once daily for 1 week, then increase to a full tablet once daily on week two 30 tablet 0  . glucose blood (BAYER CONTOUR TEST) test strip Use to check blood sugar 3 times per day.  DX  E11.65 100 each 5  . Insulin Glargine (LANTUS SOLOSTAR) 100 UNIT/ML Solostar Pen Inject 65 Units into the skin daily. And pen needles 1/day 30 mL 5  . lisinopril (ZESTRIL) 20 MG tablet Take 1 tablet (20 mg total) by mouth daily. 90 tablet 1  . oxyCODONE-acetaminophen (PERCOCET) 5-325 MG tablet Take 1 tablet by mouth every 6 (six) hours as needed for severe pain. 30 tablet 0  . sitaGLIPtin (JANUVIA) 100 MG tablet Take 1 tablet (100 mg total) by mouth daily. 30 tablet 5  . valACYclovir (VALTREX) 1000 MG tablet Take 1 tablet by mouth once daily for 5 days at start of rash 15 tablet 1   No current facility-administered medications on file prior to visit.     BP (!) 158/94 (BP Location: Right Arm, Patient Position: Sitting, Cuff Size: Large)   Pulse (!) 104   Temp (!) 96.8 F (36 C) (Temporal)   Resp 16   Wt 266 lb (120.7 kg)   LMP 09/13/2011   BMI 44.26 kg/m       Objective:   Physical Exam Constitutional:      Appearance: She is well-developed.  Cardiovascular:     Rate and Rhythm: Normal rate and regular rhythm.     Heart sounds: Normal heart sounds. No murmur.  Pulmonary:      Effort:  Pulmonary effort is normal. No respiratory distress.     Breath sounds: Normal breath sounds. No wheezing.  Psychiatric:        Behavior: Behavior normal.        Thought Content: Thought content normal.        Judgment: Judgment normal.           Assessment & Plan:  Chronic thoracic back pain- I advised the patient that I would take over her pain management temporarily until she can get on another health plan.  At that time we can plan to refer her to a pain clinic in her network.  I advised her that I am not comfortable mixing the Lyrica and the Percocet together.  I have advised her to discontinue Lyrica.  She is currently receiving 120 tablets of Percocet per month.  She is currently dosing every 6 hours.  I have advised her to try to cut back to every 8 hours.  To help with her pain she can add an additional 325 mg tablet of Tylenol with each 5-3 25 dose of Percocet.  The [place] controlled substance registry is reviewed.  A controlled substance contract will be signed today and a urine drug screen obtained.  Diabetes type 2-already noting improved control with initiation of Trulicity.  Continue same.  Hypertension-this remains uncontrolled.  I have advised her to increase her lisinopril from 20 mg to 30 mg once daily.

## 2019-07-06 NOTE — Patient Instructions (Addendum)
Try to limit your percocet to 3 tablets per 24 hours.  Add tylenol 325mg  one tablet with the percocet. Increase lisinopril from 20mg  to 30mg  (1.5 tabs once daily).

## 2019-07-08 LAB — PAIN MGMT, PROFILE 8 W/CONF, U
6 Acetylmorphine: NEGATIVE ng/mL
Alcohol Metabolites: NEGATIVE ng/mL (ref <500)
Amphetamines: NEGATIVE ng/mL
Benzodiazepines: NEGATIVE ng/mL
Buprenorphine, Urine: NEGATIVE ng/mL
Cocaine Metabolite: NEGATIVE ng/mL
Codeine: NEGATIVE ng/mL
Creatinine: 276.9 mg/dL
Hydrocodone: NEGATIVE ng/mL
Hydromorphone: NEGATIVE ng/mL
MDMA: NEGATIVE ng/mL
Marijuana Metabolite: NEGATIVE ng/mL
Morphine: NEGATIVE ng/mL
Norhydrocodone: NEGATIVE ng/mL
Noroxycodone: 4128 ng/mL
Opiates: NEGATIVE ng/mL
Oxidant: NEGATIVE ug/mL
Oxycodone: 2403 ng/mL
Oxycodone: POSITIVE ng/mL
Oxymorphone: 3666 ng/mL
pH: 6.1 (ref 4.5–9.0)

## 2019-07-19 ENCOUNTER — Other Ambulatory Visit: Payer: Self-pay

## 2019-07-20 ENCOUNTER — Ambulatory Visit (INDEPENDENT_AMBULATORY_CARE_PROVIDER_SITE_OTHER): Payer: No Typology Code available for payment source | Admitting: Family

## 2019-07-20 VITALS — BP 114/88 | HR 112 | Temp 96.5°F | Resp 16 | Wt 265.0 lb

## 2019-07-20 DIAGNOSIS — E1165 Type 2 diabetes mellitus with hyperglycemia: Secondary | ICD-10-CM

## 2019-07-20 DIAGNOSIS — G8929 Other chronic pain: Secondary | ICD-10-CM

## 2019-07-20 DIAGNOSIS — M546 Pain in thoracic spine: Secondary | ICD-10-CM | POA: Diagnosis not present

## 2019-07-20 DIAGNOSIS — I10 Essential (primary) hypertension: Secondary | ICD-10-CM

## 2019-07-20 NOTE — Progress Notes (Signed)
Subjective:     Patient ID: Catherine Espinoza, female   DOB: [birth year], 43 y.o.   MRN: [id number]  HPI  Patient is a 43 yr old female who presents today for follow up.  Back pain- reports ongoing thoracic back pain.  Reports percocet is not helping.  HTN- She is also taking 30mg  of lisinopril  BP Readings from Last 3 Encounters:  07/20/19 114/88  07/06/19 (!) 158/94  06/29/19 (!) 146/93   DM2- reports that her sugars are doing better on trulicity. Reports that her sugars 120-150.  Highest reading 210.  Lab Results  Component Value Date   HGBA1C 14.6 (H) 05/25/2019   HGBA1C 14.9 (H) 06/23/2018   HGBA1C 14.0 01/05/2018   Lab Results  Component Value Date   MICROALBUR 6.6 (H) 06/21/2016   LDLCALC 134 (H) 10/24/2017   CREATININE 0.55 05/25/2019     Review of Systems See HPI  Past Medical History:  Diagnosis Date  . Anemia, unspecified   . Excessive or frequent menstruation   . Morbid obesity ([place])   . Obstructive sleep apnea 02/11/11   Sleep study: severe OSA- rec CPAP 20cm small  full face mask  . Proteinuria   . Type II or unspecified type diabetes mellitus without mention of complication, not stated as uncontrolled   . Unspecified essential hypertension      Social History   Socioeconomic History  . Marital status: Single    Spouse name: Not on file  . Number of children: Not on file  . Years of education: Not on file  . Highest education level: Not on file  Occupational History  . Occupation: cna/med Engineer, production: [employer]  Social Needs  . Financial resource strain: Not on file  . Food insecurity    Worry: Not on file    Inability: Not on file  . Transportation needs    Medical: Not on file    Non-medical: Not on file  Tobacco Use  . Smoking status: Current Every Day Smoker    Packs/day: 0.50    Years: 10.00    Pack years: 5.00    Types: Cigarettes  . Smokeless tobacco: Never Used  Substance and Sexual Activity  . Alcohol use: Yes   Alcohol/week: 0.0 standard drinks    Comment: occasional use  . Drug use: No  . Sexual activity: Yes    Birth control/protection: Surgical  Lifestyle  . Physical activity    Days per week: Not on file    Minutes per session: Not on file  . Stress: Not on file  Relationships  . Social Herbalist on phone: Not on file    Gets together: Not on file    Attends religious service: Not on file    Active member of club or organization: Not on file    Attends meetings of clubs or organizations: Not on file    Relationship status: Not on file  . Intimate partner violence    Fear of current or ex partner: Not on file    Emotionally abused: Not on file    Physically abused: Not on file    Forced sexual activity: Not on file  Other Topics Concern  . Not on file  Social History Narrative   Gerome Sam for divorced   Daughter born 2003   Works as cna/med tech    Past Surgical History:  Procedure Laterality Date  . ABDOMINAL HYSTERECTOMY    . ABLATION COLPOCLESIS    .  CESAREAN SECTION    . CLEFT PALATE REPAIR    . cyst removal    . ECTOPIC PREGNANCY SURGERY     x 2  . OOPHORECTOMY Right 2002    Family History  Problem Relation Age of Onset  . Heart attack Maternal Aunt   . Diabetes Mother   . Cancer Father        oral cancer    Allergies  Allergen Reactions  . Ibuprofen     REACTION: knots in mouth with SOB  . Labetalol Nausea Only  . Aspirin     Knots in mouth     Current Outpatient Medications on File Prior to Visit  Medication Sig Dispense Refill  . amLODipine (NORVASC) 10 MG tablet Take 1 tablet (10 mg total) by mouth daily. 90 tablet 1  . atorvastatin (LIPITOR) 40 MG tablet Take 1 tablet (40 mg total) by mouth daily. 90 tablet 1  . BAYER MICROLET LANCETS lancets Use as instructed to check blood sugar three times daily.  DX  E11.65 100 each 5  . Dulaglutide (TRULICITY) A999333 0000000 SOPN Inject 0.75 mg into the skin once a week. 2 mL 5  . DULoxetine  (CYMBALTA) 30 MG capsule Take 2 capsules (60 mg total) by mouth daily.  3  . glucose blood (BAYER CONTOUR TEST) test strip Use to check blood sugar 3 times per day.  DX  E11.65 100 each 5  . Insulin Glargine (LANTUS SOLOSTAR) 100 UNIT/ML Solostar Pen Inject 65 Units into the skin daily. And pen needles 1/day 30 mL 5  . oxyCODONE-acetaminophen (PERCOCET) 5-325 MG tablet Take 1 tablet by mouth every 8 (eight) hours as needed for severe pain. 90 tablet 0  . sitaGLIPtin (JANUVIA) 100 MG tablet Take 1 tablet (100 mg total) by mouth daily. 30 tablet 5  . valACYclovir (VALTREX) 1000 MG tablet Take 1 tablet by mouth once daily for 5 days at start of rash 15 tablet 1  . [DISCONTINUED] lisinopril (ZESTRIL) 20 MG tablet Take 1 tablet (20 mg total) by mouth daily. 90 tablet 1   No current facility-administered medications on file prior to visit.     BP 114/88 (BP Location: Right Arm, Patient Position: Sitting, Cuff Size: Large)   Pulse (!) 112   Temp (!) 96.5 F (35.8 C) (Temporal)   Resp 16   Wt 265 lb (120.2 kg)   LMP 09/13/2011   SpO2 96%   BMI 44.10 kg/m       Objective:   Physical Exam Constitutional:      Appearance: She is well-developed.  Neck:     Musculoskeletal: Neck supple.     Thyroid: No thyromegaly.  Cardiovascular:     Rate and Rhythm: Normal rate and regular rhythm.     Heart sounds: Normal heart sounds. No murmur.  Pulmonary:     Effort: Pulmonary effort is normal. No respiratory distress.     Breath sounds: Normal breath sounds. No wheezing.  Skin:    General: Skin is warm and dry.  Neurological:     Mental Status: She is alert and oriented to person, place, and time.  Psychiatric:        Behavior: Behavior normal.        Thought Content: Thought content normal.        Judgment: Judgment normal.        Assessment:     Chronic back pain-  Advised pt that I am not comfortable increasing her narcotics.  Unfortunately, she  is allergic to NSAIDS so we can't add  that.  She plans to try to get a new insurance policy during her work open enrollment in November.  DM2- Reports improvement on trulicity.   HTN-  BP stable on current dose of lisinopril, continue same.      Plan:

## 2019-07-21 ENCOUNTER — Encounter: Payer: Self-pay | Admitting: Family

## 2019-07-23 ENCOUNTER — Other Ambulatory Visit: Payer: No Typology Code available for payment source

## 2019-07-27 ENCOUNTER — Encounter: Payer: Self-pay | Admitting: Family

## 2019-07-27 ENCOUNTER — Other Ambulatory Visit: Payer: Self-pay | Admitting: Family

## 2019-07-27 MED ORDER — OXYCODONE-ACETAMINOPHEN 5-325 MG PO TABS: 1.0000 | ORAL_TABLET | Freq: Three times a day (TID) | ORAL | 0 refills | Status: DC | PRN

## 2019-07-28 ENCOUNTER — Other Ambulatory Visit: Payer: No Typology Code available for payment source

## 2019-07-29 ENCOUNTER — Encounter: Payer: Self-pay | Admitting: Family

## 2019-07-29 MED FILL — TRULICITY 0.75 MG/0.5 ML PE: 0.75 | 28 days supply | Qty: 2 | Fill #1

## 2019-08-02 ENCOUNTER — Encounter (HOSPITAL_BASED_OUTPATIENT_CLINIC_OR_DEPARTMENT_OTHER): Payer: Self-pay | Admitting: *Deleted

## 2019-08-02 ENCOUNTER — Other Ambulatory Visit: Payer: Self-pay

## 2019-08-02 ENCOUNTER — Emergency Department (HOSPITAL_BASED_OUTPATIENT_CLINIC_OR_DEPARTMENT_OTHER)
Admission: EM | Admit: 2019-08-02 | Discharge: 2019-08-02 | Disposition: A | Discharge status: Home / Self Care | Payer: No Typology Code available for payment source | Attending: Emergency Medicine | Admitting: Emergency Medicine

## 2019-08-02 DIAGNOSIS — Z794 Long term (current) use of insulin: Secondary | ICD-10-CM | POA: Diagnosis not present

## 2019-08-02 DIAGNOSIS — E119 Type 2 diabetes mellitus without complications: Secondary | ICD-10-CM | POA: Diagnosis not present

## 2019-08-02 DIAGNOSIS — F17210 Nicotine dependence, cigarettes, uncomplicated: Secondary | ICD-10-CM | POA: Insufficient documentation

## 2019-08-02 DIAGNOSIS — Z888 Allergy status to other drugs, medicaments and biological substances status: Secondary | ICD-10-CM | POA: Insufficient documentation

## 2019-08-02 DIAGNOSIS — E785 Hyperlipidemia, unspecified: Secondary | ICD-10-CM | POA: Diagnosis not present

## 2019-08-02 DIAGNOSIS — N611 Abscess of the breast and nipple: Secondary | ICD-10-CM | POA: Diagnosis not present

## 2019-08-02 DIAGNOSIS — I10 Essential (primary) hypertension: Secondary | ICD-10-CM | POA: Insufficient documentation

## 2019-08-02 DIAGNOSIS — Z886 Allergy status to analgesic agent status: Secondary | ICD-10-CM | POA: Diagnosis not present

## 2019-08-02 DIAGNOSIS — Z79899 Other long term (current) drug therapy: Secondary | ICD-10-CM | POA: Diagnosis not present

## 2019-08-02 DIAGNOSIS — N644 Mastodynia: Secondary | ICD-10-CM | POA: Diagnosis present

## 2019-08-02 DIAGNOSIS — L0291 Cutaneous abscess, unspecified: Secondary | ICD-10-CM

## 2019-08-02 LAB — COMPREHENSIVE METABOLIC PANEL
ALT: 25 U/L (ref 0–44)
AST: 14 U/L — ABNORMAL LOW (ref 15–41)
Albumin: 2.9 g/dL — ABNORMAL LOW (ref 3.5–5.0)
Alkaline Phosphatase: 88 U/L (ref 38–126)
Anion gap: 11 (ref 5–15)
BUN: 15 mg/dL (ref 6–20)
CO2: 27 mmol/L (ref 22–32)
Calcium: 9 mg/dL (ref 8.9–10.3)
Chloride: 98 mmol/L (ref 98–111)
Creatinine, Ser: 0.59 mg/dL (ref 0.44–1.00)
GFR calc Af Amer: >60 mL/min (ref >60)
GFR calc non Af Amer: >60 mL/min (ref >60)
Glucose, Bld: 230 mg/dL — ABNORMAL HIGH (ref 70–99)
Potassium: 3.7 mmol/L (ref 3.5–5.1)
Sodium: 136 mmol/L (ref 135–145)
Total Bilirubin: 0.8 mg/dL (ref 0.3–1.2)
Total Protein: 7.1 g/dL (ref 6.5–8.1)

## 2019-08-02 LAB — CBC WITH DIFFERENTIAL/PLATELET
Abs Immature Granulocytes: 0.02 10*3/uL (ref 0.00–0.07)
Basophils Absolute: 0 10*3/uL (ref 0.0–0.1)
Basophils Relative: 1 %
Eosinophils Absolute: 0.1 10*3/uL (ref 0.0–0.5)
Eosinophils Relative: 1 %
HCT: 48.6 % — ABNORMAL HIGH (ref 36.0–46.0)
Hemoglobin: 15.5 g/dL — ABNORMAL HIGH (ref 12.0–15.0)
Immature Granulocytes: 0 %
Lymphocytes Relative: 32 %
Lymphs Abs: 2.5 10*3/uL (ref 0.7–4.0)
MCH: 28.8 pg (ref 26.0–34.0)
MCHC: 31.9 g/dL (ref 30.0–36.0)
MCV: 90.2 fL (ref 80.0–100.0)
Monocytes Absolute: 0.8 10*3/uL (ref 0.1–1.0)
Monocytes Relative: 10 %
Neutro Abs: 4.3 10*3/uL (ref 1.7–7.7)
Neutrophils Relative %: 56 %
Platelets: 270 10*3/uL (ref 150–400)
RBC: 5.39 MIL/uL — ABNORMAL HIGH (ref 3.87–5.11)
RDW: 13.5 % (ref 11.5–15.5)
WBC: 7.7 10*3/uL (ref 4.0–10.5)
nRBC: 0 % (ref 0.0–0.2)

## 2019-08-02 MED ORDER — SULFAMETHOXAZOLE-TRIMETHOPRIM 800-160 MG PO TABS: 1.0000 | ORAL_TABLET | Freq: Two times a day (BID) | ORAL | 0 refills | Status: AC

## 2019-08-02 MED ORDER — OXYCODONE-ACETAMINOPHEN 5-325 MG PO TABS
1.0000 | ORAL_TABLET | Freq: Once | ORAL | Status: AC
  Administered 2019-08-02: 1 via ORAL
  Filled 2019-08-02: qty 1

## 2019-08-02 MED FILL — SULFAMETHOXAZOLE-TMP DS TAB: 800-160 | 7 days supply | Qty: 14 | Fill #0

## 2019-08-02 NOTE — ED Triage Notes (Signed)
Right breast pain, redness and knot felt for a week. States she had an abscess of the same breast 2 years ago and was admitted and had a wound vac.

## 2019-08-02 NOTE — ED Provider Notes (Signed)
[facility] EM[facility]ENCY DEPARTMENT Provider Note   CSN: [id number] Arrival date & time: 08/02/19  1018     History   Chief Complaint Chief Complaint  Patient presents with  . Abscess    HPI Catherine Espinoza is a 43 y.o. female.     Catherine Espinoza is a 43 yo F with a PMHx of hidradenitis suppurativa, DM, HTN, obesity and tobacco use who presents with a one week of right breast pain, erythema and swelling in an area of a prior abscess requiring surgical I&D and wound vac placement. She reports using hot compresses on the swelling over the past week which hasn't provided much relief. Patient denies systemic sx such as fever, chills, myalgias. She has had a cough recently for which she got a COVID test which was negative on October 21st. She reports some other sores around her groin and axilla but without boil or abscess formation.      Past Medical History:  Diagnosis Date  . Anemia, unspecified   . Excessive or frequent menstruation   . Morbid obesity ([place])   . Obstructive sleep apnea 02/11/11   Sleep study: severe OSA- rec CPAP 20cm small  full face mask  . Proteinuria   . Type II or unspecified type diabetes mellitus without mention of complication, not stated as uncontrolled   . Unspecified essential hypertension     Patient Active Problem List   Diagnosis Date Noted  . Preventative health care 06/21/2016  . GERD (gastroesophageal reflux disease) 12/25/2014  . Onychomycosis 08/11/2014  . HTN (hypertension) 10/11/2013  . Hidradenitis suppurativa 08/02/2011  . Fatigue 08/02/2011  . Obstructive sleep apnea 02/15/2011  . Hypertension 02/15/2011  . Hyperlipidemia 01/16/2011  . PROTEINURIA 11/16/2009  . Diabetes type 2, uncontrolled ([place]) 11/03/2009  . ANEMIA 11/03/2009  . MORBID OBESITY 10/31/2009  . TOBACCO ABUSE 10/31/2009    Past Surgical History:  Procedure Laterality Date  . ABDOMINAL HYSTERECTOMY    . ABLATION COLPOCLESIS    . CESAREAN SECTION    .  CLEFT PALATE REPAIR    . cyst removal    . ECTOPIC PREGNANCY SURGERY     x 2  . OOPHORECTOMY Right 2002     OB History   No obstetric history on file.      Home Medications    Prior to Admission medications   Medication Sig Start Date End Date Taking? Authorizing Provider  amLODipine (NORVASC) 10 MG tablet Take 1 tablet (10 mg total) by mouth daily. 05/25/19   [name], NP  atorvastatin (LIPITOR) 40 MG tablet Take 1 tablet (40 mg total) by mouth daily. 05/25/19   [name], NP  BAYER MICROLET LANCETS lancets Use as instructed to check blood sugar three times daily.  DX  E11.65 10/24/17   [name], NP  Dulaglutide (TRULICITY) A999333 0000000 SOPN Inject 0.75 mg into the skin once a week. 06/29/19   [name], NP  DULoxetine (CYMBALTA) 30 MG capsule Take 2 capsules (60 mg total) by mouth daily. 07/06/19   [name], NP  glucose blood (BAYER CONTOUR TEST) test strip Use to check blood sugar 3 times per day.  DX  E11.65 10/24/17   [name], NP  Insulin Glargine (LANTUS SOLOSTAR) 100 UNIT/ML Solostar Pen Inject 65 Units into the skin daily. And pen needles 1/day 05/28/19   [name], NP  oxyCODONE-acetaminophen (PERCOCET) 5-325 MG tablet Take 1 tablet by mouth every 8 (eight) hours as needed for severe pain. 07/27/19   Inda Castle,  Melissa, NP  sitaGLIPtin (JANUVIA) 100 MG tablet Take 1 tablet (100 mg total) by mouth daily. 09/29/18   [name], NP  valACYclovir (VALTREX) 1000 MG tablet Take 1 tablet by mouth once daily for 5 days at start of rash 02/20/18   [name], NP  lisinopril (ZESTRIL) 20 MG tablet Take 1 tablet (20 mg total) by mouth daily. 05/25/19 07/06/19  [name], NP    Family History Family History  Problem Relation Age of Onset  . Heart attack Maternal Aunt   . Diabetes Mother   . Cancer Father        oral cancer    Social History Social History   Tobacco Use  . Smoking  status: Current Every Day Smoker    Packs/day: 0.50    Years: 10.00    Pack years: 5.00    Types: Cigarettes  . Smokeless tobacco: Never Used  Substance Use Topics  . Alcohol use: Yes    Alcohol/week: 0.0 standard drinks    Comment: occasional use  . Drug use: No     Allergies   Ibuprofen, Labetalol, and Aspirin   Review of Systems Review of Systems  Constitutional: Negative for chills and fever.  HENT: Negative for sore throat.   Respiratory: Positive for cough.   Cardiovascular: Negative for chest pain.  Gastrointestinal: Negative for abdominal pain.       No perianal abscesses per patient   Genitourinary:       Small sores of groin   Musculoskeletal: Positive for back pain (chronic).  Skin: Positive for color change (erythema and edema of the right breast).  Neurological: Negative for headaches.  Psychiatric/Behavioral: The patient is nervous/anxious.    Physical Exam Updated Vital Signs BP (!) 148/94   Pulse 96   Temp 98.2 F (36.8 C) (Oral)   Resp 16   Ht 5\' 5"  (1.651 m)   Wt 117.9 kg   LMP 09/13/2011   SpO2 96%   BMI 43.27 kg/m   Physical Exam Constitutional:      General: She is in acute distress.     Appearance: She is obese.  HENT:     Head: Normocephalic and atraumatic.  Eyes:     Conjunctiva/sclera: Conjunctivae normal.     Comments: Patient tearful  Neck:     Musculoskeletal: Normal range of motion.  Cardiovascular:     Rate and Rhythm: Normal rate and regular rhythm.     Pulses: Normal pulses.     Heart sounds: No murmur.  Pulmonary:     Effort: Pulmonary effort is normal. No respiratory distress.  Abdominal:     Palpations: Abdomen is soft.  Musculoskeletal:     Comments: 4x3cm area of induration over the medial right areola warm and tender to palpation  Skin:    General: Skin is warm and dry.  Neurological:     General: No focal deficit present.     Mental Status: She is alert and oriented to person, place, and time.   Psychiatric:        Mood and Affect: Mood normal.      ED Treatments / Results  Labs (all labs ordered are listed, but only abnormal results are displayed) Labs Reviewed  COMPREHENSIVE METABOLIC PANEL  CBC WITH DIFFERENTIAL/PLATELET    EKG None  Radiology No results found.  Procedures Procedures (including critical care time)  Medications Ordered in ED Medications - No data to display   Initial Impression / Assessment and Plan / ED Course  I have reviewed the triage vital signs and the nursing notes.  Pertinent labs & imaging results that were available during my care of the patient were reviewed by me and considered in my medical decision making (see chart for details).        Catherine Espinoza is a 43 yo F with a PMHx of hidradenitis suppurativa, DM, HTN, obesity and tobacco use who presents with a right breast abscess in an area of a prior abscess requiring surgical I&D and wound vac placement concerning for a hidradenitis suppurativa flare. Patient denies systemic sx such as fever, chills, myalgias. On exam there is a 4x3 cm area of induration over the medial right areola which is warm and tender to palpation.  Will check labs including CBC and CMP with plan for outpatient antibiotics and referral to breast clinic unless patient picture concerning for SIRS after labwork.   Final Clinical Impressions(s) / ED Diagnoses   Final diagnoses:  Abscess   CBC without leukocytosis. No concern for systemic infection or SIRS at this time. Will treat pain with one time percocet and discharge with bactrim and referral to breast clinic for treatment. Discussed with patient the importance of smoking cessation and offerred assistance which patient declined at this time.  ED Discharge Orders         Ordered    Ambulatory referral to Breast Clinic    Comments: Right breast abscess   08/02/19 1350    US BREAST ASPIRATION RIGHT     08/02/19 1350           Al Decant, MD  08/02/19 1437    Margette Fast, MD 08/02/19 1931

## 2019-08-02 NOTE — Discharge Instructions (Addendum)
You were seen today for a right breast abscess. We obtained blood work which was normal. We treated your pain. We are giving you oral antibiotics and referred you to the breast center for treatment.

## 2019-08-04 MED FILL — OXYCODONE-ACETAMINOPHEN 5-3: 5-325 | 30 days supply | Qty: 90 | Fill #0

## 2019-08-12 DIAGNOSIS — K8020 Calculus of gallbladder without cholecystitis without obstruction: Secondary | ICD-10-CM | POA: Insufficient documentation

## 2019-08-24 ENCOUNTER — Encounter: Payer: Self-pay | Admitting: Family

## 2019-08-24 MED ORDER — OXYCODONE-ACETAMINOPHEN 5-325 MG PO TABS: 1.0000 | ORAL_TABLET | Freq: Three times a day (TID) | ORAL | 0 refills | Status: DC | PRN

## 2019-08-24 NOTE — Telephone Encounter (Signed)
Last Oxycodone-Acetaminophen RX: 07/27/19, #90. NOT TO FILL BEFORE 08/04/19 Last OV: 07/20/19 Next OV: due 10/20/19 but not yet scheduled UDS: 07/06/19 CSC: 07/06/19

## 2019-09-03 MED FILL — OXYCODONE-ACETAMINOPHEN 5-3: 5-325 | 30 days supply | Qty: 90 | Fill #0

## 2019-09-29 ENCOUNTER — Encounter: Payer: Self-pay | Admitting: Family

## 2019-09-30 ENCOUNTER — Telehealth: Payer: Self-pay | Admitting: Medical

## 2019-09-30 MED ORDER — OXYCODONE-ACETAMINOPHEN 5-325 MG PO TABS: 1.0000 | ORAL_TABLET | Freq: Three times a day (TID) | ORAL | 0 refills | Status: DC | PRN

## 2019-09-30 NOTE — Telephone Encounter (Signed)
Rx refill of oxycdone. Pt up to date on contract and uds. She is aware refill date 10-03-2018.

## 2019-10-04 MED FILL — OXYCODONE-ACETAMINOPHEN 5-3: 5-325 | 30 days supply | Qty: 90 | Fill #0

## 2019-10-20 ENCOUNTER — Encounter: Payer: Self-pay | Admitting: Family

## 2019-10-20 DIAGNOSIS — G894 Chronic pain syndrome: Secondary | ICD-10-CM

## 2019-10-27 ENCOUNTER — Encounter: Payer: Self-pay | Admitting: Family

## 2019-10-27 DIAGNOSIS — G894 Chronic pain syndrome: Secondary | ICD-10-CM

## 2019-10-28 MED ORDER — OXYCODONE-ACETAMINOPHEN 5-325 MG PO TABS: 1.0000 | ORAL_TABLET | Freq: Three times a day (TID) | ORAL | 0 refills | Status: DC | PRN

## 2019-11-03 ENCOUNTER — Other Ambulatory Visit: Payer: Self-pay

## 2019-11-03 ENCOUNTER — Encounter (HOSPITAL_BASED_OUTPATIENT_CLINIC_OR_DEPARTMENT_OTHER): Payer: Self-pay | Admitting: Emergency Medicine

## 2019-11-03 ENCOUNTER — Emergency Department (HOSPITAL_BASED_OUTPATIENT_CLINIC_OR_DEPARTMENT_OTHER)
Admission: EM | Admit: 2019-11-03 | Discharge: 2019-11-04 | Disposition: A | Discharge status: Home / Self Care | Payer: No Typology Code available for payment source | Attending: Emergency Medicine | Admitting: Emergency Medicine

## 2019-11-03 DIAGNOSIS — E119 Type 2 diabetes mellitus without complications: Secondary | ICD-10-CM | POA: Insufficient documentation

## 2019-11-03 DIAGNOSIS — M545 Low back pain, unspecified: Secondary | ICD-10-CM

## 2019-11-03 DIAGNOSIS — Z794 Long term (current) use of insulin: Secondary | ICD-10-CM | POA: Insufficient documentation

## 2019-11-03 DIAGNOSIS — F17210 Nicotine dependence, cigarettes, uncomplicated: Secondary | ICD-10-CM | POA: Insufficient documentation

## 2019-11-03 DIAGNOSIS — I10 Essential (primary) hypertension: Secondary | ICD-10-CM | POA: Insufficient documentation

## 2019-11-03 DIAGNOSIS — G8929 Other chronic pain: Secondary | ICD-10-CM

## 2019-11-03 DIAGNOSIS — Z79899 Other long term (current) drug therapy: Secondary | ICD-10-CM | POA: Insufficient documentation

## 2019-11-03 DIAGNOSIS — M62830 Muscle spasm of back: Secondary | ICD-10-CM | POA: Insufficient documentation

## 2019-11-03 HISTORY — DX: Radiculopathy, thoracic region: M54.14

## 2019-11-03 HISTORY — DX: Other chronic pain: G89.29

## 2019-11-03 NOTE — ED Triage Notes (Signed)
Pt c/o chronic back pain , out of percocet x 1 day ago, referred to pain management  By PMD , waiting for appt

## 2019-11-03 NOTE — ED Provider Notes (Signed)
[facility] Provider Note: Georgena Spurling, MD, FACEP  CSN: [id number] MRN: [id number] ARRIVAL: 11/03/19 at 2338 ROOM: [place]  Back Pain   HISTORY OF PRESENT ILLNESS  11/03/19 11:56 PM Catherine Espinoza is a 44 y.o. female with chronic back pain previously on 90 Percocet 5/325 tablets/month prescribed by Mackie Pai, PA-C.  This was recently refilled by Dr. Elisabeth Cara but the patient does not pick up the prescription until tomorrow.  She is requesting pain relief overnight.  She is having pain in various locations in her back, principally her left lower back which is burning in nature.  She rates it as a 10 out of 10, worse with movement.  She also has a "knot" medial to her left scapula which is also painful.    Past Medical History:  Diagnosis Date  . Anemia, unspecified   . Chronic left-sided thoracic back pain   . Excessive or frequent menstruation   . Morbid obesity ([place])   . Obstructive sleep apnea 02/11/11   Sleep study: severe OSA- rec CPAP 20cm small  full face mask  . Proteinuria   . Thoracic radiculopathy   . Type II or unspecified type diabetes mellitus without mention of complication, not stated as uncontrolled   . Unspecified essential hypertension     Past Surgical History:  Procedure Laterality Date  . ABDOMINAL HYSTERECTOMY    . ABLATION COLPOCLESIS    . CESAREAN SECTION    . CLEFT PALATE REPAIR    . cyst removal    . ECTOPIC PREGNANCY SURGERY     x 2  . OOPHORECTOMY Right 2002    Family History  Problem Relation Age of Onset  . Heart attack Maternal Aunt   . Diabetes Mother   . Cancer Father        oral cancer    Social History   Tobacco Use  . Smoking status: Current Every Day Smoker    Packs/day: 0.50    Years: 10.00    Pack years: 5.00    Types: Cigarettes  . Smokeless tobacco: Never Used  Substance Use Topics  . Alcohol use: Yes    Alcohol/week: 0.0 standard drinks    Comment: occasional use  . Drug use:  No    Prior to Admission medications   Medication Sig Start Date End Date Taking? Authorizing Provider  amLODipine (NORVASC) 10 MG tablet Take 1 tablet (10 mg total) by mouth daily. 05/25/19   [name], NP  atorvastatin (LIPITOR) 40 MG tablet Take 1 tablet (40 mg total) by mouth daily. 05/25/19   [name], NP  BAYER MICROLET LANCETS lancets Use as instructed to check blood sugar three times daily.  DX  E11.65 10/24/17   [name], NP  Dulaglutide (TRULICITY) [id number] [id number] SOPN Inject 0.75 mg into the skin once a week. 06/29/19   [name], NP  DULoxetine (CYMBALTA) 30 MG capsule Take 2 capsules (60 mg total) by mouth daily. 07/06/19   [name], NP  glucose blood (BAYER CONTOUR TEST) test strip Use to check blood sugar 3 times per day.  DX  E11.65 10/24/17   [name], NP  Insulin Glargine (LANTUS SOLOSTAR) 100 UNIT/ML Solostar Pen Inject 65 Units into the skin daily. And pen needles 1/day 05/28/19   [name], NP  oxyCODONE-acetaminophen (PERCOCET) 5-325 MG tablet Take 1-2 tablets by mouth every 4 (four) hours as needed for severe pain. 11/04/19   Josejulian Tarango, MD  sitaGLIPtin (JANUVIA) 100 MG  tablet Take 1 tablet (100 mg total) by mouth daily. 09/29/18   [name], NP  valACYclovir (VALTREX) 1000 MG tablet Take 1 tablet by mouth once daily for 5 days at start of rash 02/20/18   [name], NP  lisinopril (ZESTRIL) 20 MG tablet Take 1 tablet (20 mg total) by mouth daily. 05/25/19 07/06/19  [name], NP    Allergies Ibuprofen, Labetalol, and Aspirin   REVIEW OF SYSTEMS  Negative except as noted here or in the History of Present Illness.   PHYSICAL EXAMINATION  Initial Vital Signs Blood pressure (!) 164/100, pulse (!) 116, temperature 98 F (36.7 C), resp. rate (!) 22, height 5\' 5"  (1.651 m), weight 98 kg, last menstrual period 09/13/2011, SpO2 99 %.  Examination General: Well-developed,  well-nourished female in no acute distress; appearance consistent with age of record HENT: normocephalic; atraumatic Eyes: Normal appearance Neck: supple Heart: regular rate and rhythm Lungs: clear to auscultation bilaterally Abdomen: soft; nondistended; nontender; bowel sounds present Back: Palpable muscle spasm medial to the left scapula; left paralumbar tenderness with positive straight leg raise on the left Extremities: No deformity; full range of motion; pulses normal Neurologic: Awake, alert and oriented; motor function intact in all extremities and symmetric; no facial droop Skin: Warm and dry Psychiatric: Tearful   RESULTS  Summary of this visit's results, reviewed and interpreted by myself:   EKG Interpretation  Date/Time:    Ventricular Rate:    PR Interval:    QRS Duration:   QT Interval:    QTC Calculation:   R Axis:     Text Interpretation:        Laboratory Studies: No results found for this or any previous visit (from the past 24 hour(s)). Imaging Studies: No results found.  ED COURSE and MDM  Nursing notes, initial and subsequent vitals signs, including pulse oximetry, reviewed and interpreted by myself.  Vitals:   11/03/19 2344  BP: (!) 164/100  Pulse: (!) 116  Resp: (!) 22  Temp: 98 F (36.7 C)  SpO2: 99%  Weight: 98 kg  Height: 5\' 5"  (1.651 m)   Medications  HYDROmorphone (DILAUDID) injection 2 mg (has no administration in time range)  ondansetron (ZOFRAN-ODT) disintegrating tablet 8 mg (has no administration in time range)    We will treat patient's pain overnight pending prescription refill later this morning.  She is being referred to the [facility] but has not yet been able to make an appointment.  PROCEDURES  Procedures   ED DIAGNOSES     ICD-10-CM   1. Chronic left-sided low back pain without sciatica  M54.5    G89.29   2. Muscle spasm of back  M62.830        Donnis Phaneuf, Jenny Reichmann, MD 11/04/19 0010

## 2019-11-04 ENCOUNTER — Other Ambulatory Visit: Payer: Self-pay | Admitting: Family

## 2019-11-04 MED ORDER — ONDANSETRON 8 MG PO TBDP
8.0000 mg | ORAL_TABLET | Freq: Once | ORAL | Status: AC
  Administered 2019-11-04: 8 mg via ORAL
  Filled 2019-11-04: qty 1

## 2019-11-04 MED ORDER — OXYCODONE-ACETAMINOPHEN 5-325 MG PO TABS: 1.0000 | ORAL_TABLET | ORAL | 0 refills | Status: DC | PRN

## 2019-11-04 MED ORDER — HYDROMORPHONE HCL 1 MG/ML IJ SOLN
2.0000 mg | Freq: Once | INTRAMUSCULAR | Status: AC
  Administered 2019-11-04: 2 mg via INTRAMUSCULAR
  Filled 2019-11-04: qty 2

## 2019-11-04 MED FILL — OXYCODONE-ACETAMINOPHEN 5-3: 5-325 | 30 days supply | Qty: 90 | Fill #0

## 2019-11-04 NOTE — Addendum Note (Signed)
Addended by: [name] on: 11/04/2019 01:55 PM   Modules accepted: Orders

## 2019-11-04 NOTE — Telephone Encounter (Signed)
Catherine Espinoza ,   Per Previous note patient should have been referral to a new Pain Mang. Per Gwen a new referral needs to be sent since the 1st one was denied.

## 2019-11-05 ENCOUNTER — Ambulatory Visit: Payer: No Typology Code available for payment source | Admitting: Family

## 2019-11-10 ENCOUNTER — Telehealth: Payer: Self-pay

## 2019-11-10 NOTE — Telephone Encounter (Signed)
Called the [facility] and informed Linwood Dibbles of Melissa O'Sullivan's NPI number.

## 2019-11-10 NOTE — Telephone Encounter (Signed)
Catherine Espinoza from [facility] needs Dr. Conley Canal to send over here MPI number.

## 2020-02-25 ENCOUNTER — Ambulatory Visit: Payer: Medicaid Other | Admitting: Family

## 2020-03-15 ENCOUNTER — Encounter: Payer: Self-pay | Admitting: Family

## 2020-03-15 ENCOUNTER — Ambulatory Visit (INDEPENDENT_AMBULATORY_CARE_PROVIDER_SITE_OTHER): Payer: Self-pay | Admitting: Family

## 2020-03-15 ENCOUNTER — Other Ambulatory Visit: Payer: Self-pay

## 2020-03-15 VITALS — BP 151/97 | HR 104 | Temp 97.8°F | Resp 16 | Ht 65.0 in | Wt 200.0 lb

## 2020-03-15 DIAGNOSIS — G894 Chronic pain syndrome: Secondary | ICD-10-CM

## 2020-03-15 DIAGNOSIS — I10 Essential (primary) hypertension: Secondary | ICD-10-CM

## 2020-03-15 DIAGNOSIS — E1165 Type 2 diabetes mellitus with hyperglycemia: Secondary | ICD-10-CM

## 2020-03-15 DIAGNOSIS — I159 Secondary hypertension, unspecified: Secondary | ICD-10-CM

## 2020-03-15 DIAGNOSIS — R634 Abnormal weight loss: Secondary | ICD-10-CM

## 2020-03-15 DIAGNOSIS — E785 Hyperlipidemia, unspecified: Secondary | ICD-10-CM

## 2020-03-15 LAB — COMPREHENSIVE METABOLIC PANEL
ALT: 13 U/L (ref 0–35)
AST: 9 U/L (ref 0–37)
Albumin: 3.3 g/dL — ABNORMAL LOW (ref 3.5–5.2)
Alkaline Phosphatase: 71 U/L (ref 39–117)
BUN: 9 mg/dL (ref 6–23)
CO2: 29 mEq/L (ref 19–32)
Calcium: 9.5 mg/dL (ref 8.4–10.5)
Chloride: 101 mEq/L (ref 96–112)
Creatinine, Ser: 0.47 mg/dL (ref 0.40–1.20)
GFR: 174.07 mL/min (ref >60.00)
Glucose, Bld: 171 mg/dL — ABNORMAL HIGH (ref 70–99)
Potassium: 4.3 mEq/L (ref 3.5–5.1)
Sodium: 136 mEq/L (ref 135–145)
Total Bilirubin: 0.3 mg/dL (ref 0.2–1.2)
Total Protein: 6.5 g/dL (ref 6.0–8.3)

## 2020-03-15 LAB — CBC WITH DIFFERENTIAL/PLATELET
Basophils Absolute: 0.1 10*3/uL (ref 0.0–0.1)
Basophils Relative: 0.8 % (ref 0.0–3.0)
Eosinophils Absolute: 0.2 10*3/uL (ref 0.0–0.7)
Eosinophils Relative: 1.6 % (ref 0.0–5.0)
HCT: 43.4 % (ref 36.0–46.0)
Hemoglobin: 14.7 g/dL (ref 12.0–15.0)
Lymphocytes Relative: 31.2 % (ref 12.0–46.0)
Lymphs Abs: 3.1 10*3/uL (ref 0.7–4.0)
MCHC: 34 g/dL (ref 30.0–36.0)
MCV: 92.3 fl (ref 78.0–100.0)
Monocytes Absolute: 0.5 10*3/uL (ref 0.1–1.0)
Monocytes Relative: 5.5 % (ref 3.0–12.0)
Neutro Abs: 6 10*3/uL (ref 1.4–7.7)
Neutrophils Relative %: 60.9 % (ref 43.0–77.0)
Platelets: 251 10*3/uL (ref 150.0–400.0)
RBC: 4.7 Mil/uL (ref 3.87–5.11)
RDW: 14 % (ref 11.5–15.5)
WBC: 9.8 10*3/uL (ref 4.0–10.5)

## 2020-03-15 LAB — TSH: TSH: 1.01 u[IU]/mL (ref 0.35–4.50)

## 2020-03-15 LAB — HEMOGLOBIN A1C: Hgb A1c MFr Bld: 9.6 % — ABNORMAL HIGH (ref 4.6–6.5)

## 2020-03-15 MED ORDER — LISINOPRIL 20 MG PO TABS: 20.0000 mg | ORAL_TABLET | Freq: Every day | ORAL | 1 refills | Status: DC

## 2020-03-15 MED ORDER — ATORVASTATIN CALCIUM 40 MG PO TABS: 40.0000 mg | ORAL_TABLET | Freq: Every day | ORAL | 1 refills | Status: DC

## 2020-03-15 MED ORDER — DULOXETINE HCL 30 MG PO CPEP
ORAL_CAPSULE | ORAL | 0 refills | Status: DC
Start: 2020-03-15 — End: 2020-10-03

## 2020-03-15 NOTE — Patient Instructions (Signed)
Please restart cymbalta. Complete lab work prior to leaving.

## 2020-03-15 NOTE — Progress Notes (Signed)
Subjective:    Patient ID: Catherine Espinoza, female    DOB: May 30, 1976, 44 y.o.   MRN: [id number]  HPI  Patient is a 44 yr old female who presents today for follow up.  We have not she remains without health insurance with the exception of family-planning Medicaid.  She is currently working but her employers health insurance plan was not helpful for her healthcare needs that she dropped the plan.  She tried to go in the open market and the options available to her would also not cover the majority of her medical expenses.  Therefore she chose not to sign up for the insurance.  As a result of being uninsured she has stopped all her medications and her insulin.  Today her concern is unexplained weight loss.  HTN- not currently on meds.  BP Readings from Last 3 Encounters:  03/15/20 (!) 151/97  11/03/19 (!) 164/100  08/02/19 (!) 163/104   DM2- not taking any medication.   Lab Results  Component Value Date   HGBA1C 14.6 (H) 05/25/2019   HGBA1C 14.9 (H) 06/23/2018   HGBA1C 14.0 01/05/2018   Lab Results  Component Value Date   MICROALBUR 6.6 (H) 06/21/2016   LDLCALC 134 (H) 10/24/2017   CREATININE 0.59 08/02/2019   Chronic back pain- she is maintained on oxycodone which is being prescribed by pain management. Reports that she is self pay.  She was unable to afford MRI. Reports pain "from the top of her neck down both of her legs.  She is working at [employer].  She is out for 2 weeks form work due to back pain.    Morbid obesity-  Wt Readings from Last 3 Encounters:  03/15/20 200 lb (90.7 kg)  11/03/19 216 lb (98 kg)  08/02/19 260 lb (117.9 kg)     Review of Systems See HPI  Past Medical History:  Diagnosis Date  . Anemia, unspecified   . Chronic left-sided thoracic back pain   . Excessive or frequent menstruation   . Morbid obesity ([place])   . Obstructive sleep apnea 02/11/11   Sleep study: severe OSA- rec CPAP 20cm small  full face mask  . Proteinuria   .  Thoracic radiculopathy   . Type II or unspecified type diabetes mellitus without mention of complication, not stated as uncontrolled   . Unspecified essential hypertension      Social History   Socioeconomic History  . Marital status: Single    Spouse name: Not on file  . Number of children: Not on file  . Years of education: Not on file  . Highest education level: Not on file  Occupational History  . Occupation: cna/med Engineer, production: [employer]  Tobacco Use  . Smoking status: Current Every Day Smoker    Packs/day: 0.50    Years: 10.00    Pack years: 5.00    Types: Cigarettes  . Smokeless tobacco: Never Used  Substance and Sexual Activity  . Alcohol use: Yes    Alcohol/week: 0.0 standard drinks    Comment: occasional use  . Drug use: No  . Sexual activity: Yes    Birth control/protection: Surgical  Other Topics Concern  . 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 Difficulty of Paying Living Expenses:   Food Insecurity:   .  Worried About Charity fundraiser in the Last Year:   . Arboriculturist in the Last Year:   Transportation Needs:   . Film/video editor (Medical):   Marland Kitchen Lack of Transportation (Non-Medical):   Physical Activity:   . Days of Exercise per Week:   . Minutes of Exercise per Session:   Stress:   . Feeling of Stress :   Social Connections:   . Frequency of Communication with Friends and Family:   . Frequency of Social Gatherings with Friends and Family:   . Attends Religious Services:   . Active Member of Clubs or Organizations:   . Attends Archivist Meetings:   Marland Kitchen Marital Status:   Intimate Partner Violence:   . Fear of Current or Ex-Partner:   . Emotionally Abused:   Marland Kitchen Physically Abused:   . Sexually Abused:     Past Surgical History:  Procedure Laterality Date  . ABDOMINAL  HYSTERECTOMY    . ABLATION COLPOCLESIS    . CESAREAN SECTION    . CLEFT PALATE REPAIR    . cyst removal    . ECTOPIC PREGNANCY SURGERY     x 2  . OOPHORECTOMY Right 2002    Family History  Problem Relation Age of Onset  . Heart attack Maternal Aunt   . Diabetes Mother   . Cancer Father        oral cancer    Allergies  Allergen Reactions  . Ibuprofen     REACTION: knots in mouth with SOB  . Labetalol Nausea Only  . Aspirin     Knots in mouth     Current Outpatient Medications on File Prior to Visit  Medication Sig Dispense Refill  . oxyCODONE-acetaminophen (PERCOCET) 5-325 MG tablet Take 1-2 tablets by mouth every 4 (four) hours as needed for severe pain. 6 tablet 0  . BAYER MICROLET LANCETS lancets Use as instructed to check blood sugar three times daily.  DX  E11.65 (Patient not taking: Reported on 03/15/2020) 100 each 5  . glucose blood (BAYER CONTOUR TEST) test strip Use to check blood sugar 3 times per day.  DX  E11.65 (Patient not taking: Reported on 03/15/2020) 100 each 5  . Insulin Glargine (LANTUS SOLOSTAR) 100 UNIT/ML Solostar Pen Inject 65 Units into the skin daily. And pen needles 1/day (Patient not taking: Reported on 03/15/2020) 30 mL 5  . valACYclovir (VALTREX) 1000 MG tablet Take 1 tablet by mouth once daily for 5 days at start of rash (Patient not taking: Reported on 03/15/2020) 15 tablet 1   No current facility-administered medications on file prior to visit.    BP (!) 151/97 (BP Location: Right Arm, Patient Position: Sitting, Cuff Size: Small)   Pulse (!) 104   Temp 97.8 F (36.6 C) (Temporal)   Resp 16   Ht 5\' 5"  (1.651 m)   Wt 200 lb (90.7 kg)   LMP 09/13/2011   SpO2 100%   BMI 33.28 kg/m       Objective:   Physical Exam Constitutional:      Appearance: She is well-developed.  Neck:     Thyroid: No thyromegaly.  Cardiovascular:     Rate and Rhythm: Normal rate and regular rhythm.     Heart sounds: Normal heart sounds. No murmur heard.    Pulmonary:     Effort: Pulmonary effort is normal. No respiratory distress.     Breath sounds: Normal breath sounds. No wheezing.  Musculoskeletal:  Cervical back: Neck supple.  Skin:    General: Skin is warm and dry.  Neurological:     Mental Status: She is alert and oriented to person, place, and time.  Psychiatric:        Behavior: Behavior normal.        Thought Content: Thought content normal.        Judgment: Judgment normal.           Assessment & Plan:  HTN-uncontrolled will restart ACE inhibitor.  Obtain complete metabolic panel.  Diabetes type 2-suspect severe uncontrolled for diabetes.  She is unable to afford Lantus insulin.  Will obtain A1c.  Likely will add over-the-counter NPH and regular insulin pending review of these results.  Unexplained weight loss-I suspect that this is related to her poorly controlled diabetes.  I will obtain a complete metabolic panel, CBC, and TSH to further evaluate.  If patient continues progressive weight loss despite improved glycemic control will need to do a more in-depth evaluation to rule out other underlying causes.  Hyperlipidemia-restart statin.  Plan to check lipid panel next time.  Chronic pain syndrome-advised the patient to continue her work with pain management.  We also discussed having her look into switching to a job that has not improved healthcare plan as it seems that this is important for her overall health and she is not having any luck on the open market.  Depression-uncontrolled.  Will resume Cymbalta. PHQ9 SCORE ONLY 03/15/2020 10/24/2017 10/24/2017  PHQ-9 Total Score 19 6 6      This visit occurred during the SARS-CoV-2 public health emergency.  Safety protocols were in place, including screening questions prior to the visit, additional usage of staff PPE, and extensive cleaning of exam room while observing appropriate contact time as indicated for disinfecting solutions.

## 2020-03-16 ENCOUNTER — Telehealth: Payer: Self-pay | Admitting: Family

## 2020-03-16 MED ORDER — "INSULIN SYRINGE 27G X 1/2"" 0.5 ML MISC": 3 refills | Status: DC

## 2020-03-16 MED ORDER — INSULIN NPH (HUMAN) (ISOPHANE) 100 UNIT/ML ~~LOC~~ SUSP
10.0000 [IU] | Freq: Two times a day (BID) | SUBCUTANEOUS | 11 refills | Status: DC
Start: 2020-03-16 — End: 2020-10-03

## 2020-03-16 NOTE — Telephone Encounter (Signed)
Please let pt know that I reviewed her lab work. Sugar is above goal. I would recommend that she start NPH 10 units twice daily (before breakfast and before dinner).  I sent this plus syringes to [facility]. Check sugar twice daily and send me your readings via mychart.

## 2020-03-16 NOTE — Telephone Encounter (Signed)
Patient advised of results and provider's advise. She will pick up medication and start recording sugar levels.

## 2020-04-22 IMAGING — CR ABDOMEN - 2 VIEW
3 series · 3 of 3 positions shown · non-contrast
Comparison: Radiographs March 14, 2014.

CLINICAL DATA: Right flank pain for 2 years.

EXAM:
ABDOMEN - 2 VIEW

[w abdomen upright]
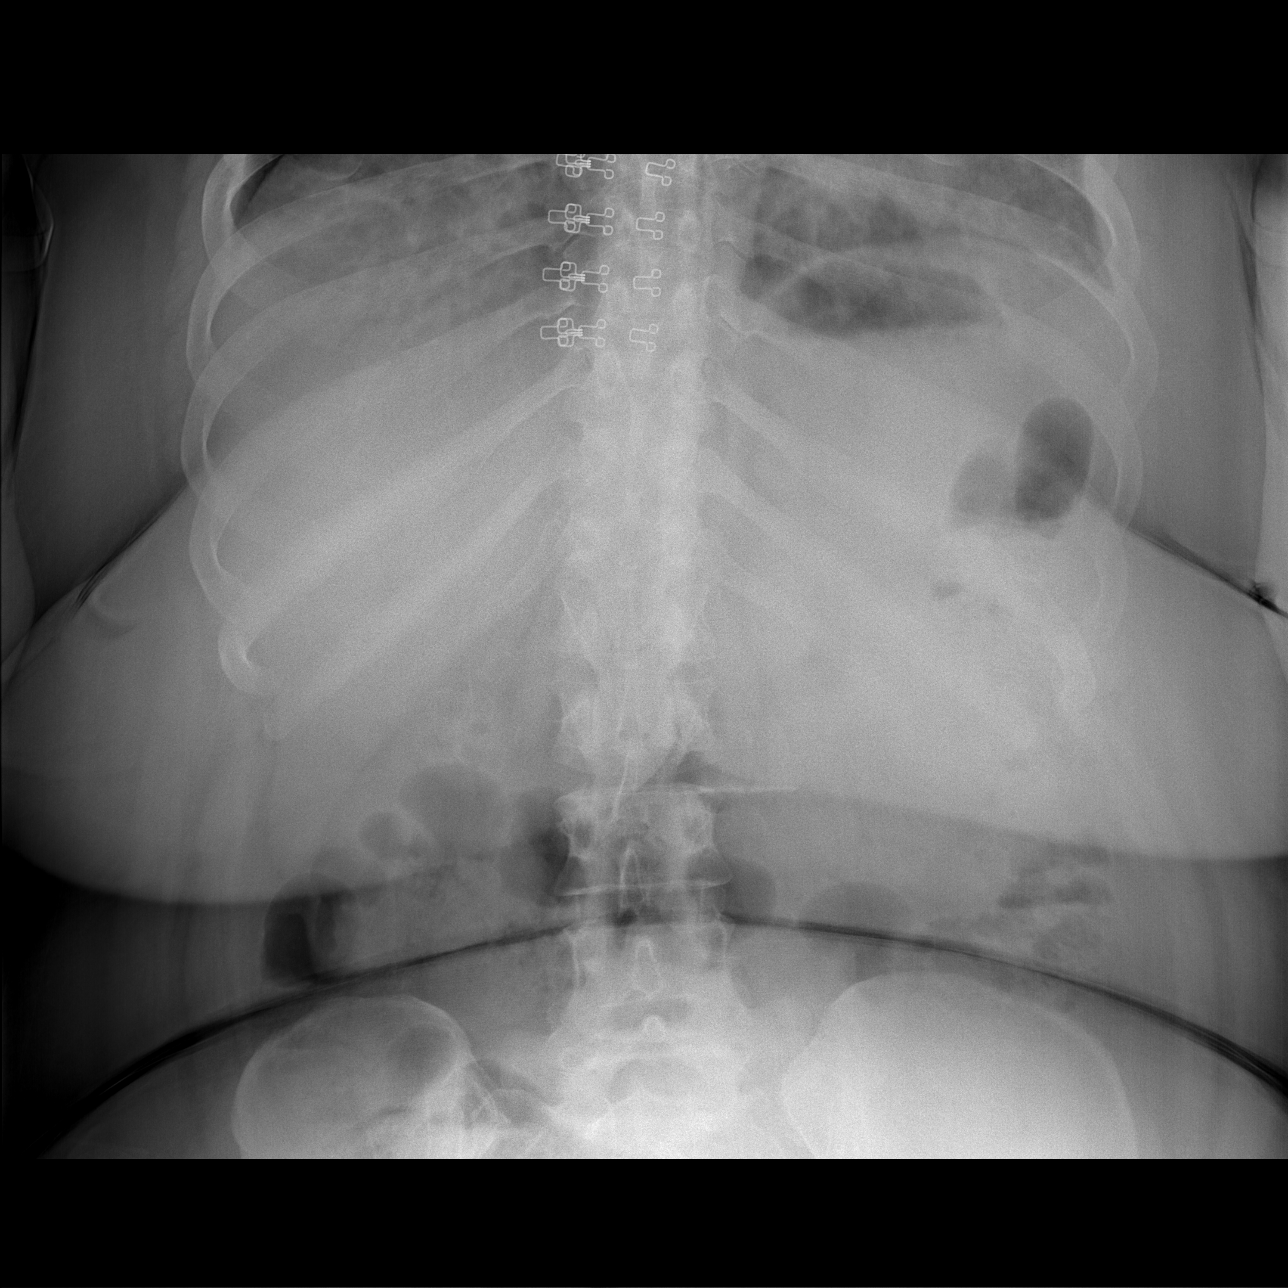

[t abdomen supine (1 of 2)]
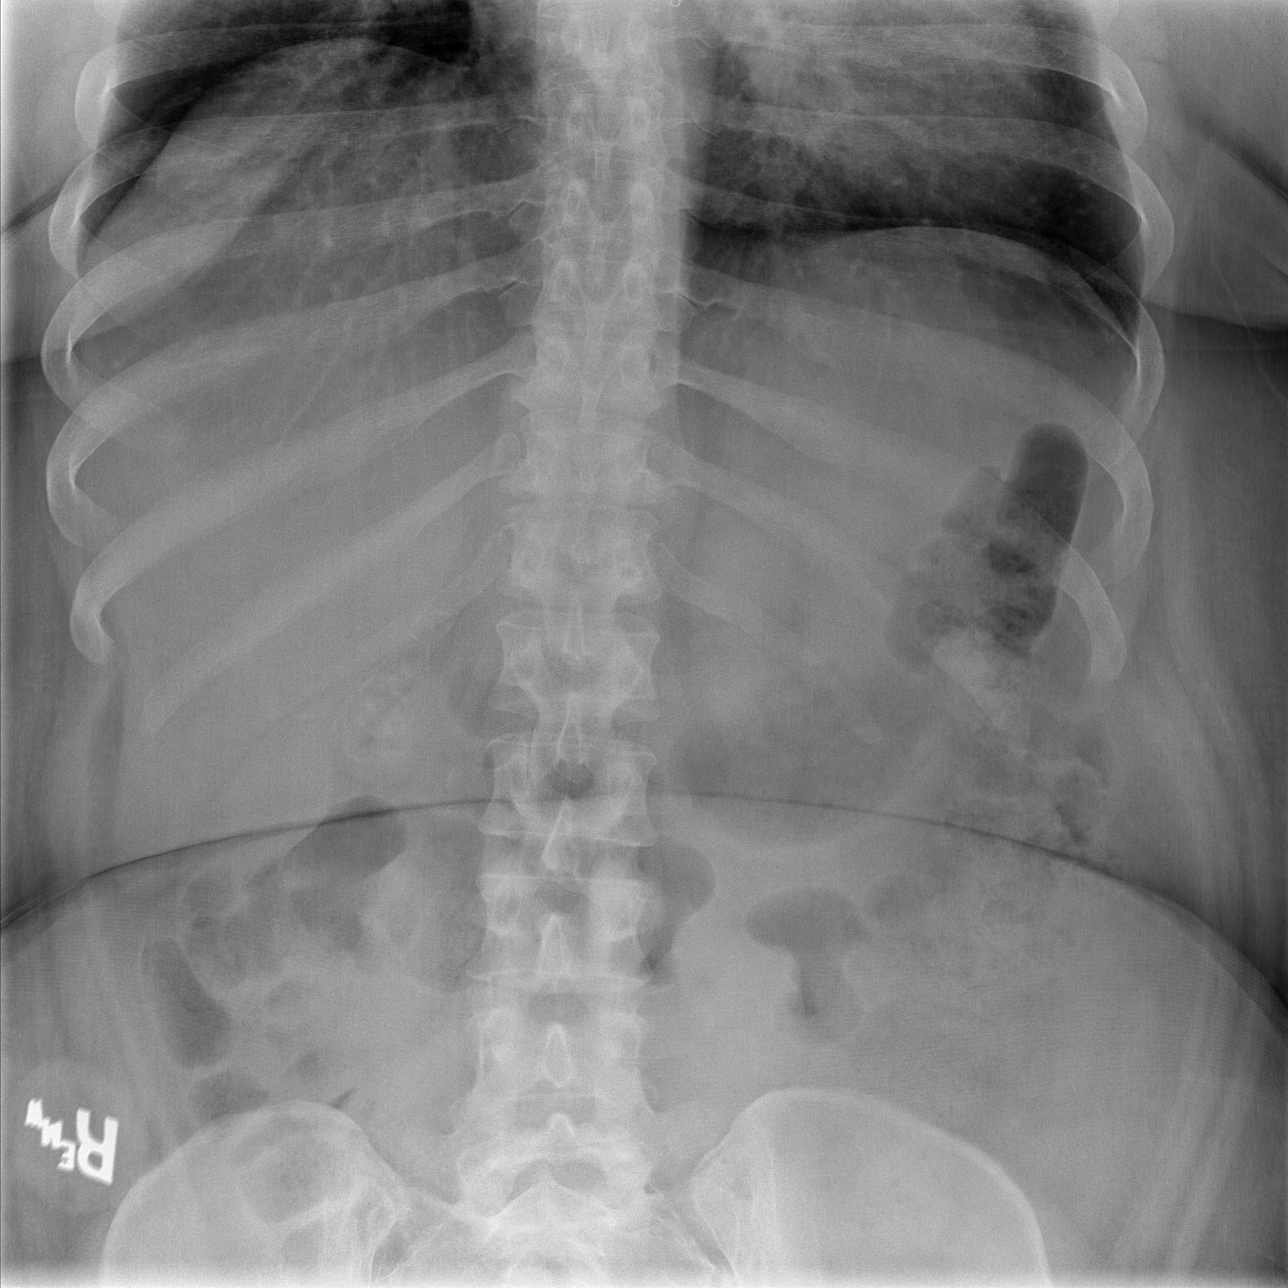

[t abdomen supine (2 of 2)]
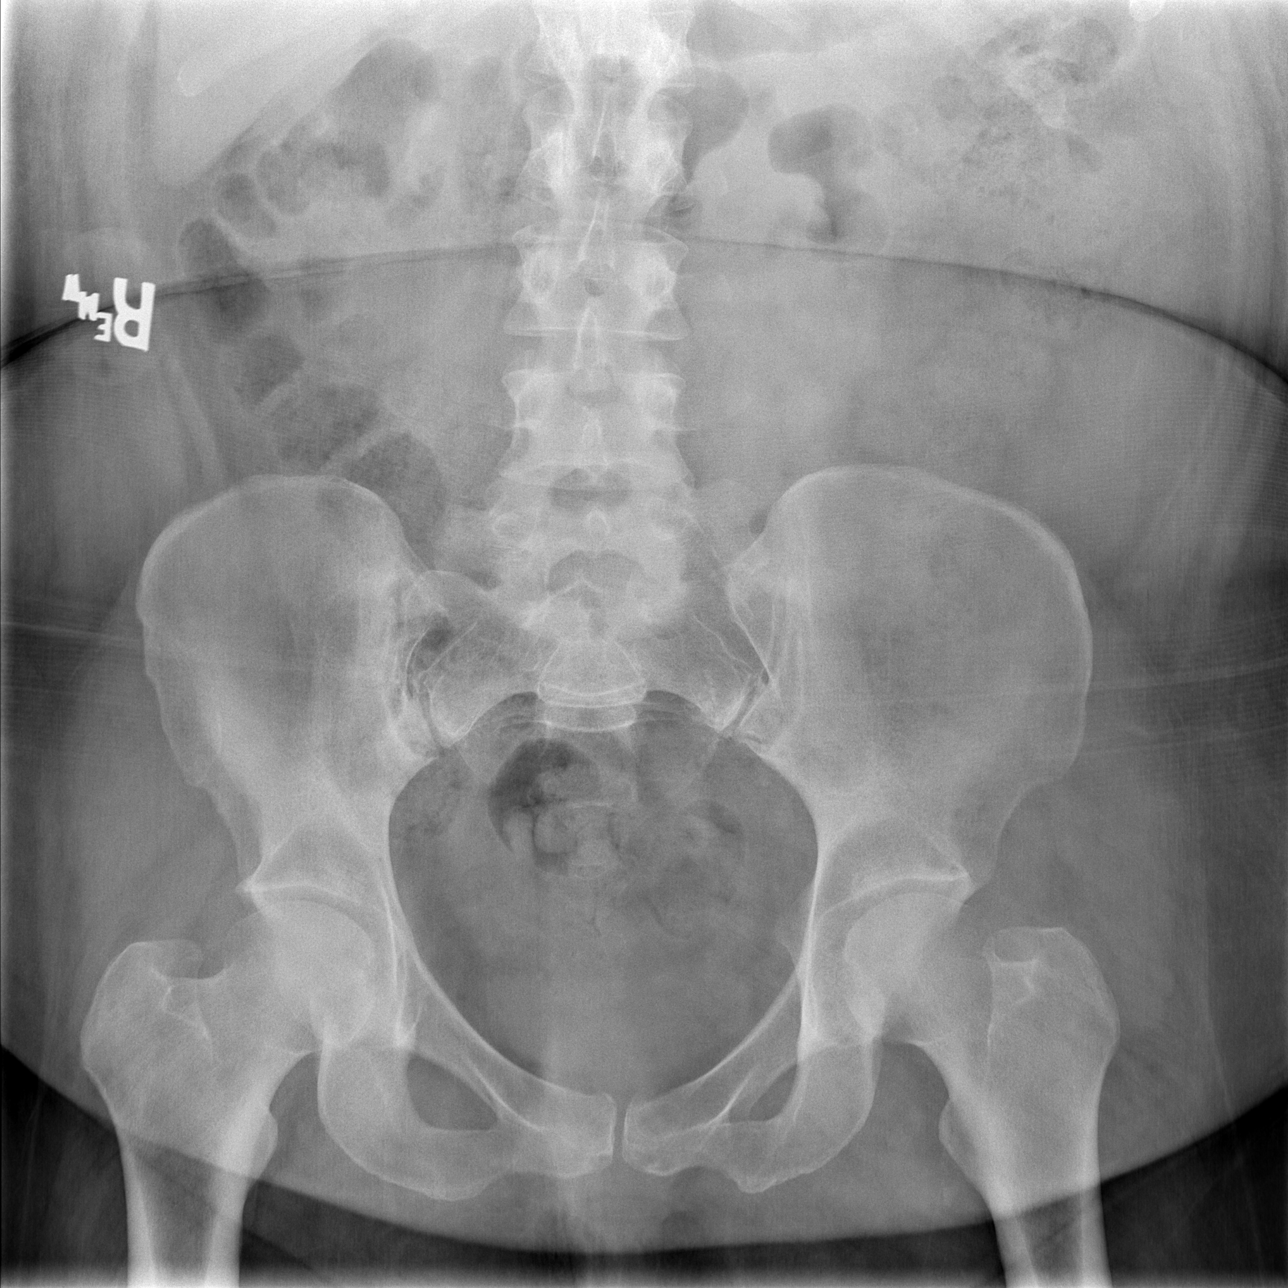

[3 of 3 positions shown; findings below may reference images not displayed]

FINDINGS: The bowel gas pattern is normal. There is no evidence of free air.
No radio-opaque calculi or other significant radiographic
abnormality is seen.
IMPRESSION: No definite evidence of nephrolithiasis. No evidence of bowel
obstruction or ileus.

## 2020-08-16 ENCOUNTER — Ambulatory Visit: Payer: Medicaid Other | Admitting: Family

## 2020-10-03 ENCOUNTER — Ambulatory Visit (INDEPENDENT_AMBULATORY_CARE_PROVIDER_SITE_OTHER): Payer: No Typology Code available for payment source | Admitting: Family

## 2020-10-03 ENCOUNTER — Encounter: Payer: Self-pay | Admitting: Family

## 2020-10-03 ENCOUNTER — Other Ambulatory Visit: Payer: Self-pay

## 2020-10-03 ENCOUNTER — Other Ambulatory Visit (HOSPITAL_BASED_OUTPATIENT_CLINIC_OR_DEPARTMENT_OTHER): Payer: Self-pay | Admitting: Family

## 2020-10-03 VITALS — BP 145/95 | HR 108 | Temp 97.0°F | Resp 16 | Ht 65.0 in | Wt 245.0 lb

## 2020-10-03 DIAGNOSIS — I10 Essential (primary) hypertension: Secondary | ICD-10-CM | POA: Diagnosis not present

## 2020-10-03 DIAGNOSIS — E1165 Type 2 diabetes mellitus with hyperglycemia: Secondary | ICD-10-CM

## 2020-10-03 DIAGNOSIS — E785 Hyperlipidemia, unspecified: Secondary | ICD-10-CM | POA: Diagnosis not present

## 2020-10-03 DIAGNOSIS — L02415 Cutaneous abscess of right lower limb: Secondary | ICD-10-CM

## 2020-10-03 DIAGNOSIS — R600 Localized edema: Secondary | ICD-10-CM

## 2020-10-03 DIAGNOSIS — I83891 Varicose veins of right lower extremities with other complications: Secondary | ICD-10-CM | POA: Diagnosis not present

## 2020-10-03 MED ORDER — VALACYCLOVIR HCL 1 G PO TABS: ORAL_TABLET | ORAL | 1 refills | Status: DC

## 2020-10-03 MED ORDER — FREESTYLE LIBRE SENSOR SYSTEM MISC: 0 refills | Status: DC

## 2020-10-03 MED ORDER — LANTUS SOLOSTAR 100 UNIT/ML ~~LOC~~ SOPN: 65.0000 [IU] | PEN_INJECTOR | Freq: Every day | SUBCUTANEOUS | 5 refills | Status: DC

## 2020-10-03 MED ORDER — LISINOPRIL 20 MG PO TABS: 20.0000 mg | ORAL_TABLET | Freq: Every day | ORAL | 1 refills | Status: DC

## 2020-10-03 MED ORDER — ATORVASTATIN CALCIUM 40 MG PO TABS: 40.0000 mg | ORAL_TABLET | Freq: Every day | ORAL | 1 refills | Status: DC

## 2020-10-03 MED ORDER — DOXYCYCLINE HYCLATE 100 MG PO TABS
100.0000 mg | ORAL_TABLET | Freq: Two times a day (BID) | ORAL | 0 refills | Status: DC
Start: 2020-10-03 — End: 2020-10-22

## 2020-10-03 MED ORDER — FUROSEMIDE 20 MG PO TABS: 20.0000 mg | ORAL_TABLET | Freq: Every day | ORAL | 3 refills | Status: DC | PRN

## 2020-10-03 MED ORDER — BLOOD GLUCOSE MONITOR KIT: PACK | 0 refills | Status: AC

## 2020-10-03 MED FILL — DOXYCYCLINE HYCLATE 100 MG: 100 | 10 days supply | Qty: 20 | Fill #0

## 2020-10-03 MED FILL — ATORVASTATIN CALCIUM 40 MG: 40 | 90 days supply | Qty: 90 | Fill #0

## 2020-10-03 MED FILL — valACYclovir HCL 1 GM TABS: 1 | 15 days supply | Qty: 15 | Fill #0

## 2020-10-03 MED FILL — FREESTYLE LITE METER: W/DEVICE | 30 days supply | Qty: 1 | Fill #0

## 2020-10-03 MED FILL — FUROSEMIDE 20 MG TAB: 20 | 30 days supply | Qty: 30 | Fill #0

## 2020-10-03 MED FILL — BD PEN NDL NANO 32GX5/32: 32G X 4 MM | 90 days supply | Qty: 100 | Fill #0

## 2020-10-03 MED FILL — LISINOPRIL 20 MG TABS: 20 | 90 days supply | Qty: 90 | Fill #0

## 2020-10-03 MED FILL — FREESTYLE LANCETS: 25 days supply | Qty: 100 | Fill #0

## 2020-10-03 MED FILL — FREESTYLE LITE TEST STRIP: 12 days supply | Qty: 50 | Fill #0

## 2020-10-03 NOTE — Progress Notes (Signed)
Subjective:    Patient ID: Catherine Espinoza, female    DOB: January 26, 1976, 45 y.o.   MRN: [id number]  HPI  Patient is a 45 yr old female who presents today for follow up.  HTN- reports non-compliance with medication due to being without insurance.   BP Readings from Last 3 Encounters:  10/03/20 (!) 145/95  03/15/20 (!) 151/97  11/03/19 (!) 164/100   C/o right leg swelling- reports that swelling has been present since November.  She had some lasix on hand and it helped some but she ran out. She also reports that she has a boil at the top of her right leg.    DM2- Denies polyuria.  She has been out of insulin since October. She was using lantus 65 units sq in the AMs.   Lab Results  Component Value Date   HGBA1C 9.6 Repeated and verified X2. (H) 03/15/2020   HGBA1C 14.6 (H) 05/25/2019   HGBA1C 14.9 (H) 06/23/2018   Lab Results  Component Value Date   MICROALBUR 6.6 (H) 06/21/2016   LDLCALC 134 (H) 10/24/2017   CREATININE 0.47 03/15/2020   Depression- not currently taking cymbalta.  She reports that her depression is currently much improved.  She felt that her depression was really related to her previous back pain which has since resolved.  She is very pleased about this.  Wt Readings from Last 3 Encounters:  10/03/20 245 lb (111.1 kg)  03/15/20 200 lb (90.7 kg)  11/03/19 216 lb (98 kg)   Review of Systems See HPI  Past Medical History:  Diagnosis Date  . Anemia, unspecified   . Chronic left-sided thoracic back pain   . Excessive or frequent menstruation   . Morbid obesity ([facility])   . Obstructive sleep apnea 02/11/11   Sleep study: severe OSA- rec CPAP 20cm small  full face mask  . Proteinuria   . Thoracic radiculopathy   . Type II or unspecified type diabetes mellitus without mention of complication, not stated as uncontrolled   . Unspecified essential hypertension      Social History   Socioeconomic History  . Marital status: Single    Spouse name: Not on file  .  Number of children: Not on file  . Years of education: Not on file  . Highest education level: Not on file  Occupational History  . Occupation: cna/med Theme park manager: [employer]  Tobacco Use  . Smoking status: Current Every Day Smoker    Packs/day: 0.50    Years: 10.00    Pack years: 5.00    Types: Cigarettes  . Smokeless tobacco: Never Used  Substance and Sexual Activity  . Alcohol use: Yes    Alcohol/week: 0.0 standard drinks    Comment: occasional use  . Drug use: No  . Sexual activity: Yes    Birth control/protection: Surgical  Other Topics Concern  . 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 ABDOMINAL HYSTERECTOMY    . ABLATION COLPOCLESIS    . CESAREAN SECTION    .  CLEFT PALATE REPAIR    . cyst removal    . ECTOPIC PREGNANCY SURGERY     x 2  . OOPHORECTOMY Right 2002    Family History  Problem Relation Age of Onset  . Heart attack Maternal Aunt   . Diabetes Mother   . Cancer Father        oral cancer    Allergies  Allergen Reactions  . Ibuprofen     REACTION: knots in mouth with SOB  . Labetalol Nausea Only  . Aspirin     Knots in mouth     Current Outpatient Medications on File Prior to Visit  Medication Sig Dispense Refill  . BAYER MICROLET LANCETS lancets Use as instructed to check blood sugar three times daily.  DX  E11.65 (Patient not taking: No sig reported) 100 each 5  . glucose blood (BAYER CONTOUR TEST) test strip Use to check blood sugar 3 times per day.  DX  E11.65 (Patient not taking: No sig reported) 100 each 5  . Insulin Syringe 27G X  1/2" 0.5 ML MISC Use as directed (Patient not taking: Reported on 10/03/2020) 100 each 3   No current facility-administered medications on file prior to visit.    BP (!) 145/95 (BP Location: Right Arm, Patient Position: Sitting, Cuff Size: Large)   Pulse (!) 108   Temp (!) 97 F (36.1 C) (Temporal)   Resp 16   Ht 5\' 5"  (1.651 m)   Wt 245 lb (111.1 kg)   LMP 09/13/2011   SpO2 97%   BMI 40.77 kg/m       Objective:   Physical Exam Constitutional:      Appearance: She is well-developed and well-nourished.  Neck:     Thyroid: No thyromegaly.  Cardiovascular:     Rate and Rhythm: Normal rate and regular rhythm.     Heart sounds: Normal heart sounds. No murmur heard.   Pulmonary:     Effort: Pulmonary effort is normal. No respiratory distress.     Breath sounds: Normal breath sounds. No wheezing.  Musculoskeletal:     Cervical back: Neck supple.  Skin:    General: Skin is warm and dry.  Neurological:     Mental Status: She is alert and oriented to person, place, and time.  Psychiatric:        Mood and Affect: Mood and affect normal.        Behavior: Behavior normal.        Thought Content: Thought content normal.        Judgment: Judgment normal.   MS: Edema noted right leg from ankle to top of thigh.  No significant erythema noted. Skin: Patient has an abscess at the top of the right thigh.  Abscess is draining and has an approximate 1 inch wide area of fluctuance noted.  There is some surrounding induration.  Positive odor noted        Assessment & Plan:  Abscess of the right lower extremity/RLE edema-will initiate doxycycline twice daily x10 days.  I also advised the patient to apply warm compresses twice daily.  I suspect that her lower extremity swelling is likely related to cellulitis, however will obtain a right lower extremity Doppler to rule out DVT.  Plan to bring her back in 2 days for close follow-up.  If needed we will plan I&D of the abscess at that time.  Will add lasix 20mg  once daily. Lab is closed at time of today's visit. We will plan  to obtain labs when she returns on Friday AM.   Diabetes type 2-grossly uncontrolled with A1c of 13.5.  Will restart her Lantus at 65 units daily.  Discussed diabetic diet.  She hopes to qualify for freestyle libre system for glucose monitoring.  I have sent this prescription to her pharmacy.  In the meantime if she is unable to get this I have also given her a prescription for a generic glucometer that she can use in the meantime.  Depression-currently stable without medication.  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  HTN- uncontrolled. Restart lisinopril 20mg  once daily.  This visit occurred during the SARS-CoV-2 public health emergency.  Safety protocols were in place, including screening questions prior to the visit, additional usage of staff PPE, and extensive cleaning of exam room while observing appropriate contact time as indicated for disinfecting solutions.

## 2020-10-03 NOTE — Patient Instructions (Addendum)
Please start doxycyline 100mg  twice daily and warm compresses twice daily for boil.  Complete ultrasound tomorrow when scheduled.  Restart your medications and insulin.  Check sugar twice daily.  Follow up Friday as scheduled.

## 2020-10-04 ENCOUNTER — Ambulatory Visit (HOSPITAL_BASED_OUTPATIENT_CLINIC_OR_DEPARTMENT_OTHER)
Admission: RE | Admit: 2020-10-04 | Discharge: 2020-10-04 | Disposition: A | Discharge status: Home / Self Care | Payer: No Typology Code available for payment source | Source: Ambulatory Visit | Attending: Family | Admitting: Family

## 2020-10-04 ENCOUNTER — Telehealth: Payer: Self-pay

## 2020-10-04 ENCOUNTER — Other Ambulatory Visit: Payer: Self-pay | Admitting: Family

## 2020-10-04 DIAGNOSIS — I83891 Varicose veins of right lower extremities with other complications: Secondary | ICD-10-CM | POA: Insufficient documentation

## 2020-10-04 LAB — POCT GLYCOSYLATED HEMOGLOBIN (HGB A1C): Hemoglobin A1C: 13.5 % — AB (ref 4.0–5.6)

## 2020-10-04 MED ORDER — BASAGLAR KWIKPEN 100 UNIT/ML ~~LOC~~ SOPN: 65.0000 [IU] | PEN_INJECTOR | Freq: Every day | SUBCUTANEOUS | 5 refills | Status: DC

## 2020-10-04 MED FILL — INSULIN GLARGINE-YFGN 100 U: 100 | 47 days supply | Qty: 30 | Fill #0

## 2020-10-04 NOTE — Telephone Encounter (Signed)
Lantus not covered by [employer], preferred alternatives are Hospital doctor, Evaristo Bury, Bydureon, Levemir, Novolog

## 2020-10-04 NOTE — Telephone Encounter (Signed)
Please advise pt that I sent rx for basaglar in place of Lantus. Dose is unchanged.

## 2020-10-04 NOTE — Telephone Encounter (Signed)
Mychart message sent.

## 2020-10-04 NOTE — Telephone Encounter (Signed)
PA initiated via Covermymeds; KEY: [id number]. Awaiting determination.

## 2020-10-05 ENCOUNTER — Telehealth: Payer: Self-pay | Admitting: Family

## 2020-10-05 NOTE — Telephone Encounter (Addendum)
Mrs.Berkovich would like for doctor O'sullian to give her call before she come to her appointment,she refuse to give any detail

## 2020-10-06 ENCOUNTER — Ambulatory Visit: Payer: No Typology Code available for payment source | Admitting: Family

## 2020-10-06 NOTE — Telephone Encounter (Signed)
PA cancelled by plan.

## 2020-10-06 NOTE — Telephone Encounter (Signed)
Spoke to pt. Advised her to reschedule her appointment for early next week. She verbalizes understanding. She had to work today and cancelled her appointment.

## 2020-10-09 ENCOUNTER — Telehealth: Payer: Self-pay | Admitting: Family

## 2020-10-10 ENCOUNTER — Other Ambulatory Visit: Payer: Self-pay | Admitting: Family

## 2020-10-10 ENCOUNTER — Encounter: Payer: Self-pay | Admitting: Family

## 2020-10-10 MED ORDER — FREESTYLE LIBRE 2 READER DEVI: 0 refills | Status: DC

## 2020-10-10 MED ORDER — FREESTYLE LIBRE 2 SENSOR MISC: 12 refills | Status: DC

## 2020-10-10 NOTE — Telephone Encounter (Signed)
Spoke to patient. She states that she is having increased swelling/tightness in the right leg and the swelling is now starting to move up to her lower stomach. I advised her that it sounds like she has a worsening cellulitis that is not responding to oral antibiotics and recommended that she go to the ER now for further evaluation.    Freestyle rx sent per pt request.

## 2020-10-11 ENCOUNTER — Emergency Department (HOSPITAL_BASED_OUTPATIENT_CLINIC_OR_DEPARTMENT_OTHER): Payer: No Typology Code available for payment source

## 2020-10-11 ENCOUNTER — Encounter (HOSPITAL_BASED_OUTPATIENT_CLINIC_OR_DEPARTMENT_OTHER): Payer: Self-pay | Admitting: Emergency Medicine

## 2020-10-11 ENCOUNTER — Other Ambulatory Visit: Payer: Self-pay

## 2020-10-11 ENCOUNTER — Inpatient Hospital Stay (HOSPITAL_BASED_OUTPATIENT_CLINIC_OR_DEPARTMENT_OTHER)
Admission: EM | Admit: 2020-10-11 | Discharge: 2020-10-16 | DRG: 246 | Disposition: A | Discharge status: Home / Self Care | Payer: No Typology Code available for payment source | Attending: Internal Medicine | Admitting: Internal Medicine

## 2020-10-11 DIAGNOSIS — Z9119 Patient's noncompliance with other medical treatment and regimen: Secondary | ICD-10-CM

## 2020-10-11 DIAGNOSIS — I25110 Atherosclerotic heart disease of native coronary artery with unstable angina pectoris: Secondary | ICD-10-CM | POA: Diagnosis present

## 2020-10-11 DIAGNOSIS — Z794 Long term (current) use of insulin: Secondary | ICD-10-CM | POA: Diagnosis not present

## 2020-10-11 DIAGNOSIS — Z888 Allergy status to other drugs, medicaments and biological substances status: Secondary | ICD-10-CM

## 2020-10-11 DIAGNOSIS — Z6841 Body Mass Index (BMI) 40.0 and over, adult: Secondary | ICD-10-CM

## 2020-10-11 DIAGNOSIS — I5043 Acute on chronic combined systolic (congestive) and diastolic (congestive) heart failure: Secondary | ICD-10-CM | POA: Diagnosis present

## 2020-10-11 DIAGNOSIS — Z886 Allergy status to analgesic agent status: Secondary | ICD-10-CM | POA: Diagnosis not present

## 2020-10-11 DIAGNOSIS — I272 Pulmonary hypertension, unspecified: Secondary | ICD-10-CM | POA: Diagnosis present

## 2020-10-11 DIAGNOSIS — E662 Morbid (severe) obesity with alveolar hypoventilation: Secondary | ICD-10-CM | POA: Diagnosis present

## 2020-10-11 DIAGNOSIS — E876 Hypokalemia: Secondary | ICD-10-CM | POA: Diagnosis not present

## 2020-10-11 DIAGNOSIS — I110 Hypertensive heart disease with heart failure: Principal | ICD-10-CM | POA: Diagnosis present

## 2020-10-11 DIAGNOSIS — R52 Pain, unspecified: Secondary | ICD-10-CM | POA: Diagnosis not present

## 2020-10-11 DIAGNOSIS — Z9114 Patient's other noncompliance with medication regimen: Secondary | ICD-10-CM

## 2020-10-11 DIAGNOSIS — I5030 Unspecified diastolic (congestive) heart failure: Secondary | ICD-10-CM | POA: Diagnosis not present

## 2020-10-11 DIAGNOSIS — IMO0002 Reserved for concepts with insufficient information to code with codable children: Secondary | ICD-10-CM | POA: Diagnosis present

## 2020-10-11 DIAGNOSIS — L02419 Cutaneous abscess of limb, unspecified: Secondary | ICD-10-CM | POA: Diagnosis present

## 2020-10-11 DIAGNOSIS — I2510 Atherosclerotic heart disease of native coronary artery without angina pectoris: Secondary | ICD-10-CM | POA: Diagnosis not present

## 2020-10-11 DIAGNOSIS — I509 Heart failure, unspecified: Secondary | ICD-10-CM

## 2020-10-11 DIAGNOSIS — K219 Gastro-esophageal reflux disease without esophagitis: Secondary | ICD-10-CM | POA: Diagnosis present

## 2020-10-11 DIAGNOSIS — I313 Pericardial effusion (noninflammatory): Secondary | ICD-10-CM | POA: Diagnosis present

## 2020-10-11 DIAGNOSIS — I10 Essential (primary) hypertension: Secondary | ICD-10-CM | POA: Diagnosis present

## 2020-10-11 DIAGNOSIS — E11641 Type 2 diabetes mellitus with hypoglycemia with coma: Secondary | ICD-10-CM | POA: Diagnosis not present

## 2020-10-11 DIAGNOSIS — I428 Other cardiomyopathies: Secondary | ICD-10-CM | POA: Diagnosis present

## 2020-10-11 DIAGNOSIS — E1165 Type 2 diabetes mellitus with hyperglycemia: Secondary | ICD-10-CM | POA: Diagnosis present

## 2020-10-11 DIAGNOSIS — Z833 Family history of diabetes mellitus: Secondary | ICD-10-CM

## 2020-10-11 DIAGNOSIS — E11649 Type 2 diabetes mellitus with hypoglycemia without coma: Secondary | ICD-10-CM | POA: Diagnosis not present

## 2020-10-11 DIAGNOSIS — G4733 Obstructive sleep apnea (adult) (pediatric): Secondary | ICD-10-CM | POA: Diagnosis not present

## 2020-10-11 DIAGNOSIS — E785 Hyperlipidemia, unspecified: Secondary | ICD-10-CM | POA: Diagnosis present

## 2020-10-11 DIAGNOSIS — E118 Type 2 diabetes mellitus with unspecified complications: Secondary | ICD-10-CM | POA: Diagnosis present

## 2020-10-11 DIAGNOSIS — M7989 Other specified soft tissue disorders: Secondary | ICD-10-CM

## 2020-10-11 DIAGNOSIS — F17200 Nicotine dependence, unspecified, uncomplicated: Secondary | ICD-10-CM | POA: Diagnosis present

## 2020-10-11 DIAGNOSIS — E1151 Type 2 diabetes mellitus with diabetic peripheral angiopathy without gangrene: Secondary | ICD-10-CM | POA: Diagnosis present

## 2020-10-11 DIAGNOSIS — Z20822 Contact with and (suspected) exposure to covid-19: Secondary | ICD-10-CM | POA: Diagnosis present

## 2020-10-11 DIAGNOSIS — L732 Hidradenitis suppurativa: Secondary | ICD-10-CM | POA: Diagnosis present

## 2020-10-11 DIAGNOSIS — Z599 Problem related to housing and economic circumstances, unspecified: Secondary | ICD-10-CM

## 2020-10-11 DIAGNOSIS — Z8249 Family history of ischemic heart disease and other diseases of the circulatory system: Secondary | ICD-10-CM

## 2020-10-11 DIAGNOSIS — I5021 Acute systolic (congestive) heart failure: Secondary | ICD-10-CM | POA: Diagnosis not present

## 2020-10-11 DIAGNOSIS — F17210 Nicotine dependence, cigarettes, uncomplicated: Secondary | ICD-10-CM | POA: Diagnosis present

## 2020-10-11 DIAGNOSIS — J9601 Acute respiratory failure with hypoxia: Secondary | ICD-10-CM | POA: Diagnosis not present

## 2020-10-11 LAB — BASIC METABOLIC PANEL
Anion gap: 8 (ref 5–15)
BUN: 9 mg/dL (ref 6–20)
CO2: 25 mmol/L (ref 22–32)
Calcium: 8.7 mg/dL — ABNORMAL LOW (ref 8.9–10.3)
Chloride: 100 mmol/L (ref 98–111)
Creatinine, Ser: 0.6 mg/dL (ref 0.44–1.00)
GFR, Estimated: >60 mL/min (ref >60)
Glucose, Bld: 334 mg/dL — ABNORMAL HIGH (ref 70–99)
Potassium: 3.8 mmol/L (ref 3.5–5.1)
Sodium: 133 mmol/L — ABNORMAL LOW (ref 135–145)

## 2020-10-11 LAB — CBC WITH DIFFERENTIAL/PLATELET
Abs Immature Granulocytes: 0.05 10*3/uL (ref 0.00–0.07)
Basophils Absolute: 0.1 10*3/uL (ref 0.0–0.1)
Basophils Relative: 0 %
Eosinophils Absolute: 0.1 10*3/uL (ref 0.0–0.5)
Eosinophils Relative: 1 %
HCT: 43.6 % (ref 36.0–46.0)
Hemoglobin: 14.3 g/dL (ref 12.0–15.0)
Immature Granulocytes: 0 %
Lymphocytes Relative: 23 %
Lymphs Abs: 2.7 10*3/uL (ref 0.7–4.0)
MCH: 29.7 pg (ref 26.0–34.0)
MCHC: 32.8 g/dL (ref 30.0–36.0)
MCV: 90.6 fL (ref 80.0–100.0)
Monocytes Absolute: 0.8 10*3/uL (ref 0.1–1.0)
Monocytes Relative: 7 %
Neutro Abs: 8.1 10*3/uL — ABNORMAL HIGH (ref 1.7–7.7)
Neutrophils Relative %: 69 %
Platelets: 235 10*3/uL (ref 150–400)
RBC: 4.81 MIL/uL (ref 3.87–5.11)
RDW: 13.4 % (ref 11.5–15.5)
WBC: 11.8 10*3/uL — ABNORMAL HIGH (ref 4.0–10.5)
nRBC: 0 % (ref 0.0–0.2)

## 2020-10-11 LAB — RESP PANEL BY RT-PCR (FLU A&B, COVID) ARPGX2
Influenza A by PCR: NEGATIVE
Influenza B by PCR: NEGATIVE
SARS Coronavirus 2 by RT PCR: NEGATIVE

## 2020-10-11 LAB — TROPONIN I (HIGH SENSITIVITY)
Troponin I (High Sensitivity): 18 ng/L — ABNORMAL HIGH (ref <18)
Troponin I (High Sensitivity): 18 ng/L — ABNORMAL HIGH (ref <18)

## 2020-10-11 LAB — D-DIMER, QUANTITATIVE: D-Dimer, Quant: 0.88 ug/mL-FEU — ABNORMAL HIGH (ref 0.00–0.50)

## 2020-10-11 LAB — GLUCOSE, CAPILLARY: Glucose-Capillary: 179 mg/dL — ABNORMAL HIGH (ref 70–99)

## 2020-10-11 LAB — CBG MONITORING, ED
Glucose-Capillary: 322 mg/dL — ABNORMAL HIGH (ref 70–99)
Glucose-Capillary: 188 mg/dL — ABNORMAL HIGH (ref 70–99)

## 2020-10-11 LAB — BRAIN NATRIURETIC PEPTIDE: B Natriuretic Peptide: 501.2 pg/mL — ABNORMAL HIGH (ref 0.0–100.0)

## 2020-10-11 MED ORDER — FUROSEMIDE 10 MG/ML IJ SOLN
40.0000 mg | Freq: Once | INTRAMUSCULAR | Status: AC
  Administered 2020-10-11: 40 mg via INTRAVENOUS
  Filled 2020-10-11: qty 4

## 2020-10-11 MED ORDER — ACETAMINOPHEN 325 MG PO TABS
650.0000 mg | ORAL_TABLET | Freq: Four times a day (QID) | ORAL | Status: DC | PRN
  Administered 2020-10-11 – 2020-10-12 (×2): 650 mg via ORAL
  Filled 2020-10-11 (×2): qty 2

## 2020-10-11 MED ORDER — INSULIN ASPART 100 UNIT/ML ~~LOC~~ SOLN
10.0000 [IU] | Freq: Once | SUBCUTANEOUS | Status: AC
  Administered 2020-10-11: 10 [IU] via SUBCUTANEOUS
  Filled 2020-10-11: qty 10

## 2020-10-11 MED ORDER — NITROGLYCERIN 2 % TD OINT
0.5000 [in_us] | TOPICAL_OINTMENT | Freq: Once | TRANSDERMAL | Status: AC
  Administered 2020-10-11: 0.5 [in_us] via TOPICAL
  Filled 2020-10-11: qty 1

## 2020-10-11 MED ORDER — BUPIVACAINE HCL (PF) 0.5 % IJ SOLN
10.0000 mL | Freq: Once | INTRAMUSCULAR | Status: DC
  Filled 2020-10-11: qty 10

## 2020-10-11 MED ORDER — MORPHINE SULFATE (PF) 4 MG/ML IV SOLN
4.0000 mg | Freq: Once | INTRAVENOUS | Status: AC
  Administered 2020-10-11: 4 mg via INTRAVENOUS
  Filled 2020-10-11: qty 1

## 2020-10-11 MED ORDER — IOHEXOL 350 MG/ML SOLN
100.0000 mL | Freq: Once | INTRAVENOUS | Status: AC | PRN
  Administered 2020-10-11: 100 mL via INTRAVENOUS

## 2020-10-11 MED ORDER — ACETAMINOPHEN 325 MG PO TABS
650.0000 mg | ORAL_TABLET | Freq: Once | ORAL | Status: AC
  Administered 2020-10-11: 650 mg via ORAL
  Filled 2020-10-11: qty 2

## 2020-10-11 MED ORDER — LISINOPRIL 10 MG PO TABS
20.0000 mg | ORAL_TABLET | Freq: Once | ORAL | Status: AC
  Administered 2020-10-11: 20 mg via ORAL
  Filled 2020-10-11: qty 2

## 2020-10-11 MED ORDER — INSULIN REGULAR HUMAN 100 UNIT/ML IJ SOLN
10.0000 [IU] | Freq: Once | INTRAMUSCULAR | Status: DC
  Filled 2020-10-11: qty 3

## 2020-10-11 MED FILL — FREESTYLE LIBRE 2 SENSOR MI: 28 days supply | Qty: 2 | Fill #0

## 2020-10-11 MED FILL — FREESTYLE LIBRE 2 READER SY: 30 days supply | Qty: 1 | Fill #0

## 2020-10-11 NOTE — ED Notes (Signed)
Pt hand off to [facility] with [facility]. With upon arrival. Pt updated about ETA

## 2020-10-11 NOTE — ED Notes (Signed)
Intermittently desats to 88 percent with good wave form, pt placed on 2L O2/[place]. PA notified

## 2020-10-11 NOTE — ED Triage Notes (Signed)
Reports being seen upstairs for swelling and pain in lower ext.  DVT study negative.  Was sent here to be treated for cellulitis.  Started on doxy a week ago.

## 2020-10-11 NOTE — ED Notes (Signed)
Fluid challenge completed, passed

## 2020-10-11 NOTE — ED Notes (Addendum)
Pt. Ambulated around floor, first lap Pt. Stayed at 91-92%, second lap Pt dropped to 86%, once back in the room Pt. Dropped to 83% but after a couple mins Pt. Returned to 90%, Therapist, sports & PA  informed.

## 2020-10-11 NOTE — ED Notes (Signed)
Pt, ambulated to the bathroom, SpO2 dropped to 84%  but returned to 90%, RN was informed

## 2020-10-11 NOTE — Consult Note (Addendum)
Cardiology Consultation:   Patient ID: Catherine Espinoza; [id number]; [birth year]   Admit date: 10/11/2020 Date of Consult: 10/12/2020  Primary Care Provider: [name], NP Primary Cardiologist: New to [facility]- Dr. Stanford Breed remotely in 2012)  Patient Profile:   Catherine Espinoza is a 45 y.o. female with a hx of obesity, hypertension, sleep apnea, poorly controlled DM2 and tobacco abuse who is being seen today for the evaluation of CHF/RV failure at the request of Dr. Hal Hope.  History of Present Illness:   Catherine Espinoza is a 45 year old female with a history of stated above who presented to [facility] 10/11/2020 with leg pain and swelling.  She was seen by her PCP and has been followed for the evaluation of cellulitis.  On chart review, it appears that she called the office 10/10/2020 with increased swelling and tightness in her right leg with swelling extending into her lower stomach felt not to be responding to oral antibiotics.  Given this, she was sent to the ED for further evaluation.  Lower extremity ultrasound performed which was negative for DVT.  During her ED stay, she was noted to be intermittently hypoxic with SPO2 levels dropping to the high 80s therefore she was placed on supplemental O2.  CTA performed which showed no PE however, small pericardial effusion with mild contrast reflux to the IVC and hepatic veins indicating possible right heart failure.  Also found to have moderate patchy groundglass opacities throughout both lung fields felt to be compatible with pulmonary edema.  There was also a dilated main pulmonary artery indicating possible pulmonary arterial hypertension.  She was given IV Lasix 40 mg along with lisinopril 20 and nitroglycerin paste.  BNP found to be elevated at 501.  HST at 18 with a repeat at 19.  Creatinine stable at 0.60.  Mild leukocytosis at 11.8.  Hemoglobin A1c at 13.5.  She was remotely seen by Dr. Stanford Breed in 2012 for the evaluation of  multiple risk factors for CVA along with dyspnea.  At that time, stress echocardiogram was ordered however the patient was a no-show for the study.  Her dyspnea was felt to likely be secondary to obesity hypoventilation syndrome.  She was rescheduled for stress echocardiogram to exclude ischemia and quantify LV function.  She was also scheduled for sleep apnea study as well.   Stress echocardiogram performed 02/01/2011 which showed no stress arrhythmias found to be a low risk study.  She has not been seen since this time.  At the time of this evaluation she notes some improvement in her LE edema, though legs still feel tight (R>L). She reports RLE swelling has been ongoing for the past couple weeks. She also has intermittent swelling in bilateral feet. In the past this has responded well to po lasix, however she was uninsured up until the start of 2022 and only just got started back on her current medications 1 week ago. She denies any SOB at rest or DOE. She has had trouble with orthopnea for a couple months. She has not been on a CPAP for a couple years due to insurance trouble. She reports losing 100lbs in the past year which has improved her snoring significantly and she no longer feels daytime somnolence. She denies PND. No complaints of fever, chills, sweats, dizziness, lightheadedness, syncope, palpitations, abdominal bloating, or GI upset. She has a right groin abscess which has been improving with po antibiotics. She denies significant ETOH use though does smoke 1 ppd for the past 27 years.  She has never attempted to quit in the past.   Past Medical History:  Diagnosis Date  . Anemia, unspecified   . Chronic left-sided thoracic back pain   . Excessive or frequent menstruation   . Morbid obesity ([place])   . Obstructive sleep apnea 02/11/11   Sleep study: severe OSA- rec CPAP 20cm small  full face mask  . Proteinuria   . Thoracic radiculopathy   . Type II or unspecified type diabetes mellitus  without mention of complication, not stated as uncontrolled   . Unspecified essential hypertension     Past Surgical History:  Procedure Laterality Date  . ABDOMINAL HYSTERECTOMY    . ABLATION COLPOCLESIS    . CESAREAN SECTION    . CLEFT PALATE REPAIR    . cyst removal    . ECTOPIC PREGNANCY SURGERY     x 2  . OOPHORECTOMY Right 2002     Prior to Admission medications   Medication Sig Start Date End Date Taking? Authorizing Provider  atorvastatin (LIPITOR) 40 MG tablet Take 1 tablet (40 mg total) by mouth daily. 10/03/20   [name], NP  BAYER MICROLET LANCETS lancets Use as instructed to check blood sugar three times daily.  DX  E11.65 Patient not taking: No sig reported 10/24/17   [name], NP  blood glucose meter kit and supplies KIT Dispense based on patient and insurance preference. Use up to four times daily as directed. (FOR ICD-9 250.00, 250.01). 10/03/20   [name], NP  Continuous Blood Gluc Receiver (FREESTYLE LIBRE 2 READER) DEVI Use as directed 10/10/20   [name], NP  Continuous Blood Gluc Sensor (FREESTYLE LIBRE 2 SENSOR) MISC Use as directed 10/10/20   [name], NP  Continuous Blood Gluc Sensor ([place]) MISC Apply every 14 days. 10/03/20   [name], NP  doxycycline (VIBRA-TABS) 100 MG tablet Take 1 tablet (100 mg total) by mouth 2 (two) times daily. 10/03/20   [name], NP  furosemide (LASIX) 20 MG tablet Take 1 tablet (20 mg total) by mouth daily as needed. 10/03/20   [name], NP  glucose blood (BAYER CONTOUR TEST) test strip Use to check blood sugar 3 times per day.  DX  E11.65 Patient not taking: No sig reported 10/24/17   [name], NP  Insulin Glargine (BASAGLAR KWIKPEN) 100 UNIT/ML Inject 65 Units into the skin daily. 10/04/20   [name], NP  Insulin Syringe 27G X 1/2" 0.5 ML MISC Use as directed Patient not taking: Reported on 10/03/2020 03/16/20    [name], NP  lisinopril (ZESTRIL) 20 MG tablet Take 1 tablet (20 mg total) by mouth daily. 10/03/20   [name], NP  valACYclovir (VALTREX) 1000 MG tablet Take 1 tablet by mouth once daily for 5 days at start of rash 10/03/20   [name], NP    Inpatient Medications: Scheduled Meds: . atorvastatin  40 mg Oral Daily  . doxycycline  100 mg Oral Q12H  . enoxaparin (LOVENOX) injection  40 mg Subcutaneous Q24H  . furosemide  40 mg Intravenous BID  . insulin aspart  0-9 Units Subcutaneous TID WC  . insulin aspart  6 Units Subcutaneous TID WC  . insulin glargine  40 Units Subcutaneous Daily  . lisinopril  20 mg Oral Daily  . potassium chloride  30 mEq Oral Once   Continuous Infusions: . magnesium sulfate bolus IVPB     PRN Meds:   Allergies:    Allergies  Allergen Reactions  .  Ibuprofen     REACTION: knots in mouth with SOB  . Labetalol Nausea Only  . Aspirin     Knots in mouth     Social History:   Social History   Socioeconomic History  . Marital status: Single    Spouse name: Not on file  . Number of children: Not on file  . Years of education: Not on file  . Highest education level: Not on file  Occupational History  . Occupation: cna/med Engineer, production: [employer]  Tobacco Use  . Smoking status: Current Every Day Smoker    Packs/day: 0.50    Years: 10.00    Pack years: 5.00    Types: Cigarettes  . Smokeless tobacco: Never Used  Substance and Sexual Activity  . Alcohol use: Yes    Alcohol/week: 0.0 standard drinks    Comment: occasional use  . Drug use: No  . Sexual activity: Yes    Birth control/protection: Surgical  Other Topics Concern  . 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 Heart attack Maternal Aunt   . Diabetes Mother   . Cancer Father        oral cancer   Family Status:  Family Status  Relation Name Status  . Mat Aunt  Deceased  . Mother  (Not Specified)       DM2-(s/p apncreatectomy)had gunshot wound  . Father  (Not Specified)    ROS:  Please see the history of present illness.  All other ROS reviewed and negative.     Physical Exam/Data:   Vitals:   10/11/20 2208 10/11/20 2359 10/12/20 0613 10/12/20 0825  BP: (!) 159/101 127/86 (!) 139/98 (!) 133/96  Pulse: 97 84 94 98  Resp: '18 18 16 18  ' Temp: 99.1 F (37.3 C) 98.2 F (36.8 C) 98.1 F (36.7 C) 98.2 F (36.8 C)  TempSrc: Oral Oral Oral Oral  SpO2: 99% 99% 93% 96%  Weight:   112 kg   Height:        Intake/Output Summary (Last 24 hours) at 10/12/2020 0843 Last data filed at 10/12/2020 0616 Gross per 24 hour  Intake 240 ml  Output 1200 ml  Net -960 ml   Filed Weights   10/11/20 0916 10/11/20 2155 10/12/20 0613  Weight: 108.9 kg 113.3 kg 112 kg   Body mass index is 41.1 kg/m.   General:  Well nourished, well developed, in no acute distress HEENT: sclera anicteric Neck: no JVD Vascular: No carotid bruits; 4/4 extremity pulses 2+, without bruits  Cardiac:  normal S1, S2; RRR; no murmurs, rubs, or gallops Lungs:  clear to auscultation bilaterally, no wheezing, rhonchi or rales  Abd: soft, obese, nontender, no hepatomegaly  Ext: 1-2+ LE edema (R>L) Musculoskeletal:  No deformities, BUE and BLE strength normal and equal Skin: warm and dry  Neuro:  CNs 2-12 intact, no focal abnormalities noted Psych:  Normal affect   EKG:  The EKG was personally reviewed and demonstrates:  Sinus tachycardia with rate 107  bpm, baseline wander, non-specific T wave abnormalities, QTc 501, no STE/D Telemetry:  Telemetry was  personally reviewed and demonstrates:  Sinus rhythm  Relevant CV Studies:  ECHO: pending  CATH: None  Laboratory Data:  Chemistry Recent Labs  Lab 10/11/20 1128 10/12/20 0145  NA 133* 136  K 3.8 3.7  CL 100 101  CO2 25 26  GLUCOSE 334* 189*  BUN 9 8  CREATININE 0.60 0.67  CALCIUM 8.7* 8.3*  GFRNONAA >60 >60  ANIONGAP 8 9    Total Protein  Date Value Ref Range Status  10/12/2020 5.9 (L) 6.5 - 8.1 g/dL Final   Albumin  Date Value Ref Range Status  10/12/2020 2.0 (L) 3.5 - 5.0 g/dL Final   AST  Date Value Ref Range Status  10/12/2020 11 (L) 15 - 41 U/L Final   ALT  Date Value Ref Range Status  10/12/2020 20 0 - 44 U/L Final   Alkaline Phosphatase  Date Value Ref Range Status  10/12/2020 84 38 - 126 U/L Final   Total Bilirubin  Date Value Ref Range Status  10/12/2020 0.5 0.3 - 1.2 mg/dL Final   Hematology Recent Labs  Lab 10/11/20 1128 10/12/20 0145  WBC 11.8* 10.0  RBC 4.81 4.48  HGB 14.3 13.4  HCT 43.6 40.8  MCV 90.6 91.1  MCH 29.7 29.9  MCHC 32.8 32.8  RDW 13.4 13.7  PLT 235 214   Cardiac EnzymesNo results for input(s): TROPONINI in the last 168 hours. No results for input(s): TROPIPOC in the last 168 hours.  BNP Recent Labs  Lab 10/11/20 1128  BNP 501.2*    DDimer  Recent Labs  Lab 10/11/20 1244  DDIMER 0.88*   TSH:  Lab Results  Component Value Date   TSH 1.477 10/12/2020   Lipids: Lab Results  Component Value Date   CHOL 195 10/24/2017   HDL 41.10 10/24/2017   LDLCALC 134 (H) 10/24/2017   LDLDIRECT 164.9 08/11/2014   TRIG 100.0 10/24/2017   CHOLHDL 5 10/24/2017   HgbA1c: Lab Results  Component Value Date   HGBA1C 13.5 (A) 10/04/2020    Radiology/Studies:  CT Angio Chest PE W/Cm &/Or Wo Cm  Result Date: 10/11/2020 CLINICAL DATA:  Elevated D-dimer. CHF of new onset with dyspnea. Bilateral lower extremity swelling and pain. EXAM: CT ANGIOGRAPHY CHEST WITH CONTRAST TECHNIQUE: Multidetector CT imaging of the chest was  performed using the standard protocol during bolus administration of intravenous contrast. Multiplanar CT image reconstructions and MIPs were obtained to evaluate the vascular anatomy. CONTRAST:  136m OMNIPAQUE IOHEXOL 350 MG/ML SOLN COMPARISON:  05/28/2019 chest CT. FINDINGS: Cardiovascular: The study is high quality for the evaluation of pulmonary embolism. There are no filling defects in the central, lobar, segmental or subsegmental pulmonary artery branches to suggest acute pulmonary embolism. Atherosclerotic nonaneurysmal thoracic aorta. Dilated main pulmonary artery (3.6 cm diameter). Borderline mild cardiomegaly. Small pericardial effusion/thickening, slightly increased. Left anterior descending coronary atherosclerosis. Mediastinum/Nodes: No discrete thyroid nodules. Unremarkable esophagus. Mild right axillary adenopathy up to 1.1 cm short axis diameter (series 7/image 39), not appreciably changed. Stable mild left supraclavicular adenopathy up to 1.1 cm (series 7/image 8). Mild right paratracheal adenopathy up to 1.6 cm (series 7/image 23), stable. No pathologically enlarged hilar nodes. Lungs/Pleura: No pneumothorax. Small dependent bilateral pleural effusions, right greater than left. Moderate patchy ground-glass opacities throughout both lungs with worsened interlobular septal thickening throughout both lungs. No lung masses or significant pulmonary nodules. Upper abdomen: Mild contrast reflux into the IVC and hepatic  veins. Musculoskeletal: No aggressive appearing focal osseous lesions. Mild thoracic spondylosis. Mild anasarca. Review of the MIP images confirms the above findings. IMPRESSION: 1. No pulmonary embolism. 2. Borderline mild cardiomegaly. Small pericardial effusion/thickening, slightly increased. Mild contrast reflux into the IVC and hepatic veins, which may indicate right heart failure. 3. Moderate patchy ground-glass opacities throughout both lungs with worsened interlobular septal  thickening throughout both lungs, most compatible with cardiogenic pulmonary edema. 4. Small dependent bilateral pleural effusions, right greater than left. 5. Dilated main pulmonary artery, suggesting pulmonary arterial hypertension. 6. Chronic mild right axillary, left supraclavicular and mediastinal lymphadenopathy, stable, presumably reactive. 7. Aortic Atherosclerosis (ICD10-I70.0). Electronically Signed   By: Ilona Sorrel M.D.   On: 10/11/2020 14:35   DG Chest Port 1 View  Result Date: 10/11/2020 CLINICAL DATA:  Bilateral lower extremity pain and swelling. EXAM: PORTABLE CHEST 1 VIEW COMPARISON:  PA and lateral chest 05/09/2018. FINDINGS: The cardiopericardial silhouette is enlarged. There is diffuse hazy bilateral airspace disease. No pneumothorax or pleural effusion. IMPRESSION: Enlarged cardiopericardial silhouette compatible with cardiomegaly and/or pericardial effusion. Diffuse hazy airspace opacity likely due to pulmonary edema. Electronically Signed   By: Inge Rise M.D.   On: 10/11/2020 11:24    Assessment and Plan:   1. 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 Stanford Breed in 2012 further evaluation of dyspnea at which time she underwent a dobutamine stress test which was found to be normal.  She has not been seen by our service since that time.  She has recently been followed in the outpatient setting by her PCP for the evaluation of lower extremity edema and cellulitis treated with doxycycline x1 week.  Apparently, she called her PCP 10/11/20 in regards to increased swelling, right greater than left lower extremity at which time she was sent to the ED for further evaluation.  Lower extremity Doppler study was negative for DVT.  CTA showed no PE however showed mild cardiomegaly, small pericardial effusion with mild contrast reflux into the IVC and hepatic vein which may indicate right heart failure.   There was moderate patchy groundglass opacities throughout both lung fields most compatible with pulmonary edema.  There was a dilated main pulmonary artery suggestive of pulmonary arterial hypertension. BNP elevated at 501. HST at 18>> 18, flat trend not consistent with ACS.  Elevation likely secondary to fluid volume overload. She was started on IV Lasix 40 mg BID with UOP -[facility] 249[facility] last night, down to 247lbs today.  - Continue IV lasix BID for now - Will follow-up on the echocardiogram for full RV and LV assessment - Continue to monitor electrolytes closely and replete as needed to maintain K >4, Mg >2 - Continue to monitor daily weights and strict I&Os  2.  Hypoxia with respiratory insufficiency: Initially stable on room air however began having desaturations into the high 80% range therefore placed on supplemental O2. Currently stable in the 90s on intermittent O2 via [place]. Has a history of sleep apnea and likely obesity hypoventilation syndrome - Continue with IV diuretics for now and monitor respiratory status closely  3.  Uncontrolled DM2: Hemoglobin A1c found to be markedly elevated at 13.5. Admission glucose at 334 - Continue ISS per primary team - Consider DM coordinator consultation  4.  HTN: BP poorly controlled this admission on home lisinopril 20 mg. She was just restarted on lisinopril ~1 week ago after being off medications  for several months - Will increase her lisinopril to 54m daily  5.  HLD: Last LDL, 134 on 10/24/2017; on atorvastatin 40 mg daily at home - Continue statin  6.  Morbid obesity: BMI at 39.9; Weight 249lbs on admission - Continue to encourage healthy dietary/lifestyle modifications to promote weight loss  7. OSA: diagnosed on multiple sleep studies in the past. She has not had a CPAP machine for several years. She does report losing 100lbs in the past year and no longer snores or has daytime somnolence. Still with  orthopnea. On review of weights over the past couple years per outpatient notes, there have been no significant changes in weight.  - Would benefit from a repeat sleep study outpatient  8. RLE swelling/right groin abscess: she has been on po doxycycline with improvement in her     For questions or updates, please contact CGulf BreezePlease consult [email or web address] for contact info under Cardiology/STEMI.   Signed, KAbigail Butts PA-C [facility] 10/12/2020 8:43 AM As above, patient seen and examined.  Briefly she is a 45year old female with past medical history of hypertension, sleep apnea, diabetes mellitus, tobacco abuse for evaluation of acute heart failure.  Patient states that since November she has had mild dyspnea on exertion with vigorous activities.  She notices orthopnea, PND and bilateral lower extremity edema (initially in the right lower extremity but ultimately also in the left).  She denies chest pain, fevers or chills.  She states she has lost weight recently.  She was admitted and cardiology asked to evaluate.  Physical exam shows diminished breath sounds in the bases.  She has 1-2+ bilateral lower extremity edema.  Chest CT shows no pulmonary embolus.  The pulmonary artery is enlarged suggesting pulmonary hypertension.  Small pericardial effusion.  There is also note of pulmonary edema and pleural effusions.  BUN 8 and creatinine 0.67.  Albumin 2.0.  BNP 501.  TSH 1.477.  Electrocardiogram shows sinus rhythm, right axis deviation and nonspecific ST changes.  1 acute CHF-LV function unknown.  Plan echocardiogram to assess.  Continue Lasix 40 mg IV twice daily and follow renal function.  If LV function decreased will ultimately need Entresto and beta-blocker.  2 hypertension-patient's blood pressure is elevated.  Will transition to losartan 100 mg daily and adjust regimen as needed.  3 dilated pulmonary artery-consistent with pulmonary hypertension.  Likely secondary to obesity  hypoventilation syndrome, obstructive sleep apnea and diastolic congestive heart failure.  4 tobacco abuse-patient counseled on discontinuing.  5 probable obstructive sleep apnea-we will need outpatient referral for management after discharge.  6 hyperlipidemia-continue statin.  7 diabetes mellitus-Per primary care.  BKirk Ruths MD

## 2020-10-11 NOTE — ED Provider Notes (Addendum)
[facility] EMERGENCY DEPARTMENT Provider Note   CSN: [id number] Arrival date & time: 10/11/20  5183     History Chief Complaint  Patient presents with  . leg pain and swelling    Catherine Espinoza is a 45 y.o. female.  HPI  Patient is a 44 year old female with past medical history significant for anemia, morbid obesity with sleep apnea, DM 2 that is poorly controlled, HTN, hidradenitis suppurativa  She is presented to the emergency room today with complaints of right lower extremity swelling and pain.  She states that her legs have both been swelling significantly since November.  She was started on Lasix 1 week ago by her primary care doctor who was given 20 mg as needed.  She states that she has had 2 or 3 doses.  She states that she is currently on doxycycline.  She has a hidradenitis suppurativa history and states that she has draining abscesses frequently from her armpits and groin area.  She has 1 presently in her right armpit and right groin area.  She states that she was told she did not need incision and drainage given that they are presently draining.  She states there is still draining.  She states that the swelling in her legs is much worse.  She is uncertain about weight changes.  She has no history of heart failure states that she has felt somewhat short of breath over the past several months seems to be worse with exertion.  She denies any chest pain lightheadedness or dizziness.  She states that she has been noncompliant with her blood pressure medicines and other medicines for a couple years prior to reestablishing health insurance and having follow-up within the past year.  She denies any fevers or chills.  She denies any history of DVT or PE.  She is not a smoker.  She does have a history of obstructive sleep apnea  Patient was seen by her primary care doctor 1/6 and placed on doxycycline at that time she also had a negative DVT study of the right lower extremity to  rule out blood clot.    Past Medical History:  Diagnosis Date  . Anemia, unspecified   . Chronic left-sided thoracic back pain   . Excessive or frequent menstruation   . Morbid obesity ([place])   . Obstructive sleep apnea 02/11/11   Sleep study: severe OSA- rec CPAP 20cm small  full face mask  . Proteinuria   . Thoracic radiculopathy   . Type II or unspecified type diabetes mellitus without mention of complication, not stated as uncontrolled   . Unspecified essential hypertension     Patient Active Problem List   Diagnosis Date Noted  . CHF, acute ([place]) 10/11/2020  . Preventative health care 06/21/2016  . GERD (gastroesophageal reflux disease) 12/25/2014  . Onychomycosis 08/11/2014  . HTN (hypertension) 10/11/2013  . Hidradenitis suppurativa 08/02/2011  . Fatigue 08/02/2011  . Obstructive sleep apnea 02/15/2011  . Hypertension 02/15/2011  . Hyperlipidemia 01/16/2011  . PROTEINURIA 11/16/2009  . Diabetes type 2, uncontrolled ([place]) 11/03/2009  . ANEMIA 11/03/2009  . MORBID OBESITY 10/31/2009  . TOBACCO ABUSE 10/31/2009    Past Surgical History:  Procedure Laterality Date  . ABDOMINAL HYSTERECTOMY    . ABLATION COLPOCLESIS    . CESAREAN SECTION    . CLEFT PALATE REPAIR    . cyst removal    . ECTOPIC PREGNANCY SURGERY     x 2  . OOPHORECTOMY Right 2002  OB History   No obstetric history on file.     Family History  Problem Relation Age of Onset  . Heart attack Maternal Aunt   . Diabetes Mother   . Cancer Father        oral cancer    Social History   Tobacco Use  . Smoking status: Current Every Day Smoker    Packs/day: 0.50    Years: 10.00    Pack years: 5.00    Types: Cigarettes  . Smokeless tobacco: Never Used  Substance Use Topics  . Alcohol use: Yes    Alcohol/week: 0.0 standard drinks    Comment: occasional use  . Drug use: No    Home Medications Prior to Admission medications   Medication Sig Start Date End Date Taking? Authorizing  Provider  atorvastatin (LIPITOR) 40 MG tablet Take 1 tablet (40 mg total) by mouth daily. 10/03/20   [name], NP  BAYER MICROLET LANCETS lancets Use as instructed to check blood sugar three times daily.  DX  E11.65 Patient not taking: No sig reported 10/24/17   [name], NP  blood glucose meter kit and supplies KIT Dispense based on patient and insurance preference. Use up to four times daily as directed. (FOR ICD-9 250.00, 250.01). 10/03/20   [name], NP  Continuous Blood Gluc Receiver (FREESTYLE LIBRE 2 READER) DEVI Use as directed 10/10/20   [name], NP  Continuous Blood Gluc Sensor (FREESTYLE LIBRE 2 SENSOR) MISC Use as directed 10/10/20   [name], NP  Continuous Blood Gluc Sensor ([place]) MISC Apply every 14 days. 10/03/20   [name], NP  doxycycline (VIBRA-TABS) 100 MG tablet Take 1 tablet (100 mg total) by mouth 2 (two) times daily. 10/03/20   [name], NP  furosemide (LASIX) 20 MG tablet Take 1 tablet (20 mg total) by mouth daily as needed. 10/03/20   [name], NP  glucose blood (BAYER CONTOUR TEST) test strip Use to check blood sugar 3 times per day.  DX  E11.65 Patient not taking: No sig reported 10/24/17   [name], NP  Insulin Glargine (BASAGLAR KWIKPEN) 100 UNIT/ML Inject 65 Units into the skin daily. 10/04/20   [name], NP  Insulin Syringe 27G X 1/2" 0.5 ML MISC Use as directed Patient not taking: Reported on 10/03/2020 03/16/20   [name], NP  lisinopril (ZESTRIL) 20 MG tablet Take 1 tablet (20 mg total) by mouth daily. 10/03/20   [name], NP  valACYclovir (VALTREX) 1000 MG tablet Take 1 tablet by mouth once daily for 5 days at start of rash 10/03/20   [name], NP    Allergies    Ibuprofen, Labetalol, and Aspirin  Review of Systems   Review of Systems  Constitutional: Positive for fatigue. Negative for chills and fever.  HENT:  Negative for congestion.   Eyes: Negative for pain.  Respiratory: Positive for shortness of breath. Negative for cough.   Cardiovascular: Positive for leg swelling. Negative for chest pain.  Gastrointestinal: Negative for abdominal pain, diarrhea, nausea and vomiting.  Genitourinary: Negative for dysuria.  Musculoskeletal: Negative for myalgias.  Skin: Negative for rash.  Neurological: Negative for dizziness and headaches.    Physical Exam Updated Vital Signs BP 128/89 (BP Location: Right Arm)   Pulse (!) 104   Temp 99 F (37.2 C) (Oral)   Resp 18   Ht '5\' 5"'  (1.651 m)   Wt 113.3 kg Comment: scale A  LMP 09/13/2011   SpO2 97%  BMI 41.55 kg/m   Physical Exam Vitals and nursing note reviewed.  Constitutional:      General: She is not in acute distress.    Appearance: She is obese.     Comments: Morbidly obese chronically ill-appearing female  HENT:     Head: Normocephalic and atraumatic.     Nose: Nose normal.  Eyes:     General: No scleral icterus. Cardiovascular:     Rate and Rhythm: Regular rhythm. Tachycardia present.     Pulses: Normal pulses.     Heart sounds: Normal heart sounds.     Comments: No murmurs rubs or gallops.  Tachycardia 108 Pulmonary:     Effort: Pulmonary effort is normal. No respiratory distress.     Breath sounds: No wheezing.     Comments: Faint crackles.  Patient's body habitus was limiting on auscultation. Abdominal:     Palpations: Abdomen is soft.     Tenderness: There is no abdominal tenderness.  Musculoskeletal:     Cervical back: Normal range of motion.     Right lower leg: Edema present.     Left lower leg: Edema present.     Comments: Bilateral lower extremity edema 3+ at the ankle and 1+ proximally with significant tight swollen right calf  Bilateral distal pulses are palpable and symmetric  Skin:    General: Skin is warm and dry.     Capillary Refill: Capillary refill takes less than 2 seconds.     Comments: Patient has  lesions consistent with HS in her right armpit and right groin.  They are not significantly warm and there is no fluctuance.  There is a small amount of draining purulence.  Neurological:     Mental Status: She is alert. Mental status is at baseline.  Psychiatric:        Mood and Affect: Mood normal.        Behavior: Behavior normal.     ED Results / Procedures / Treatments   Labs (all labs ordered are listed, but only abnormal results are displayed) Labs Reviewed  BRAIN NATRIURETIC PEPTIDE - Abnormal; Notable for the following components:      Result Value   B Natriuretic Peptide 501.2 (*)    All other components within normal limits  BASIC METABOLIC PANEL - Abnormal; Notable for the following components:   Sodium 133 (*)    Glucose, Bld 334 (*)    Calcium 8.7 (*)    All other components within normal limits  CBC WITH DIFFERENTIAL/PLATELET - Abnormal; Notable for the following components:   WBC 11.8 (*)    Neutro Abs 8.1 (*)    All other components within normal limits  D-DIMER, QUANTITATIVE (NOT AT [facility]) - Abnormal; Notable for the following components:   D-Dimer, Quant 0.88 (*)    All other components within normal limits  CBG MONITORING, ED - Abnormal; Notable for the following components:   Glucose-Capillary 322 (*)    All other components within normal limits  CBG MONITORING, ED - Abnormal; Notable for the following components:   Glucose-Capillary 188 (*)    All other components within normal limits  TROPONIN I (HIGH SENSITIVITY) - Abnormal; Notable for the following components:   Troponin I (High Sensitivity) 18 (*)    All other components within normal limits  TROPONIN I (HIGH SENSITIVITY) - Abnormal; Notable for the following components:   Troponin I (High Sensitivity) 18 (*)    All other components within normal limits  RESP PANEL BY RT-PCR (  FLU A&B, COVID) [id number]    EKG EKG Interpretation  Date/Time:  Wednesday October 11 2020 12:51:42 EST Ventricular Rate:   107 PR Interval:    QRS Duration: 85 QT Interval:  375 QTC Calculation: 501 R Axis:   120 Text Interpretation: Sinus tachycardia Right axis deviation Borderline T wave abnormalities Borderline prolonged QT interval Baseline wander in lead(s) II III aVF V5 V6 No sig chance from prior ecg No STEMI Confirmed by Octaviano Glow [phone]) on 10/11/2020 12:55:02 PM   Radiology CT Angio Chest PE W/Cm &/Or Wo Cm  Result Date: 10/11/2020 CLINICAL DATA:  Elevated D-dimer. CHF of new onset with dyspnea. Bilateral lower extremity swelling and pain. EXAM: CT ANGIOGRAPHY CHEST WITH CONTRAST TECHNIQUE: Multidetector CT imaging of the chest was performed using the standard protocol during bolus administration of intravenous contrast. Multiplanar CT image reconstructions and MIPs were obtained to evaluate the vascular anatomy. CONTRAST:  131m OMNIPAQUE IOHEXOL 350 MG/ML SOLN COMPARISON:  05/28/2019 chest CT. FINDINGS: Cardiovascular: The study is high quality for the evaluation of pulmonary embolism. There are no filling defects in the central, lobar, segmental or subsegmental pulmonary artery branches to suggest acute pulmonary embolism. Atherosclerotic nonaneurysmal thoracic aorta. Dilated main pulmonary artery (3.6 cm diameter). Borderline mild cardiomegaly. Small pericardial effusion/thickening, slightly increased. Left anterior descending coronary atherosclerosis. Mediastinum/Nodes: No discrete thyroid nodules. Unremarkable esophagus. Mild right axillary adenopathy up to 1.1 cm short axis diameter (series 7/image 39), not appreciably changed. Stable mild left supraclavicular adenopathy up to 1.1 cm (series 7/image 8). Mild right paratracheal adenopathy up to 1.6 cm (series 7/image 23), stable. No pathologically enlarged hilar nodes. Lungs/Pleura: No pneumothorax. Small dependent bilateral pleural effusions, right greater than left. Moderate patchy ground-glass opacities throughout both lungs with worsened  interlobular septal thickening throughout both lungs. No lung masses or significant pulmonary nodules. Upper abdomen: Mild contrast reflux into the IVC and hepatic veins. Musculoskeletal: No aggressive appearing focal osseous lesions. Mild thoracic spondylosis. Mild anasarca. Review of the MIP images confirms the above findings. IMPRESSION: 1. No pulmonary embolism. 2. Borderline mild cardiomegaly. Small pericardial effusion/thickening, slightly increased. Mild contrast reflux into the IVC and hepatic veins, which may indicate right heart failure. 3. Moderate patchy ground-glass opacities throughout both lungs with worsened interlobular septal thickening throughout both lungs, most compatible with cardiogenic pulmonary edema. 4. Small dependent bilateral pleural effusions, right greater than left. 5. Dilated main pulmonary artery, suggesting pulmonary arterial hypertension. 6. Chronic mild right axillary, left supraclavicular and mediastinal lymphadenopathy, stable, presumably reactive. 7. Aortic Atherosclerosis (ICD10-I70.0). Electronically Signed   By: JIlona SorrelM.D.   On: 10/11/2020 14:35   DG Chest Port 1 View  Result Date: 10/11/2020 CLINICAL DATA:  Bilateral lower extremity pain and swelling. EXAM: PORTABLE CHEST 1 VIEW COMPARISON:  PA and lateral chest 05/09/2018. FINDINGS: The cardiopericardial silhouette is enlarged. There is diffuse hazy bilateral airspace disease. No pneumothorax or pleural effusion. IMPRESSION: Enlarged cardiopericardial silhouette compatible with cardiomegaly and/or pericardial effusion. Diffuse hazy airspace opacity likely due to pulmonary edema. Electronically Signed   By: TInge RiseM.D.   On: 10/11/2020 11:24    Procedures .Critical Care Performed by: FTedd Sias PA Authorized by: FTedd Sias PA   Critical care provider statement:    Critical care time (minutes):  45   Critical care was necessary to treat or prevent imminent or life-threatening  deterioration of the following conditions:  Respiratory failure   Critical care was time spent personally by me on the following activities:  Discussions  with consultants, evaluation of patient's response to treatment, examination of patient, ordering and performing treatments and interventions, ordering and review of laboratory studies, ordering and review of radiographic studies, pulse oximetry, re-evaluation of patient's condition, obtaining history from patient or surrogate and review of old charts   (including critical care time)  Medications Ordered in ED Medications  bupivacaine (MARCAINE) 0.5 % injection 10 mL (has no administration in time range)  acetaminophen (TYLENOL) tablet 650 mg (650 mg Oral Given 10/11/20 2034)  acetaminophen (TYLENOL) tablet 650 mg (650 mg Oral Given 10/11/20 1122)  morphine 4 MG/ML injection 4 mg (4 mg Intravenous Given 10/11/20 1122)  furosemide (LASIX) injection 40 mg (40 mg Intravenous Given 10/11/20 1258)  nitroGLYCERIN (NITROGLYN) 2 % ointment 0.5 inch (0.5 inches Topical Given 10/11/20 1342)  iohexol (OMNIPAQUE) 350 MG/ML injection 100 mL (100 mLs Intravenous Contrast Given 10/11/20 1357)  lisinopril (ZESTRIL) tablet 20 mg (20 mg Oral Given 10/11/20 1610)  insulin aspart (novoLOG) injection 10 Units (10 Units Subcutaneous Given 10/11/20 1629)  morphine 4 MG/ML injection 4 mg (4 mg Intravenous Given 10/11/20 2034)    ED Course  I have reviewed the triage vital signs and the nursing notes.  Pertinent labs & imaging results that were available during my care of the patient were reviewed by me and considered in my medical decision making (see chart for details).  Patient is a 45 year old female with past medical history detailed in HPI she has had issues with medical noncompliance due to not having insurance for several years.  She has history of hypertension she is hypertensive and mildly tachycardic on presentation.  She is initially complaining of leg pain seems  that she was started on as needed 20 mg Lasix and has had three doses this over the past week that she has been prescribed it.  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  She was started on doxycycline 8 days ago for this.  Her primary care doctor had concerns for DVT and she had a right lower extremity DVT ultrasound which was negative this past week.  I was able to review this report and there was no indication that images were unsatisfactory per ultrasound tech.  On physical exam patient has lower extremity edema I hear faint crackles on auscultation and she is morbidly obese therefore difficult to see when the patient has JVD habitus is also difficult in assessing lung status.  She does indicate that she has some shortness of breath this seems to have been somewhat longstanding issue however she also has worsening shortness of breath with laying flat.  She has been propping self up she is uncertain about her weights.  Overall I have very high suspicion for heart failure.  She has never been diagnosed with this before.  She has never had a heart attack however does have longstanding history of hypertension.  Discussed the case my attending physician.   BMP with blood sugar 334 no anion gap patient does not appear to be in DKA BNP elevated at 501 given patient is severely morbidly obese body habitus I suspect that this is factitiously low and represents clinically significant heart failure.  Troponin flat at 18/18, she denies any chest pain presently.  I have low suspicion for ACS.  COVID test negative for COVID and flu.  Dimer mildly elevated which prompted CT angiography of chest.  Patient's chest x-ray did show some mild pulmonary edema no pleural effusion and did  show enlarged cardiac silhouette consistent with CHF.  DIMER ELEVATED --> CTA obtained. CTA --> without evidence of PE.  There is cardiomegaly small pericardial  effusion.  Groundglass opacities throughout both lungs favored to be pulmonary edema   IMPRESSION: 1. No pulmonary embolism. 2. Borderline mild cardiomegaly. Small pericardial effusion/thickening, slightly increased. Mild contrast reflux into the IVC and hepatic veins, which may indicate right heart failure. 3. Moderate patchy ground-glass opacities throughout both lungs with worsened interlobular septal thickening throughout both lungs, most compatible with cardiogenic pulmonary edema. 4. Small dependent bilateral pleural effusions, right greater than left. 5. Dilated main pulmonary artery, suggesting pulmonary arterial hypertension. 6. Chronic mild right axillary, left supraclavicular and mediastinal lymphadenopathy, stable, presumably reactive. 7. Aortic Atherosclerosis (ICD10-I70.0). Electronically Signed   By: Ilona Sorrel M.D.   On: 10/11/2020 14:35    Clinical Course as of 10/11/20 2201  Wed Oct 11, 2020  1201 DG Chest [place] 1 View [WF]  1225 Abs Immature Granulocytes: 0.05 [WF]  7471 Discussed with Dr. Irish Lack  of cardiology. He recommends 20 each day of potassium with 40 mg of Lasix daily.  She will need to have rechecked labs in 1 week. I also discussed patient has elevated blood pressure.  He recommends patient continues to take lisinopril hold off on beta-blockers until EF is known. [WF]  1739 Discussed with Dr. Irish Lack. Cardiology will consult. Recommends hosp admit. Consult placed. Pt aware and agreeable.  [WF]  5953 Discussed with hospitalist Dr. Cathlean Sauer who will admit patient to tele w cardiology consulting. [WF]    Clinical Course User Index [WF] Tedd Sias, PA   Failed ambulatory pulse oximeter and admitted for respiratory failure due to CHF.  Hypertension has improved with her lisinopril and nitroglycerin paste.  She does not appear to be overly dyspneic with ambulation even though she desats to 86% Dr. Clayton Bibles of cardiology and myself have suspicion for obesity/OSA may be  contributing to her low SPO2.  I did discuss antibiotic coverage with attending physician Trifan who agrees with my plan to allow patient to finish her doxycycline.  No indication for further antibiotic use.  Patient appears to have continuation of her HS related abscesses. This is does not appear to be related the patient's presentation today.  Patient admitted for new onset CHF with hypoxia SPO2 has normalized on 2 L nasal cannula.  MDM Rules/Calculators/A&P                          Final Clinical Impression(s) / ED Diagnoses Final diagnoses:  Heart failure, unspecified HF chronicity, unspecified heart failure type [facility])  Leg swelling    Rx / DC Orders ED Discharge Orders    None       Tedd Sias, [place] 10/11/20 2210    Tedd Sias, [place] 10/11/20 2212    Wyvonnia Dusky, MD 10/12/20 0730

## 2020-10-11 NOTE — ED Notes (Signed)
Pt hand off to [place], RN. Updated that pt was awaiting transport via [facility]. Pt updated about ETA for transport.

## 2020-10-12 ENCOUNTER — Inpatient Hospital Stay (HOSPITAL_COMMUNITY): Payer: No Typology Code available for payment source

## 2020-10-12 ENCOUNTER — Encounter (HOSPITAL_COMMUNITY): Payer: Self-pay | Admitting: Internal Medicine

## 2020-10-12 DIAGNOSIS — E1165 Type 2 diabetes mellitus with hyperglycemia: Secondary | ICD-10-CM

## 2020-10-12 DIAGNOSIS — I5030 Unspecified diastolic (congestive) heart failure: Secondary | ICD-10-CM

## 2020-10-12 DIAGNOSIS — R52 Pain, unspecified: Secondary | ICD-10-CM | POA: Diagnosis not present

## 2020-10-12 DIAGNOSIS — I10 Essential (primary) hypertension: Secondary | ICD-10-CM

## 2020-10-12 DIAGNOSIS — G4733 Obstructive sleep apnea (adult) (pediatric): Secondary | ICD-10-CM | POA: Diagnosis not present

## 2020-10-12 DIAGNOSIS — J9601 Acute respiratory failure with hypoxia: Secondary | ICD-10-CM

## 2020-10-12 DIAGNOSIS — I509 Heart failure, unspecified: Secondary | ICD-10-CM

## 2020-10-12 LAB — CBC WITH DIFFERENTIAL/PLATELET
Abs Immature Granulocytes: 0.03 10*3/uL (ref 0.00–0.07)
Basophils Absolute: 0.1 10*3/uL (ref 0.0–0.1)
Basophils Relative: 1 %
Eosinophils Absolute: 0.2 10*3/uL (ref 0.0–0.5)
Eosinophils Relative: 2 %
HCT: 40.8 % (ref 36.0–46.0)
Hemoglobin: 13.4 g/dL (ref 12.0–15.0)
Immature Granulocytes: 0 %
Lymphocytes Relative: 32 %
Lymphs Abs: 3.2 10*3/uL (ref 0.7–4.0)
MCH: 29.9 pg (ref 26.0–34.0)
MCHC: 32.8 g/dL (ref 30.0–36.0)
MCV: 91.1 fL (ref 80.0–100.0)
Monocytes Absolute: 0.8 10*3/uL (ref 0.1–1.0)
Monocytes Relative: 8 %
Neutro Abs: 5.8 10*3/uL (ref 1.7–7.7)
Neutrophils Relative %: 57 %
Platelets: 214 10*3/uL (ref 150–400)
RBC: 4.48 MIL/uL (ref 3.87–5.11)
RDW: 13.7 % (ref 11.5–15.5)
WBC: 10 10*3/uL (ref 4.0–10.5)
nRBC: 0 % (ref 0.0–0.2)

## 2020-10-12 LAB — COMPREHENSIVE METABOLIC PANEL
ALT: 20 U/L (ref 0–44)
AST: 11 U/L — ABNORMAL LOW (ref 15–41)
Albumin: 2 g/dL — ABNORMAL LOW (ref 3.5–5.0)
Alkaline Phosphatase: 84 U/L (ref 38–126)
Anion gap: 9 (ref 5–15)
BUN: 8 mg/dL (ref 6–20)
CO2: 26 mmol/L (ref 22–32)
Calcium: 8.3 mg/dL — ABNORMAL LOW (ref 8.9–10.3)
Chloride: 101 mmol/L (ref 98–111)
Creatinine, Ser: 0.67 mg/dL (ref 0.44–1.00)
GFR, Estimated: >60 mL/min (ref >60)
Glucose, Bld: 189 mg/dL — ABNORMAL HIGH (ref 70–99)
Potassium: 3.7 mmol/L (ref 3.5–5.1)
Sodium: 136 mmol/L (ref 135–145)
Total Bilirubin: 0.5 mg/dL (ref 0.3–1.2)
Total Protein: 5.9 g/dL — ABNORMAL LOW (ref 6.5–8.1)

## 2020-10-12 LAB — ECHOCARDIOGRAM COMPLETE
AR max vel: 1.53 cm2
AV Area VTI: 1.59 cm2
AV Area mean vel: 1.54 cm2
AV Mean grad: 4 mmHg
AV Peak grad: 8.5 mmHg
Ao pk vel: 1.46 m/s
Area-P 1/2: 4.41 cm2
Height: 65 in
MV VTI: 1.24 cm2
S' Lateral: 4.4 cm
Weight: 3952 oz

## 2020-10-12 LAB — MAGNESIUM: Magnesium: 1.3 mg/dL — ABNORMAL LOW (ref 1.7–2.4)

## 2020-10-12 LAB — GLUCOSE, CAPILLARY
Glucose-Capillary: 181 mg/dL — ABNORMAL HIGH (ref 70–99)
Glucose-Capillary: 162 mg/dL — ABNORMAL HIGH (ref 70–99)
Glucose-Capillary: 111 mg/dL — ABNORMAL HIGH (ref 70–99)
Glucose-Capillary: 81 mg/dL (ref 70–99)

## 2020-10-12 LAB — HIV ANTIBODY (ROUTINE TESTING W REFLEX): HIV Screen 4th Generation wRfx: NONREACTIVE

## 2020-10-12 LAB — TSH: TSH: 1.477 u[IU]/mL (ref 0.350–4.500)

## 2020-10-12 MED ORDER — INSULIN GLARGINE 100 UNIT/ML ~~LOC~~ SOLN
40.0000 [IU] | Freq: Every day | SUBCUTANEOUS | Status: DC
  Administered 2020-10-12: 40 [IU] via SUBCUTANEOUS
  Filled 2020-10-12 (×2): qty 0.4

## 2020-10-12 MED ORDER — INSULIN ASPART 100 UNIT/ML ~~LOC~~ SOLN
6.0000 [IU] | Freq: Three times a day (TID) | SUBCUTANEOUS | Status: DC
  Administered 2020-10-12 – 2020-10-16 (×12): 6 [IU] via SUBCUTANEOUS

## 2020-10-12 MED ORDER — FUROSEMIDE 10 MG/ML IJ SOLN
40.0000 mg | Freq: Two times a day (BID) | INTRAMUSCULAR | Status: DC
  Administered 2020-10-12 – 2020-10-13 (×4): 40 mg via INTRAVENOUS
  Filled 2020-10-12 (×5): qty 4

## 2020-10-12 MED ORDER — DOXYCYCLINE HYCLATE 100 MG PO TABS
100.0000 mg | ORAL_TABLET | Freq: Two times a day (BID) | ORAL | Status: DC
  Administered 2020-10-12 – 2020-10-16 (×9): 100 mg via ORAL
  Filled 2020-10-12 (×9): qty 1

## 2020-10-12 MED ORDER — LISINOPRIL 20 MG PO TABS
20.0000 mg | ORAL_TABLET | Freq: Every day | ORAL | Status: DC
  Administered 2020-10-12: 20 mg via ORAL
  Filled 2020-10-12: qty 1

## 2020-10-12 MED ORDER — TRAMADOL HCL 50 MG PO TABS
50.0000 mg | ORAL_TABLET | Freq: Four times a day (QID) | ORAL | Status: DC | PRN
  Administered 2020-10-12 (×2): 50 mg via ORAL
  Filled 2020-10-12 (×2): qty 1

## 2020-10-12 MED ORDER — NICOTINE 21 MG/24HR TD PT24
21.0000 mg | MEDICATED_PATCH | Freq: Every day | TRANSDERMAL | Status: DC
  Administered 2020-10-12 – 2020-10-16 (×5): 21 mg via TRANSDERMAL
  Filled 2020-10-12 (×5): qty 1

## 2020-10-12 MED ORDER — LISINOPRIL 20 MG PO TABS: 20.0000 mg | ORAL_TABLET | Freq: Once | ORAL | Status: DC

## 2020-10-12 MED ORDER — LOSARTAN POTASSIUM 50 MG PO TABS
100.0000 mg | ORAL_TABLET | Freq: Every day | ORAL | Status: DC
  Administered 2020-10-13: 100 mg via ORAL
  Filled 2020-10-12: qty 2

## 2020-10-12 MED ORDER — PERFLUTREN LIPID MICROSPHERE
1.0000 mL | INTRAVENOUS | Status: AC | PRN
  Administered 2020-10-12: 3 mL via INTRAVENOUS
  Filled 2020-10-12: qty 10

## 2020-10-12 MED ORDER — INSULIN ASPART 100 UNIT/ML ~~LOC~~ SOLN
0.0000 [IU] | Freq: Three times a day (TID) | SUBCUTANEOUS | Status: DC
  Administered 2020-10-12 – 2020-10-14 (×5): 2 [IU] via SUBCUTANEOUS
  Administered 2020-10-15: 3 [IU] via SUBCUTANEOUS
  Administered 2020-10-15 – 2020-10-16 (×2): 1 [IU] via SUBCUTANEOUS
  Administered 2020-10-16: 3 [IU] via SUBCUTANEOUS

## 2020-10-12 MED ORDER — POTASSIUM CHLORIDE CRYS ER 20 MEQ PO TBCR
30.0000 meq | EXTENDED_RELEASE_TABLET | Freq: Once | ORAL | Status: AC
  Administered 2020-10-12: 30 meq via ORAL
  Filled 2020-10-12: qty 1

## 2020-10-12 MED ORDER — LOSARTAN POTASSIUM 50 MG PO TABS: 100.0000 mg | ORAL_TABLET | Freq: Every day | ORAL | Status: DC

## 2020-10-12 MED ORDER — MAGNESIUM SULFATE 4 GM/100ML IV SOLN
4.0000 g | Freq: Once | INTRAVENOUS | Status: AC
  Administered 2020-10-12: 4 g via INTRAVENOUS
  Filled 2020-10-12: qty 100

## 2020-10-12 MED ORDER — LISINOPRIL 40 MG PO TABS: 40.0000 mg | ORAL_TABLET | Freq: Every day | ORAL | Status: DC

## 2020-10-12 MED ORDER — HYDROCODONE-ACETAMINOPHEN 5-325 MG PO TABS
1.0000 | ORAL_TABLET | Freq: Four times a day (QID) | ORAL | Status: DC | PRN
  Administered 2020-10-12 – 2020-10-16 (×13): 1 via ORAL
  Filled 2020-10-12 (×13): qty 1

## 2020-10-12 MED ORDER — ATORVASTATIN CALCIUM 40 MG PO TABS
40.0000 mg | ORAL_TABLET | Freq: Every day | ORAL | Status: DC
  Administered 2020-10-12 – 2020-10-13 (×2): 40 mg via ORAL
  Filled 2020-10-12 (×2): qty 1

## 2020-10-12 MED ORDER — ENOXAPARIN SODIUM 40 MG/0.4ML ~~LOC~~ SOLN
40.0000 mg | SUBCUTANEOUS | Status: DC
  Administered 2020-10-12 – 2020-10-13 (×2): 40 mg via SUBCUTANEOUS
  Filled 2020-10-12 (×2): qty 0.4

## 2020-10-12 NOTE — Progress Notes (Signed)
ABI's have been completed. Preliminary results can be found in CV Proc through chart review.   10/12/20 12:20 PM Catherine Espinoza RVT

## 2020-10-12 NOTE — Progress Notes (Addendum)
Heart Failure Nurse Navigator Progress Note  PCP: [name], NP PCP-Cardiologist: none (last seen in 2012 by Dr. Stanford Breed) Admission Diagnosis: acute CHF Admitted from: home with daughter  Presentation:   Catherine Espinoza presented with BLE pitting edema and leg pain. Known R groin abscess oozing, PCP has previous abx. Resting in bed with lunch at bedside on room air.   ECHO/ LVEF: 1/13 completed, pending final review.   Clinical Course:  Past Medical History:  Diagnosis Date  . Anemia, unspecified   . Chronic left-sided thoracic back pain   . Excessive or frequent menstruation   . Morbid obesity ([place])   . Obstructive sleep apnea 02/11/11   Sleep study: severe OSA- rec CPAP 20cm small  full face mask  . Proteinuria   . Thoracic radiculopathy   . Type II or unspecified type diabetes mellitus without mention of complication, not stated as uncontrolled   . Unspecified essential hypertension      Social History   Socioeconomic History  . Marital status: Single    Spouse name: Not on file  . Number of children: 1  . Years of education: Not on file  . Highest education level: Associate degree: occupational, Hotel manager, or vocational program  Occupational History  . Occupation: cna/med Engineer, production: [employer]  Tobacco Use  . Smoking status: Current Every Day Smoker    Packs/day: 0.50    Years: 10.00    Pack years: 5.00    Types: Cigarettes  . Smokeless tobacco: Never Used  Vaping Use  . Vaping Use: Never used  Substance and Sexual Activity  . Alcohol use: Yes    Alcohol/week: 0.0 standard drinks    Comment: occasional use  . Drug use: No  . Sexual activity: Yes    Birth control/protection: Surgical  Other Topics Concern  . 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 Difficulty of Paying Living Expenses:  Hard  Food Insecurity: No Food Insecurity  . Worried About Charity fundraiser in the Last Year: Never true  . Ran Out of Food in the Last Year: Never true  Transportation Needs: No Transportation Needs  . Lack of Transportation (Medical): No  . 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 Able to read, write and comprehend.   Barriers of Care:   Poor insurance coverage from work.  Unable to afford medications.   Considerations/Referrals:   Referral made to Heart Failure Pharmacist Stewardship: yes, appreciated Referral made to Heart Impact Team RNCM/CSW: not at this time.  Items for Follow-up on DC/TOC: Medication cost. Ability to get to f/u appts. adherence to medication.  Will continue to follow this admission. Education to be complete at DC.  Pricilla Holm, RN, BSN Heart Failure Nurse Navigator [facility] [phone]

## 2020-10-12 NOTE — Progress Notes (Signed)
Inpatient Diabetes Program Recommendations  AACE/ADA: New Consensus Statement on Inpatient Glycemic Control (2015)  Target Ranges:  Prepandial:   less than 140 mg/dL      Peak postprandial:   less than 180 mg/dL (1-2 hours)      Critically ill patients:  140 - 180 mg/dL   Lab Results  Component Value Date   GLUCAP 111 (H) 10/12/2020   HGBA1C 13.5 (A) 10/04/2020    Review of Glycemic Control Results for Catherine Espinoza, Catherine Espinoza (MRN [id number]) as of 10/12/2020 17:09  Ref. Range 10/11/2020 20:40 10/11/2020 22:05 10/12/2020 06:10 10/12/2020 12:46 10/12/2020 16:12  Glucose-Capillary Latest Ref Range: 70 - 99 mg/dL 188 (H) 179 (H) 181 (H) 162 (H) 111 (H)   Diabetes history: DM 2 Outpatient Diabetes medications:  Basaglar 65 units daily, Freestyle Libre Current orders for Inpatient glycemic control:  Lantus 40 units daily, Novolog 6 units tid with meals, Novolog sensitive tid with meals Inpatient Diabetes Program Recommendations:    Spoke with patient regarding A1C and DM.  She admits that she has not taken insulin for over a year due to cost. NP just wrote new Rx. For Basaglar however patient states that out of pocket cost for that is too high as well (160$ per rx.).  She may need more affordable option such as Reli-on 70/30 bid which is available in pen option.  Will ask for TOC to see patient to do benefits check for Basaglar just in case it is affordable.    If Basaglar too expensive, consider Reli-on 70/30 22 units bid (with breakfast and supper)- will likely need adjustment outpatient.  Discussed goal blood sugars with patient and importance of keeping blood sugars controlled.  She states she is going to do a better job.  She has new Rx. For [employer] as well which she is excited about using.  Will order Living well with DM booklet for patient.    Thanks,  Adah Perl, RN, BC-ADM Inpatient Diabetes Coordinator Pager [phone] (8a-5p)

## 2020-10-12 NOTE — Progress Notes (Signed)
Patient seen and examined earlier this afternoon, her SO was at the bedside. States that tramadol is not helping with her leg pain. Denies dyspnea or chest pain.  -Discontinued tramadol, started norco as needed.

## 2020-10-12 NOTE — Care Management (Signed)
1721 10-12-20 Case Manager received the heart failure home health consult. Case Manager spoke with patient regarding transition home. Prior to arrival patient was independent from home. Boyfriend at the bedside upon conversation. Patient works and she has transportation to her job and appointments. Patient previously scheduled a hospital follow up appointment with PCP [name]. Case Manager will place the appointment on the AVS. Per patient she has a scale at home. Case Manager received a consult for benefits check for Basaglar- pt states her co pay is $180.00. Patient will benefit from a cheaper medication at this time. Case Manager will continue to follow for additional needs. Graves-Bigelow, Ocie Cornfield, RN, BSN Case Manager

## 2020-10-12 NOTE — H&P (Signed)
History and Physical    [place] Stell AXE:[id number] DOB: 03-19-1976 DOA: 10/11/2020  PCP: [name], NP  Patient coming from: Home.  Chief Complaint: Lower extremity swelling and pain.  HPI: Catherine Espinoza is a 45 y.o. female with history of diabetes mellitus type 2, morbid obesity, hypertension who has not been compliant with her medications due to patient having financial issues has been experiencing increasing swelling in the lower extremities more on the right side with pain for which patient's primary care physician had placed her on doxycycline despite taking which the swelling and pain has not improved.  And patient presents to the ER at [facility].  Denies fever chills chest pain shortness of breath nausea vomiting or diarrhea.  Patient states she may have lost weight over the last 1 year.  ED Course: In the ER Dopplers were negative for DVT.  CT angiogram of the chest shows features concerning for acute CHF.  COVID test is negative.  EKG shows sinus tachycardia.  BNP was 501 cardiology was consulted patient started on IV Lasix and admitted for further management.  In addition patient's blood sugar was found to be in the 300s has not been taking the Lantus as prescribed since patient states she has not refilled it yet due to financial issues and was given NovoLog in the ER.  Review of Systems: As per HPI, rest all negative.   Past Medical History:  Diagnosis Date  . Anemia, unspecified   . Chronic left-sided thoracic back pain   . Excessive or frequent menstruation   . Morbid obesity ([place])   . Obstructive sleep apnea 02/11/11   Sleep study: severe OSA- rec CPAP 20cm small  full face mask  . Proteinuria   . Thoracic radiculopathy   . Type II or unspecified type diabetes mellitus without mention of complication, not stated as uncontrolled   . Unspecified essential hypertension     Past Surgical History:  Procedure Laterality Date  . ABDOMINAL HYSTERECTOMY     . ABLATION COLPOCLESIS    . CESAREAN SECTION    . CLEFT PALATE REPAIR    . cyst removal    . ECTOPIC PREGNANCY SURGERY     x 2  . OOPHORECTOMY Right 2002     reports that she has been smoking cigarettes. She has a 5.00 pack-year smoking history. She has never used smokeless tobacco. She reports current alcohol use. She reports that she does not use drugs.  Allergies  Allergen Reactions  . Ibuprofen     REACTION: knots in mouth with SOB  . Labetalol Nausea Only  . Aspirin     Knots in mouth     Family History  Problem Relation Age of Onset  . Heart attack Maternal Aunt   . Diabetes Mother   . Cancer Father        oral cancer    Prior to Admission medications   Medication Sig Start Date End Date Taking? Authorizing Provider  atorvastatin (LIPITOR) 40 MG tablet Take 1 tablet (40 mg total) by mouth daily. 10/03/20   [name], NP  BAYER MICROLET LANCETS lancets Use as instructed to check blood sugar three times daily.  DX  E11.65 Patient not taking: No sig reported 10/24/17   [name], NP  blood glucose meter kit and supplies KIT Dispense based on patient and insurance preference. Use up to four times daily as directed. (FOR ICD-9 250.00, 250.01). 10/03/20   [name], NP  Continuous Blood Gluc  Receiver (FREESTYLE LIBRE 2 READER) DEVI Use as directed 10/10/20   [name], NP  Continuous Blood Gluc Sensor (FREESTYLE LIBRE 2 SENSOR) MISC Use as directed 10/10/20   [name], NP  Continuous Blood Gluc Sensor ([place]) MISC Apply every 14 days. 10/03/20   [name], NP  doxycycline (VIBRA-TABS) 100 MG tablet Take 1 tablet (100 mg total) by mouth 2 (two) times daily. 10/03/20   [name], NP  furosemide (LASIX) 20 MG tablet Take 1 tablet (20 mg total) by mouth daily as needed. 10/03/20   [name], NP  glucose blood (BAYER CONTOUR TEST) test strip Use to check blood sugar 3 times per day.  DX   E11.65 Patient not taking: No sig reported 10/24/17   [name], NP  Insulin Glargine (BASAGLAR KWIKPEN) 100 UNIT/ML Inject 65 Units into the skin daily. 10/04/20   [name], NP  Insulin Syringe 27G X 1/2" 0.5 ML MISC Use as directed Patient not taking: Reported on 10/03/2020 03/16/20   [name], NP  lisinopril (ZESTRIL) 20 MG tablet Take 1 tablet (20 mg total) by mouth daily. 10/03/20   [name], NP  valACYclovir (VALTREX) 1000 MG tablet Take 1 tablet by mouth once daily for 5 days at start of rash 10/03/20   [name], NP    Physical Exam: Constitutional: Moderately built and nourished. Vitals:   10/11/20 1900 10/11/20 2155 10/11/20 2208 10/11/20 2359  BP: 128/89  (!) 159/101 127/86  Pulse: (!) 104  97 84  Resp: '18  18 18  ' Temp:   99.1 F (37.3 C) 98.2 F (36.8 C)  TempSrc:   Oral Oral  SpO2: 97%  99% 99%  Weight:  113.3 kg    Height:  '5\' 5"'  (1.651 m)     Eyes: Anicteric no pallor. ENMT: No discharge from the ears eyes nose or mouth. Neck: No mass felt.  No neck rigidity. Respiratory: No rhonchi or crepitations. Cardiovascular: S1-S2 heard. Abdomen: Soft nontender bowel sounds present. Musculoskeletal: Bilateral lower extremity edema present. Skin: Small abscess on the axilla. Neurologic: Alert awake oriented to time place and person.  Moves all extremities. Psychiatric: Appears normal.  Normal affect.   Labs on Admission: I have personally reviewed following labs and imaging studies  CBC: Recent Labs  Lab 10/11/20 1128  WBC 11.8*  NEUTROABS 8.1*  HGB 14.3  HCT 43.6  MCV 90.6  PLT 454   Basic Metabolic Panel: Recent Labs  Lab 10/11/20 1128  NA 133*  K 3.8  CL 100  CO2 25  GLUCOSE 334*  BUN 9  CREATININE 0.60  CALCIUM 8.7*   GFR: Estimated Creatinine Clearance: 112.6 mL/min (by C-G formula based on SCr of 0.6 mg/dL). Liver Function Tests: No results for input(s): AST, ALT, ALKPHOS, BILITOT, PROT, ALBUMIN in  the last 168 hours. No results for input(s): LIPASE, AMYLASE in the last 168 hours. No results for input(s): AMMONIA in the last 168 hours. Coagulation Profile: No results for input(s): INR, PROTIME in the last 168 hours. Cardiac Enzymes: No results for input(s): CKTOTAL, CKMB, CKMBINDEX, TROPONINI in the last 168 hours. BNP (last 3 results) No results for input(s): PROBNP in the last 8760 hours. HbA1C: No results for input(s): HGBA1C in the last 72 hours. CBG: Recent Labs  Lab 10/11/20 1612 10/11/20 2040 10/11/20 2205  GLUCAP 322* 188* 179*   Lipid Profile: No results for input(s): CHOL, HDL, LDLCALC, TRIG, CHOLHDL, LDLDIRECT in the last 72 hours. Thyroid Function Tests: No  results for input(s): TSH, T4TOTAL, FREET4, T3FREE, THYROIDAB in the last 72 hours. Anemia Panel: No results for input(s): VITAMINB12, FOLATE, FERRITIN, TIBC, IRON, RETICCTPCT in the last 72 hours. Urine analysis:    Component Value Date/Time   COLORURINE YELLOW 05/09/2018 2058   APPEARANCEUR HAZY (A) 05/09/2018 2058   LABSPEC 1.010 05/09/2018 2058   PHURINE 5.5 05/09/2018 2058   GLUCOSEU >=500 (A) 05/09/2018 2058   GLUCOSEU >=1000 (A) 10/24/2017 1406   HGBUR TRACE (A) 05/09/2018 2058   BILIRUBINUR neg 05/25/2019 1603   KETONESUR NEGATIVE 05/09/2018 2058   PROTEINUR Positive (A) 05/25/2019 1603   PROTEINUR 30 (A) 05/09/2018 2058   UROBILINOGEN negative (A) 05/25/2019 1603   UROBILINOGEN 0.2 10/24/2017 1406   NITRITE neg 05/25/2019 1603   NITRITE NEGATIVE 05/09/2018 2058   LEUKOCYTESUR Negative 05/25/2019 1603   Sepsis Labs: '@LABRCNTIP' (procalcitonin:4,lacticidven:4) ) Recent Results (from the past 240 hour(s))  Resp Panel by RT-PCR (Flu A&B, Covid) Nasopharyngeal Swab     Status: None   Collection Time: 10/11/20  5:24 PM   Specimen: Nasopharyngeal Swab; Nasopharyngeal(NP) swabs in vial transport medium  Result Value Ref Range Status   SARS Coronavirus 2 by RT PCR NEGATIVE NEGATIVE Final     Comment: (NOTE) SARS-CoV-2 target nucleic acids are NOT DETECTED.  The SARS-CoV-2 RNA is generally detectable in upper respiratory specimens during the acute phase of infection. The lowest concentration of SARS-CoV-2 viral copies this assay can detect is 138 copies/mL. A negative result does not preclude SARS-Cov-2 infection and should not be used as the sole basis for treatment or other patient management decisions. A negative result may occur with  improper specimen collection/handling, submission of specimen other than nasopharyngeal swab, presence of viral mutation(s) within the areas targeted by this assay, and inadequate number of viral copies(<138 copies/mL). A negative result must be combined with clinical observations, patient history, and epidemiological information. The expected result is Negative.  Fact Sheet for Patients:  EntrepreneurPulse.com.au  Fact Sheet for Healthcare Providers:  IncredibleEmployment.be  This test is no t yet approved or cleared by the [place] FDA and  has been authorized for detection and/or diagnosis of SARS-CoV-2 by FDA under an Emergency Use Authorization (EUA). This EUA will remain  in effect (meaning this test can be used) for the duration of the COVID-19 declaration under Section 564(b)(1) of the Act, 21 U.S.C.section 360bbb-3(b)(1), unless the authorization is terminated  or revoked sooner.       Influenza A by PCR NEGATIVE NEGATIVE Final   Influenza B by PCR NEGATIVE NEGATIVE Final    Comment: (NOTE) The Xpert Xpress SARS-CoV-2/FLU/RSV plus assay is intended as an aid in the diagnosis of influenza from Nasopharyngeal swab specimens and should not be used as a sole basis for treatment. Nasal washings and aspirates are unacceptable for Xpert Xpress SARS-CoV-2/FLU/RSV testing.  Fact Sheet for Patients: EntrepreneurPulse.com.au  Fact Sheet for Healthcare  Providers: IncredibleEmployment.be  This test is not yet approved or cleared by the [place] FDA and has been authorized for detection and/or diagnosis of SARS-CoV-2 by FDA under an Emergency Use Authorization (EUA). This EUA will remain in effect (meaning this test can be used) for the duration of the COVID-19 declaration under Section 564(b)(1) of the Act, 21 U.S.C. section 360bbb-3(b)(1), unless the authorization is terminated or revoked.  Performed at [facility], [address] [place], [place] [address]      Radiological Exams on Admission: CT Angio Chest PE W/Cm &/Or Wo Cm  Result  Date: 10/11/2020 CLINICAL DATA:  Elevated D-dimer. CHF of new onset with dyspnea. Bilateral lower extremity swelling and pain. EXAM: CT ANGIOGRAPHY CHEST WITH CONTRAST TECHNIQUE: Multidetector CT imaging of the chest was performed using the standard protocol during bolus administration of intravenous contrast. Multiplanar CT image reconstructions and MIPs were obtained to evaluate the vascular anatomy. CONTRAST:  130m OMNIPAQUE IOHEXOL 350 MG/ML SOLN COMPARISON:  05/28/2019 chest CT. FINDINGS: Cardiovascular: The study is high quality for the evaluation of pulmonary embolism. There are no filling defects in the central, lobar, segmental or subsegmental pulmonary artery branches to suggest acute pulmonary embolism. Atherosclerotic nonaneurysmal thoracic aorta. Dilated main pulmonary artery (3.6 cm diameter). Borderline mild cardiomegaly. Small pericardial effusion/thickening, slightly increased. Left anterior descending coronary atherosclerosis. Mediastinum/Nodes: No discrete thyroid nodules. Unremarkable esophagus. Mild right axillary adenopathy up to 1.1 cm short axis diameter (series 7/image 39), not appreciably changed. Stable mild left supraclavicular adenopathy up to 1.1 cm (series 7/image 8). Mild right paratracheal adenopathy up to 1.6 cm (series 7/image 23), stable. No  pathologically enlarged hilar nodes. Lungs/Pleura: No pneumothorax. Small dependent bilateral pleural effusions, right greater than left. Moderate patchy ground-glass opacities throughout both lungs with worsened interlobular septal thickening throughout both lungs. No lung masses or significant pulmonary nodules. Upper abdomen: Mild contrast reflux into the IVC and hepatic veins. Musculoskeletal: No aggressive appearing focal osseous lesions. Mild thoracic spondylosis. Mild anasarca. Review of the MIP images confirms the above findings. IMPRESSION: 1. No pulmonary embolism. 2. Borderline mild cardiomegaly. Small pericardial effusion/thickening, slightly increased. Mild contrast reflux into the IVC and hepatic veins, which may indicate right heart failure. 3. Moderate patchy ground-glass opacities throughout both lungs with worsened interlobular septal thickening throughout both lungs, most compatible with cardiogenic pulmonary edema. 4. Small dependent bilateral pleural effusions, right greater than left. 5. Dilated main pulmonary artery, suggesting pulmonary arterial hypertension. 6. Chronic mild right axillary, left supraclavicular and mediastinal lymphadenopathy, stable, presumably reactive. 7. Aortic Atherosclerosis (ICD10-I70.0). Electronically Signed   By: JIlona SorrelM.D.   On: 10/11/2020 14:35   DG Chest Port 1 View  Result Date: 10/11/2020 CLINICAL DATA:  Bilateral lower extremity pain and swelling. EXAM: PORTABLE CHEST 1 VIEW COMPARISON:  PA and lateral chest 05/09/2018. FINDINGS: The cardiopericardial silhouette is enlarged. There is diffuse hazy bilateral airspace disease. No pneumothorax or pleural effusion. IMPRESSION: Enlarged cardiopericardial silhouette compatible with cardiomegaly and/or pericardial effusion. Diffuse hazy airspace opacity likely due to pulmonary edema. Electronically Signed   By: TInge RiseM.D.   On: 10/11/2020 11:24    EKG: Independently reviewed.  Sinus  tachycardia.  Assessment/Plan Principal Problem:   CHF, acute ([place] Active Problems:   Diabetes type 2, uncontrolled ([place]   MORBID OBESITY   TOBACCO ABUSE   Hypertension   Acute CHF (congestive heart failure) ([place]    1. Acute CHF unknown EF for which patient has been placed on Lasix 40 mg IV every 12.  Will check 2D echo follow intake output Daily weights metabolic panel.  Discussed with cardiology.  Check ABI since patient still complains of significant pain in the lower extremity. 2. Diabetes mellitus type 2 uncontrolled with hyperglycemia recent hemoglobin A1c was around 13.  Patient was just recently started on Lantus.  I have placed patient on Lantus and Premeal insulin to closely follow CBGs. 3. Hypertension on lisinopril. 4. Tobacco abuse advised about quitting. 5. Morbid obesity will need counseling. 6. Axillary abscess for which doxycycline be continued closely monitor.  Since patient has CHF with significant edema will  need close monitoring for any further worsening in inpatient status.   DVT prophylaxis: Lovenox. Code Status: Full code. Family Communication: Discussed with patient. Disposition Plan: Home. Consults called: Cardiology. Admission status: Inpatient.   Rise Patience MD [facility] Pager [phone].  If 7PM-7AM, please contact night-coverage [email or web address] Password [facility]  10/12/2020, 12:39 AM

## 2020-10-12 NOTE — Progress Notes (Signed)
Heart Failure Stewardship Pharmacist Progress Note   PCP: [name], NP PCP-Cardiologist: No primary care provider on file.    HPI:  45 YO female with PMH of T2DM, HTN and morbid obesity. Patient presented to ED with worsening leg pain and swelling. CT showed concerns for acute CHF, with both cardiomegaly and pulmonary edema. BNP was 501. ECHO completed today; pending results. Of note, patient has not been adherent to medications due to affordability issues.   Current HF Medications: Losartan 100 mg once daily  Furosemide 40 mg IV BID   Prior to admission HF Medications: Lisinopril 20 mg daily (non-adherent)   Pertinent Lab Values: . Serum creatinine 0.67, BUN 8, Potassium 3.7, Sodium 136, BNP 51,   Vital Signs: . Weight: 247 lbs (admission weight: 240 lbs) . Blood pressure: 130s-150s/90s . Heart rate: 90s-100s . ReDS reading: does not qualify due to BMI  Medication Assistance / Insurance Benefits Check: Does the patient have prescription insurance?  No  Does the patient qualify for medication assistance through manufacturers or grants?   No . Eligible grants and/or patient assistance programs: n/a . Medication assistance applications in progress: n/a  . Medication assistance applications approved: n/a Approved medication assistance renewals will be completed by: n/a  Outpatient Pharmacy:  Prior to admission outpatient pharmacy: [id number] OP Is the patient willing to use [facility] pharmacy at discharge? Pending Is the patient willing to transition their outpatient pharmacy to utilize a [facility] outpatient pharmacy? n/a    Assessment: 1. Suspected acute on chronic systolic CHF (EF unknown); awaiting ECHO results. Consistent with NYHA class II symptoms. - Continue furosemide 40 mg BID - Continue losartan 100 mg; patient likely a great candidate for Entresto, can consider making this changes once ECHO is completed - Consider the addition of carvedilol  once ECHO is completed; BP & HR tolerable for this addition.  - Consider an SGLT-2 prior to discharge; could be beneficial for both CHF and T2DM - Consider spironolactone; depending on results of ECHO, patient could be a great candidate   Plan: 1) Medication changes recommended at this time: - No recommendations to make at this time.   2) Patient assistance application(s): - Patient has commercial insurance through [employer], which would provide the following monthly copays:  Entresto = $90 -> with manufacturer copay card = $10  Farxiga = $15 -> with manufacturer copay card = $0  Spironolactone = $10  Carvedilol = $6.03  Furosemide = $3.87 - Patient current takes atorvastatin, which is not covered by insurance and costs ~ $17/month: switching to rosuvastatin would allow for a $6.95 copay/month - Patient current received insulin glargine, which is not preferred by insurance and costs $180 copay/month: switching to Tyler Aas would allow for a $24.99 copay/month  3)  Education  - To be completed prior to discharge  Harriet Pho, PharmD PGY-1 Community Pharmacy Resident   10/12/2020 3:34 PM  Kerby Nora, PharmD, BCPS Heart Failure Stewardship Pharmacist Phone [phone]

## 2020-10-12 NOTE — Progress Notes (Signed)
  Echocardiogram 2D Echocardiogram has been performed.  Catherine Espinoza 10/12/2020, 3:20 PM

## 2020-10-13 ENCOUNTER — Encounter (HOSPITAL_COMMUNITY)
Admission: EM | Disposition: A | Discharge status: Home / Self Care | Payer: Self-pay | Source: Home / Self Care | Attending: Internal Medicine

## 2020-10-13 DIAGNOSIS — I2510 Atherosclerotic heart disease of native coronary artery without angina pectoris: Secondary | ICD-10-CM | POA: Diagnosis not present

## 2020-10-13 DIAGNOSIS — I25110 Atherosclerotic heart disease of native coronary artery with unstable angina pectoris: Secondary | ICD-10-CM

## 2020-10-13 DIAGNOSIS — I5021 Acute systolic (congestive) heart failure: Secondary | ICD-10-CM | POA: Diagnosis not present

## 2020-10-13 HISTORY — PX: CORONARY STENT INTERVENTION: CATH118234

## 2020-10-13 HISTORY — PX: RIGHT/LEFT HEART CATH AND CORONARY ANGIOGRAPHY: CATH118266

## 2020-10-13 LAB — BASIC METABOLIC PANEL
Anion gap: 12 (ref 5–15)
BUN: 14 mg/dL (ref 6–20)
CO2: 26 mmol/L (ref 22–32)
Calcium: 8.7 mg/dL — ABNORMAL LOW (ref 8.9–10.3)
Chloride: 101 mmol/L (ref 98–111)
Creatinine, Ser: 0.61 mg/dL (ref 0.44–1.00)
GFR, Estimated: >60 mL/min (ref >60)
Glucose, Bld: 63 mg/dL — ABNORMAL LOW (ref 70–99)
Potassium: 3.7 mmol/L (ref 3.5–5.1)
Sodium: 139 mmol/L (ref 135–145)

## 2020-10-13 LAB — POCT I-STAT EG7
Acid-Base Excess: 6 mmol/L — ABNORMAL HIGH (ref 0.0–2.0)
Acid-Base Excess: 3 mmol/L — ABNORMAL HIGH (ref 0.0–2.0)
Bicarbonate: 31.6 mmol/L — ABNORMAL HIGH (ref 20.0–28.0)
Bicarbonate: 28 mmol/L (ref 20.0–28.0)
Calcium, Ion: 1.1 mmol/L — ABNORMAL LOW (ref 1.15–1.40)
Calcium, Ion: 0.91 mmol/L — ABNORMAL LOW (ref 1.15–1.40)
HCT: 40 % (ref 36.0–46.0)
HCT: 37 % (ref 36.0–46.0)
Hemoglobin: 13.6 g/dL (ref 12.0–15.0)
Hemoglobin: 12.6 g/dL (ref 12.0–15.0)
O2 Saturation: 72 %
O2 Saturation: 72 %
Potassium: 3.4 mmol/L — ABNORMAL LOW (ref 3.5–5.1)
Potassium: 2.8 mmol/L — ABNORMAL LOW (ref 3.5–5.1)
Sodium: 142 mmol/L (ref 135–145)
Sodium: 144 mmol/L (ref 135–145)
TCO2: 33 mmol/L — ABNORMAL HIGH (ref 22–32)
TCO2: 29 mmol/L (ref 22–32)
pCO2, Ven: 47.1 mmHg (ref 44.0–60.0)
pCO2, Ven: 42.9 mmHg — ABNORMAL LOW (ref 44.0–60.0)
pH, Ven: 7.436 — ABNORMAL HIGH (ref 7.250–7.430)
pH, Ven: 7.424 (ref 7.250–7.430)
pO2, Ven: 37 mmHg (ref 32.0–45.0)
pO2, Ven: 38 mmHg (ref 32.0–45.0)

## 2020-10-13 LAB — POCT I-STAT 7, (LYTES, BLD GAS, ICA,H+H)
Acid-Base Excess: 6 mmol/L — ABNORMAL HIGH (ref 0.0–2.0)
Bicarbonate: 30.7 mmol/L — ABNORMAL HIGH (ref 20.0–28.0)
Calcium, Ion: 1.11 mmol/L — ABNORMAL LOW (ref 1.15–1.40)
HCT: 40 % (ref 36.0–46.0)
Hemoglobin: 13.6 g/dL (ref 12.0–15.0)
O2 Saturation: 97 %
Potassium: 3.5 mmol/L (ref 3.5–5.1)
Sodium: 142 mmol/L (ref 135–145)
TCO2: 32 mmol/L (ref 22–32)
pCO2 arterial: 42.1 mmHg (ref 32.0–48.0)
pH, Arterial: 7.47 — ABNORMAL HIGH (ref 7.350–7.450)
pO2, Arterial: 84 mmHg (ref 83.0–108.0)

## 2020-10-13 LAB — CBC
HCT: 42.1 % (ref 36.0–46.0)
Hemoglobin: 13.8 g/dL (ref 12.0–15.0)
MCH: 29.5 pg (ref 26.0–34.0)
MCHC: 32.8 g/dL (ref 30.0–36.0)
MCV: 90 fL (ref 80.0–100.0)
Platelets: 249 10*3/uL (ref 150–400)
RBC: 4.68 MIL/uL (ref 3.87–5.11)
RDW: 13.6 % (ref 11.5–15.5)
WBC: 5.9 10*3/uL (ref 4.0–10.5)
nRBC: 0 % (ref 0.0–0.2)

## 2020-10-13 LAB — GLUCOSE, CAPILLARY
Glucose-Capillary: 80 mg/dL (ref 70–99)
Glucose-Capillary: 118 mg/dL — ABNORMAL HIGH (ref 70–99)
Glucose-Capillary: 58 mg/dL — ABNORMAL LOW (ref 70–99)
Glucose-Capillary: 189 mg/dL — ABNORMAL HIGH (ref 70–99)

## 2020-10-13 LAB — MAGNESIUM: Magnesium: 1.8 mg/dL (ref 1.7–2.4)

## 2020-10-13 LAB — CREATININE, SERUM
Creatinine, Ser: 0.62 mg/dL (ref 0.44–1.00)
GFR, Estimated: >60 mL/min (ref >60)

## 2020-10-13 LAB — POCT ACTIVATED CLOTTING TIME: Activated Clotting Time: 315 seconds

## 2020-10-13 SURGERY — RIGHT/LEFT HEART CATH AND CORONARY ANGIOGRAPHY
Anesthesia: LOCAL

## 2020-10-13 MED ORDER — SODIUM CHLORIDE 0.9% FLUSH
3.0000 mL | Freq: Two times a day (BID) | INTRAVENOUS | Status: DC
  Administered 2020-10-13 – 2020-10-14 (×2): 3 mL via INTRAVENOUS

## 2020-10-13 MED ORDER — SPIRONOLACTONE 12.5 MG HALF TABLET
12.5000 mg | ORAL_TABLET | Freq: Every day | ORAL | Status: DC
  Administered 2020-10-13: 12.5 mg via ORAL
  Filled 2020-10-13: qty 1

## 2020-10-13 MED ORDER — CLOPIDOGREL BISULFATE 300 MG PO TABS
ORAL_TABLET | ORAL | Status: DC | PRN
  Administered 2020-10-13: 600 mg via ORAL

## 2020-10-13 MED ORDER — INSULIN GLARGINE 100 UNIT/ML ~~LOC~~ SOLN
30.0000 [IU] | Freq: Every day | SUBCUTANEOUS | Status: DC
  Administered 2020-10-13 – 2020-10-15 (×3): 30 [IU] via SUBCUTANEOUS
  Filled 2020-10-13 (×3): qty 0.3

## 2020-10-13 MED ORDER — ENOXAPARIN SODIUM 40 MG/0.4ML ~~LOC~~ SOLN
40.0000 mg | SUBCUTANEOUS | Status: DC
  Administered 2020-10-14 – 2020-10-16 (×3): 40 mg via SUBCUTANEOUS
  Filled 2020-10-13 (×3): qty 0.4

## 2020-10-13 MED ORDER — ASPIRIN 81 MG PO CHEW: 81.0000 mg | CHEWABLE_TABLET | ORAL | Status: AC

## 2020-10-13 MED ORDER — CLOPIDOGREL BISULFATE 300 MG PO TABS
ORAL_TABLET | ORAL | Status: AC
  Filled 2020-10-13: qty 1

## 2020-10-13 MED ORDER — LABETALOL HCL 5 MG/ML IV SOLN: 10.0000 mg | INTRAVENOUS | Status: AC | PRN

## 2020-10-13 MED ORDER — DIGOXIN 125 MCG PO TABS
0.1250 mg | ORAL_TABLET | Freq: Every day | ORAL | Status: DC
  Administered 2020-10-13 – 2020-10-16 (×4): 0.125 mg via ORAL
  Filled 2020-10-13 (×4): qty 1

## 2020-10-13 MED ORDER — SODIUM CHLORIDE 0.9 % IV SOLN: 250.0000 mL | INTRAVENOUS | Status: DC | PRN

## 2020-10-13 MED ORDER — FUROSEMIDE 10 MG/ML IJ SOLN
80.0000 mg | Freq: Two times a day (BID) | INTRAMUSCULAR | Status: DC
  Administered 2020-10-13 – 2020-10-16 (×6): 80 mg via INTRAVENOUS
  Filled 2020-10-13 (×6): qty 8

## 2020-10-13 MED ORDER — SODIUM CHLORIDE 0.9% FLUSH
3.0000 mL | Freq: Two times a day (BID) | INTRAVENOUS | Status: DC
  Administered 2020-10-13 – 2020-10-16 (×6): 3 mL via INTRAVENOUS

## 2020-10-13 MED ORDER — HEPARIN (PORCINE) IN NACL 1000-0.9 UT/500ML-% IV SOLN
INTRAVENOUS | Status: AC
  Filled 2020-10-13: qty 1500

## 2020-10-13 MED ORDER — HEPARIN SODIUM (PORCINE) 1000 UNIT/ML IJ SOLN
INTRAMUSCULAR | Status: AC
  Filled 2020-10-13: qty 1

## 2020-10-13 MED ORDER — ACETAMINOPHEN 325 MG PO TABS: 650.0000 mg | ORAL_TABLET | ORAL | Status: DC | PRN

## 2020-10-13 MED ORDER — HEPARIN (PORCINE) IN NACL 1000-0.9 UT/500ML-% IV SOLN
INTRAVENOUS | Status: DC | PRN
  Administered 2020-10-13 (×2): 500 mL

## 2020-10-13 MED ORDER — SODIUM CHLORIDE 0.9 % IV SOLN
INTRAVENOUS | Status: AC | PRN
  Administered 2020-10-13: 10 mL/h via INTRAVENOUS

## 2020-10-13 MED ORDER — SODIUM CHLORIDE 0.9% FLUSH: 3.0000 mL | INTRAVENOUS | Status: DC | PRN

## 2020-10-13 MED ORDER — MIDAZOLAM HCL 2 MG/2ML IJ SOLN
INTRAMUSCULAR | Status: AC
  Filled 2020-10-13: qty 2

## 2020-10-13 MED ORDER — SODIUM CHLORIDE 0.9 % IV SOLN: INTRAVENOUS | Status: DC

## 2020-10-13 MED ORDER — POTASSIUM CHLORIDE CRYS ER 20 MEQ PO TBCR
40.0000 meq | EXTENDED_RELEASE_TABLET | Freq: Every day | ORAL | Status: DC
  Administered 2020-10-13 – 2020-10-15 (×3): 40 meq via ORAL
  Filled 2020-10-13 (×3): qty 2

## 2020-10-13 MED ORDER — POTASSIUM CHLORIDE CRYS ER 20 MEQ PO TBCR
40.0000 meq | EXTENDED_RELEASE_TABLET | Freq: Once | ORAL | Status: AC
  Administered 2020-10-13: 40 meq via ORAL
  Filled 2020-10-13: qty 2

## 2020-10-13 MED ORDER — VERAPAMIL HCL 2.5 MG/ML IV SOLN
INTRAVENOUS | Status: DC | PRN
  Administered 2020-10-13: 2 mg via INTRA_ARTERIAL

## 2020-10-13 MED ORDER — FENTANYL CITRATE (PF) 100 MCG/2ML IJ SOLN
INTRAMUSCULAR | Status: DC | PRN
  Administered 2020-10-13 (×3): 25 ug via INTRAVENOUS

## 2020-10-13 MED ORDER — NITROGLYCERIN 1 MG/10 ML FOR IR/CATH LAB
INTRA_ARTERIAL | Status: AC
  Filled 2020-10-13: qty 10

## 2020-10-13 MED ORDER — FENTANYL CITRATE (PF) 100 MCG/2ML IJ SOLN
INTRAMUSCULAR | Status: AC
  Filled 2020-10-13: qty 2

## 2020-10-13 MED ORDER — CLOPIDOGREL BISULFATE 300 MG PO TABS: ORAL_TABLET | ORAL | Status: DC | PRN

## 2020-10-13 MED ORDER — ASPIRIN 81 MG PO CHEW
81.0000 mg | CHEWABLE_TABLET | Freq: Every day | ORAL | Status: DC
  Administered 2020-10-13 – 2020-10-16 (×4): 81 mg via ORAL
  Filled 2020-10-13 (×4): qty 1

## 2020-10-13 MED ORDER — ASPIRIN 81 MG PO CHEW: 81.0000 mg | CHEWABLE_TABLET | ORAL | Status: DC

## 2020-10-13 MED ORDER — IOHEXOL 350 MG/ML SOLN
INTRAVENOUS | Status: DC | PRN
  Administered 2020-10-13: 100 mL via INTRA_ARTERIAL

## 2020-10-13 MED ORDER — LIDOCAINE HCL (PF) 1 % IJ SOLN
INTRAMUSCULAR | Status: DC | PRN
  Administered 2020-10-13 (×2): 2 mL

## 2020-10-13 MED ORDER — HYDRALAZINE HCL 20 MG/ML IJ SOLN: 10.0000 mg | INTRAMUSCULAR | Status: AC | PRN

## 2020-10-13 MED ORDER — VERAPAMIL HCL 2.5 MG/ML IV SOLN
INTRAVENOUS | Status: AC
  Filled 2020-10-13: qty 2

## 2020-10-13 MED ORDER — MIDAZOLAM HCL 2 MG/2ML IJ SOLN
INTRAMUSCULAR | Status: DC | PRN
  Administered 2020-10-13 (×4): 1 mg via INTRAVENOUS

## 2020-10-13 MED ORDER — CARVEDILOL 3.125 MG PO TABS
3.1250 mg | ORAL_TABLET | Freq: Two times a day (BID) | ORAL | Status: DC
  Administered 2020-10-13 – 2020-10-16 (×6): 3.125 mg via ORAL
  Filled 2020-10-13 (×6): qty 1

## 2020-10-13 MED ORDER — LIDOCAINE HCL (PF) 1 % IJ SOLN
INTRAMUSCULAR | Status: AC
  Filled 2020-10-13: qty 30

## 2020-10-13 MED ORDER — HEPARIN SODIUM (PORCINE) 1000 UNIT/ML IJ SOLN
INTRAMUSCULAR | Status: DC | PRN
  Administered 2020-10-13: 6000 [IU] via INTRAVENOUS
  Administered 2020-10-13: 5500 [IU] via INTRAVENOUS

## 2020-10-13 MED ORDER — ONDANSETRON HCL 4 MG/2ML IJ SOLN: 4.0000 mg | Freq: Four times a day (QID) | INTRAMUSCULAR | Status: DC | PRN

## 2020-10-13 MED ORDER — VERAPAMIL HCL 2.5 MG/ML IV SOLN
INTRAVENOUS | Status: DC | PRN
  Administered 2020-10-13: 10 mL via INTRA_ARTERIAL

## 2020-10-13 MED ORDER — CLOPIDOGREL BISULFATE 75 MG PO TABS
75.0000 mg | ORAL_TABLET | Freq: Every day | ORAL | Status: DC
  Administered 2020-10-14 – 2020-10-16 (×3): 75 mg via ORAL
  Filled 2020-10-13 (×3): qty 1

## 2020-10-13 MED ORDER — IOHEXOL 350 MG/ML SOLN
INTRAVENOUS | Status: DC | PRN
  Administered 2020-10-13: 20 mL via INTRA_ARTERIAL

## 2020-10-13 SURGICAL SUPPLY — 23 items
BALLN SAPPHIRE 2.0X12 (BALLOONS) ×2
BALLN SAPPHIRE ~~LOC~~ 3.25X18 (BALLOONS) ×1 IMPLANT
BALLOON SAPPHIRE 2.0X12 (BALLOONS) IMPLANT
CATH 5FR JL3.5 JR4 ANG PIG MP (CATHETERS) ×1 IMPLANT
CATH BALLN WEDGE 5F 110CM (CATHETERS) ×1 IMPLANT
CATH INFINITI 5 FR 3DRC (CATHETERS) ×1 IMPLANT
CATH LAUNCHER 5F JL4 (CATHETERS) IMPLANT
CATH VISTA GUIDE 6FR XBLAD3.5 (CATHETERS) ×1 IMPLANT
CATHETER LAUNCHER 5F JL4 (CATHETERS) ×2
DEVICE RAD COMP TR BAND LRG (VASCULAR PRODUCTS) ×1 IMPLANT
GLIDESHEATH SLEND SS 6F .021 (SHEATH) ×1 IMPLANT
GUIDEWIRE INQWIRE 1.5J.035X260 (WIRE) IMPLANT
INQWIRE 1.5J .035X260CM (WIRE) ×2
KIT ENCORE 26 ADVANTAGE (KITS) ×1 IMPLANT
KIT HEART LEFT (KITS) ×1 IMPLANT
PACK CARDIAC CATHETERIZATION (CUSTOM PROCEDURE TRAY) ×2 IMPLANT
SHEATH GLIDE SLENDER 4/5FR (SHEATH) ×1 IMPLANT
SHEATH PROBE COVER 6X72 (BAG) ×1 IMPLANT
STENT SYNERGY XD 3.0X28 (Permanent Stent) IMPLANT
SYNERGY XD 3.0X28 (Permanent Stent) ×2 IMPLANT
TRANSDUCER W/STOPCOCK (MISCELLANEOUS) ×2 IMPLANT
TUBING CIL FLEX 10 FLL-RA (TUBING) ×1 IMPLANT
WIRE COUGAR XT STRL 190CM (WIRE) ×1 IMPLANT

## 2020-10-13 NOTE — Progress Notes (Signed)
Pt partner given updates and packed up pt belongings and went to Cath waiting area. Pt had cell phone going to the cath lab.

## 2020-10-13 NOTE — Consult Note (Addendum)
Advanced Heart Failure Team Consult Note   Primary Physician: [name], NP PCP-Cardiologist:  No primary care provider on file.  Reason for Consultation: heart failure  HPI:    Catherine Espinoza is a 45 y.o. F PMH: T2DM, HTN recently off medication due to financial reasons with BMI 40 presented to ED on 10/11/20 for leg swelling and pain.  Was being evaluated outpatient had a negative doppler for DVT and started on doxycycline for possible cellulitis and lasix 20 mg PRN to help with swelling.  The ED obtained CT which showed no PE but small pericardial effusion/thickening, reflux of CTX into hepatic veins with moderate opacities in both lungs, dilated main pulmonary artery with R axillary lymphadenopathy. The patient was admitted to Hospitalist and started on lasix 40 IV BID and doxycycline was continued for axillary abscess. Cardiology was consulted recommended continue lasix IV BID and echocardiogram showed severe LV dysfunction.  Today spironolactone 12.5 mg was started and plan for LHC/RHC today.   Catherine Espinoza is reports at the end of November started to notice some swelling in her legs.  She also reports some shortness of breath but only at night when she was laying down to sleep.  She denies chest pain.  Does report family history of cardiac disease (Maternal Aunt died when she was 31).  Denies alcohol and drug use, but smokes about pack per day. She reports off her medication (lantus 65, lisinopril and norvasc) due to not having insurance and the cost of appt and medication was too expensive.         Catherine Espinoza is seen today for evaluation of heart failure at the request of Cardiology.   Of note in 2012 was being evaluated and felt her dyspnea was related to obesity hypoventilation syndrome. She has lost 100 lbs since evaluation reporting improvement in dyspnea.    Review of Systems: Review of Systems  Constitutional: Positive for weight loss. Negative for chills and fever.   Respiratory:       Orthopnea  Cardiovascular: Positive for leg swelling. Negative for chest pain.  Gastrointestinal: Negative for nausea and vomiting.  Skin:       States has hidradenitis  Neurological: Negative for weakness.     Home Medications Prior to Admission medications   Medication Sig Start Date End Date Taking? Authorizing Provider  atorvastatin (LIPITOR) 40 MG tablet Take 1 tablet (40 mg total) by mouth daily. 10/03/20  Yes [name], NP  doxycycline (VIBRA-TABS) 100 MG tablet Take 1 tablet (100 mg total) by mouth 2 (two) times daily. 10/03/20  Yes [name], NP  furosemide (LASIX) 20 MG tablet Take 1 tablet (20 mg total) by mouth daily as needed. Patient taking differently: Take 20 mg by mouth daily as needed for fluid or edema. 10/03/20  Yes [name], NP  Insulin Glargine-yfgn 100 UNIT/ML SOPN Inject 65 Units into the skin daily. 10/04/20  Yes [provider]  lisinopril (ZESTRIL) 20 MG tablet Take 1 tablet (20 mg total) by mouth daily. 10/03/20  Yes [name], NP  valACYclovir (VALTREX) 1000 MG tablet Take 1 tablet by mouth once daily for 5 days at start of rash 10/03/20  Yes [name], NP  BAYER MICROLET LANCETS lancets Use as instructed to check blood sugar three times daily.  DX  E11.65 Patient taking differently: Use as instructed to check blood sugar three times daily.  DX  E11.65 10/24/17   [name], NP  blood glucose meter kit and supplies  KIT Dispense based on patient and insurance preference. Use up to four times daily as directed. (FOR ICD-9 250.00, 250.01). 10/03/20   [name], NP  Continuous Blood Gluc Receiver (FREESTYLE LIBRE 2 READER) DEVI Use as directed 10/10/20   [name], NP  Continuous Blood Gluc Sensor (FREESTYLE LIBRE 2 SENSOR) MISC Use as directed 10/10/20   [name], NP  Continuous Blood Gluc Sensor (Nectar) MISC Apply every 14 days. 10/03/20    [name], NP  glucose blood (BAYER CONTOUR TEST) test strip Use to check blood sugar 3 times per day.  DX  E11.65 Patient taking differently: Use to check blood sugar 3 times per day.  DX  E11.65 10/24/17   [name], NP  Insulin Glargine (BASAGLAR KWIKPEN) 100 UNIT/ML Inject 65 Units into the skin daily. Patient not taking: Reported on 10/12/2020 10/04/20   [name], NP  Insulin Syringe 27G X 1/2" 0.5 ML MISC Use as directed 03/16/20   [name], NP    Past Medical History: Past Medical History:  Diagnosis Date  . Anemia, unspecified   . Chronic left-sided thoracic back pain   . Excessive or frequent menstruation   . Morbid obesity ([place])   . Obstructive sleep apnea 02/11/11   Sleep study: severe OSA- rec CPAP 20cm small  full face mask  . Proteinuria   . Thoracic radiculopathy   . Type II or unspecified type diabetes mellitus without mention of complication, not stated as uncontrolled   . Unspecified essential hypertension     Past Surgical History: Past Surgical History:  Procedure Laterality Date  . ABDOMINAL HYSTERECTOMY    . ABLATION COLPOCLESIS    . CESAREAN SECTION    . CLEFT PALATE REPAIR    . cyst removal    . ECTOPIC PREGNANCY SURGERY     x 2  . OOPHORECTOMY Right 2002    Family History: Family History  Problem Relation Age of Onset  . Heart attack Maternal Aunt   . Diabetes Mother   . Cancer Father        oral cancer    Social History: Social History   Socioeconomic History  . Marital status: Single    Spouse name: Not on file  . Number of children: 1  . Years of education: Not on file  . Highest education level: Associate degree: occupational, Hotel manager, or vocational program  Occupational History  . Occupation: cna/med Engineer, production: [employer]  Tobacco Use  . Smoking status: Current Every Day Smoker    Packs/day: 0.50    Years: 10.00    Pack years: 5.00    Types: Cigarettes  . Smokeless tobacco:  Never Used  Vaping Use  . Vaping Use: Never used  Substance and Sexual Activity  . Alcohol use: Yes    Alcohol/week: 0.0 standard drinks    Comment: occasional use  . Drug use: No  . Sexual activity: Yes    Birth control/protection: Surgical  Other Topics Concern  . 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 Difficulty of Paying Living Expenses: Hard  Food Insecurity: No Food Insecurity  . Worried About Charity fundraiser in the Last Year: Never true  . Ran Out of Food in the Last Year: Never true  Transportation Needs: No Transportation Needs  .  Lack of Transportation (Medical): No  . Lack of Transportation (Non-Medical): No  Physical Activity: Not on file  Stress: Not on file  Social Connections: Not on file    Allergies:  Allergies  Allergen Reactions  . Ibuprofen     REACTION: knots in mouth with SOB  . Labetalol Nausea Only  . Aspirin     Knots in mouth     Objective:    Vital Signs:   Temp:  [97.7 F (36.5 C)-98.3 F (36.8 C)] 98.3 F (36.8 C) (01/14 0518) Pulse Rate:  [83-101] 83 (01/14 0518) Resp:  [16-18] 16 (01/14 0518) BP: (132-146)/(80-90) 146/85 (01/14 0518) SpO2:  [92 %-94 %] 92 % (01/14 0518) Weight:  [110.7 kg] 110.7 kg (01/14 0518) Last BM Date: 10/12/20  Weight change: Filed Weights   10/11/20 2155 10/12/20 0613 10/13/20 0518  Weight: 113.3 kg 112 kg 110.7 kg    Intake/Output:   Intake/Output Summary (Last 24 hours) at 10/13/2020 1011 Last data filed at 10/13/2020 0700 Gross per 24 hour  Intake 220 ml  Output 2530 ml  Net -2310 ml      Physical Exam    General:  Well appearing. No resp difficulty HEENT: normal Neck: supple. No obvious JVD.  Cor: PMI nondisplaced. Regular rate & rhythm. No rubs, gallops or murmurs. Lungs: Clear bilaterally.  Abdomen: soft, nontender,  nondistended. No hepatosplenomegaly. No bruits or masses. Good bowel sounds. Extremities: no cyanosis, clubbing, rash.  RLE edema to thigh. B/L ankle edema. Neuro: alert & oriented x 3, cranial nerves grossly intact. moves all 4 extremities w/o difficulty. Affect pleasant.  Purulent drainage to R groin, R axillary.  Wound to L breast.    Telemetry   NSR rates 80-90s.  Personally reviewed.   EKG    No new EKG to review.    Labs   Basic Metabolic Panel: Recent Labs  Lab 10/11/20 1128 10/12/20 0145 10/13/20 0427  NA 133* 136 139  K 3.8 3.7 3.7  CL 100 101 101  CO2 _0 GLUCOSE 334* 189* 63*  BUN _1 CREATININE 0.60 0.67 0.61  CALCIUM 8.7* 8.3* 8.7*  MG  --  1.3* 1.8    Liver Function Tests: Recent Labs  Lab 10/12/20 0145  AST 11*  ALT 20  ALKPHOS 84  BILITOT 0.5  PROT 5.9*  ALBUMIN 2.0*   No results for input(s): LIPASE, AMYLASE in the last 168 hours. No results for input(s): AMMONIA in the last 168 hours.  CBC: Recent Labs  Lab 10/11/20 1128 10/12/20 0145  WBC 11.8* 10.0  NEUTROABS 8.1* 5.8  HGB 14.3 13.4  HCT 43.6 40.8  MCV 90.6 91.1  PLT 235 214    Cardiac Enzymes: No results for input(s): CKTOTAL, CKMB, CKMBINDEX, TROPONINI in the last 168 hours.  BNP: BNP (last 3 results) Recent Labs    10/11/20 1128  BNP 501.2*    ProBNP (last 3 results) No results for input(s): PROBNP in the last 8760 hours.   CBG: Recent Labs  Lab 10/12/20 0610 10/12/20 1246 10/12/20 1612 10/12/20 2100 10/13/20 0548  GLUCAP 181* 162* 111* 81 80    Coagulation Studies: No results for input(s): LABPROT, INR in the last 72 hours.   Imaging   . VAS [place] ABI WITH/WO TBI  Result Date: 10/12/2020 LOWER EXTREMITY DOPPLER STUDY Indications: Rest pain. High Risk Factors: Hypertension, hyperlipidemia, Diabetes.  Comparison Study: No prior studies. Performing Technologist: Carlos Levering RVT  Examination Guidelines: A complete evaluation  includes at minimum,  Doppler waveform signals and systolic blood pressure reading at the level of bilateral brachial, anterior tibial, and posterior tibial arteries, when vessel segments are accessible. Bilateral testing is considered an integral part of a complete examination. Photoelectric Plethysmograph (PPG) waveforms and toe systolic pressure readings are included as required and additional duplex testing as needed. Limited examinations for reoccurring indications may be performed as noted.  ABI Findings: +---------+------------------+-----+---------+--------+ Right    Rt Pressure (mmHg)IndexWaveform Comment  +---------+------------------+-----+---------+--------+ Brachial 148                    triphasic         +---------+------------------+-----+---------+--------+ PTA      149               1.01 triphasic         +---------+------------------+-----+---------+--------+ DP       146               0.99 triphasic         +---------+------------------+-----+---------+--------+ Great Toe140               0.95                   +---------+------------------+-----+---------+--------+ +---------+------------------+-----+---------+-------+ Left     Lt Pressure (mmHg)IndexWaveform Comment +---------+------------------+-----+---------+-------+ Brachial 145                    triphasic        +---------+------------------+-----+---------+-------+ PTA      108               0.73 biphasic         +---------+------------------+-----+---------+-------+ DP       103               0.70 biphasic         +---------+------------------+-----+---------+-------+ Great Toe91                0.61                  +---------+------------------+-----+---------+-------+ +-------+-----------+-----------+------------+------------+ ABI/TBIToday's ABIToday's TBIPrevious ABIPrevious TBI +-------+-----------+-----------+------------+------------+ Right  1.01       0.95                                 +-------+-----------+-----------+------------+------------+ Left   0.73       0.61                                +-------+-----------+-----------+------------+------------+  Summary: Right: Resting right ankle-brachial index is within normal range. No evidence of significant right lower extremity arterial disease. The right toe-brachial index is normal. Left: Resting left ankle-brachial index indicates moderate left lower extremity arterial disease. The left toe-brachial index is abnormal.  *See table(s) above for measurements and observations.     Preliminary    ECHOCARDIOGRAM COMPLETE  Result Date: 10/12/2020    ECHOCARDIOGRAM REPORT   Patient Name:   Catherine Espinoza Date of Exam: 10/12/2020 Medical Rec #:  [id number]      Height:       65.0 in Accession #:    [id number]     Weight:       247.0 lb Date of Birth:  [birth year]      BSA:          2.164 m Patient Age:    65 years  BP:           133/96 mmHg Patient Gender: F              HR:           98 bpm. Exam Location:  Inpatient Procedure: 2D Echo, Cardiac Doppler, Color Doppler and Intracardiac            Opacification Agent Indications:    CHF  History:        Patient has prior history of Echocardiogram examinations. Risk                 Factors:Hypertension, Sleep Apnea, Diabetes and Current Smoker.  Sonographer:    Clayton Lefort RDCS (AE) Referring Phys: [place]  1. Left ventricular ejection fraction, by estimation, is 20 to 25%. The left ventricle has severely decreased function. The left ventricle demonstrates global hypokinesis. There is mild left ventricular hypertrophy. Left ventricular diastolic parameters  are consistent with Grade II diastolic dysfunction (pseudonormalization). Elevated left atrial pressure.  2. Right ventricular systolic function is mildly reduced. The right ventricular size is normal. There is moderately elevated pulmonary artery systolic pressure. The estimated right ventricular systolic  pressure is 03.2 mmHg.  3. A small pericardial effusion is present. The pericardial effusion is circumferential.  4. The mitral valve is normal in structure. Trivial mitral valve regurgitation.  5. The aortic valve was not well visualized. Aortic valve regurgitation is trivial. No aortic stenosis is present.  6. The inferior vena cava is dilated in size with <50% respiratory variability, suggesting right atrial pressure of 15 mmHg. FINDINGS  Left Ventricle: Left ventricular ejection fraction, by estimation, is 20 to 25%. The left ventricle has severely decreased function. The left ventricle demonstrates global hypokinesis. Definity contrast agent was given IV to delineate the left ventricular endocardial borders. The left ventricular internal cavity size was normal in size. There is mild left ventricular hypertrophy. Left ventricular diastolic parameters are consistent with Grade II diastolic dysfunction (pseudonormalization). Elevated left atrial pressure. Right Ventricle: The right ventricular size is normal. Right vetricular wall thickness was not well visualized. Right ventricular systolic function is mildly reduced. There is moderately elevated pulmonary artery systolic pressure. The tricuspid regurgitant velocity is 3.15 m/s, and with an assumed right atrial pressure of 15 mmHg, the estimated right ventricular systolic pressure is 12.2 mmHg. Left Atrium: Left atrial size was normal in size. Right Atrium: Right atrial size was normal in size. Pericardium: A small pericardial effusion is present. The pericardial effusion is circumferential. Mitral Valve: The mitral valve is normal in structure. Trivial mitral valve regurgitation. MV peak gradient, 7.2 mmHg. The mean mitral valve gradient is 3.0 mmHg. Tricuspid Valve: The tricuspid valve is normal in structure. Tricuspid valve regurgitation is trivial. Aortic Valve: The aortic valve was not well visualized. Aortic valve regurgitation is trivial. No aortic  stenosis is present. Aortic valve mean gradient measures 4.0 mmHg. Aortic valve peak gradient measures 8.5 mmHg. Aortic valve area, by VTI measures 1.59 cm. Pulmonic Valve: The pulmonic valve was not well visualized. Pulmonic valve regurgitation is not visualized. Aorta: The aortic root and ascending aorta are structurally normal, with no evidence of dilitation. Venous: The inferior vena cava is dilated in size with less than 50% respiratory variability, suggesting right atrial pressure of 15 mmHg. IAS/Shunts: No atrial level shunt detected by color flow Doppler.  LEFT VENTRICLE PLAX 2D LVIDd:         4.90 cm  Diastology LVIDs:  4.40 cm  LV e' medial:    6.30 cm/s LV PW:         1.30 cm  LV E/e' medial:  17.9 LV IVS:        1.10 cm  LV e' lateral:   8.10 cm/s LVOT diam:     2.00 cm  LV E/e' lateral: 14.0 LV SV:         34 LV SV Index:   16 LVOT Area:     3.14 cm  RIGHT VENTRICLE            IVC RV Basal diam:  3.30 cm    IVC diam: 2.10 cm RV S prime:     7.35 cm/s TAPSE (M-mode): 1.6 cm LEFT ATRIUM             Index       RIGHT ATRIUM           Index LA diam:        3.70 cm 1.71 cm/m  RA Area:     18.60 cm LA Vol (A2C):   56.1 ml 25.93 ml/m RA Volume:   49.00 ml  22.65 ml/m LA Vol (A4C):   75.3 ml 34.80 ml/m LA Biplane Vol: 69.5 ml 32.12 ml/m  AORTIC VALVE AV Area (Vmax):    1.53 cm AV Area (Vmean):   1.54 cm AV Area (VTI):     1.59 cm AV Vmax:           146.00 cm/s AV Vmean:          90.200 cm/s AV VTI:            0.216 m AV Peak Grad:      8.5 mmHg AV Mean Grad:      4.0 mmHg LVOT Vmax:         71.30 cm/s LVOT Vmean:        44.200 cm/s LVOT VTI:          0.109 m LVOT/AV VTI ratio: 0.50  AORTA Ao Root diam: 3.00 cm Ao Asc diam:  3.40 cm MITRAL VALVE                TRICUSPID VALVE MV Area (PHT): 4.41 cm     TR Peak grad:   39.7 mmHg MV Area VTI:   1.24 cm     TR Vmax:        315.00 cm/s MV Peak grad:  7.2 mmHg MV Mean grad:  3.0 mmHg     SHUNTS MV Vmax:       1.34 m/s     Systemic VTI:  0.11 m MV  Vmean:      71.3 cm/s    Systemic Diam: 2.00 cm MV Decel Time: 172 msec MV E velocity: 113.00 cm/s MV A velocity: 64.00 cm/s MV E/A ratio:  1.77 Oswaldo Milian MD Electronically signed by Oswaldo Milian MD Signature Date/Time: 10/12/2020/6:17:52 PM    Final       Medications:     Current Medications: . . aspirin  81 mg Oral Daily  . atorvastatin  40 mg Oral Daily  . carvedilol  3.125 mg Oral BID WC  . digoxin  0.125 mg Oral Daily  . doxycycline  100 mg Oral Q12H  . enoxaparin (LOVENOX) injection  40 mg Subcutaneous Q24H  . furosemide  40 mg Intravenous BID  . insulin aspart  0-9 Units Subcutaneous TID WC  . insulin aspart  6 Units Subcutaneous TID WC  .  insulin glargine  30 Units Subcutaneous Daily  . losartan  100 mg Oral Daily  . nicotine  21 mg Transdermal Daily  . sodium chloride flush  3 mL Intravenous Q12H  . spironolactone  12.5 mg Oral Daily     Infusions: . . sodium chloride    . sodium chloride 10 mL/hr at 10/13/20 0539       Patient Profile   Catherine Espinoza is a 45 y.o. F PMH: T2DM, HTN recently off medication due to financial reasons with BMI 40 presented to ED on 10/11/20 for leg swelling and pain. Admitted for new onset of HFrEF.    Assessment/Plan   1.  Acute on HFrEF -Echo this admission shows EF 20-25% with global hypokinesis, mild LVH, grade I DD, RVSP 54, small pericardial effusion and IVC dilated w/ RAP 15. HS trop 18 -> 18, BNP 501, CT showing  Small pericardial effusion, reflux in hepatic veins with moderate opacities in both lungs, dilated main pulmonary artery.  Wt on admission 113 kg, June 2021 wt 90.7 kg, today 110.7 kg;Total output -3.4L -Cardiology started spironolactone 12.5 mg daily, coreg 3.125 mg BID, digoxin 0.125 mg and losartan was continue with goal to transition to [employer] tomorrow -Going for cath today -Will increase lasix from 40 IV BID to 80 IV BID.   -ASA and atorvastatin    2.  Uncontrolled T2DM -A1c 13.5%  (previously 9.6%) -SSI with novolog 6 units w/ meals and lantus 30units daily  3. Hypertension -SBP 130-140s -c/w losartan, coreg, spironolactone  4.  Obesity -BMI 40.6  5.  Hypokalemia -started PO KCl- with IV lasix  6.  Hidradenitis -continue with doxycycline  7.  Tobacco use -nicotine patch  Medication concerns reviewed with patient and pharmacy team. Barriers identified: Has missed appt in 2012 and not on meds recently due to finances.  Currently employed with insurance.   Length of Stay: 2  Carlene Coria, NP  10/13/2020, 10:11 AM  Advanced Heart Failure Team Pager [phone] (M-F; 7a - 4p)  Please contact [facility] for night-coverage after hours (4p -7a ) and weekends on [email or web address]  Patient seen and examined with the above-signed Advanced Practice Provider and/or Housestaff. I personally reviewed laboratory data, imaging studies and relevant notes. I independently examined the patient and formulated the important aspects of the plan. I have edited the note to reflect any of my changes or salient points. I have personally discussed the plan with the patient and/or family.  45 y/o with DM2, morbid obesity, tobacco use, HTN admitted with acute systolic HF. EF 20%   Cath today with 2v CAD and markedly elevated filling pressures. Received LAD stent.   General:  Obese woman No resp difficulty HEENT: normal Neck: supple. JVP to jaw Carotids 2+ bilat; no bruits. No lymphadenopathy or thryomegaly appreciated. Cor: PMI nondisplaced. Regular rate & rhythm. No rubs, gallops or murmurs. Lungs: + basilar crackles Abdomen: obese soft, nontender, nondistended. No hepatosplenomegaly. No bruits or masses. Good bowel sounds. Extremities: no cyanosis, clubbing, rash, 2+ edema Neuro: alert & orientedx3, cranial nerves grossly intact. moves all 4 extremities w/o difficulty. Affect pleasant  Severe mixed ischemic/nonischemic CM (LV dysfunction out of proportion to CAD). Now s/p LAD  stent. Will need aggressive diuresis and titration of GDMT. D/w Dr. Stanford Breed. He will see her over the weekend and HF team will assume care on Monday.   Glori Bickers, MD  3:53 PM

## 2020-10-13 NOTE — Progress Notes (Signed)
CBG  On admission to the unit  from Cath lab at 58. Pt wanted to eat cherrios and bananas + milk. Allowed to eat at this time. Will recheck CBG.

## 2020-10-13 NOTE — Interval H&P Note (Signed)
History and Physical Interval Note:  10/13/2020 1:16 PM  Catherine Espinoza  has presented today for surgery, with the diagnosis of CHF.  The various methods of treatment have been discussed with the patient and family. After consideration of risks, benefits and other options for treatment, the patient has consented to  Procedure(s): RIGHT/LEFT HEART CATH AND CORONARY ANGIOGRAPHY (N/A) and possible coronary angioplasty as a surgical intervention.  The patient's history has been reviewed, patient examined, no change in status, stable for surgery.  I have reviewed the patient's chart and labs.  Questions were answered to the patient's satisfaction.     [name]

## 2020-10-13 NOTE — Progress Notes (Signed)
PROGRESS NOTE    Catherine Espinoza  [id number] DOB: 26-Oct-1975 DOA: 10/11/2020 PCP: [name], NP    Brief Narrative:  Catherine Espinoza is a 45 y.o. female with history of diabetes mellitus type 2, morbid obesity, hypertension who has not been compliant with her medications due to patient having financial issues has been experiencing increasing swelling in the lower extremities more on the right side with pain for which patient's primary care physician had placed her on doxycycline despite taking which the swelling and pain has not improved.  And patient presents to the ER at [facility].  Denies fever chills chest pain shortness of breath nausea vomiting or diarrhea.  Patient states she may have lost weight over the last 1 year. Acute systolic heart failure and pulmonary hyperteinsion: 2D echo with EF of 20-25%. HF Cardiology team following. Remains on IV Lasix for diuresis.  Underwent Heart cath with PTCA/DES x1 of mid LAD. Will need to continue DAPT ASA and Plavix for at least 6 months.    Assessment & Plan:   Principal Problem:   CHF, acute ([place]) Active Problems:   Diabetes type 2, uncontrolled ([place])   MORBID OBESITY   TOBACCO ABUSE   Hypertension   Acute CHF (congestive heart failure) (HCC)   Coronary artery disease involving native coronary artery of native heart with unstable angina pectoris (HCC)  Acute systolic HF Small pericardial effusion NICM and ICM CAD s/p PCI to mid LAD With EF of <20% with severe ischemic and NICM per cardiac cath on 1/14.  Starting BB, spironolactone, ARB, digoxin.On statin, ASA, and not Plavix post PCI.  Will start entresto tomorrow.  Appreciate HF team following.  Will need evaluation for ICD.   DM2 Hgb A1c of 13.5, uncontrolled Had hypoglycemia this afternoon after cardiac cath but overall blood sugars had been stable on Lantus 30 units daily, lispro 6 units w meals, and SSI.  Will need diabetes education.   HTN BP  stable, continue to monitor with newly started ARB, BB, spironolactone.    Hypokalemia Potassium lower this afternoon Will provide repletion, repeat labs in am, check magnesium, replete as needed.   Tobacco use Continue nicotine patch.   Hidradenitis  Continue doxycycline, has axillary abscess. Monitor.    Morbid obesity BMI 40.6 Continue lifestyle modification counseling.   DVT prophylaxis: Lovenox Code Status: Full Family Communication: SO at the bedside Disposition Plan:  Patient from home Anticipated dc to home No medically ready for dc Will be ready for dc in >3 days.    Consultants:   Cardiology, HF  Procedures:  1/14 Heart Cath with PTCA/DES x1 of mid LAD Antimicrobials:  doxycycline  Subjective: Patient seen prior to cardiac cath, complained of feeling anxious abut the upcoming cardiac cath. Denied chest pain or dyspnea.   Objective:  Intake/Output Summary (Last 24 hours) at 10/13/2020 1756 Last data filed at 10/13/2020 1317 Gross per 24 hour  Intake 220 ml  Output 3025 ml  Net -2805 ml   Filed Weights   10/11/20 2155 10/12/20 0613 10/13/20 0518  Weight: 113.3 kg 112 kg 110.7 kg    Examination:  General exam: Sitting up in bed, NAD. Respiratory system: No cough. Respiratory effort normal. Cardiovascular system: S1 & S2 preset, RRR. BL LE edema Gastrointestinal system: Abdomen is nondistended, soft and nontender.  Central nervous system: Alert and oriented. No focal neurological deficits. Extremities: Symmetric 5 x 5 power. Skin: No rashes, lesions or ulcers Psychiatry: Mood & affect appropriate.  Data Reviewed: I have personally reviewed following labs and imaging studies and reports  CBC: Recent Labs  Lab 10/11/20 1128 10/12/20 0145 10/13/20 1404 10/13/20 1410 10/13/20 1639  WBC 11.8* 10.0  --   --  5.9  NEUTROABS 8.1* 5.8  --   --   --   HGB 14.3 13.4 13.6 12.6  13.6 13.8  HCT 43.6 40.8 40.0 37.0  40.0 42.1  MCV 90.6 91.1   --   --  90.0  PLT 235 214  --   --  0000000   Basic Metabolic Panel: Recent Labs  Lab 10/11/20 1128 10/12/20 0145 10/13/20 0427 10/13/20 1404 10/13/20 1410 10/13/20 1639  NA 133* 136 139 142 144  142  --   K 3.8 3.7 3.7 3.5 2.8*  3.4*  --   CL 100 101 101  --   --   --   CO2 25 26 26   --   --   --   GLUCOSE 334* 189* 63*  --   --   --   BUN 9 8 14   --   --   --   CREATININE 0.60 0.67 0.61  --   --  0.62  CALCIUM 8.7* 8.3* 8.7*  --   --   --   MG  --  1.3* 1.8  --   --   --    GFR: Estimated Creatinine Clearance: 111.2 mL/min (by C-G formula based on SCr of 0.62 mg/dL). Liver Function Tests: Recent Labs  Lab 10/12/20 0145  AST 11*  ALT 20  ALKPHOS 84  BILITOT 0.5  PROT 5.9*  ALBUMIN 2.0*   No results for input(s): LIPASE, AMYLASE in the last 168 hours. No results for input(s): AMMONIA in the last 168 hours. Coagulation Profile: No results for input(s): INR, PROTIME in the last 168 hours. Cardiac Enzymes: No results for input(s): CKTOTAL, CKMB, CKMBINDEX, TROPONINI in the last 168 hours. BNP (last 3 results) No results for input(s): PROBNP in the last 8760 hours. HbA1C: No results for input(s): HGBA1C in the last 72 hours. CBG: Recent Labs  Lab 10/12/20 1612 10/12/20 2100 10/13/20 0548 10/13/20 1128 10/13/20 1612  GLUCAP 111* 81 80 118* 58*   Lipid Profile: No results for input(s): CHOL, HDL, LDLCALC, TRIG, CHOLHDL, LDLDIRECT in the last 72 hours. Thyroid Function Tests: Recent Labs    10/12/20 0145  TSH 1.477   Anemia Panel: No results for input(s): VITAMINB12, FOLATE, FERRITIN, TIBC, IRON, RETICCTPCT in the last 72 hours. Sepsis Labs: No results for input(s): PROCALCITON, LATICACIDVEN in the last 168 hours.  Recent Results (from the past 240 hour(s))  Resp Panel by RT-PCR (Flu A&B, Covid) Nasopharyngeal Swab     Status: None   Collection Time: 10/11/20  5:24 PM   Specimen: Nasopharyngeal Swab; Nasopharyngeal(NP) swabs in vial transport medium   Result Value Ref Range Status   SARS Coronavirus 2 by RT PCR NEGATIVE NEGATIVE Final    Comment: (NOTE) SARS-CoV-2 target nucleic acids are NOT DETECTED.  The SARS-CoV-2 RNA is generally detectable in upper respiratory specimens during the acute phase of infection. The lowest concentration of SARS-CoV-2 viral copies this assay can detect is 138 copies/mL. A negative result does not preclude SARS-Cov-2 infection and should not be used as the sole basis for treatment or other patient management decisions. A negative result may occur with  improper specimen collection/handling, submission of specimen other than nasopharyngeal swab, presence of viral mutation(s) within the areas targeted by this assay, and  inadequate number of viral copies(<138 copies/mL). A negative result must be combined with clinical observations, patient history, and epidemiological information. The expected result is Negative.  Fact Sheet for Patients:  EntrepreneurPulse.com.au  Fact Sheet for Healthcare Providers:  IncredibleEmployment.be  This test is no t yet approved or cleared by the [place] FDA and  has been authorized for detection and/or diagnosis of SARS-CoV-2 by FDA under an Emergency Use Authorization (EUA). This EUA will remain  in effect (meaning this test can be used) for the duration of the COVID-19 declaration under Section 564(b)(1) of the Act, 21 U.S.C.section 360bbb-3(b)(1), unless the authorization is terminated  or revoked sooner.       Influenza A by PCR NEGATIVE NEGATIVE Final   Influenza B by PCR NEGATIVE NEGATIVE Final    Comment: (NOTE) The Xpert Xpress SARS-CoV-2/FLU/RSV plus assay is intended as an aid in the diagnosis of influenza from Nasopharyngeal swab specimens and should not be used as a sole basis for treatment. Nasal washings and aspirates are unacceptable for Xpert Xpress SARS-CoV-2/FLU/RSV testing.  Fact Sheet for  Patients: EntrepreneurPulse.com.au  Fact Sheet for Healthcare Providers: IncredibleEmployment.be  This test is not yet approved or cleared by the [place] FDA and has been authorized for detection and/or diagnosis of SARS-CoV-2 by FDA under an Emergency Use Authorization (EUA). This EUA will remain in effect (meaning this test can be used) for the duration of the COVID-19 declaration under Section 564(b)(1) of the Act, 21 U.S.C. section 360bbb-3(b)(1), unless the authorization is terminated or revoked.  Performed at [facility], [address], [place], [place] [address]          Radiology Studies: CARDIAC CATHETERIZATION  Result Date: 10/13/2020  Mid LAD-1 lesion is 40% stenosed.  Mid LAD-2 lesion is 80% stenosed.  A drug-eluting stent was successfully placed using a SYNERGY XD [id number].  Post intervention, there is a 0% residual stenosis.  Post intervention, there is a 0% residual stenosis.  1. Severe mid LAD stenosis 2. Successful PTCA/DES x 1 mid LAD. Recommendations: Continue DAPT with ASA and Plavix for at least six months.   CARDIAC CATHETERIZATION  Result Date: 10/13/2020  Prox RCA lesion is 30% stenosed.  RPDA lesion is 95% stenosed.  RPAV lesion is 99% stenosed.  Prox LAD to Mid LAD lesion is 40% stenosed.  Mid LAD lesion is 80% stenosed.  2nd Diag lesion is 50% stenosed.  Findings: Ao = 147/97 (119) LV =  140/27 RA =  12 RV = 68/15 PA = 72/37 (51) PCW = 34 Fick cardiac output/index = 6.3/2.9 PVR = 2.6 WU FA sat = 97% PA sat = 72%, 72% Assessment: 1. 2v CAD with high grade lesions in the mid LAD and distal RCA 2. 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 Markedly elevated filling pressures with normal cardiac output Plan/Discussion: Plan PCI of LAD followed by aggressive diuresis and titration of GDMT. Glori Bickers, MD 2:57 PM   VAS [place] ABI WITH/WO TBI  Result Date: 10/13/2020 LOWER EXTREMITY DOPPLER  STUDY Indications: Rest pain. High Risk Factors: Hypertension, hyperlipidemia, Diabetes.  Comparison Study: No prior studies. Performing Technologist: Carlos Levering RVT  Examination Guidelines: A complete evaluation includes at minimum, Doppler waveform signals and systolic blood pressure reading at the level of bilateral brachial, anterior tibial, and posterior tibial arteries, when vessel segments are accessible. Bilateral testing is considered an integral part of a complete examination. Photoelectric Plethysmograph (PPG) waveforms and toe systolic pressure readings are included as  required and additional duplex testing as needed. Limited examinations for reoccurring indications may be performed as noted.  ABI Findings: +---------+------------------+-----+---------+--------+ Right    Rt Pressure (mmHg)IndexWaveform Comment  +---------+------------------+-----+---------+--------+ Brachial 148                    triphasic         +---------+------------------+-----+---------+--------+ PTA      149               1.01 triphasic         +---------+------------------+-----+---------+--------+ DP       146               0.99 triphasic         +---------+------------------+-----+---------+--------+ Great Toe140               0.95                   +---------+------------------+-----+---------+--------+ +---------+------------------+-----+---------+-------+ Left     Lt Pressure (mmHg)IndexWaveform Comment +---------+------------------+-----+---------+-------+ Brachial 145                    triphasic        +---------+------------------+-----+---------+-------+ PTA      108               0.73 biphasic         +---------+------------------+-----+---------+-------+ DP       103               0.70 biphasic         +---------+------------------+-----+---------+-------+ Great Toe91                0.61                   +---------+------------------+-----+---------+-------+ +-------+-----------+-----------+------------+------------+ ABI/TBIToday's ABIToday's TBIPrevious ABIPrevious TBI +-------+-----------+-----------+------------+------------+ Right  1.01       0.95                                +-------+-----------+-----------+------------+------------+ Left   0.73       0.61                                +-------+-----------+-----------+------------+------------+  Summary: Right: Resting right ankle-brachial index is within normal range. No evidence of significant right lower extremity arterial disease. The right toe-brachial index is normal. Left: Resting left ankle-brachial index indicates moderate left lower extremity arterial disease. The left toe-brachial index is abnormal.  *See table(s) above for measurements and observations.  Electronically signed by Jamelle Haring on 10/13/2020 at 2:56:26 PM.    Final    ECHOCARDIOGRAM COMPLETE  Result Date: 10/12/2020    ECHOCARDIOGRAM REPORT   Patient Name:   CYNTHIE GARMON Date of Exam: 10/12/2020 Medical Rec #:  [id number]      Height:       65.0 in Accession #:    [id number]     Weight:       247.0 lb Date of Birth:  [birth year]      BSA:          2.164 m Patient Age:    68 years       BP:           133/96 mmHg Patient Gender: F              HR:  98 bpm. Exam Location:  Inpatient Procedure: 2D Echo, Cardiac Doppler, Color Doppler and Intracardiac            Opacification Agent Indications:    CHF  History:        Patient has prior history of Echocardiogram examinations. Risk                 Factors:Hypertension, Sleep Apnea, Diabetes and Current Smoker.  Sonographer:    Clayton Lefort RDCS (AE) Referring Phys: [place]  1. Left ventricular ejection fraction, by estimation, is 20 to 25%. The left ventricle has severely decreased function. The left ventricle demonstrates global hypokinesis. There is mild left ventricular  hypertrophy. Left ventricular diastolic parameters  are consistent with Grade II diastolic dysfunction (pseudonormalization). Elevated left atrial pressure.  2. Right ventricular systolic function is mildly reduced. The right ventricular size is normal. There is moderately elevated pulmonary artery systolic pressure. The estimated right ventricular systolic pressure is [id number] mmHg.  3. A small pericardial effusion is present. The pericardial effusion is circumferential.  4. The mitral valve is normal in structure. Trivial mitral valve regurgitation.  5. The aortic valve was not well visualized. Aortic valve regurgitation is trivial. No aortic stenosis is present.  6. The inferior vena cava is dilated in size with <50% respiratory variability, suggesting right atrial pressure of 15 mmHg. FINDINGS  Left Ventricle: Left ventricular ejection fraction, by estimation, is 20 to 25%. The left ventricle has severely decreased function. The left ventricle demonstrates global hypokinesis. Definity contrast agent was given IV to delineate the left ventricular endocardial borders. The left ventricular internal cavity size was normal in size. There is mild left ventricular hypertrophy. Left ventricular diastolic parameters are consistent with Grade II diastolic dysfunction (pseudonormalization). Elevated left atrial pressure. Right Ventricle: The right ventricular size is normal. Right vetricular wall thickness was not well visualized. Right ventricular systolic function is mildly reduced. There is moderately elevated pulmonary artery systolic pressure. The tricuspid regurgitant velocity is 3.15 m/s, and with an assumed right atrial pressure of 15 mmHg, the estimated right ventricular systolic pressure is [id number] mmHg. Left Atrium: Left atrial size was normal in size. Right Atrium: Right atrial size was normal in size. Pericardium: A small pericardial effusion is present. The pericardial effusion is circumferential. Mitral Valve: The  mitral valve is normal in structure. Trivial mitral valve regurgitation. MV peak gradient, 7.2 mmHg. The mean mitral valve gradient is 3.0 mmHg. Tricuspid Valve: The tricuspid valve is normal in structure. Tricuspid valve regurgitation is trivial. Aortic Valve: The aortic valve was not well visualized. Aortic valve regurgitation is trivial. No aortic stenosis is present. Aortic valve mean gradient measures 4.0 mmHg. Aortic valve peak gradient measures 8.5 mmHg. Aortic valve area, by VTI measures 1.59 cm. Pulmonic Valve: The pulmonic valve was not well visualized. Pulmonic valve regurgitation is not visualized. Aorta: The aortic root and ascending aorta are structurally normal, with no evidence of dilitation. Venous: The inferior vena cava is dilated in size with less than 50% respiratory variability, suggesting right atrial pressure of 15 mmHg. IAS/Shunts: No atrial level shunt detected by color flow Doppler.  LEFT VENTRICLE PLAX 2D LVIDd:         4.90 cm  Diastology LVIDs:         4.40 cm  LV e' medial:    6.30 cm/s LV PW:         1.30 cm  LV E/e' medial:  17.9 LV IVS:  1.10 cm  LV e' lateral:   8.10 cm/s LVOT diam:     2.00 cm  LV E/e' lateral: 14.0 LV SV:         34 LV SV Index:   16 LVOT Area:     3.14 cm  RIGHT VENTRICLE            IVC RV Basal diam:  3.30 cm    IVC diam: 2.10 cm RV S prime:     7.35 cm/s TAPSE (M-mode): 1.6 cm LEFT ATRIUM             Index       RIGHT ATRIUM           Index LA diam:        3.70 cm 1.71 cm/m  RA Area:     18.60 cm LA Vol (A2C):   56.1 ml 25.93 ml/m RA Volume:   49.00 ml  22.65 ml/m LA Vol (A4C):   75.3 ml 34.80 ml/m LA Biplane Vol: 69.5 ml 32.12 ml/m  AORTIC VALVE AV Area (Vmax):    1.53 cm AV Area (Vmean):   1.54 cm AV Area (VTI):     1.59 cm AV Vmax:           146.00 cm/s AV Vmean:          90.200 cm/s AV VTI:            0.216 m AV Peak Grad:      8.5 mmHg AV Mean Grad:      4.0 mmHg LVOT Vmax:         71.30 cm/s LVOT Vmean:        44.200 cm/s LVOT VTI:           0.109 m LVOT/AV VTI ratio: 0.50  AORTA Ao Root diam: 3.00 cm Ao Asc diam:  3.40 cm MITRAL VALVE                TRICUSPID VALVE MV Area (PHT): 4.41 cm     TR Peak grad:   39.7 mmHg MV Area VTI:   1.24 cm     TR Vmax:        315.00 cm/s MV Peak grad:  7.2 mmHg MV Mean grad:  3.0 mmHg     SHUNTS MV Vmax:       1.34 m/s     Systemic VTI:  0.11 m MV Vmean:      71.3 cm/s    Systemic Diam: 2.00 cm MV Decel Time: 172 msec MV E velocity: 113.00 cm/s MV A velocity: 64.00 cm/s MV E/A ratio:  1.77 Oswaldo Milian MD Electronically signed by Oswaldo Milian MD Signature Date/Time: 10/12/2020/6:17:52 PM    Final         Scheduled Meds: . aspirin  81 mg Oral Daily  . atorvastatin  40 mg Oral Daily  . carvedilol  3.125 mg Oral BID WC  . [START ON 10/14/2020] clopidogrel  75 mg Oral Daily  . digoxin  0.125 mg Oral Daily  . doxycycline  100 mg Oral Q12H  . [START ON 10/14/2020] enoxaparin (LOVENOX) injection  40 mg Subcutaneous Q24H  . furosemide  80 mg Intravenous BID  . insulin aspart  0-9 Units Subcutaneous TID WC  . insulin aspart  6 Units Subcutaneous TID WC  . insulin glargine  30 Units Subcutaneous Daily  . losartan  100 mg Oral Daily  . nicotine  21 mg Transdermal Daily  . potassium chloride  40 mEq Oral Daily  . sodium chloride flush  3 mL Intravenous Q12H  . sodium chloride flush  3 mL Intravenous Q12H  . spironolactone  12.5 mg Oral Daily   Continuous Infusions: . sodium chloride       LOS: 2 days    Time spent: 35 minutes    Blain Pais, MD [facility]   If 7PM-7AM, please contact night-coverage [email or web address] Password [facility] 10/13/2020, 5:56 PM

## 2020-10-13 NOTE — TOC Progression Note (Signed)
Transition of Care [facility]) - Progression Note    Patient Details  Name: Catherine Espinoza MRN: [id number] Date of Birth: 11-20-1975  Transition of Care [facility] CM/SW Contact  Zenon Mayo, RN Phone Number: 10/13/2020, 11:55 AM  Clinical Narrative:    Per pharmacist note patient co pay is 180.00 for  The Basaglar , but she will be switched to tresiba which the co-pay will be about 24.99 per month. Pharmacist has spoke with patient regarding this also.        Expected Discharge Plan and Services                                                 Social Determinants of Health (SDOH) Interventions Food Insecurity Interventions: Intervention Not Indicated Financial Strain Interventions: Other (Comment) (Pharmacist cost check.) Housing Interventions: Intervention Not Indicated Transportation Interventions: Intervention Not Indicated  Readmission Risk Interventions No flowsheet data found.

## 2020-10-13 NOTE — Progress Notes (Signed)
Inpatient Diabetes Program Recommendations  AACE/ADA: New Consensus Statement on Inpatient Glycemic Control (2015)  Target Ranges:  Prepandial:   less than 140 mg/dL      Peak postprandial:   less than 180 mg/dL (1-2 hours)      Critically ill patients:  140 - 180 mg/dL   Lab Results  Component Value Date   GLUCAP 80 10/13/2020   HGBA1C 13.5 (A) 10/04/2020    Review of Glycemic Control Results for Catherine Espinoza, Catherine Espinoza (MRN [id number]) as of 10/13/2020 07:46  Ref. Range 10/12/2020 12:46 10/12/2020 16:12 10/12/2020 21:00 10/13/2020 05:48  Glucose-Capillary Latest Ref Range: 70 - 99 mg/dL 162 (H) 111 (H) 81 80  Diabetes history: DM 2 Outpatient Diabetes medications:  Basaglar 65 units daily, Freestyle Libre Current orders for Inpatient glycemic control:  Lantus 40 units daily, Novolog 6 units tid with meals, Novolog sensitive tid with meals  Inpatient Diabetes Program Recommendations:    Note low fasting CBG. Please reduce Lantus to 30 units daily.   May need more affordable insulin regimen at d/c such as the Reli-On 70/30 from [employer] (5 pens is 42$).  Consider at d/c Novolin 70/30 16 units bid with meals.  Patient also has Rx. For [employer] sensors for monitoring.    Thanks,  Adah Perl, RN, BC-ADM Inpatient Diabetes Coordinator Pager [phone] (8a-5p)

## 2020-10-13 NOTE — H&P (View-Only) (Signed)
Progress Note  Patient Name: Jonya Inglett Date of Encounter: 10/13/2020  [facility] Cardiologist: New  Subjective   No dyspnea or CP  Inpatient Medications    Scheduled Meds: . atorvastatin  40 mg Oral Daily  . doxycycline  100 mg Oral Q12H  . enoxaparin (LOVENOX) injection  40 mg Subcutaneous Q24H  . furosemide  40 mg Intravenous BID  . insulin aspart  0-9 Units Subcutaneous TID WC  . insulin aspart  6 Units Subcutaneous TID WC  . insulin glargine  30 Units Subcutaneous Daily  . losartan  100 mg Oral Daily  . nicotine  21 mg Transdermal Daily   Continuous Infusions:  PRN Meds: acetaminophen, HYDROcodone-acetaminophen   Vital Signs    Vitals:   10/12/20 1248 10/12/20 1750 10/12/20 1941 10/13/20 0518  BP: 132/90 132/80 139/84 (!) 146/85  Pulse: 91 97 (!) 101 83  Resp: 18 18 18 16   Temp:   97.7 F (36.5 C) 98.3 F (36.8 C)  TempSrc:   Oral Oral  SpO2: 93% 93% 94% 92%  Weight:    110.7 kg  Height:        Intake/Output Summary (Last 24 hours) at 10/13/2020 0836 Last data filed at 10/13/2020 0500 Gross per 24 hour  Intake 320 ml  Output 2930 ml  Net -2610 ml   Last 3 Weights 10/13/2020 10/12/2020 10/11/2020  Weight (lbs) 244 lb 247 lb 249 lb 11.2 oz  Weight (kg) 110.678 kg 112.038 kg 113.263 kg      Telemetry    Sinus - Personally Reviewed  Physical Exam   GEN: No acute distress.   Neck: No JVD Cardiac: RRR Respiratory: Clear to auscultation bilaterally. GI: Soft, nontender, non-distended  MS: 1+ ankle edema Neuro:  Nonfocal  Psych: Normal affect   Labs    High Sensitivity Troponin:   Recent Labs  Lab 10/11/20 1244 10/11/20 1432  TROPONINIHS 18* 18*      Chemistry Recent Labs  Lab 10/11/20 1128 10/12/20 0145 10/13/20 0427  NA 133* 136 139  K 3.8 3.7 3.7  CL 100 101 101  CO2 25 26 26   GLUCOSE 334* 189* 63*  BUN 9 8 14   CREATININE 0.60 0.67 0.61  CALCIUM 8.7* 8.3* 8.7*  PROT  --  5.9*  --   ALBUMIN  --  2.0*  --   AST  --   11*  --   ALT  --  20  --   ALKPHOS  --  84  --   BILITOT  --  0.5  --   GFRNONAA >60 >60 >60  ANIONGAP 8 9 12      Hematology Recent Labs  Lab 10/11/20 1128 10/12/20 0145  WBC 11.8* 10.0  RBC 4.81 4.48  HGB 14.3 13.4  HCT 43.6 40.8  MCV 90.6 91.1  MCH 29.7 29.9  MCHC 32.8 32.8  RDW 13.4 13.7  PLT 235 214    BNP Recent Labs  Lab 10/11/20 1128  BNP 501.2*     DDimer  Recent Labs  Lab 10/11/20 1244  DDIMER 0.88*     Radiology    CT Angio Chest PE W/Cm &/Or Wo Cm  Result Date: 10/11/2020 CLINICAL DATA:  Elevated D-dimer. CHF of new onset with dyspnea. Bilateral lower extremity swelling and pain. EXAM: CT ANGIOGRAPHY CHEST WITH CONTRAST TECHNIQUE: Multidetector CT imaging of the chest was performed using the standard protocol during bolus administration of intravenous contrast. Multiplanar CT image reconstructions and MIPs were obtained to evaluate the  vascular anatomy. CONTRAST:  OMNIPAQUE IOHEXOL 350 MG/ML SOLN COMPARISON:  05/28/2019 chest CT. FINDINGS: Cardiovascular: The study is high quality for the evaluation of pulmonary embolism. There are no filling defects in the central, lobar, segmental or subsegmental pulmonary artery branches to suggest acute pulmonary embolism. Atherosclerotic nonaneurysmal thoracic aorta. Dilated main pulmonary artery (3.6 cm diameter). Borderline mild cardiomegaly. Small pericardial effusion/thickening, slightly increased. Left anterior descending coronary atherosclerosis. Mediastinum/Nodes: No discrete thyroid nodules. Unremarkable esophagus. Mild right axillary adenopathy up to 1.1 cm short axis diameter (series 7/image 39), not appreciably changed. Stable mild left supraclavicular adenopathy up to 1.1 cm (series 7/image 8). Mild right paratracheal adenopathy up to 1.6 cm (series 7/image 23), stable. No pathologically enlarged hilar nodes. Lungs/Pleura: No pneumothorax. Small dependent bilateral pleural effusions, right greater than  left. Moderate patchy ground-glass opacities throughout both lungs with worsened interlobular septal thickening throughout both lungs. No lung masses or significant pulmonary nodules. Upper abdomen: Mild contrast reflux into the IVC and hepatic veins. Musculoskeletal: No aggressive appearing focal osseous lesions. Mild thoracic spondylosis. Mild anasarca. Review of the MIP images confirms the above findings. IMPRESSION: 1. No pulmonary embolism. 2. Borderline mild cardiomegaly. Small pericardial effusion/thickening, slightly increased. Mild contrast reflux into the IVC and hepatic veins, which may indicate right heart failure. 3. Moderate patchy ground-glass opacities throughout both lungs with worsened interlobular septal thickening throughout both lungs, most compatible with cardiogenic pulmonary edema. 4. Small dependent bilateral pleural effusions, right greater than left. 5. Dilated main pulmonary artery, suggesting pulmonary arterial hypertension. 6. Chronic mild right axillary, left supraclavicular and mediastinal lymphadenopathy, stable, presumably reactive. 7. Aortic Atherosclerosis (ICD10-I70.0). Electronically Signed   By: Delbert Phenix M.D.   On: 10/11/2020 14:35   DG Chest Port 1 View  Result Date: 10/11/2020 CLINICAL DATA:  Bilateral lower extremity pain and swelling. EXAM: PORTABLE CHEST 1 VIEW COMPARISON:  PA and lateral chest 05/09/2018. FINDINGS: The cardiopericardial silhouette is enlarged. There is diffuse hazy bilateral airspace disease. No pneumothorax or pleural effusion. IMPRESSION: Enlarged cardiopericardial silhouette compatible with cardiomegaly and/or pericardial effusion. Diffuse hazy airspace opacity likely due to pulmonary edema. Electronically Signed   By: Drusilla Kanner M.D.   On: 10/11/2020 11:24   VAS [place] ABI WITH/WO TBI  Result Date: 10/12/2020 LOWER EXTREMITY DOPPLER STUDY Indications: Rest pain. High Risk Factors: Hypertension, hyperlipidemia, Diabetes.  Comparison  Study: No prior studies. Performing Technologist: Olen Cordial RVT  Examination Guidelines: A complete evaluation includes at minimum, Doppler waveform signals and systolic blood pressure reading at the level of bilateral brachial, anterior tibial, and posterior tibial arteries, when vessel segments are accessible. Bilateral testing is considered an integral part of a complete examination. Photoelectric Plethysmograph (PPG) waveforms and toe systolic pressure readings are included as required and additional duplex testing as needed. Limited examinations for reoccurring indications may be performed as noted.  ABI Findings: +---------+------------------+-----+---------+--------+ Right    Rt Pressure (mmHg)IndexWaveform Comment  +---------+------------------+-----+---------+--------+ Brachial 148                    triphasic         +---------+------------------+-----+---------+--------+ PTA      149               1.01 triphasic         +---------+------------------+-----+---------+--------+ DP       146               0.99 triphasic         +---------+------------------+-----+---------+--------+ Irineo Axon  0.95                   +---------+------------------+-----+---------+--------+ +---------+------------------+-----+---------+-------+ Left     Lt Pressure (mmHg)IndexWaveform Comment +---------+------------------+-----+---------+-------+ Brachial 145                    triphasic        +---------+------------------+-----+---------+-------+ PTA      108               0.73 biphasic         +---------+------------------+-----+---------+-------+ DP       103               0.70 biphasic         +---------+------------------+-----+---------+-------+ Great Toe91                0.61                  +---------+------------------+-----+---------+-------+ +-------+-----------+-----------+------------+------------+ ABI/TBIToday's ABIToday's  TBIPrevious ABIPrevious TBI +-------+-----------+-----------+------------+------------+ Right  1.01       0.95                                +-------+-----------+-----------+------------+------------+ Left   0.73       0.61                                +-------+-----------+-----------+------------+------------+  Summary: Right: Resting right ankle-brachial index is within normal range. No evidence of significant right lower extremity arterial disease. The right toe-brachial index is normal. Left: Resting left ankle-brachial index indicates moderate left lower extremity arterial disease. The left toe-brachial index is abnormal.  *See table(s) above for measurements and observations.     Preliminary    ECHOCARDIOGRAM COMPLETE  Result Date: 10/12/2020    ECHOCARDIOGRAM REPORT   Patient Name:   PAULENE TAYAG Date of Exam: 10/12/2020 Medical Rec #:  [id number]      Height:       65.0 in Accession #:    [id number]     Weight:       247.0 lb Date of Birth:  [birth year]      BSA:          2.164 m Patient Age:    8 years       BP:           133/96 mmHg Patient Gender: F              HR:           98 bpm. Exam Location:  Inpatient Procedure: 2D Echo, Cardiac Doppler, Color Doppler and Intracardiac            Opacification Agent Indications:    CHF  History:        Patient has prior history of Echocardiogram examinations. Risk                 Factors:Hypertension, Sleep Apnea, Diabetes and Current Smoker.  Sonographer:    Clayton Lefort RDCS (AE) Referring Phys: [place]  1. Left ventricular ejection fraction, by estimation, is 20 to 25%. The left ventricle has severely decreased function. The left ventricle demonstrates global hypokinesis. There is mild left ventricular hypertrophy. Left ventricular diastolic parameters  are consistent with Grade II diastolic dysfunction (pseudonormalization). Elevated left atrial pressure.  2. Right ventricular systolic function is mildly reduced.  The right  ventricular size is normal. There is moderately elevated pulmonary artery systolic pressure. The estimated right ventricular systolic pressure is 30.1 mmHg.  3. A small pericardial effusion is present. The pericardial effusion is circumferential.  4. The mitral valve is normal in structure. Trivial mitral valve regurgitation.  5. The aortic valve was not well visualized. Aortic valve regurgitation is trivial. No aortic stenosis is present.  6. The inferior vena cava is dilated in size with <50% respiratory variability, suggesting right atrial pressure of 15 mmHg. FINDINGS  Left Ventricle: Left ventricular ejection fraction, by estimation, is 20 to 25%. The left ventricle has severely decreased function. The left ventricle demonstrates global hypokinesis. Definity contrast agent was given IV to delineate the left ventricular endocardial borders. The left ventricular internal cavity size was normal in size. There is mild left ventricular hypertrophy. Left ventricular diastolic parameters are consistent with Grade II diastolic dysfunction (pseudonormalization). Elevated left atrial pressure. Right Ventricle: The right ventricular size is normal. Right vetricular wall thickness was not well visualized. Right ventricular systolic function is mildly reduced. There is moderately elevated pulmonary artery systolic pressure. The tricuspid regurgitant velocity is 3.15 m/s, and with an assumed right atrial pressure of 15 mmHg, the estimated right ventricular systolic pressure is 60.1 mmHg. Left Atrium: Left atrial size was normal in size. Right Atrium: Right atrial size was normal in size. Pericardium: A small pericardial effusion is present. The pericardial effusion is circumferential. Mitral Valve: The mitral valve is normal in structure. Trivial mitral valve regurgitation. MV peak gradient, 7.2 mmHg. The mean mitral valve gradient is 3.0 mmHg. Tricuspid Valve: The tricuspid valve is normal in structure. Tricuspid  valve regurgitation is trivial. Aortic Valve: The aortic valve was not well visualized. Aortic valve regurgitation is trivial. No aortic stenosis is present. Aortic valve mean gradient measures 4.0 mmHg. Aortic valve peak gradient measures 8.5 mmHg. Aortic valve area, by VTI measures 1.59 cm. Pulmonic Valve: The pulmonic valve was not well visualized. Pulmonic valve regurgitation is not visualized. Aorta: The aortic root and ascending aorta are structurally normal, with no evidence of dilitation. Venous: The inferior vena cava is dilated in size with less than 50% respiratory variability, suggesting right atrial pressure of 15 mmHg. IAS/Shunts: No atrial level shunt detected by color flow Doppler.  LEFT VENTRICLE PLAX 2D LVIDd:         4.90 cm  Diastology LVIDs:         4.40 cm  LV e' medial:    6.30 cm/s LV PW:         1.30 cm  LV E/e' medial:  17.9 LV IVS:        1.10 cm  LV e' lateral:   8.10 cm/s LVOT diam:     2.00 cm  LV E/e' lateral: 14.0 LV SV:         34 LV SV Index:   16 LVOT Area:     3.14 cm  RIGHT VENTRICLE            IVC RV Basal diam:  3.30 cm    IVC diam: 2.10 cm RV S prime:     7.35 cm/s TAPSE (M-mode): 1.6 cm LEFT ATRIUM             Index       RIGHT ATRIUM           Index LA diam:        3.70 cm 1.71 cm/m  RA Area:     18.60 cm  LA Vol (A2C):   56.1 ml 25.93 ml/m RA Volume:   49.00 ml  22.65 ml/m LA Vol (A4C):   75.3 ml 34.80 ml/m LA Biplane Vol: 69.5 ml 32.12 ml/m  AORTIC VALVE AV Area (Vmax):    1.53 cm AV Area (Vmean):   1.54 cm AV Area (VTI):     1.59 cm AV Vmax:           146.00 cm/s AV Vmean:          90.200 cm/s AV VTI:            0.216 m AV Peak Grad:      8.5 mmHg AV Mean Grad:      4.0 mmHg LVOT Vmax:         71.30 cm/s LVOT Vmean:        44.200 cm/s LVOT VTI:          0.109 m LVOT/AV VTI ratio: 0.50  AORTA Ao Root diam: 3.00 cm Ao Asc diam:  3.40 cm MITRAL VALVE                TRICUSPID VALVE MV Area (PHT): 4.41 cm     TR Peak grad:   39.7 mmHg MV Area VTI:   1.24 cm     TR  Vmax:        315.00 cm/s MV Peak grad:  7.2 mmHg MV Mean grad:  3.0 mmHg     SHUNTS MV Vmax:       1.34 m/s     Systemic VTI:  0.11 m MV Vmean:      71.3 cm/s    Systemic Diam: 2.00 cm MV Decel Time: 172 msec MV E velocity: 113.00 cm/s MV A velocity: 64.00 cm/s MV E/A ratio:  1.77 Oswaldo Milian MD Electronically signed by Oswaldo Milian MD Signature Date/Time: 10/12/2020/6:17:52 PM    Final     Patient Profile     45 year old female with past medical history of hypertension, sleep apnea, diabetes mellitus, tobacco abuse for evaluation of acute heart failure. Chest CT shows no pulmonary embolus.  The pulmonary artery is enlarged suggesting pulmonary hypertension.  Small pericardial effusion.  There is also note of pulmonary edema and pleural effusions.  Echocardiogram shows ejection fraction 20 to 25%, mild left ventricular hypertrophy, grade 2 diastolic dysfunction, mild RV dysfunction, moderate pulmonary hypertension, small pericardial effusion.  Assessment & Plan    1 acute CHF-symptoms improving.  I/O-2390. Continue Lasix 40 mg IV twice daily and follow renal function.  Add spironolactone 12.5 mg daily. Echocardiogram shows severe LV dysfunction.  We will continue losartan today and transition to [facility] tomorrow.  Add coreg 3.125 mg BID. Add digoxin 0.125 mg daily. Etiology of cardiomyopathy unclear.  No history of alcohol abuse or recent viral symptoms.  Question contribution from hypertension.  Multiple risk factors for coronary disease.  Needs right and left cardiac catheterization.  I discussed the risk and benefits including myocardial infarction, CVA and death and she agrees to proceed.  We will arrange for later today.  We will need to fully titrate medications and once at goal repeat echocardiogram.  If ejection fraction less than 35% would need ICD.  2 hypertension-patient's blood pressure is mildly elevated.  Transition to [facility] tomorrow after being off of ACE inhibitor for  36 hours.  Add carvedilol 3.125 mg twice daily.  Titrate medications as tolerated.  3 dilated pulmonary artery-consistent with pulmonary hypertension.  Likely secondary to obesity hypoventilation syndrome, obstructive sleep apnea  and diastolic congestive heart failure.  4 tobacco abuse-patient previously counseled on discontinuing.  5 probable obstructive sleep apnea-we will need outpatient referral for management after discharge.  6 hyperlipidemia-continue statin.  7 diabetes mellitus-Per primary care.  For questions or updates, please contact [place] Please consult [email or web address] for contact info under        Signed, Kirk Ruths, MD  10/13/2020, 8:36 AM

## 2020-10-13 NOTE — Progress Notes (Signed)
Progress Note  Patient Name: Catherine Espinoza Date of Encounter: 10/13/2020  [facility] Cardiologist: New  Subjective   No dyspnea or CP  Inpatient Medications    Scheduled Meds: . atorvastatin  40 mg Oral Daily  . doxycycline  100 mg Oral Q12H  . enoxaparin (LOVENOX) injection  40 mg Subcutaneous Q24H  . furosemide  40 mg Intravenous BID  . insulin aspart  0-9 Units Subcutaneous TID WC  . insulin aspart  6 Units Subcutaneous TID WC  . insulin glargine  30 Units Subcutaneous Daily  . losartan  100 mg Oral Daily  . nicotine  21 mg Transdermal Daily   Continuous Infusions:  PRN Meds: acetaminophen, HYDROcodone-acetaminophen   Vital Signs    Vitals:   10/12/20 1248 10/12/20 1750 10/12/20 1941 10/13/20 0518  BP: 132/90 132/80 139/84 (!) 146/85  Pulse: 91 97 (!) 101 83  Resp: 18 18 18 16   Temp:   97.7 F (36.5 C) 98.3 F (36.8 C)  TempSrc:   Oral Oral  SpO2: 93% 93% 94% 92%  Weight:    110.7 kg  Height:        Intake/Output Summary (Last 24 hours) at 10/13/2020 0836 Last data filed at 10/13/2020 0500 Gross per 24 hour  Intake 320 ml  Output 2930 ml  Net -2610 ml   Last 3 Weights 10/13/2020 10/12/2020 10/11/2020  Weight (lbs) 244 lb 247 lb 249 lb 11.2 oz  Weight (kg) 110.678 kg 112.038 kg 113.263 kg      Telemetry    Sinus - Personally Reviewed  Physical Exam   GEN: No acute distress.   Neck: No JVD Cardiac: RRR Respiratory: Clear to auscultation bilaterally. GI: Soft, nontender, non-distended  MS: 1+ ankle edema Neuro:  Nonfocal  Psych: Normal affect   Labs    High Sensitivity Troponin:   Recent Labs  Lab 10/11/20 1244 10/11/20 1432  TROPONINIHS 18* 18*      Chemistry Recent Labs  Lab 10/11/20 1128 10/12/20 0145 10/13/20 0427  NA 133* 136 139  K 3.8 3.7 3.7  CL 100 101 101  CO2 25 26 26   GLUCOSE 334* 189* 63*  BUN 9 8 14   CREATININE 0.60 0.67 0.61  CALCIUM 8.7* 8.3* 8.7*  PROT  --  5.9*  --   ALBUMIN  --  2.0*  --   AST  --   11*  --   ALT  --  20  --   ALKPHOS  --  84  --   BILITOT  --  0.5  --   GFRNONAA >60 >60 >60  ANIONGAP 8 9 12      Hematology Recent Labs  Lab 10/11/20 1128 10/12/20 0145  WBC 11.8* 10.0  RBC 4.81 4.48  HGB 14.3 13.4  HCT 43.6 40.8  MCV 90.6 91.1  MCH 29.7 29.9  MCHC 32.8 32.8  RDW 13.4 13.7  PLT 235 214    BNP Recent Labs  Lab 10/11/20 1128  BNP 501.2*     DDimer  Recent Labs  Lab 10/11/20 1244  DDIMER 0.88*     Radiology    CT Angio Chest PE W/Cm &/Or Wo Cm  Result Date: 10/11/2020 CLINICAL DATA:  Elevated D-dimer. CHF of new onset with dyspnea. Bilateral lower extremity swelling and pain. EXAM: CT ANGIOGRAPHY CHEST WITH CONTRAST TECHNIQUE: Multidetector CT imaging of the chest was performed using the standard protocol during bolus administration of intravenous contrast. Multiplanar CT image reconstructions and MIPs were obtained to evaluate the  vascular anatomy. CONTRAST:  100mL OMNIPAQUE IOHEXOL 350 MG/ML SOLN COMPARISON:  05/28/2019 chest CT. FINDINGS: Cardiovascular: The study is high quality for the evaluation of pulmonary embolism. There are no filling defects in the central, lobar, segmental or subsegmental pulmonary artery branches to suggest acute pulmonary embolism. Atherosclerotic nonaneurysmal thoracic aorta. Dilated main pulmonary artery (3.6 cm diameter). Borderline mild cardiomegaly. Small pericardial effusion/thickening, slightly increased. Left anterior descending coronary atherosclerosis. Mediastinum/Nodes: No discrete thyroid nodules. Unremarkable esophagus. Mild right axillary adenopathy up to 1.1 cm short axis diameter (series 7/image 39), not appreciably changed. Stable mild left supraclavicular adenopathy up to 1.1 cm (series 7/image 8). Mild right paratracheal adenopathy up to 1.6 cm (series 7/image 23), stable. No pathologically enlarged hilar nodes. Lungs/Pleura: No pneumothorax. Small dependent bilateral pleural effusions, right greater than  left. Moderate patchy ground-glass opacities throughout both lungs with worsened interlobular septal thickening throughout both lungs. No lung masses or significant pulmonary nodules. Upper abdomen: Mild contrast reflux into the IVC and hepatic veins. Musculoskeletal: No aggressive appearing focal osseous lesions. Mild thoracic spondylosis. Mild anasarca. Review of the MIP images confirms the above findings. IMPRESSION: 1. No pulmonary embolism. 2. Borderline mild cardiomegaly. Small pericardial effusion/thickening, slightly increased. Mild contrast reflux into the IVC and hepatic veins, which may indicate right heart failure. 3. Moderate patchy ground-glass opacities throughout both lungs with worsened interlobular septal thickening throughout both lungs, most compatible with cardiogenic pulmonary edema. 4. Small dependent bilateral pleural effusions, right greater than left. 5. Dilated main pulmonary artery, suggesting pulmonary arterial hypertension. 6. Chronic mild right axillary, left supraclavicular and mediastinal lymphadenopathy, stable, presumably reactive. 7. Aortic Atherosclerosis (ICD10-I70.0). Electronically Signed   By: Jason A Poff M.D.   On: 10/11/2020 14:35   DG Chest Port 1 View  Result Date: 10/11/2020 CLINICAL DATA:  Bilateral lower extremity pain and swelling. EXAM: PORTABLE CHEST 1 VIEW COMPARISON:  PA and lateral chest 05/09/2018. FINDINGS: The cardiopericardial silhouette is enlarged. There is diffuse hazy bilateral airspace disease. No pneumothorax or pleural effusion. IMPRESSION: Enlarged cardiopericardial silhouette compatible with cardiomegaly and/or pericardial effusion. Diffuse hazy airspace opacity likely due to pulmonary edema. Electronically Signed   By: Thomas  Dalessio M.D.   On: 10/11/2020 11:24   VAS US ABI WITH/WO TBI  Result Date: 10/12/2020 LOWER EXTREMITY DOPPLER STUDY Indications: Rest pain. High Risk Factors: Hypertension, hyperlipidemia, Diabetes.  Comparison  Study: No prior studies. Performing Technologist: Collins, Greg RVT  Examination Guidelines: A complete evaluation includes at minimum, Doppler waveform signals and systolic blood pressure reading at the level of bilateral brachial, anterior tibial, and posterior tibial arteries, when vessel segments are accessible. Bilateral testing is considered an integral part of a complete examination. Photoelectric Plethysmograph (PPG) waveforms and toe systolic pressure readings are included as required and additional duplex testing as needed. Limited examinations for reoccurring indications may be performed as noted.  ABI Findings: +---------+------------------+-----+---------+--------+ Right    Rt Pressure (mmHg)IndexWaveform Comment  +---------+------------------+-----+---------+--------+ Brachial 148                    triphasic         +---------+------------------+-----+---------+--------+ PTA      149               1.01 triphasic         +---------+------------------+-----+---------+--------+ DP       146               0.99 triphasic         +---------+------------------+-----+---------+--------+ Great Toe140                 0.95                   +---------+------------------+-----+---------+--------+ +---------+------------------+-----+---------+-------+ Left     Lt Pressure (mmHg)IndexWaveform Comment +---------+------------------+-----+---------+-------+ Brachial 145                    triphasic        +---------+------------------+-----+---------+-------+ PTA      108               0.73 biphasic         +---------+------------------+-----+---------+-------+ DP       103               0.70 biphasic         +---------+------------------+-----+---------+-------+ Great Toe91                0.61                  +---------+------------------+-----+---------+-------+ +-------+-----------+-----------+------------+------------+ ABI/TBIToday's ABIToday's  TBIPrevious ABIPrevious TBI +-------+-----------+-----------+------------+------------+ Right  1.01       0.95                                +-------+-----------+-----------+------------+------------+ Left   0.73       0.61                                +-------+-----------+-----------+------------+------------+  Summary: Right: Resting right ankle-brachial index is within normal range. No evidence of significant right lower extremity arterial disease. The right toe-brachial index is normal. Left: Resting left ankle-brachial index indicates moderate left lower extremity arterial disease. The left toe-brachial index is abnormal.  *See table(s) above for measurements and observations.     Preliminary    ECHOCARDIOGRAM COMPLETE  Result Date: 10/12/2020    ECHOCARDIOGRAM REPORT   Patient Name:   Catherine Espinoza Date of Exam: 10/12/2020 Medical Rec #:  [id number]      Height:       65.0 in Accession #:    [id number]     Weight:       247.0 lb Date of Birth:  [birth year]      BSA:          2.164 m Patient Age:    8 years       BP:           133/96 mmHg Patient Gender: F              HR:           98 bpm. Exam Location:  Inpatient Procedure: 2D Echo, Cardiac Doppler, Color Doppler and Intracardiac            Opacification Agent Indications:    CHF  History:        Patient has prior history of Echocardiogram examinations. Risk                 Factors:Hypertension, Sleep Apnea, Diabetes and Current Smoker.  Sonographer:    Clayton Lefort RDCS (AE) Referring Phys: [place]  1. Left ventricular ejection fraction, by estimation, is 20 to 25%. The left ventricle has severely decreased function. The left ventricle demonstrates global hypokinesis. There is mild left ventricular hypertrophy. Left ventricular diastolic parameters  are consistent with Grade II diastolic dysfunction (pseudonormalization). Elevated left atrial pressure.  2. Right ventricular systolic function is mildly reduced.  The right  ventricular size is normal. There is moderately elevated pulmonary artery systolic pressure. The estimated right ventricular systolic pressure is 30.1 mmHg.  3. A small pericardial effusion is present. The pericardial effusion is circumferential.  4. The mitral valve is normal in structure. Trivial mitral valve regurgitation.  5. The aortic valve was not well visualized. Aortic valve regurgitation is trivial. No aortic stenosis is present.  6. The inferior vena cava is dilated in size with <50% respiratory variability, suggesting right atrial pressure of 15 mmHg. FINDINGS  Left Ventricle: Left ventricular ejection fraction, by estimation, is 20 to 25%. The left ventricle has severely decreased function. The left ventricle demonstrates global hypokinesis. Definity contrast agent was given IV to delineate the left ventricular endocardial borders. The left ventricular internal cavity size was normal in size. There is mild left ventricular hypertrophy. Left ventricular diastolic parameters are consistent with Grade II diastolic dysfunction (pseudonormalization). Elevated left atrial pressure. Right Ventricle: The right ventricular size is normal. Right vetricular wall thickness was not well visualized. Right ventricular systolic function is mildly reduced. There is moderately elevated pulmonary artery systolic pressure. The tricuspid regurgitant velocity is 3.15 m/s, and with an assumed right atrial pressure of 15 mmHg, the estimated right ventricular systolic pressure is 60.1 mmHg. Left Atrium: Left atrial size was normal in size. Right Atrium: Right atrial size was normal in size. Pericardium: A small pericardial effusion is present. The pericardial effusion is circumferential. Mitral Valve: The mitral valve is normal in structure. Trivial mitral valve regurgitation. MV peak gradient, 7.2 mmHg. The mean mitral valve gradient is 3.0 mmHg. Tricuspid Valve: The tricuspid valve is normal in structure. Tricuspid  valve regurgitation is trivial. Aortic Valve: The aortic valve was not well visualized. Aortic valve regurgitation is trivial. No aortic stenosis is present. Aortic valve mean gradient measures 4.0 mmHg. Aortic valve peak gradient measures 8.5 mmHg. Aortic valve area, by VTI measures 1.59 cm. Pulmonic Valve: The pulmonic valve was not well visualized. Pulmonic valve regurgitation is not visualized. Aorta: The aortic root and ascending aorta are structurally normal, with no evidence of dilitation. Venous: The inferior vena cava is dilated in size with less than 50% respiratory variability, suggesting right atrial pressure of 15 mmHg. IAS/Shunts: No atrial level shunt detected by color flow Doppler.  LEFT VENTRICLE PLAX 2D LVIDd:         4.90 cm  Diastology LVIDs:         4.40 cm  LV e' medial:    6.30 cm/s LV PW:         1.30 cm  LV E/e' medial:  17.9 LV IVS:        1.10 cm  LV e' lateral:   8.10 cm/s LVOT diam:     2.00 cm  LV E/e' lateral: 14.0 LV SV:         34 LV SV Index:   16 LVOT Area:     3.14 cm  RIGHT VENTRICLE            IVC RV Basal diam:  3.30 cm    IVC diam: 2.10 cm RV S prime:     7.35 cm/s TAPSE (M-mode): 1.6 cm LEFT ATRIUM             Index       RIGHT ATRIUM           Index LA diam:        3.70 cm 1.71 cm/m  RA Area:     18.60 cm  LA Vol (A2C):   56.1 ml 25.93 ml/m RA Volume:   49.00 ml  22.65 ml/m LA Vol (A4C):   75.3 ml 34.80 ml/m LA Biplane Vol: 69.5 ml 32.12 ml/m  AORTIC VALVE AV Area (Vmax):    1.53 cm AV Area (Vmean):   1.54 cm AV Area (VTI):     1.59 cm AV Vmax:           146.00 cm/s AV Vmean:          90.200 cm/s AV VTI:            0.216 m AV Peak Grad:      8.5 mmHg AV Mean Grad:      4.0 mmHg LVOT Vmax:         71.30 cm/s LVOT Vmean:        44.200 cm/s LVOT VTI:          0.109 m LVOT/AV VTI ratio: 0.50  AORTA Ao Root diam: 3.00 cm Ao Asc diam:  3.40 cm MITRAL VALVE                TRICUSPID VALVE MV Area (PHT): 4.41 cm     TR Peak grad:   39.7 mmHg MV Area VTI:   1.24 cm     TR  Vmax:        315.00 cm/s MV Peak grad:  7.2 mmHg MV Mean grad:  3.0 mmHg     SHUNTS MV Vmax:       1.34 m/s     Systemic VTI:  0.11 m MV Vmean:      71.3 cm/s    Systemic Diam: 2.00 cm MV Decel Time: 172 msec MV E velocity: 113.00 cm/s MV A velocity: 64.00 cm/s MV E/A ratio:  1.77 Oswaldo Milian MD Electronically signed by Oswaldo Milian MD Signature Date/Time: 10/12/2020/6:17:52 PM    Final     Patient Profile     45 year old female with past medical history of hypertension, sleep apnea, diabetes mellitus, tobacco abuse for evaluation of acute heart failure. Chest CT shows no pulmonary embolus.  The pulmonary artery is enlarged suggesting pulmonary hypertension.  Small pericardial effusion.  There is also note of pulmonary edema and pleural effusions.  Echocardiogram shows ejection fraction 20 to 25%, mild left ventricular hypertrophy, grade 2 diastolic dysfunction, mild RV dysfunction, moderate pulmonary hypertension, small pericardial effusion.  Assessment & Plan    1 acute CHF-symptoms improving.  I/O-2390. Continue Lasix 40 mg IV twice daily and follow renal function.  Add spironolactone 12.5 mg daily. Echocardiogram shows severe LV dysfunction.  We will continue losartan today and transition to [facility] tomorrow.  Add coreg 3.125 mg BID. Add digoxin 0.125 mg daily. Etiology of cardiomyopathy unclear.  No history of alcohol abuse or recent viral symptoms.  Question contribution from hypertension.  Multiple risk factors for coronary disease.  Needs right and left cardiac catheterization.  I discussed the risk and benefits including myocardial infarction, CVA and death and she agrees to proceed.  We will arrange for later today.  We will need to fully titrate medications and once at goal repeat echocardiogram.  If ejection fraction less than 35% would need ICD.  2 hypertension-patient's blood pressure is mildly elevated.  Transition to [facility] tomorrow after being off of ACE inhibitor for  36 hours.  Add carvedilol 3.125 mg twice daily.  Titrate medications as tolerated.  3 dilated pulmonary artery-consistent with pulmonary hypertension.  Likely secondary to obesity hypoventilation syndrome, obstructive sleep apnea  and diastolic congestive heart failure.  4 tobacco abuse-patient previously counseled on discontinuing.  5 probable obstructive sleep apnea-we will need outpatient referral for management after discharge.  6 hyperlipidemia-continue statin.  7 diabetes mellitus-Per primary care.  For questions or updates, please contact [place] Please consult [email or web address] for contact info under        Signed, Kirk Ruths, MD  10/13/2020, 8:36 AM

## 2020-10-13 NOTE — Discharge Instructions (Signed)
Please finish your course of doxycycline however your leg swelling and shortness of breath are related to CHF.     Glucose Products:  ReliOnT glucose products raise low blood sugar fast. Tablets are free of fat, caffeine, sodium and gluten. They are portable and easy to carry, making it easier for people with diabetes to BE PREPARED for lows.  Glucose Tablets Available in 6 flavors . 10 ct...................................... $1.00 . 50 ct...................................... $3.98 Glucose Shot..................................$1.48 Glucose Gel....................................$3.44  Alcohol Swabs Alcohol swabs are used to sterilize your injection site. All of our swabs are individually wrapped for maximum safety, convenience and moisture retention. ReliOnT Alcohol Swabs . 100 ct Swabs..............................$1.00 . 400 ct Swabs..............................$3.74  Lancets ReliOnT offers three lancet options conveniently designed to work with almost every lancing device. Each features a protective disk, which guarantees sterility before testing. ReliOnT Lancets . 100 ct Lancets $1.56 . 200 ct Lancets $2.64 Available in Ultra-Thin, Thin & Micro-Thin ReliOnT 2-IN-1 Lancing Device . 50 ct Lancets..................................... $3.44 Available in 30 gauge and 25 gauge ReliOnT Lancing Device....................$5.84  Blood Glucose Monitors ReliOnT offers a full range of blood glucose testing options to provide an accurate, affordable system that meets each person's unique needs and preferences. Prime Meter....................................... $9.00 Prime Test Strips . 25 test strips.................................... $5.00 . 50 test strips.................................... $9.00 . 100 test strips.................................$17.88 Premier BLU Meter  ............  $18.98 Premier Voice Meter  .............  $14.98 Premier Test Strips . 50 test  strips.................................... $9.00 . 100 test strips.................................$17.88 Premier [employer]  ............  $19.44 Kit includes: . 50 test strips . 10 lancets . Lancing device . Carry case  Ketone Test Strips . 50 test strips  ................  $6.64  Human Insulin  Novolin/ReliOnT (recombinant DNA origin) is manufactured for [employer] by [employer] Insulin* with Vial..........$24.88 Available in N, R, 70/30 Novolin/ReliOnT Insulin Pens*  .....  $42.88 Available in N, R, 70/30  Insulin Delivery ReliOnT syringes and pen needles provide precision technology, comfort and accuracy in insulin delivery at affordable prices. ReliOnT Pen Needles* . 50 ct....................................................$9.00 Available in 39m, 675m 61m73m 92m24mliOnT Insulin Syringes* . 100 ct ............ $12.58 Available in 29G, 30G & 31G (3/10cc, 1/2cc & 1cc units)

## 2020-10-14 DIAGNOSIS — I5021 Acute systolic (congestive) heart failure: Secondary | ICD-10-CM | POA: Diagnosis not present

## 2020-10-14 LAB — BASIC METABOLIC PANEL
Anion gap: 9 (ref 5–15)
BUN: 12 mg/dL (ref 6–20)
CO2: 28 mmol/L (ref 22–32)
Calcium: 8.7 mg/dL — ABNORMAL LOW (ref 8.9–10.3)
Chloride: 100 mmol/L (ref 98–111)
Creatinine, Ser: 0.63 mg/dL (ref 0.44–1.00)
GFR, Estimated: >60 mL/min (ref >60)
Glucose, Bld: 150 mg/dL — ABNORMAL HIGH (ref 70–99)
Potassium: 4 mmol/L (ref 3.5–5.1)
Sodium: 137 mmol/L (ref 135–145)

## 2020-10-14 LAB — GLUCOSE, CAPILLARY
Glucose-Capillary: 156 mg/dL — ABNORMAL HIGH (ref 70–99)
Glucose-Capillary: 165 mg/dL — ABNORMAL HIGH (ref 70–99)
Glucose-Capillary: 178 mg/dL — ABNORMAL HIGH (ref 70–99)
Glucose-Capillary: 210 mg/dL — ABNORMAL HIGH (ref 70–99)

## 2020-10-14 LAB — MAGNESIUM: Magnesium: 1.7 mg/dL (ref 1.7–2.4)

## 2020-10-14 MED ORDER — ATORVASTATIN CALCIUM 80 MG PO TABS
80.0000 mg | ORAL_TABLET | Freq: Every day | ORAL | Status: DC
Start: 2020-10-14 — End: 2020-10-16
  Administered 2020-10-14 – 2020-10-15 (×2): 80 mg via ORAL
  Filled 2020-10-14 (×2): qty 1

## 2020-10-14 MED ORDER — MAGNESIUM SULFATE 2 GM/50ML IV SOLN
2.0000 g | Freq: Once | INTRAVENOUS | Status: AC
  Administered 2020-10-14: 2 g via INTRAVENOUS
  Filled 2020-10-14: qty 50

## 2020-10-14 MED ORDER — SACUBITRIL-VALSARTAN 24-26 MG PO TABS
1.0000 | ORAL_TABLET | Freq: Two times a day (BID) | ORAL | Status: DC
  Administered 2020-10-14 (×2): 1 via ORAL
  Filled 2020-10-14 (×2): qty 1

## 2020-10-14 MED ORDER — SPIRONOLACTONE 25 MG PO TABS
25.0000 mg | ORAL_TABLET | Freq: Every day | ORAL | Status: DC
  Administered 2020-10-14 – 2020-10-16 (×3): 25 mg via ORAL
  Filled 2020-10-14 (×3): qty 1

## 2020-10-14 NOTE — Progress Notes (Addendum)
PROGRESS NOTE    Catherine Espinoza  CLE:[id number] DOB: 1976-04-06 DOA: 10/11/2020 PCP: [name], NP    Brief Narrative:  Catherine Espinoza is a 45 y.o. female with history of diabetes mellitus type 2, morbid obesity, hypertension who has not been compliant with her medications due to patient having financial issues has been experiencing increasing swelling in the lower extremities more on the right side with pain for which patient's primary care physician had placed her on doxycycline despite taking which the swelling and pain has not improved.  And patient presents to the ER at [facility].  Denies fever chills chest pain shortness of breath nausea vomiting or diarrhea.  Patient states she may have lost weight over the last 1 year. Acute systolic heart failure and pulmonary hyperteinsion: 2D echo with EF of 20-25%. HF Cardiology team following. Remains on IV Lasix for diuresis.  Underwent Heart cath with PTCA/DES x1 of mid LAD. Will need to continue DAPT ASA and Plavix for at least 6 months.    Assessment & Plan:   Principal Problem:   CHF, acute ([place]) Active Problems:   Diabetes type 2, uncontrolled ([place])   MORBID OBESITY   TOBACCO ABUSE   Hypertension   Acute CHF (congestive heart failure) (HCC)   Coronary artery disease involving native coronary artery of native heart with unstable angina pectoris ([facility])  Acute systolic HF Small pericardial effusion NICM and ICM CAD s/p PCI to mid LAD With EF of <20% with severe ischemic and NICM per cardiac cath on 1/14.  Starting BB, spironolactone, ARB, digoxin.On statin, ASA, and not Plavix post PCI.  Will start entresto tomorrow.  Appreciate HF team following.  Will need evaluation for ICD.  1/15 Will continue with diuresis, monitor electrolytes. Her BL LE is improving.   DM2 Hgb A1c of 13.5, uncontrolled Had hypoglycemia this afternoon after cardiac cath but overall blood sugars had been stable on Lantus 30 units daily,  lispro 6 units w meals, and SSI.  Will need diabetes education.  1/15 Will need Rx for Reli-on 70/30 16 units BID on discharge as she is not able to afford her insurance co-pay for Lantus.   HTN BP stable, continue to monitor with newly started ARB, BB, spironolactone.  1/15 No change  Hypokalemia Potassium lower this afternoon Will provide repletion, repeat labs in am, check magnesium, replete as needed.  1/15 Potassium wnl  Low Magnesium 1/15 Ordered repletion today, repeat labs in am  Tobacco use Continue nicotine patch.   Hidradenitis  Continue doxycycline, has axillary abscess. Monitor.    Morbid obesity BMI 40.6 Continue lifestyle modification counseling.   DVT prophylaxis: Lovenox Code Status: Full Family Communication: SO at the bedside Disposition Plan:  Patient from home Anticipated dc to home No medically ready for dc Will be ready for dc in >3 days.    Consultants:   Cardiology, HF  Procedures:  1/14 Heart Cath with PTCA/DES x1 of mid LAD Antimicrobials:  doxycycline  Subjective: Patient reports that her LE edema continues to improved. She starting to feel better. Denies chest pain.   Objective:  Intake/Output Summary (Last 24 hours) at 10/14/2020 1915 Last data filed at 10/14/2020 1100 Gross per 24 hour  Intake 240 ml  Output 3400 ml  Net -3160 ml   Filed Weights   10/12/20 0613 10/13/20 0518 10/14/20 0533  Weight: 112 kg 110.7 kg 108.2 kg    Examination:  General exam: Sitting up in bed, NAD. Respiratory system: No cough. Respiratory  effort normal. Cardiovascular system: S1 & S2 preset, RRR. BL LE edema Gastrointestinal system: Abdomen is nondistended, soft and nontender.  Central nervous system: Alert and oriented. No focal neurological deficits. Extremities: Symmetric 5 x 5 power. Skin: No rashes, lesions or ulcers Psychiatry: Mood & affect appropriate.     Data Reviewed: I have personally reviewed following labs and imaging  studies and reports  CBC: Recent Labs  Lab 10/11/20 1128 10/12/20 0145 10/13/20 1404 10/13/20 1410 10/13/20 1639  WBC 11.8* 10.0  --   --  5.9  NEUTROABS 8.1* 5.8  --   --   --   HGB 14.3 13.4 13.6 12.6  13.6 13.8  HCT 43.6 40.8 40.0 37.0  40.0 42.1  MCV 90.6 91.1  --   --  90.0  PLT 235 214  --   --  0000000   Basic Metabolic Panel: Recent Labs  Lab 10/11/20 1128 10/12/20 0145 10/13/20 0427 10/13/20 1404 10/13/20 1410 10/13/20 1639 10/14/20 0315  NA 133* 136 139 142 144  142  --  137  K 3.8 3.7 3.7 3.5 2.8*  3.4*  --  4.0  CL 100 101 101  --   --   --  100  CO2 25 26 26   --   --   --  28  GLUCOSE 334* 189* 63*  --   --   --  150*  BUN 9 8 14   --   --   --  12  CREATININE 0.60 0.67 0.61  --   --  0.62 0.63  CALCIUM 8.7* 8.3* 8.7*  --   --   --  8.7*  MG  --  1.3* 1.8  --   --   --  1.7   GFR: Estimated Creatinine Clearance: 109.8 mL/min (by C-G formula based on SCr of 0.63 mg/dL). Liver Function Tests: Recent Labs  Lab 10/12/20 0145  AST 11*  ALT 20  ALKPHOS 84  BILITOT 0.5  PROT 5.9*  ALBUMIN 2.0*   No results for input(s): LIPASE, AMYLASE in the last 168 hours. No results for input(s): AMMONIA in the last 168 hours. Coagulation Profile: No results for input(s): INR, PROTIME in the last 168 hours. Cardiac Enzymes: No results for input(s): CKTOTAL, CKMB, CKMBINDEX, TROPONINI in the last 168 hours. BNP (last 3 results) No results for input(s): PROBNP in the last 8760 hours. HbA1C: No results for input(s): HGBA1C in the last 72 hours. CBG: Recent Labs  Lab 10/13/20 1612 10/13/20 2111 10/14/20 0811 10/14/20 1158 10/14/20 1703  GLUCAP 58* 189* 156* 165* 178*   Lipid Profile: No results for input(s): CHOL, HDL, LDLCALC, TRIG, CHOLHDL, LDLDIRECT in the last 72 hours. Thyroid Function Tests: Recent Labs    10/12/20 0145  TSH 1.477   Anemia Panel: No results for input(s): VITAMINB12, FOLATE, FERRITIN, TIBC, IRON, RETICCTPCT in the last 72  hours. Sepsis Labs: No results for input(s): PROCALCITON, LATICACIDVEN in the last 168 hours.  Recent Results (from the past 240 hour(s))  Resp Panel by RT-PCR (Flu A&B, Covid) Nasopharyngeal Swab     Status: None   Collection Time: 10/11/20  5:24 PM   Specimen: Nasopharyngeal Swab; Nasopharyngeal(NP) swabs in vial transport medium  Result Value Ref Range Status   SARS Coronavirus 2 by RT PCR NEGATIVE NEGATIVE Final    Comment: (NOTE) SARS-CoV-2 target nucleic acids are NOT DETECTED.  The SARS-CoV-2 RNA is generally detectable in upper respiratory specimens during the acute phase of infection. The lowest concentration of  SARS-CoV-2 viral copies this assay can detect is 138 copies/mL. A negative result does not preclude SARS-Cov-2 infection and should not be used as the sole basis for treatment or other patient management decisions. A negative result may occur with  improper specimen collection/handling, submission of specimen other than nasopharyngeal swab, presence of viral mutation(s) within the areas targeted by this assay, and inadequate number of viral copies(<138 copies/mL). A negative result must be combined with clinical observations, patient history, and epidemiological information. The expected result is Negative.  Fact Sheet for Patients:  EntrepreneurPulse.com.au  Fact Sheet for Healthcare Providers:  IncredibleEmployment.be  This test is no t yet approved or cleared by the [place] FDA and  has been authorized for detection and/or diagnosis of SARS-CoV-2 by FDA under an Emergency Use Authorization (EUA). This EUA will remain  in effect (meaning this test can be used) for the duration of the COVID-19 declaration under Section 564(b)(1) of the Act, 21 U.S.C.section 360bbb-3(b)(1), unless the authorization is terminated  or revoked sooner.       Influenza A by PCR NEGATIVE NEGATIVE Final   Influenza B by PCR NEGATIVE  NEGATIVE Final    Comment: (NOTE) The Xpert Xpress SARS-CoV-2/FLU/RSV plus assay is intended as an aid in the diagnosis of influenza from Nasopharyngeal swab specimens and should not be used as a sole basis for treatment. Nasal washings and aspirates are unacceptable for Xpert Xpress SARS-CoV-2/FLU/RSV testing.  Fact Sheet for Patients: EntrepreneurPulse.com.au  Fact Sheet for Healthcare Providers: IncredibleEmployment.be  This test is not yet approved or cleared by the [place] FDA and has been authorized for detection and/or diagnosis of SARS-CoV-2 by FDA under an Emergency Use Authorization (EUA). This EUA will remain in effect (meaning this test can be used) for the duration of the COVID-19 declaration under Section 564(b)(1) of the Act, 21 U.S.C. section 360bbb-3(b)(1), unless the authorization is terminated or revoked.  Performed at [facility], [address], [place], [place] [address]          Radiology Studies: CARDIAC CATHETERIZATION  Result Date: 10/13/2020  Mid LAD-1 lesion is 40% stenosed.  Mid LAD-2 lesion is 80% stenosed.  A drug-eluting stent was successfully placed using a SYNERGY XD [id number].  Post intervention, there is a 0% residual stenosis.  Post intervention, there is a 0% residual stenosis.  1. Severe mid LAD stenosis 2. Successful PTCA/DES x 1 mid LAD. Recommendations: Continue DAPT with ASA and Plavix for at least six months.   CARDIAC CATHETERIZATION  Result Date: 10/13/2020  Prox RCA lesion is 30% stenosed.  RPDA lesion is 95% stenosed.  RPAV lesion is 99% stenosed.  Prox LAD to Mid LAD lesion is 40% stenosed.  Mid LAD lesion is 80% stenosed.  2nd Diag lesion is 50% stenosed.  Findings: Ao = 147/97 (119) LV =  140/27 RA =  12 RV = 68/15 PA = 72/37 (51) PCW = 34 Fick cardiac output/index = 6.3/2.9 PVR = 2.6 WU FA sat = 97% PA sat = 72%, 72% Assessment: 1. 2v CAD with high grade lesions in  the mid LAD and distal RCA 2. 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 Markedly elevated filling pressures with normal cardiac output Plan/Discussion: Plan PCI of LAD followed by aggressive diuresis and titration of GDMT. Glori Bickers, MD 2:57 PM        Scheduled Meds: . aspirin  81 mg Oral Daily  . atorvastatin  80 mg Oral Daily  . carvedilol  3.125  mg Oral BID WC  . clopidogrel  75 mg Oral Daily  . digoxin  0.125 mg Oral Daily  . doxycycline  100 mg Oral Q12H  . enoxaparin (LOVENOX) injection  40 mg Subcutaneous Q24H  . furosemide  80 mg Intravenous BID  . insulin aspart  0-9 Units Subcutaneous TID WC  . insulin aspart  6 Units Subcutaneous TID WC  . insulin glargine  30 Units Subcutaneous Daily  . nicotine  21 mg Transdermal Daily  . potassium chloride  40 mEq Oral Daily  . sacubitril-valsartan  1 tablet Oral BID  . sodium chloride flush  3 mL Intravenous Q12H  . sodium chloride flush  3 mL Intravenous Q12H  . spironolactone  25 mg Oral Daily   Continuous Infusions: . sodium chloride       LOS: 3 days    Time spent: 25 minutes    Blain Pais, MD [facility]   If 7PM-7AM, please contact night-coverage [email or web address] Password [facility] 10/14/2020, 7:15 PM

## 2020-10-14 NOTE — Progress Notes (Signed)
Progress Note  Patient Name: Catherine Espinoza Date of Encounter: 10/14/2020  [facility] Cardiologist: New  Subjective   Pt denies CP or dyspnea  Inpatient Medications    Scheduled Meds: . aspirin  81 mg Oral Daily  . atorvastatin  40 mg Oral Daily  . carvedilol  3.125 mg Oral BID WC  . clopidogrel  75 mg Oral Daily  . digoxin  0.125 mg Oral Daily  . doxycycline  100 mg Oral Q12H  . enoxaparin (LOVENOX) injection  40 mg Subcutaneous Q24H  . furosemide  80 mg Intravenous BID  . insulin aspart  0-9 Units Subcutaneous TID WC  . insulin aspart  6 Units Subcutaneous TID WC  . insulin glargine  30 Units Subcutaneous Daily  . losartan  100 mg Oral Daily  . nicotine  21 mg Transdermal Daily  . potassium chloride  40 mEq Oral Daily  . sodium chloride flush  3 mL Intravenous Q12H  . sodium chloride flush  3 mL Intravenous Q12H  . spironolactone  12.5 mg Oral Daily   Continuous Infusions: . sodium chloride    . magnesium sulfate bolus IVPB     PRN Meds: sodium chloride, acetaminophen, HYDROcodone-acetaminophen, ondansetron (ZOFRAN) IV, sodium chloride flush   Vital Signs    Vitals:   10/13/20 1602 10/13/20 1759 10/13/20 2115 10/14/20 0533  BP: (!) 148/99 (!) 151/96 (!) 142/100 (!) 150/102  Pulse: 95 99 86 95  Resp: 17  16 11   Temp: 98.7 F (37.1 C)  98.2 F (36.8 C) 97.9 F (36.6 C)  TempSrc: Oral  Oral Oral  SpO2: 93%  98% 98%  Weight:    108.2 kg  Height:        Intake/Output Summary (Last 24 hours) at 10/14/2020 0750 Last data filed at 10/14/2020 0500 Gross per 24 hour  Intake 240 ml  Output 4175 ml  Net -3935 ml   Last 3 Weights 10/14/2020 10/13/2020 10/12/2020  Weight (lbs) 238 lb 9.6 oz 244 lb 247 lb  Weight (kg) 108.228 kg 110.678 kg 112.038 kg      Telemetry    Sinus - Personally Reviewed  Physical Exam   GEN: No acute distress.  WD obese Neck: supple Cardiac: RRR Respiratory: Clear to auscultation bilaterally; no wheeze GI: Soft, NT/ND  MS:  1+ edema Neuro:  Grossly intact Psych: Normal affect   Labs    High Sensitivity Troponin:   Recent Labs  Lab 10/11/20 1244 10/11/20 1432  TROPONINIHS 18* 18*      Chemistry Recent Labs  Lab 10/12/20 0145 10/13/20 0427 10/13/20 1404 10/13/20 1410 10/13/20 1639 10/14/20 0315  NA 136 139 142 144  142  --  137  K 3.7 3.7 3.5 2.8*  3.4*  --  4.0  CL 101 101  --   --   --  100  CO2 26 26  --   --   --  28  GLUCOSE 189* 63*  --   --   --  150*  BUN 8 14  --   --   --  12  CREATININE 0.67 0.61  --   --  0.62 0.63  CALCIUM 8.3* 8.7*  --   --   --  8.7*  PROT 5.9*  --   --   --   --   --   ALBUMIN 2.0*  --   --   --   --   --   AST 11*  --   --   --   --   --  ALT 20  --   --   --   --   --   ALKPHOS 84  --   --   --   --   --   BILITOT 0.5  --   --   --   --   --   GFRNONAA >60 >60  --   --  >60 >60  ANIONGAP 9 12  --   --   --  9     Hematology Recent Labs  Lab 10/11/20 1128 10/12/20 0145 10/13/20 1404 10/13/20 1410 10/13/20 1639  WBC 11.8* 10.0  --   --  5.9  RBC 4.81 4.48  --   --  4.68  HGB 14.3 13.4 13.6 12.6  13.6 13.8  HCT 43.6 40.8 40.0 37.0  40.0 42.1  MCV 90.6 91.1  --   --  90.0  MCH 29.7 29.9  --   --  29.5  MCHC 32.8 32.8  --   --  32.8  RDW 13.4 13.7  --   --  13.6  PLT 235 214  --   --  249    BNP Recent Labs  Lab 10/11/20 1128  BNP 501.2*     DDimer  Recent Labs  Lab 10/11/20 1244  DDIMER 0.88*     Radiology    CARDIAC CATHETERIZATION  Result Date: 10/13/2020  Mid LAD-1 lesion is 40% stenosed.  Mid LAD-2 lesion is 80% stenosed.  A drug-eluting stent was successfully placed using a SYNERGY XD 3.0X28.  Post intervention, there is a 0% residual stenosis.  Post intervention, there is a 0% residual stenosis.  1. Severe mid LAD stenosis 2. Successful PTCA/DES x 1 mid LAD. Recommendations: Continue DAPT with ASA and Plavix for at least six months.   CARDIAC CATHETERIZATION  Result Date: 10/13/2020  Prox RCA lesion is 30%  stenosed.  RPDA lesion is 95% stenosed.  RPAV lesion is 99% stenosed.  Prox LAD to Mid LAD lesion is 40% stenosed.  Mid LAD lesion is 80% stenosed.  2nd Diag lesion is 50% stenosed.  Findings: Ao = 147/97 (119) LV =  140/27 RA =  12 RV = 68/15 PA = 72/37 (51) PCW = 34 Fick cardiac output/index = 6.3/2.9 PVR = 2.6 WU FA sat = 97% PA sat = 72%, 72% Assessment: 1. 2v CAD with high grade lesions in the mid LAD and distal RCA 2. 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 Markedly elevated filling pressures with normal cardiac output Plan/Discussion: Plan PCI of LAD followed by aggressive diuresis and titration of GDMT. Glori Bickers, MD 2:57 PM   VAS [place] ABI WITH/WO TBI  Result Date: 10/13/2020 LOWER EXTREMITY DOPPLER STUDY Indications: Rest pain. High Risk Factors: Hypertension, hyperlipidemia, Diabetes.  Comparison Study: No prior studies. Performing Technologist: Carlos Levering RVT  Examination Guidelines: A complete evaluation includes at minimum, Doppler waveform signals and systolic blood pressure reading at the level of bilateral brachial, anterior tibial, and posterior tibial arteries, when vessel segments are accessible. Bilateral testing is considered an integral part of a complete examination. Photoelectric Plethysmograph (PPG) waveforms and toe systolic pressure readings are included as required and additional duplex testing as needed. Limited examinations for reoccurring indications may be performed as noted.  ABI Findings: +---------+------------------+-----+---------+--------+ Right    Rt Pressure (mmHg)IndexWaveform Comment  +---------+------------------+-----+---------+--------+ Brachial 148                    triphasic         +---------+------------------+-----+---------+--------+  PTA      149               1.01 triphasic         +---------+------------------+-----+---------+--------+ DP       146               0.99 triphasic          +---------+------------------+-----+---------+--------+ Great Toe140               0.95                   +---------+------------------+-----+---------+--------+ +---------+------------------+-----+---------+-------+ Left     Lt Pressure (mmHg)IndexWaveform Comment +---------+------------------+-----+---------+-------+ Brachial 145                    triphasic        +---------+------------------+-----+---------+-------+ PTA      108               0.73 biphasic         +---------+------------------+-----+---------+-------+ DP       103               0.70 biphasic         +---------+------------------+-----+---------+-------+ Great Toe91                0.61                  +---------+------------------+-----+---------+-------+ +-------+-----------+-----------+------------+------------+ ABI/TBIToday's ABIToday's TBIPrevious ABIPrevious TBI +-------+-----------+-----------+------------+------------+ Right  1.01       0.95                                +-------+-----------+-----------+------------+------------+ Left   0.73       0.61                                +-------+-----------+-----------+------------+------------+  Summary: Right: Resting right ankle-brachial index is within normal range. No evidence of significant right lower extremity arterial disease. The right toe-brachial index is normal. Left: Resting left ankle-brachial index indicates moderate left lower extremity arterial disease. The left toe-brachial index is abnormal.  *See table(s) above for measurements and observations.  Electronically signed by Jamelle Haring on 10/13/2020 at 2:56:26 PM.    Final    ECHOCARDIOGRAM COMPLETE  Result Date: 10/12/2020    ECHOCARDIOGRAM REPORT   Patient Name:   Catherine Espinoza Date of Exam: 10/12/2020 Medical Rec #:  [id number]      Height:       65.0 in Accession #:    [id number]     Weight:       247.0 lb Date of Birth:  17-Aug-1976      BSA:          2.164 m  Patient Age:    45 years       BP:           133/96 mmHg Patient Gender: F              HR:           98 bpm. Exam Location:  Inpatient Procedure: 2D Echo, Cardiac Doppler, Color Doppler and Intracardiac            Opacification Agent Indications:    CHF  History:        Patient has prior history of Echocardiogram examinations. Risk  Factors:Hypertension, Sleep Apnea, Diabetes and Current Smoker.  Sonographer:    Clayton Lefort RDCS (AE) Referring Phys: [place]  1. Left ventricular ejection fraction, by estimation, is 20 to 25%. The left ventricle has severely decreased function. The left ventricle demonstrates global hypokinesis. There is mild left ventricular hypertrophy. Left ventricular diastolic parameters  are consistent with Grade II diastolic dysfunction (pseudonormalization). Elevated left atrial pressure.  2. Right ventricular systolic function is mildly reduced. The right ventricular size is normal. There is moderately elevated pulmonary artery systolic pressure. The estimated right ventricular systolic pressure is [id number] mmHg.  3. A small pericardial effusion is present. The pericardial effusion is circumferential.  4. The mitral valve is normal in structure. Trivial mitral valve regurgitation.  5. The aortic valve was not well visualized. Aortic valve regurgitation is trivial. No aortic stenosis is present.  6. The inferior vena cava is dilated in size with <50% respiratory variability, suggesting right atrial pressure of 15 mmHg. FINDINGS  Left Ventricle: Left ventricular ejection fraction, by estimation, is 20 to 25%. The left ventricle has severely decreased function. The left ventricle demonstrates global hypokinesis. Definity contrast agent was given IV to delineate the left ventricular endocardial borders. The left ventricular internal cavity size was normal in size. There is mild left ventricular hypertrophy. Left ventricular diastolic parameters are  consistent with Grade II diastolic dysfunction (pseudonormalization). Elevated left atrial pressure. Right Ventricle: The right ventricular size is normal. Right vetricular wall thickness was not well visualized. Right ventricular systolic function is mildly reduced. There is moderately elevated pulmonary artery systolic pressure. The tricuspid regurgitant velocity is 3.15 m/s, and with an assumed right atrial pressure of 15 mmHg, the estimated right ventricular systolic pressure is [id number] mmHg. Left Atrium: Left atrial size was normal in size. Right Atrium: Right atrial size was normal in size. Pericardium: A small pericardial effusion is present. The pericardial effusion is circumferential. Mitral Valve: The mitral valve is normal in structure. Trivial mitral valve regurgitation. MV peak gradient, 7.2 mmHg. The mean mitral valve gradient is 3.0 mmHg. Tricuspid Valve: The tricuspid valve is normal in structure. Tricuspid valve regurgitation is trivial. Aortic Valve: The aortic valve was not well visualized. Aortic valve regurgitation is trivial. No aortic stenosis is present. Aortic valve mean gradient measures 4.0 mmHg. Aortic valve peak gradient measures 8.5 mmHg. Aortic valve area, by VTI measures 1.59 cm. Pulmonic Valve: The pulmonic valve was not well visualized. Pulmonic valve regurgitation is not visualized. Aorta: The aortic root and ascending aorta are structurally normal, with no evidence of dilitation. Venous: The inferior vena cava is dilated in size with less than 50% respiratory variability, suggesting right atrial pressure of 15 mmHg. IAS/Shunts: No atrial level shunt detected by color flow Doppler.  LEFT VENTRICLE PLAX 2D LVIDd:         4.90 cm  Diastology LVIDs:         4.40 cm  LV e' medial:    6.30 cm/s LV PW:         1.30 cm  LV E/e' medial:  17.9 LV IVS:        1.10 cm  LV e' lateral:   8.10 cm/s LVOT diam:     2.00 cm  LV E/e' lateral: 14.0 LV SV:         34 LV SV Index:   16 LVOT Area:      3.14 cm  RIGHT VENTRICLE  IVC RV Basal diam:  3.30 cm    IVC diam: 2.10 cm RV S prime:     7.35 cm/s TAPSE (M-mode): 1.6 cm LEFT ATRIUM             Index       RIGHT ATRIUM           Index LA diam:        3.70 cm 1.71 cm/m  RA Area:     18.60 cm LA Vol (A2C):   56.1 ml 25.93 ml/m RA Volume:   49.00 ml  22.65 ml/m LA Vol (A4C):   75.3 ml 34.80 ml/m LA Biplane Vol: 69.5 ml 32.12 ml/m  AORTIC VALVE AV Area (Vmax):    1.53 cm AV Area (Vmean):   1.54 cm AV Area (VTI):     1.59 cm AV Vmax:           146.00 cm/s AV Vmean:          90.200 cm/s AV VTI:            0.216 m AV Peak Grad:      8.5 mmHg AV Mean Grad:      4.0 mmHg LVOT Vmax:         71.30 cm/s LVOT Vmean:        44.200 cm/s LVOT VTI:          0.109 m LVOT/AV VTI ratio: 0.50  AORTA Ao Root diam: 3.00 cm Ao Asc diam:  3.40 cm MITRAL VALVE                TRICUSPID VALVE MV Area (PHT): 4.41 cm     TR Peak grad:   39.7 mmHg MV Area VTI:   1.24 cm     TR Vmax:        315.00 cm/s MV Peak grad:  7.2 mmHg MV Mean grad:  3.0 mmHg     SHUNTS MV Vmax:       1.34 m/s     Systemic VTI:  0.11 m MV Vmean:      71.3 cm/s    Systemic Diam: 2.00 cm MV Decel Time: 172 msec MV E velocity: 113.00 cm/s MV A velocity: 64.00 cm/s MV E/A ratio:  1.77 Oswaldo Milian MD Electronically signed by Oswaldo Milian MD Signature Date/Time: 10/12/2020/6:17:52 PM    Final     Patient Profile     45 year old female with past medical history of hypertension, sleep apnea, diabetes mellitus, tobacco abuse for evaluation of acute heart failure. Chest CT shows no pulmonary embolus.  The pulmonary artery is enlarged suggesting pulmonary hypertension.  Small pericardial effusion.  There is also note of pulmonary edema and pleural effusions.  Echocardiogram shows ejection fraction 20 to 25%, mild left ventricular hypertrophy, grade 2 diastolic dysfunction, mild RV dysfunction, moderate pulmonary hypertension, small pericardial effusion.  Cardiac catheterization January  14 showed 95% PDA, 99% RV branch, 80% mid LAD and 50% second diagonal.  Pulmonary capillary wedge pressure 34.  Patient had PCI of mid LAD with drug-eluting stent.  Assessment & Plan    1 acute CHF-symptoms continue to improve.  I/O-3935. Wt 108.2 kg. PCWP 34 at time of catheterization.  Echo shows severe LV dysfunction.  Continue Lasix 80 mg IV twice daily and follow renal function.  Increase spironolactone to 25 mg daily.  Discontinue losartan and add Entresto 24/26 twice daily (>36 hours since ACE-I).  Titrate as tolerated by blood pressure.  Continue coreg 3.125 mg BID;  will advance later as CHF improves.  Continue digoxin. Etiology of cardiomyopathy appears to be mixed ischemic and nonischemic.  Now status post PCI of LAD.  There may be a contribution from hypertension.  No history of alcohol abuse.  We will need to fully titrate medications and once at goal repeat echocardiogram.  If ejection fraction less than 35% would need ICD.  2 coronary artery disease-now status post PCI of LAD.  Continue aspirin, Plavix and statin.  3 hypertension-patient's blood pressure is elevated.  Change losartan to Entresto and increase spironolactone.  We will continue to advance medications as needed.  4 dilated pulmonary artery-consistent with pulmonary hypertension.  Likely secondary to obesity hypoventilation syndrome, obstructive sleep apnea and diastolic congestive heart failure.  5 tobacco abuse-patient previously counseled on discontinuing.  6 probable obstructive sleep apnea-we will need outpatient referral for management after discharge.  7 hyperlipidemia-given documented coronary disease will increase Lipitor to 80 mg daily.  Check lipids and liver in 12 weeks.  8 diabetes mellitus-Per primary care.  9 peripheral vascular disease-ABIs this admission moderate on the left.  Continue aspirin and statin.  For questions or updates, please contact [place] Please consult [email or web address] for  contact info under        Signed, Kirk Ruths, MD  10/14/2020, 7:50 AM

## 2020-10-15 DIAGNOSIS — I5021 Acute systolic (congestive) heart failure: Secondary | ICD-10-CM | POA: Diagnosis not present

## 2020-10-15 LAB — GLUCOSE, CAPILLARY
Glucose-Capillary: 204 mg/dL — ABNORMAL HIGH (ref 70–99)
Glucose-Capillary: 113 mg/dL — ABNORMAL HIGH (ref 70–99)
Glucose-Capillary: 138 mg/dL — ABNORMAL HIGH (ref 70–99)
Glucose-Capillary: 168 mg/dL — ABNORMAL HIGH (ref 70–99)

## 2020-10-15 LAB — BASIC METABOLIC PANEL
Anion gap: 10 (ref 5–15)
BUN: 13 mg/dL (ref 6–20)
CO2: 27 mmol/L (ref 22–32)
Calcium: 9 mg/dL (ref 8.9–10.3)
Chloride: 98 mmol/L (ref 98–111)
Creatinine, Ser: 0.76 mg/dL (ref 0.44–1.00)
GFR, Estimated: >60 mL/min (ref >60)
Glucose, Bld: 210 mg/dL — ABNORMAL HIGH (ref 70–99)
Potassium: 4.4 mmol/L (ref 3.5–5.1)
Sodium: 135 mmol/L (ref 135–145)

## 2020-10-15 LAB — MAGNESIUM: Magnesium: 1.6 mg/dL — ABNORMAL LOW (ref 1.7–2.4)

## 2020-10-15 MED ORDER — SACUBITRIL-VALSARTAN 49-51 MG PO TABS
1.0000 | ORAL_TABLET | Freq: Two times a day (BID) | ORAL | Status: DC
  Administered 2020-10-15 – 2020-10-16 (×3): 1 via ORAL
  Filled 2020-10-15 (×4): qty 1

## 2020-10-15 MED ORDER — INSULIN GLARGINE 100 UNIT/ML ~~LOC~~ SOLN
35.0000 [IU] | Freq: Every day | SUBCUTANEOUS | Status: DC
  Administered 2020-10-16: 35 [IU] via SUBCUTANEOUS
  Filled 2020-10-15: qty 0.35

## 2020-10-15 MED ORDER — MAGNESIUM SULFATE 2 GM/50ML IV SOLN
2.0000 g | Freq: Once | INTRAVENOUS | Status: AC
  Administered 2020-10-15: 2 g via INTRAVENOUS
  Filled 2020-10-15: qty 50

## 2020-10-15 NOTE — Progress Notes (Signed)
Medication costs

## 2020-10-15 NOTE — Progress Notes (Signed)
PROGRESS NOTE    Catherine Espinoza  WCH:[id number] DOB: 04/09/1976 DOA: 10/11/2020 PCP: [name], NP    Brief Narrative:  Catherine Espinoza is a 45 y.o. female with history of diabetes mellitus type 2, morbid obesity, hypertension who has not been compliant with her medications due to patient having financial issues has been experiencing increasing swelling in the lower extremities more on the right side with pain for which patient's primary care physician had placed her on doxycycline despite taking which the swelling and pain has not improved.  And patient presents to the ER at [facility].  Denies fever chills chest pain shortness of breath nausea vomiting or diarrhea.  Patient states she may have lost weight over the last 1 year. Acute systolic heart failure and pulmonary hyperteinsion: 2D echo with EF of 20-25%. HF Cardiology team following. Remains on IV Lasix for diuresis.  Underwent Heart cath with PTCA/DES x1 of mid LAD. Will need to continue DAPT ASA and Plavix for at least 6 months.    Assessment & Plan:   Principal Problem:   CHF, acute ([place]) Active Problems:   Diabetes type 2, uncontrolled ([place])   MORBID OBESITY   TOBACCO ABUSE   Hypertension   Acute CHF (congestive heart failure) (HCC)   Coronary artery disease involving native coronary artery of native heart with unstable angina pectoris (HCC)  Acute systolic HF Small pericardial effusion NICM and ICM CAD s/p PCI to mid LAD With EF of <20% with severe ischemic and NICM per cardiac cath on 1/14.  Starting BB, spironolactone, ARB, digoxin.On statin, ASA, and not Plavix post PCI.  Will start entresto tomorrow.  Appreciate HF team following.  Will need evaluation for ICD.  1/15 Will continue with diuresis, monitor electrolytes. Her BL LE is improving.   DM2 Hgb A1c of 13.5, uncontrolled Had hypoglycemia this afternoon after cardiac cath but overall blood sugars had been stable on Lantus 30 units daily,  lispro 6 units w meals, and SSI.  Will need diabetes education.  1/15 Will need Rx for Reli-on 70/30 16 units BID on discharge as she is not able to afford her insurance co-pay for Lantus.  1/16 I increased Lantus to 35 units.  See diabetes coordinator note of 10/12/2020, above 115 note and care management notes about outpatient issues with cost etc.  HTN BP stable, continue to monitor with newly started ARB, BB, spironolactone.  1/15 No change  Hypokalemia Potassium lower this afternoon Will provide repletion, repeat labs in am, check magnesium, replete as needed.  1/15 Potassium wnl same on January 16  Low Magnesium 1/15 Ordered repletion today, repeat labs in am Level was 1.6 on January 16 we will replete again  Tobacco use Continue nicotine patch.   Hidradenitis  Continue doxycycline, has axillary abscess. Monitor.  This is improving denies significant pain draining in the right axilla  Morbid obesity BMI 40.6 Continue lifestyle modification counseling.    Nonadherence to medications due to drug costs  This is being addressed   DVT prophylaxis: Lovenox Code Status: Full Family Communication: SO at the bedside Disposition Plan:  Patient from home Anticipated dc to home Anticipate discharge tomorrow   Consultants:   Cardiology, HF  Procedures:  1/14 Heart Cath with PTCA/DES x1 of mid LAD Antimicrobials:  doxycycline  Subjective: Feeling much better.  Says no more edema.  Orthopnea resolved.  No dyspnea on exertion.  She is ambulating in the room without difficulty. Objective:  Intake/Output Summary (Last 24 hours) at  10/15/2020 1346 Last data filed at 10/15/2020 1200 Gross per 24 hour  Intake 720 ml  Output 3750 ml  Net -3030 ml   Filed Weights   10/13/20 0518 10/14/20 0533 10/15/20 0500  Weight: 110.7 kg 108.2 kg 104.8 kg    Examination:  General exam: Sitting up in bed, NAD. Respiratory system: Lungs are clear Cardiovascular system: Normal  S1-S2 no rubs murmurs or gallops thick neck I do not see any jugular venous distention trace peripheral edema pretibial and ankle right greater than left Gastrointestinal system: Abdomen soft and nontender bowel sounds present.  Central nervous system: Alert and oriented. No focal neurological deficits. Skin: Changes of hidradenitis suppurativa in bilateral axillary areas and also panniculus intertriginous.  Some thin purulent DC in the right axilla Psychiatry: Mood & affect appropriate.     Data Reviewed: I have personally reviewed following labs and imaging studies and reports  CBC: Recent Labs  Lab 10/11/20 1128 10/12/20 0145 10/13/20 1404 10/13/20 1410 10/13/20 1639  WBC 11.8* 10.0  --   --  5.9  NEUTROABS 8.1* 5.8  --   --   --   HGB 14.3 13.4 13.6 12.6  13.6 13.8  HCT 43.6 40.8 40.0 37.0  40.0 42.1  MCV 90.6 91.1  --   --  90.0  PLT 235 214  --   --  989   Basic Metabolic Panel: Recent Labs  Lab 10/11/20 1128 10/12/20 0145 10/13/20 0427 10/13/20 1404 10/13/20 1410 10/13/20 1639 10/14/20 0315 10/15/20 0237  NA 133* 136 139 142 144  142  --  137 135  K 3.8 3.7 3.7 3.5 2.8*  3.4*  --  4.0 4.4  CL 100 101 101  --   --   --  100 98  CO2 25 26 26   --   --   --  28 27  GLUCOSE 334* 189* 63*  --   --   --  150* 210*  BUN 9 8 14   --   --   --  12 13  CREATININE 0.60 0.67 0.61  --   --  0.62 0.63 0.76  CALCIUM 8.7* 8.3* 8.7*  --   --   --  8.7* 9.0  MG  --  1.3* 1.8  --   --   --  1.7 1.6*   GFR: Estimated Creatinine Clearance: 107.8 mL/min (by C-G formula based on SCr of 0.76 mg/dL). Liver Function Tests: Recent Labs  Lab 10/12/20 0145  AST 11*  ALT 20  ALKPHOS 84  BILITOT 0.5  PROT 5.9*  ALBUMIN 2.0*   No results for input(s): LIPASE, AMYLASE in the last 168 hours. No results for input(s): AMMONIA in the last 168 hours. Coagulation Profile: No results for input(s): INR, PROTIME in the last 168 hours. Cardiac Enzymes: No results for input(s): CKTOTAL,  CKMB, CKMBINDEX, TROPONINI in the last 168 hours. BNP (last 3 results) No results for input(s): PROBNP in the last 8760 hours. HbA1C: No results for input(s): HGBA1C in the last 72 hours. CBG: Recent Labs  Lab 10/14/20 1158 10/14/20 1703 10/14/20 2203 10/15/20 0858 10/15/20 1208  GLUCAP 165* 178* 210* 204* 113*   Recent Results (from the past 240 hour(s))  Resp Panel by RT-PCR (Flu A&B, Covid) Nasopharyngeal Swab     Status: None   Collection Time: 10/11/20  5:24 PM   Specimen: Nasopharyngeal Swab; Nasopharyngeal(NP) swabs in vial transport medium  Result Value Ref Range Status   SARS Coronavirus 2 by RT  PCR NEGATIVE NEGATIVE Final    Comment: (NOTE) SARS-CoV-2 target nucleic acids are NOT DETECTED.  The SARS-CoV-2 RNA is generally detectable in upper respiratory specimens during the acute phase of infection. The lowest concentration of SARS-CoV-2 viral copies this assay can detect is 138 copies/mL. A negative result does not preclude SARS-Cov-2 infection and should not be used as the sole basis for treatment or other patient management decisions. A negative result may occur with  improper specimen collection/handling, submission of specimen other than nasopharyngeal swab, presence of viral mutation(s) within the areas targeted by this assay, and inadequate number of viral copies(<138 copies/mL). A negative result must be combined with clinical observations, patient history, and epidemiological information. The expected result is Negative.  Fact Sheet for Patients:  EntrepreneurPulse.com.au  Fact Sheet for Healthcare Providers:  IncredibleEmployment.be  This test is no t yet approved or cleared by the [place] FDA and  has been authorized for detection and/or diagnosis of SARS-CoV-2 by FDA under an Emergency Use Authorization (EUA). This EUA will remain  in effect (meaning this test can be used) for the duration of the COVID-19  declaration under Section 564(b)(1) of the Act, 21 U.S.C.section 360bbb-3(b)(1), unless the authorization is terminated  or revoked sooner.       Influenza A by PCR NEGATIVE NEGATIVE Final   Influenza B by PCR NEGATIVE NEGATIVE Final    Comment: (NOTE) The Xpert Xpress SARS-CoV-2/FLU/RSV plus assay is intended as an aid in the diagnosis of influenza from Nasopharyngeal swab specimens and should not be used as a sole basis for treatment. Nasal washings and aspirates are unacceptable for Xpert Xpress SARS-CoV-2/FLU/RSV testing.  Fact Sheet for Patients: EntrepreneurPulse.com.au  Fact Sheet for Healthcare Providers: IncredibleEmployment.be  This test is not yet approved or cleared by the [place] FDA and has been authorized for detection and/or diagnosis of SARS-CoV-2 by FDA under an Emergency Use Authorization (EUA). This EUA will remain in effect (meaning this test can be used) for the duration of the COVID-19 declaration under Section 564(b)(1) of the Act, 21 U.S.C. section 360bbb-3(b)(1), unless the authorization is terminated or revoked.  Performed at [facility], [address] [place], [place] [address]          Radiology Studies: CARDIAC CATHETERIZATION  Result Date: 10/13/2020  Mid LAD-1 lesion is 40% stenosed.  Mid LAD-2 lesion is 80% stenosed.  A drug-eluting stent was successfully placed using a SYNERGY XD 3.0X28.  Post intervention, there is a 0% residual stenosis.  Post intervention, there is a 0% residual stenosis.  1. Severe mid LAD stenosis 2. Successful PTCA/DES x 1 mid LAD. Recommendations: Continue DAPT with ASA and Plavix for at least six months.   CARDIAC CATHETERIZATION  Result Date: 10/13/2020  Prox RCA lesion is 30% stenosed.  RPDA lesion is 95% stenosed.  RPAV lesion is 99% stenosed.  Prox LAD to Mid LAD lesion is 40% stenosed.  Mid LAD lesion is 80% stenosed.  2nd Diag lesion is 50%  stenosed.  Findings: Ao = 147/97 (119) LV =  140/27 RA =  12 RV = 68/15 PA = 72/37 (51) PCW = 34 Fick cardiac output/index = 6.3/2.9 PVR = 2.6 WU FA sat = 97% PA sat = 72%, 72% Assessment: 1. 2v CAD with high grade lesions in the mid LAD and distal RCA 2. 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 Markedly elevated filling pressures with normal cardiac output Plan/Discussion: Plan PCI of LAD followed by aggressive diuresis and  titration of GDMT. Glori Bickers, MD 2:57 PM        Scheduled Meds: . aspirin  81 mg Oral Daily  . atorvastatin  80 mg Oral Daily  . carvedilol  3.125 mg Oral BID WC  . clopidogrel  75 mg Oral Daily  . digoxin  0.125 mg Oral Daily  . doxycycline  100 mg Oral Q12H  . enoxaparin (LOVENOX) injection  40 mg Subcutaneous Q24H  . furosemide  80 mg Intravenous BID  . insulin aspart  0-9 Units Subcutaneous TID WC  . insulin aspart  6 Units Subcutaneous TID WC  . insulin glargine  30 Units Subcutaneous Daily  . nicotine  21 mg Transdermal Daily  . potassium chloride  40 mEq Oral Daily  . sacubitril-valsartan  1 tablet Oral BID  . sodium chloride flush  3 mL Intravenous Q12H  . sodium chloride flush  3 mL Intravenous Q12H  . spironolactone  25 mg Oral Daily   Continuous Infusions: . sodium chloride       LOS: 4 days    Time spent: 20 minutes    Silvano Rusk, MD [facility]   If 7PM-7AM, please contact night-coverage [email or web address] Password [email or web address] 10/15/2020, 1:46 PM

## 2020-10-15 NOTE — Progress Notes (Signed)
May need medications filled at discharge

## 2020-10-15 NOTE — Progress Notes (Signed)
Progress Note  Patient Name: Catherine Espinoza Date of Encounter: 10/15/2020  [facility] Cardiologist: New  Subjective   Pt denies CP or dyspnea  Inpatient Medications    Scheduled Meds: . aspirin  81 mg Oral Daily  . atorvastatin  80 mg Oral Daily  . carvedilol  3.125 mg Oral BID WC  . clopidogrel  75 mg Oral Daily  . digoxin  0.125 mg Oral Daily  . doxycycline  100 mg Oral Q12H  . enoxaparin (LOVENOX) injection  40 mg Subcutaneous Q24H  . furosemide  80 mg Intravenous BID  . insulin aspart  0-9 Units Subcutaneous TID WC  . insulin aspart  6 Units Subcutaneous TID WC  . insulin glargine  30 Units Subcutaneous Daily  . nicotine  21 mg Transdermal Daily  . potassium chloride  40 mEq Oral Daily  . sacubitril-valsartan  1 tablet Oral BID  . sodium chloride flush  3 mL Intravenous Q12H  . sodium chloride flush  3 mL Intravenous Q12H  . spironolactone  25 mg Oral Daily   Continuous Infusions: . sodium chloride     PRN Meds: sodium chloride, acetaminophen, HYDROcodone-acetaminophen, ondansetron (ZOFRAN) IV, sodium chloride flush   Vital Signs    Vitals:   10/14/20 2150 10/14/20 2210 10/15/20 0500 10/15/20 0512  BP: 139/89   (!) 148/99  Pulse: 96   80  Resp: (!) 22 17  14   Temp: 98.5 F (36.9 C)   97.8 F (36.6 C)  TempSrc: Oral   Oral  SpO2: 98%   94%  Weight:   104.8 kg   Height:        Intake/Output Summary (Last 24 hours) at 10/15/2020 0730 Last data filed at 10/15/2020 0518 Gross per 24 hour  Intake --  Output 3950 ml  Net -3950 ml   Last 3 Weights 10/15/2020 10/14/2020 10/13/2020  Weight (lbs) 231 lb 238 lb 9.6 oz 244 lb  Weight (kg) 104.781 kg 108.228 kg 110.678 kg      Telemetry    Sinus - Personally Reviewed  Physical Exam   GEN: WD, obese NAD Neck: JVP difficult to assess Cardiac: RRR, no rub Respiratory: CTA GI: Soft, NT/ND, no masses MS: 1+ edema Neuro:  No focal findings Psych: Normal affect   Labs    High Sensitivity Troponin:    Recent Labs  Lab 10/11/20 1244 10/11/20 1432  TROPONINIHS 18* 18*      Chemistry Recent Labs  Lab 10/12/20 0145 10/13/20 0427 10/13/20 1404 10/13/20 1410 10/13/20 1639 10/14/20 0315 10/15/20 0237  NA 136 139   < > 144  142  --  137 135  K 3.7 3.7   < > 2.8*  3.4*  --  4.0 4.4  CL 101 101  --   --   --  100 98  CO2 26 26  --   --   --  28 27  GLUCOSE 189* 63*  --   --   --  150* 210*  BUN 8 14  --   --   --  12 13  CREATININE 0.67 0.61  --   --  0.62 0.63 0.76  CALCIUM 8.3* 8.7*  --   --   --  8.7* 9.0  PROT 5.9*  --   --   --   --   --   --   ALBUMIN 2.0*  --   --   --   --   --   --  AST 11*  --   --   --   --   --   --   ALT 20  --   --   --   --   --   --   ALKPHOS 84  --   --   --   --   --   --   BILITOT 0.5  --   --   --   --   --   --   GFRNONAA >60 >60  --   --  >60 >60 >60  ANIONGAP 9 12  --   --   --  9 10   < > = values in this interval not displayed.     Hematology Recent Labs  Lab 10/11/20 1128 10/12/20 0145 10/13/20 1404 10/13/20 1410 10/13/20 1639  WBC 11.8* 10.0  --   --  5.9  RBC 4.81 4.48  --   --  4.68  HGB 14.3 13.4 13.6 12.6  13.6 13.8  HCT 43.6 40.8 40.0 37.0  40.0 42.1  MCV 90.6 91.1  --   --  90.0  MCH 29.7 29.9  --   --  29.5  MCHC 32.8 32.8  --   --  32.8  RDW 13.4 13.7  --   --  13.6  PLT 235 214  --   --  249    BNP Recent Labs  Lab 10/11/20 1128  BNP 501.2*     DDimer  Recent Labs  Lab 10/11/20 1244  DDIMER 0.88*     Radiology    CARDIAC CATHETERIZATION  Result Date: 10/13/2020  Mid LAD-1 lesion is 40% stenosed.  Mid LAD-2 lesion is 80% stenosed.  A drug-eluting stent was successfully placed using a SYNERGY XD 3.0X28.  Post intervention, there is a 0% residual stenosis.  Post intervention, there is a 0% residual stenosis.  1. Severe mid LAD stenosis 2. Successful PTCA/DES x 1 mid LAD. Recommendations: Continue DAPT with ASA and Plavix for at least six months.   CARDIAC CATHETERIZATION  Result Date:  10/13/2020  Prox RCA lesion is 30% stenosed.  RPDA lesion is 95% stenosed.  RPAV lesion is 99% stenosed.  Prox LAD to Mid LAD lesion is 40% stenosed.  Mid LAD lesion is 80% stenosed.  2nd Diag lesion is 50% stenosed.  Findings: Ao = 147/97 (119) LV =  140/27 RA =  12 RV = 68/15 PA = 72/37 (51) PCW = 34 Fick cardiac output/index = 6.3/2.9 PVR = 2.6 WU FA sat = 97% PA sat = 72%, 72% Assessment: 1. 2v CAD with high grade lesions in the mid LAD and distal RCA 2. 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 Markedly elevated filling pressures with normal cardiac output Plan/Discussion: Plan PCI of LAD followed by aggressive diuresis and titration of GDMT. Glori Bickers, MD 2:57 PM    Patient Profile     45 year old female with past medical history of hypertension, sleep apnea, diabetes mellitus, tobacco abuse for evaluation of acute heart failure. Chest CT shows no pulmonary embolus.  The pulmonary artery is enlarged suggesting pulmonary hypertension.  Small pericardial effusion.  There is also note of pulmonary edema and pleural effusions.  Echocardiogram shows ejection fraction 20 to 25%, mild left ventricular hypertrophy, grade 2 diastolic dysfunction, mild RV dysfunction, moderate pulmonary hypertension, small pericardial effusion.  Cardiac catheterization January 14 showed 95% PDA, 99% RV branch, 80% mid LAD and 50% second diagonal.  Pulmonary capillary wedge pressure 34.  Patient had PCI of mid LAD with drug-eluting stent.  Assessment & Plan    1 acute CHF-symptoms improved.  I/O-3950. Wt 104.8 kg. PCWP 34 at time of catheterization.  Echo shows severe LV dysfunction.  Will continue Lasix 80 mg IV twice daily and transition to oral lasix in AM; follow renal function.  Continue spironolactone at present dose.  Continue digoxin.  Increase Entresto to 49/51 twice daily.  Continue coreg 3.125 mg BID; will advance later as CHF improves. Etiology of cardiomyopathy appears to be mixed ischemic and  nonischemic.  Now status post PCI of LAD.  There may be a contribution from hypertension.  No history of alcohol abuse.  We will need to fully titrate medications and once at goal repeat echocardiogram.  If ejection fraction less than 35% would need ICD.  2 coronary artery disease-no chest pain.  Status post PCI of LAD.  Continue aspirin, Plavix and statin.  3 hypertension-patient's blood pressure is elevated.  Increase Entresto to 49/51 twice daily.  Titrate medications as needed.  4 dilated pulmonary artery-consistent with pulmonary hypertension.  Likely secondary to obesity hypoventilation syndrome, obstructive sleep apnea and diastolic congestive heart failure.  5 tobacco abuse-patient previously counseled on discontinuing.  6 probable obstructive sleep apnea-we will need outpatient referral for management after discharge.  7 hyperlipidemia-continue Lipitor 80 mg daily.  Check lipids and liver in 12 weeks.  8 diabetes mellitus-Per primary care.  9 peripheral vascular disease-ABIs this admission moderate on the left.  Continue aspirin and statin.  Likely can be discharged tomorrow and follow-up in CHF clinic.  For questions or updates, please contact [place] Please consult [email or web address] for contact info under    Signed, Kirk Ruths, MD  10/15/2020, 7:30 AM

## 2020-10-16 ENCOUNTER — Encounter (HOSPITAL_COMMUNITY): Payer: Self-pay | Admitting: Internal Medicine

## 2020-10-16 ENCOUNTER — Other Ambulatory Visit (HOSPITAL_COMMUNITY): Payer: Self-pay | Admitting: Internal Medicine

## 2020-10-16 DIAGNOSIS — E11641 Type 2 diabetes mellitus with hypoglycemia with coma: Secondary | ICD-10-CM | POA: Diagnosis not present

## 2020-10-16 DIAGNOSIS — I25110 Atherosclerotic heart disease of native coronary artery with unstable angina pectoris: Secondary | ICD-10-CM | POA: Diagnosis not present

## 2020-10-16 DIAGNOSIS — I5021 Acute systolic (congestive) heart failure: Secondary | ICD-10-CM | POA: Diagnosis not present

## 2020-10-16 LAB — MAGNESIUM: Magnesium: 1.8 mg/dL (ref 1.7–2.4)

## 2020-10-16 LAB — BASIC METABOLIC PANEL
Anion gap: 11 (ref 5–15)
BUN: 14 mg/dL (ref 6–20)
CO2: 26 mmol/L (ref 22–32)
Calcium: 9.1 mg/dL (ref 8.9–10.3)
Chloride: 98 mmol/L (ref 98–111)
Creatinine, Ser: 0.8 mg/dL (ref 0.44–1.00)
GFR, Estimated: >60 mL/min (ref >60)
Glucose, Bld: 164 mg/dL — ABNORMAL HIGH (ref 70–99)
Potassium: 5.2 mmol/L — ABNORMAL HIGH (ref 3.5–5.1)
Sodium: 135 mmol/L (ref 135–145)

## 2020-10-16 LAB — GLUCOSE, CAPILLARY
Glucose-Capillary: 127 mg/dL — ABNORMAL HIGH (ref 70–99)
Glucose-Capillary: 239 mg/dL — ABNORMAL HIGH (ref 70–99)

## 2020-10-16 MED ORDER — SPIRONOLACTONE 25 MG PO TABS: 25.0000 mg | ORAL_TABLET | Freq: Every day | ORAL | 3 refills | Status: DC

## 2020-10-16 MED ORDER — ASPIRIN 81 MG PO CHEW: 81.0000 mg | CHEWABLE_TABLET | Freq: Every day | ORAL | 3 refills | Status: DC

## 2020-10-16 MED ORDER — FUROSEMIDE 40 MG PO TABS: 40.0000 mg | ORAL_TABLET | Freq: Every day | ORAL | 3 refills | Status: DC | PRN

## 2020-10-16 MED ORDER — DAPAGLIFLOZIN PROPANEDIOL 10 MG PO TABS
10.0000 mg | ORAL_TABLET | Freq: Every day | ORAL | Status: DC
  Administered 2020-10-16: 10 mg via ORAL
  Filled 2020-10-16: qty 1

## 2020-10-16 MED ORDER — ROSUVASTATIN CALCIUM 20 MG PO TABS
40.0000 mg | ORAL_TABLET | Freq: Every day | ORAL | Status: DC
  Administered 2020-10-16: 40 mg via ORAL
  Filled 2020-10-16: qty 2

## 2020-10-16 MED ORDER — DAPAGLIFLOZIN PROPANEDIOL 10 MG PO TABS: 10.0000 mg | ORAL_TABLET | Freq: Every day | ORAL | 3 refills | Status: DC

## 2020-10-16 MED ORDER — INSULIN DEGLUDEC 100 UNIT/ML ~~LOC~~ SOPN: 35.0000 [IU] | PEN_INJECTOR | Freq: Every day | SUBCUTANEOUS | 3 refills | Status: DC

## 2020-10-16 MED ORDER — SACUBITRIL-VALSARTAN 49-51 MG PO TABS: 1.0000 | ORAL_TABLET | Freq: Two times a day (BID) | ORAL | 3 refills | Status: DC

## 2020-10-16 MED ORDER — ROSUVASTATIN CALCIUM 40 MG PO TABS: 40.0000 mg | ORAL_TABLET | Freq: Every day | ORAL | 3 refills | Status: DC

## 2020-10-16 MED ORDER — CLOPIDOGREL BISULFATE 75 MG PO TABS: 75.0000 mg | ORAL_TABLET | Freq: Every day | ORAL | 3 refills | Status: DC

## 2020-10-16 MED ORDER — CARVEDILOL 3.125 MG PO TABS: 3.1250 mg | ORAL_TABLET | Freq: Two times a day (BID) | ORAL | 2 refills | Status: DC

## 2020-10-16 MED FILL — CLOPIDOGREL 75 MG TABLET: 75 | 30 days supply | Qty: 30 | Fill #0 | Status: TO

## 2020-10-16 MED FILL — FUROSEMIDE 40 MG TABLET: 40 | 30 days supply | Qty: 30 | Fill #0 | Status: TO

## 2020-10-16 MED FILL — ENTRESTO 49 MG-51 MG TABLET: 49-51 | 30 days supply | Qty: 60 | Fill #0 | Status: TO

## 2020-10-16 MED FILL — ASPIRIN LOW DOSE 81 MG CHEW: 81 | 30 days supply | Qty: 30 | Fill #0 | Status: TO

## 2020-10-16 MED FILL — PENTIPS 32G X 4 MM MISC: 32G X 4 MM | 30 days supply | Qty: 100 | Fill #0

## 2020-10-16 MED FILL — SPIRONOLACTONE 25 MG TABLET: 25 | 30 days supply | Qty: 30 | Fill #0 | Status: TO

## 2020-10-16 MED FILL — ROSUVASTATIN CALCIUM 40 MG: 40 | 30 days supply | Qty: 30 | Fill #0 | Status: TO

## 2020-10-16 MED FILL — FARXIGA 10 MG TABLET: 10 | 30 days supply | Qty: 30 | Fill #0 | Status: TO

## 2020-10-16 MED FILL — TRESIBA FLEXTOUCH 100 UNITS: 100 | 30 days supply | Qty: 12 | Fill #0 | Status: TO

## 2020-10-16 MED FILL — CARVEDILOL 3.125 MG TABLET: 3.125 | 30 days supply | Qty: 60 | Fill #0 | Status: TO

## 2020-10-16 NOTE — Progress Notes (Addendum)
Advanced Heart Failure Rounding Note  PCP-Cardiologist: No primary care provider on file.   Subjective:   RHC/LHC  1/14  Prox RCA lesion is 30% stenosed.  RPDA lesion is 95% stenosed.  RPAV lesion is 99% stenosed.  Prox LAD to Mid LAD lesion is 40% stenosed.  Mid LAD lesion is 80% stenosed.  2nd Diag lesion is 50% stenosed.  Findings: Ao = 147/97 (119) LV =  140/27 RA =  12 RV = 68/15 PA = 72/37 (51) PCW = 34 Fick cardiac output/index = 6.3/2.9 PVR = 2.6 WU FA sat = 97% PA sat = 72%, 72%  Yesterday entresto increased to 49-51 mg twice a day and given 40 meq potassium. K 5.2 today.   Diuresing with IV lasix. Brisk diuresis noted. Overall weight down 20 pounds.   Feels good. No chest pain. Wants to go home.    Objective:   Weight Range: 100 kg Body mass index is 36.69 kg/m.   Vital Signs:   Temp:  [97.7 F (36.5 C)-98.6 F (37 C)] 97.7 F (36.5 C) (01/17 0357) Pulse Rate:  [90-92] 92 (01/16 1518) Resp:  [16-17] 16 (01/17 0357) BP: (126-138)/(87-88) 138/87 (01/17 0357) SpO2:  [97 %-99 %] 99 % (01/17 0357) Weight:  [100 kg] 100 kg (01/17 0357) Last BM Date: 10/15/20  Weight change: Filed Weights   10/14/20 0533 10/15/20 0500 10/16/20 0357  Weight: 108.2 kg 104.8 kg 100 kg    Intake/Output:   Intake/Output Summary (Last 24 hours) at 10/16/2020 0904 Last data filed at 10/16/2020 0354 Gross per 24 hour  Intake 1133 ml  Output 5750 ml  Net -4617 ml      Physical Exam    General:  No resp difficulty HEENT: Normal Neck: Supple. JVP 5-6 Carotids 2+ bilat; no bruits. No lymphadenopathy or thyromegaly appreciated. Cor: PMI nondisplaced. Regular rate & rhythm. No rubs, gallops or murmurs. Lungs: Clear Abdomen: Soft, nontender, nondistended. No hepatosplenomegaly. No bruits or masses. Good bowel sounds. Extremities: No cyanosis, clubbing, rash, edema Neuro: Alert & orientedx3, cranial nerves grossly intact. moves all 4 extremities w/o difficulty.  Affect pleasant   Telemetry  SR 80-90s   EKG    N/A  Labs    CBC Recent Labs    10/13/20 1410 10/13/20 1639  WBC  --  5.9  HGB 12.6  13.6 13.8  HCT 37.0  40.0 42.1  MCV  --  90.0  PLT  --  0000000   Basic Metabolic Panel Recent Labs    10/15/20 0237 10/16/20 0224  NA 135 135  K 4.4 5.2*  CL 98 98  CO2 27 26  GLUCOSE 210* 164*  BUN 13 14  CREATININE 0.76 0.80  CALCIUM 9.0 9.1  MG 1.6* 1.8   Liver Function Tests No results for input(s): AST, ALT, ALKPHOS, BILITOT, PROT, ALBUMIN in the last 72 hours. No results for input(s): LIPASE, AMYLASE in the last 72 hours. Cardiac Enzymes No results for input(s): CKTOTAL, CKMB, CKMBINDEX, TROPONINI in the last 72 hours.  BNP: BNP (last 3 results) Recent Labs    10/11/20 1128  BNP 501.2*    ProBNP (last 3 results) No results for input(s): PROBNP in the last 8760 hours.   D-Dimer No results for input(s): DDIMER in the last 72 hours. Hemoglobin A1C No results for input(s): HGBA1C in the last 72 hours. Fasting Lipid Panel No results for input(s): CHOL, HDL, LDLCALC, TRIG, CHOLHDL, LDLDIRECT in the last 72 hours. Thyroid Function Tests No results  for input(s): TSH, T4TOTAL, T3FREE, THYROIDAB in the last 72 hours.  Invalid input(s): FREET3  Other results:   Imaging     No results found.   Medications:     Scheduled Medications: . aspirin  81 mg Oral Daily  . carvedilol  3.125 mg Oral BID WC  . clopidogrel  75 mg Oral Daily  . digoxin  0.125 mg Oral Daily  . doxycycline  100 mg Oral Q12H  . enoxaparin (LOVENOX) injection  40 mg Subcutaneous Q24H  . furosemide  80 mg Intravenous BID  . insulin aspart  0-9 Units Subcutaneous TID WC  . insulin aspart  6 Units Subcutaneous TID WC  . insulin glargine  35 Units Subcutaneous Daily  . nicotine  21 mg Transdermal Daily  . rosuvastatin  40 mg Oral Daily  . sacubitril-valsartan  1 tablet Oral BID  . sodium chloride flush  3 mL Intravenous Q12H  . sodium  chloride flush  3 mL Intravenous Q12H  . spironolactone  25 mg Oral Daily     Infusions: . sodium chloride       PRN Medications:  sodium chloride, acetaminophen, HYDROcodone-acetaminophen, ondansetron (ZOFRAN) IV, sodium chloride flush    Patient Profile    Catherine Espinoza is a 45 y.o. F PMH: T2DM, HTN recently off medication due to financial reasons with BMI 40 presented to ED on 10/11/20 for leg swelling and pain. Admitted for new onset of HFrEF.   Assessment/Plan   1.  Acute on HFrEF -Echo this admission shows EF 20-25% with global hypokinesis, mild LVH, grade I DD, RVSP 54, small pericardial effusion and IVC dilated w/ RAP 15. HS trop 18 -> 18, BNP 501, CT showing  Small pericardial effusion, reflux in hepatic veins with moderate opacities in both lungs, dilated main pulmonary artery.   - NICM/ICM. Had PCI to LAD + HTN - Volume status stable. Stop IV lasix. Add lasix 40 mg as needed for 3 pound weight gain.   - Continue coreg 3.125 mg twice a day.  - Continue entresto 49-51 mg twice a day  - Continue spiro 25 mg daily. - Add farxiga 10 mg daily. Plan to repeat ECHO in 3 months after HF meds optimized.    2.  Uncontrolled T2DM -A1c 13.5% (previously 9.6%) - Per [facility].  - Add farxiga for HF/DM  3. Hypertension -Improved.   4.  Obesity Body mass index is 36.69 kg/m.  5.  Hypokalemia Resolved. High today.   6.  Hidradenitis -continue with doxycycline  7.  Tobacco use -nicotine patch  8. Hyperkalemia  Stop K supp.   9. CAD S/P PCI LAD On Aspirin + crestor + plavix.   HF follow up next week. 10/26/19 at 1000. We will check BMET CBC at that time.   HF meds for d/c  -Lasix 40 mg as needed for 3 pound weight gain - Coreg 3.125 mg twice a day  - Entresto 49-51 mg twice a day  - Spironolactone 25 mg daily  - Farxiga 10 mg daily - Crestor 40 mg daily  - Aspirin 81 mg daily  - Plavix 75 mg daily  Length of Stay: 5  Amy Clegg, NP  10/16/2020, 9:04  AM  Advanced Heart Failure Team Pager [phone] (M-F; 7a - 4p)  Please contact [facility] for night-coverage after hours (4p -7a ) and weekends on [email or web address]  Much improved after IV diuresis. GDMT had been titrated.    General:  Well appearing. No resp difficulty HEENT: normal  Neck: supple. no JVD. Carotids 2+ bilat; no bruits. No lymphadenopathy or thryomegaly appreciated. Cor: PMI nondisplaced. Regular rate & rhythm. No rubs, gallops or murmurs. Lungs: clear Abdomen: soft, nontender, nondistended. No hepatosplenomegaly. No bruits or masses. Good bowel sounds. Extremities: no cyanosis, clubbing, rash, edema Neuro: alert & orientedx3, cranial nerves grossly intact. moves all 4 extremities w/o difficulty. Affect pleasant  Agree with d/c today. Meds as above. F/u in HF Clinic.   Glori Bickers, MD  11:54 AM

## 2020-10-16 NOTE — Discharge Summary (Signed)
Physician Discharge Summary   Patient ID: Catherine Espinoza MRN: [id number] DOB/AGE: March 29, 1976 45 y.o.  Admit date: 10/11/2020 Discharge date: 10/16/2020  Primary Care Physician:  Catherine Alar, NP   Recommendations for Outpatient Follow-up:  1. Follow up with PCP in 1-2 weeks 2. Follow-up with cardiology as scheduled  Home Health: None Equipment/Devices:   Discharge Condition: stable  CODE STATUS: FULL Diet recommendation: Carb modified diet   Discharge Diagnoses:   Acute systolic CHF, EF less than 20% Severe ischemic and NICM Diabetes mellitus type 2, IDDM, uncontrolled with hyperglycemia Hypertension Hypokalemia Hypomagnesemia Tobacco use Hidradenitis Morbid obesity  Consults: Cardiology    Allergies:   Allergies  Allergen Reactions  . Ibuprofen     REACTION: knots in mouth with SOB  . Labetalol Nausea Only  . Aspirin     Knots in mouth      DISCHARGE MEDICATIONS: Allergies as of 10/16/2020      Reactions   Ibuprofen    REACTION: knots in mouth with SOB   Labetalol Nausea Only   Aspirin    Knots in mouth      Medication List    STOP taking these medications   atorvastatin 40 MG tablet Commonly known as: LIPITOR   Basaglar KwikPen 100 UNIT/ML   Insulin Glargine-yfgn 100 UNIT/ML Sopn   lisinopril 20 MG tablet Commonly known as: ZESTRIL     TAKE these medications   aspirin 81 MG chewable tablet Chew 1 tablet (81 mg total) by mouth daily. Start taking on: October 17, 2020   Bayer Microlet Lancets lancets Use as instructed to check blood sugar three times daily.  DX  E11.65   blood glucose meter kit and supplies Kit Dispense based on patient and insurance preference. Use up to four times daily as directed. (FOR ICD-9 250.00, 250.01).   carvedilol 3.125 MG tablet Commonly known as: COREG Take 1 tablet (3.125 mg total) by mouth 2 (two) times daily with a meal.   clopidogrel 75 MG tablet Commonly known as: PLAVIX Take 1 tablet  (75 mg total) by mouth daily. Start taking on: October 17, 2020   dapagliflozin propanediol 10 MG Tabs tablet Commonly known as: FARXIGA Take 1 tablet (10 mg total) by mouth daily. Start taking on: October 17, 2020   doxycycline 100 MG tablet Commonly known as: VIBRA-TABS Take 1 tablet (100 mg total) by mouth 2 (two) times daily.   FreeStyle Bradford 2 [employer] as directed   [employer] Apply every 14 days.   FreeStyle Libre 2 Sensor Misc Use as directed   furosemide 40 MG tablet Commonly known as: LASIX Take 1 tablet (40 mg total) by mouth daily as needed for fluid or edema. For every 3 lb weight gain What changed:   medication strength  how much to take  reasons to take this  additional instructions   glucose blood test strip Commonly known as: Bayer Contour Test Use to check blood sugar 3 times per day.  DX  E11.65   insulin degludec 100 UNIT/ML FlexTouch Pen Commonly known as: TRESIBA Inject 35 Units into the skin daily.   Insulin Syringe 27G X 1/2" 0.5 ML Misc Use as directed   rosuvastatin 40 MG tablet Commonly known as: CRESTOR Take 1 tablet (40 mg total) by mouth daily. Start taking on: October 17, 2020   sacubitril-valsartan 49-51 MG Commonly known as: ENTRESTO Take 1 tablet by mouth 2 (two) times daily.   spironolactone 25 MG tablet Commonly known  as: ALDACTONE Take 1 tablet (25 mg total) by mouth daily. Start taking on: October 17, 2020   valACYclovir 1000 MG tablet Commonly known as: VALTREX Take 1 tablet by mouth once daily for 5 days at start of rash        Brief H and P: For complete details please refer to admission H and P, but in brief,*Mindi Leggettis a 45 y.o.femalewithhistory of diabetes mellitus type 2, morbid obesity, hypertension who has not been compliant with her medications due to patient having financial issues has been experiencing increasing swelling in the lower extremities more on the  right side with pain for which patient's primary care physician had placed her on doxycycline despite taking which the swelling and pain has not improved. And patient presents to the ER at [facility]. Denies fever chills chest pain shortness of breath nausea vomiting or diarrhea. Patient states she may have lost weight over the last 1 year. Acute systolic heart failure and pulmonary hyperteinsion: 2D echo with EF of 20-25%. HF Cardiology team following. Remains on IV Lasix for diuresis.  Underwent Heart cath with PTCA/DES x1 of mid LAD. Will need to continue DAPT ASA and Plavix for at least 6 months.  [facility] Course:   Acute systolic CHF, EF <70%, small pericardial effusion, CAD - With EF of <20% with severe ischemic and NICM per cardiac cath on 1/14.  -2D echo showed EF of 20 to 25% with global hypokinesis, mild LVH, grade 1 DD -CT showed small pericardial effusion, reflux and hepatic veins with moderate opacities in both lungs, dilated main pulmonary artery.   -Underwent cardiac cath on 1/14 with PCI to LAD  -Cardiology/CHF team closely following, recommended adequately diuresed, stop IV Lasix -Continue Lasix 40 mg as needed for 3 pound weight gain, Coreg 3.125 mg twice daily, Entresto 49-51 twice daily, spironolactone 25 mg daily, Farxiga 10 mg daily -Plan for repeating echo in 3 months after heart failure meds have been optimized   Diabetes mellitus type 2, uncontrolled with hyperglycemia Hgb A1c of 13.5, uncontrolled -Closely followed by diabetic coordinator, received education, also strongly recommended compliance -Added Farxiga 10 mg daily, continue Tresiba 35 units daily  HTN BP stable, continue to monitor with newly started meds   Hypokalemia, hypomagnesemia Resolved, 5.2 at the time of discharge   Tobacco use Continue nicotine patch.   Hidradenitis  Continue doxycycline, has axillary abscess.  This is improving denies significant pain draining in  the right axilla  Morbid obesity BMI 40.6 Strongly recommended diet and lifestyle changes  Day of Discharge S: Feels better, looking forward to discharge home today  BP (!) 135/97   Pulse 89   Temp 97.7 F (36.5 C) (Oral)   Resp 16   Ht _0  (1.651 m)   Wt 100 kg   LMP 09/13/2011   SpO2 99%   BMI 36.69 kg/m   Physical Exam: General: Alert and awake oriented x3 not in any acute distress. HEENT: anicteric sclera, pupils reactive to light and accommodation CVS: S1-S2 clear no murmur rubs or gallops Chest: clear to auscultation bilaterally, no wheezing rales or rhonchi Abdomen: soft nontender, nondistended, normal bowel sounds Extremities: no cyanosis, clubbing or edema noted bilaterally Neuro: Cranial nerves II-XII intact, no focal neurological deficits    Get Medicines reviewed and adjusted: Please take all your medications with you for your next visit with your Primary MD  Please request your Primary MD to go over all hospital tests and procedure/radiological results  at the follow up. Please ask your Primary MD to get all Hospital records sent to his/her office.  If you experience worsening of your admission symptoms, develop shortness of breath, life threatening emergency, suicidal or homicidal thoughts you must seek medical attention immediately by calling 911 or calling your MD immediately  if symptoms less severe.  You must read complete instructions/literature along with all the possible adverse reactions/side effects for all the Medicines you take and that have been prescribed to you. Take any new Medicines after you have completely understood and accept all the possible adverse reactions/side effects.   Do not drive when taking pain medications.   Do not take more than prescribed Pain, Sleep and Anxiety Medications  Special Instructions: If you have smoked or chewed Tobacco  in the last 2 yrs please stop smoking, stop any regular Alcohol  and or any Recreational  drug use.  Wear Seat belts while driving.  Please note  You were cared for by a hospitalist during your hospital stay. Once you are discharged, your primary care physician will handle any further medical issues. Please note that NO REFILLS for any discharge medications will be authorized once you are discharged, as it is imperative that you return to your primary care physician (or establish a relationship with a primary care physician if you do not have one) for your aftercare needs so that they can reassess your need for medications and monitor your lab values.   The results of significant diagnostics from this hospitalization (including imaging, microbiology, ancillary and laboratory) are listed below for reference.      Procedures/Studies:  CT Angio Chest PE W/Cm &/Or Wo Cm  Result Date: 10/11/2020 CLINICAL DATA:  Elevated D-dimer. CHF of new onset with dyspnea. Bilateral lower extremity swelling and pain. EXAM: CT ANGIOGRAPHY CHEST WITH CONTRAST TECHNIQUE: Multidetector CT imaging of the chest was performed using the standard protocol during bolus administration of intravenous contrast. Multiplanar CT image reconstructions and MIPs were obtained to evaluate the vascular anatomy. CONTRAST:  136m OMNIPAQUE IOHEXOL 350 MG/ML SOLN COMPARISON:  05/28/2019 chest CT. FINDINGS: Cardiovascular: The study is high quality for the evaluation of pulmonary embolism. There are no filling defects in the central, lobar, segmental or subsegmental pulmonary artery branches to suggest acute pulmonary embolism. Atherosclerotic nonaneurysmal thoracic aorta. Dilated main pulmonary artery (3.6 cm diameter). Borderline mild cardiomegaly. Small pericardial effusion/thickening, slightly increased. Left anterior descending coronary atherosclerosis. Mediastinum/Nodes: No discrete thyroid nodules. Unremarkable esophagus. Mild right axillary adenopathy up to 1.1 cm short axis diameter (series 7/image 39), not appreciably  changed. Stable mild left supraclavicular adenopathy up to 1.1 cm (series 7/image 8). Mild right paratracheal adenopathy up to 1.6 cm (series 7/image 23), stable. No pathologically enlarged hilar nodes. Lungs/Pleura: No pneumothorax. Small dependent bilateral pleural effusions, right greater than left. Moderate patchy ground-glass opacities throughout both lungs with worsened interlobular septal thickening throughout both lungs. No lung masses or significant pulmonary nodules. Upper abdomen: Mild contrast reflux into the IVC and hepatic veins. Musculoskeletal: No aggressive appearing focal osseous lesions. Mild thoracic spondylosis. Mild anasarca. Review of the MIP images confirms the above findings. IMPRESSION: 1. No pulmonary embolism. 2. Borderline mild cardiomegaly. Small pericardial effusion/thickening, slightly increased. Mild contrast reflux into the IVC and hepatic veins, which may indicate right heart failure. 3. Moderate patchy ground-glass opacities throughout both lungs with worsened interlobular septal thickening throughout both lungs, most compatible with cardiogenic pulmonary edema. 4. Small dependent bilateral pleural effusions, right greater than left. 5. Dilated main pulmonary  artery, suggesting pulmonary arterial hypertension. 6. Chronic mild right axillary, left supraclavicular and mediastinal lymphadenopathy, stable, presumably reactive. 7. Aortic Atherosclerosis (ICD10-I70.0). Electronically Signed   By: Ilona Sorrel M.D.   On: 10/11/2020 14:35   CARDIAC CATHETERIZATION  Result Date: 10/13/2020  Mid LAD-1 lesion is 40% stenosed.  Mid LAD-2 lesion is 80% stenosed.  A drug-eluting stent was successfully placed using a SYNERGY XD 3.0X28.  Post intervention, there is a 0% residual stenosis.  Post intervention, there is a 0% residual stenosis.  1. Severe mid LAD stenosis 2. Successful PTCA/DES x 1 mid LAD. Recommendations: Continue DAPT with ASA and Plavix for at least six months.    CARDIAC CATHETERIZATION  Result Date: 10/13/2020  Prox RCA lesion is 30% stenosed.  RPDA lesion is 95% stenosed.  RPAV lesion is 99% stenosed.  Prox LAD to Mid LAD lesion is 40% stenosed.  Mid LAD lesion is 80% stenosed.  2nd Diag lesion is 50% stenosed.  Findings: Ao = 147/97 (119) LV =  140/27 RA =  12 RV = 68/15 PA = 72/37 (51) PCW = 34 Fick cardiac output/index = 6.3/2.9 PVR = 2.6 WU FA sat = 97% PA sat = 72%, 72% Assessment: 1. 2v CAD with high grade lesions in the mid LAD and distal RCA 2. 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 Markedly elevated filling pressures with normal cardiac output Plan/Discussion: Plan PCI of LAD followed by aggressive diuresis and titration of GDMT. Glori Bickers, MD 2:57 PM   US Venous Img Lower Unilateral Right  Result Date: 10/04/2020 CLINICAL DATA:  Pain, edema.  History of tobacco abuse EXAM: RIGHT LOWER EXTREMITY VENOUS DOPPLER ULTRASOUND TECHNIQUE: Gray-scale sonography with compression, as well as color and duplex ultrasound, were performed to evaluate the deep venous system(s) from the level of the common femoral vein through the popliteal and proximal calf veins. COMPARISON:  None. FINDINGS: VENOUS Normal compressibility of the common femoral, superficial femoral, and popliteal veins, as well as the visualized calf veins. Visualized portions of profunda femoral vein and great saphenous vein unremarkable. No filling defects to suggest DVT on grayscale or color Doppler imaging. Doppler waveforms show normal direction of venous flow, normal respiratory phasicity and response to augmentation. Limited views of the contralateral common femoral vein are unremarkable. OTHER: None Limitations: Technologist describes technically difficult study secondary to body habitus and swelling. IMPRESSION: No femoropopliteal DVT nor evidence of DVT within the visualized calf veins. If clinical symptoms are inconsistent or if there are persistent or worsening  symptoms, further imaging (possibly involving the iliac veins) may be warranted. Electronically Signed   By: Lucrezia Europe M.D.   On: 10/04/2020 16:23   DG Chest Port 1 View  Result Date: 10/11/2020 CLINICAL DATA:  Bilateral lower extremity pain and swelling. EXAM: PORTABLE CHEST 1 VIEW COMPARISON:  PA and lateral chest 05/09/2018. FINDINGS: The cardiopericardial silhouette is enlarged. There is diffuse hazy bilateral airspace disease. No pneumothorax or pleural effusion. IMPRESSION: Enlarged cardiopericardial silhouette compatible with cardiomegaly and/or pericardial effusion. Diffuse hazy airspace opacity likely due to pulmonary edema. Electronically Signed   By: Inge Rise M.D.   On: 10/11/2020 11:24   VAS [place] ABI WITH/WO TBI  Result Date: 10/13/2020 LOWER EXTREMITY DOPPLER STUDY Indications: Rest pain. High Risk Factors: Hypertension, hyperlipidemia, Diabetes.  Comparison Study: No prior studies. Performing Technologist: Carlos Levering RVT  Examination Guidelines: A complete evaluation includes at minimum, Doppler waveform signals and systolic blood pressure reading at the level of bilateral brachial, anterior tibial,  and posterior tibial arteries, when vessel segments are accessible. Bilateral testing is considered an integral part of a complete examination. Photoelectric Plethysmograph (PPG) waveforms and toe systolic pressure readings are included as required and additional duplex testing as needed. Limited examinations for reoccurring indications may be performed as noted.  ABI Findings: +---------+------------------+-----+---------+--------+ Right    Rt Pressure (mmHg)IndexWaveform Comment  +---------+------------------+-----+---------+--------+ Brachial 148                    triphasic         +---------+------------------+-----+---------+--------+ PTA      149               1.01 triphasic         +---------+------------------+-----+---------+--------+ DP       146                0.99 triphasic         +---------+------------------+-----+---------+--------+ Great Toe140               0.95                   +---------+------------------+-----+---------+--------+ +---------+------------------+-----+---------+-------+ Left     Lt Pressure (mmHg)IndexWaveform Comment +---------+------------------+-----+---------+-------+ Brachial 145                    triphasic        +---------+------------------+-----+---------+-------+ PTA      108               0.73 biphasic         +---------+------------------+-----+---------+-------+ DP       103               0.70 biphasic         +---------+------------------+-----+---------+-------+ Great Toe91                0.61                  +---------+------------------+-----+---------+-------+ +-------+-----------+-----------+------------+------------+ ABI/TBIToday's ABIToday's TBIPrevious ABIPrevious TBI +-------+-----------+-----------+------------+------------+ Right  1.01       0.95                                +-------+-----------+-----------+------------+------------+ Left   0.73       0.61                                +-------+-----------+-----------+------------+------------+  Summary: Right: Resting right ankle-brachial index is within normal range. No evidence of significant right lower extremity arterial disease. The right toe-brachial index is normal. Left: Resting left ankle-brachial index indicates moderate left lower extremity arterial disease. The left toe-brachial index is abnormal.  *See table(s) above for measurements and observations.  Electronically signed by Jamelle Haring on 10/13/2020 at 2:56:26 PM.    Final    ECHOCARDIOGRAM COMPLETE  Result Date: 10/12/2020    ECHOCARDIOGRAM REPORT   Patient Name:   VANTASIA PINKNEY Date of Exam: 10/12/2020 Medical Rec #:  [id number]      Height:       65.0 in Accession #:    [id number]     Weight:       247.0 lb Date of Birth:  08-08-1976       BSA:          2.164 m Patient Age:    17 years       BP:  133/96 mmHg Patient Gender: F              HR:           98 bpm. Exam Location:  Inpatient Procedure: 2D Echo, Cardiac Doppler, Color Doppler and Intracardiac            Opacification Agent Indications:    CHF  History:        Patient has prior history of Echocardiogram examinations. Risk                 Factors:Hypertension, Sleep Apnea, Diabetes and Current Smoker.  Sonographer:    Clayton Lefort RDCS (AE) Referring Phys: [place]  1. Left ventricular ejection fraction, by estimation, is 20 to 25%. The left ventricle has severely decreased function. The left ventricle demonstrates global hypokinesis. There is mild left ventricular hypertrophy. Left ventricular diastolic parameters  are consistent with Grade II diastolic dysfunction (pseudonormalization). Elevated left atrial pressure.  2. Right ventricular systolic function is mildly reduced. The right ventricular size is normal. There is moderately elevated pulmonary artery systolic pressure. The estimated right ventricular systolic pressure is 99.3 mmHg.  3. A small pericardial effusion is present. The pericardial effusion is circumferential.  4. The mitral valve is normal in structure. Trivial mitral valve regurgitation.  5. The aortic valve was not well visualized. Aortic valve regurgitation is trivial. No aortic stenosis is present.  6. The inferior vena cava is dilated in size with <50% respiratory variability, suggesting right atrial pressure of 15 mmHg. FINDINGS  Left Ventricle: Left ventricular ejection fraction, by estimation, is 20 to 25%. The left ventricle has severely decreased function. The left ventricle demonstrates global hypokinesis. Definity contrast agent was given IV to delineate the left ventricular endocardial borders. The left ventricular internal cavity size was normal in size. There is mild left ventricular hypertrophy. Left ventricular  diastolic parameters are consistent with Grade II diastolic dysfunction (pseudonormalization). Elevated left atrial pressure. Right Ventricle: The right ventricular size is normal. Right vetricular wall thickness was not well visualized. Right ventricular systolic function is mildly reduced. There is moderately elevated pulmonary artery systolic pressure. The tricuspid regurgitant velocity is 3.15 m/s, and with an assumed right atrial pressure of 15 mmHg, the estimated right ventricular systolic pressure is 71.6 mmHg. Left Atrium: Left atrial size was normal in size. Right Atrium: Right atrial size was normal in size. Pericardium: A small pericardial effusion is present. The pericardial effusion is circumferential. Mitral Valve: The mitral valve is normal in structure. Trivial mitral valve regurgitation. MV peak gradient, 7.2 mmHg. The mean mitral valve gradient is 3.0 mmHg. Tricuspid Valve: The tricuspid valve is normal in structure. Tricuspid valve regurgitation is trivial. Aortic Valve: The aortic valve was not well visualized. Aortic valve regurgitation is trivial. No aortic stenosis is present. Aortic valve mean gradient measures 4.0 mmHg. Aortic valve peak gradient measures 8.5 mmHg. Aortic valve area, by VTI measures 1.59 cm. Pulmonic Valve: The pulmonic valve was not well visualized. Pulmonic valve regurgitation is not visualized. Aorta: The aortic root and ascending aorta are structurally normal, with no evidence of dilitation. Venous: The inferior vena cava is dilated in size with less than 50% respiratory variability, suggesting right atrial pressure of 15 mmHg. IAS/Shunts: No atrial level shunt detected by color flow Doppler.  LEFT VENTRICLE PLAX 2D LVIDd:         4.90 cm  Diastology LVIDs:         4.40 cm  LV e' medial:  6.30 cm/s LV PW:         1.30 cm  LV E/e' medial:  17.9 LV IVS:        1.10 cm  LV e' lateral:   8.10 cm/s LVOT diam:     2.00 cm  LV E/e' lateral: 14.0 LV SV:         34 LV SV  Index:   16 LVOT Area:     3.14 cm  RIGHT VENTRICLE            IVC RV Basal diam:  3.30 cm    IVC diam: 2.10 cm RV S prime:     7.35 cm/s TAPSE (M-mode): 1.6 cm LEFT ATRIUM             Index       RIGHT ATRIUM           Index LA diam:        3.70 cm 1.71 cm/m  RA Area:     18.60 cm LA Vol (A2C):   56.1 ml 25.93 ml/m RA Volume:   49.00 ml  22.65 ml/m LA Vol (A4C):   75.3 ml 34.80 ml/m LA Biplane Vol: 69.5 ml 32.12 ml/m  AORTIC VALVE AV Area (Vmax):    1.53 cm AV Area (Vmean):   1.54 cm AV Area (VTI):     1.59 cm AV Vmax:           146.00 cm/s AV Vmean:          90.200 cm/s AV VTI:            0.216 m AV Peak Grad:      8.5 mmHg AV Mean Grad:      4.0 mmHg LVOT Vmax:         71.30 cm/s LVOT Vmean:        44.200 cm/s LVOT VTI:          0.109 m LVOT/AV VTI ratio: 0.50  AORTA Ao Root diam: 3.00 cm Ao Asc diam:  3.40 cm MITRAL VALVE                TRICUSPID VALVE MV Area (PHT): 4.41 cm     TR Peak grad:   39.7 mmHg MV Area VTI:   1.24 cm     TR Vmax:        315.00 cm/s MV Peak grad:  7.2 mmHg MV Mean grad:  3.0 mmHg     SHUNTS MV Vmax:       1.34 m/s     Systemic VTI:  0.11 m MV Vmean:      71.3 cm/s    Systemic Diam: 2.00 cm MV Decel Time: 172 msec MV E velocity: 113.00 cm/s MV A velocity: 64.00 cm/s MV E/A ratio:  1.77 Oswaldo Milian MD Electronically signed by Oswaldo Milian MD Signature Date/Time: 10/12/2020/6:17:52 PM    Final        LAB RESULTS: Basic Metabolic Panel: Recent Labs  Lab 10/15/20 0237 10/16/20 0224  NA 135 135  K 4.4 5.2*  CL 98 98  CO2 27 26  GLUCOSE 210* 164*  BUN 13 14  CREATININE 0.76 0.80  CALCIUM 9.0 9.1  MG 1.6* 1.8   Liver Function Tests: Recent Labs  Lab 10/12/20 0145  AST 11*  ALT 20  ALKPHOS 84  BILITOT 0.5  PROT 5.9*  ALBUMIN 2.0*   No results for input(s): LIPASE, AMYLASE in the last 168 hours. No results for input(s): AMMONIA  in the last 168 hours. CBC: Recent Labs  Lab 10/12/20 0145 10/13/20 1404 10/13/20 1410 10/13/20 1639   WBC 10.0  --   --  5.9  NEUTROABS 5.8  --   --   --   HGB 13.4   < > 12.6  13.6 13.8  HCT 40.8   < > 37.0  40.0 42.1  MCV 91.1  --   --  90.0  PLT 214  --   --  249   < > = values in this interval not displayed.   Cardiac Enzymes: No results for input(s): CKTOTAL, CKMB, CKMBINDEX, TROPONINI in the last 168 hours. BNP: Invalid input(s): POCBNP CBG: Recent Labs  Lab 10/15/20 2132 10/16/20 0723  GLUCAP 168* 127*       Disposition and Follow-up: Discharge Instructions    (HEART FAILURE PATIENTS) Call MD:  Anytime you have any of the following symptoms: 1) 3 pound weight gain in 24 hours or 5 pounds in 1 week 2) shortness of breath, with or without a dry hacking cough 3) swelling in the hands, feet or stomach 4) if you have to sleep on extra pillows at night in order to breathe.   Complete by: As directed    Diet - low sodium heart healthy   Complete by: As directed    Diet Carb Modified   Complete by: As directed    Increase activity slowly   Complete by: As directed        DISPOSITION: [place], Jayadeep S, MD.   Specialties: Cardiology, Radiology, Interventional Cardiology Contact information: [address] [place] [address] [phone]        Catherine Alar, NP Follow up on 10/19/2020.   Specialty: Internal Medicine Why: @ 3:45 pm for hospital follow up appointment.  Contact information: [address] [place] [address] [phone]        [facility] Follow up on 10/25/2020.   Specialty: Cardiology Why: at [place]. Bring all medications Contact information: [address] [id number] [place] [phone]               Time coordinating discharge:  35 minutes  Signed:   Estill Cotta M.D. [facility] 10/16/2020, 10:59 AM

## 2020-10-16 NOTE — Care Management (Signed)
1143 10-16-20 Medications will be delivered via [facility]. Patient has her flex spending card to pay for medications. No further needs from Case Manager at this time. Bethena Roys, RN,BSN Case Manager

## 2020-10-17 ENCOUNTER — Telehealth: Payer: Self-pay

## 2020-10-17 NOTE — Telephone Encounter (Signed)
Transition Care Management Follow-up Telephone Call  Date of discharge and from where: 10/16/20-[facility]  How have you been since you were released from the hospital? Doing OK  Any questions or concerns? No  Items Reviewed:  Did the pt receive and understand the discharge instructions provided? Yes   Medications obtained and verified? Yes   Other? Yes   Any new allergies since your discharge? No   Dietary orders reviewed? Yes  Do you have support at home? Yes   Home Care and Equipment/Supplies: Were home health services ordered? no If so, what is the name of the agency? n/a  Has the agency set up a time to come to the patient's home? not applicable Were any new equipment or medical supplies ordered?  No What is the name of the medical supply agency? n/a Were you able to get the supplies/equipment? not applicable Do you have any questions related to the use of the equipment or supplies? n/a  Functional Questionnaire: (I = Independent and D = Dependent) ADLs: I  Bathing/Dressing- I  Meal Prep- I  Eating- I  Maintaining continence- I  Transferring/Ambulation- I  Managing Meds- I  Follow up appointments reviewed:   PCP Hospital f/u appt confirmed? Yes  Scheduled to see [name] on 10/19/20 @ 4:00.  [facility] f/u appt confirmed? Yes  Scheduled to see Cardiology on 10/25/20 @ 10:00.  Are transportation arrangements needed? No   If their condition worsens, is the pt aware to call PCP or go to the Emergency Dept.? Yes  Was the patient provided with contact information for the PCP's office or ED? Yes  Was to pt encouraged to call back with questions or concerns? Yes

## 2020-10-19 ENCOUNTER — Other Ambulatory Visit: Payer: Self-pay

## 2020-10-19 ENCOUNTER — Ambulatory Visit (INDEPENDENT_AMBULATORY_CARE_PROVIDER_SITE_OTHER): Payer: No Typology Code available for payment source | Admitting: Family

## 2020-10-19 VITALS — BP 131/83 | HR 94 | Temp 98.6°F | Resp 16 | Wt 214.0 lb

## 2020-10-19 DIAGNOSIS — I5021 Acute systolic (congestive) heart failure: Secondary | ICD-10-CM

## 2020-10-19 DIAGNOSIS — I2510 Atherosclerotic heart disease of native coronary artery without angina pectoris: Secondary | ICD-10-CM

## 2020-10-19 DIAGNOSIS — E876 Hypokalemia: Secondary | ICD-10-CM | POA: Diagnosis not present

## 2020-10-19 DIAGNOSIS — E785 Hyperlipidemia, unspecified: Secondary | ICD-10-CM

## 2020-10-19 DIAGNOSIS — G4733 Obstructive sleep apnea (adult) (pediatric): Secondary | ICD-10-CM | POA: Diagnosis not present

## 2020-10-19 DIAGNOSIS — I10 Essential (primary) hypertension: Secondary | ICD-10-CM

## 2020-10-19 DIAGNOSIS — I159 Secondary hypertension, unspecified: Secondary | ICD-10-CM

## 2020-10-19 NOTE — Patient Instructions (Signed)
Increase Tresiba by 2 units every 2 days until your fasting sugar is 110.  Do not go past 50 units before you see me back

## 2020-10-19 NOTE — Progress Notes (Signed)
Subjective:    Patient ID: Catherine Espinoza, female    DOB: 12/04/1975, 45 y.o.   MRN: [id number]  HPI  Patient is a 45 yr old female who presents today for hospital follow up.  She was hospitalized 10/11/20-10/16/20 with newly diagnosed acute systolic CHF (EF <31%), severe ischemic and NICM and CAD. S/p cardiac cath with PTCA/DES x1 of mid LAD.  She was diuresed inpatient with IV lasix which was transitioned to lasix 80m daily as needed for 3 pound weight gain. She sees cardiology on 10/26/19.  She has been weighing every day.  Only takes lasix if she gains 3+ pounds overnight.  Surprisingly, she denies any current or previous history of chest pain or sob.  She states her only symptoms was PND which has resolved following diureses/stent placement.   DM2- Farxiga 127mwas added during her hospitalization in addition to her [place] units once daily.  She has been checking her sugar at home.  Sugars have been 150-250 mostly.    Tobacco abuse- she is smoking less.  Boyfriend wants to quit with her.    Wt Readings from Last 3 Encounters:  10/19/20 214 lb (97.1 kg)  10/16/20 220 lb 8 oz (100 kg)  10/03/20 245 lb (111.1 kg)   She reports resolution of the abscess in her right groin without any further drainage. Review of Systems See HPI  Past Medical History:  Diagnosis Date  . Anemia, unspecified   . Chronic left-sided thoracic back pain   . Excessive or frequent menstruation   . Morbid obesity ([facility]  . Obstructive sleep apnea 02/11/11   Sleep study: severe OSA- rec CPAP 20cm small  full face mask  . Proteinuria   . Thoracic radiculopathy   . Type II or unspecified type diabetes mellitus without mention of complication, not stated as uncontrolled   . Unspecified essential hypertension      Social History   Socioeconomic History  . Marital status: Single    Spouse name: Not on file  . Number of children: 1  . Years of education: Not on file  . Highest education level: Associate  degree: occupational, teHotel manageror vocational program  Occupational History  . Occupation: cna/med teEngineer, [employer]  Tobacco Use  . Smoking status: Current Every Day Smoker    Packs/day: 0.50    Years: 10.00    Pack years: 5.00    Types: Cigarettes  . Smokeless tobacco: Never Used  Vaping Use  . Vaping Use: Never used  Substance and Sexual Activity  . Alcohol use: Yes    Alcohol/week: 0.0 standard drinks    Comment: occasional use  . Drug use: No  . Sexual activity: Yes    Birth control/protection: Surgical  Other Topics Concern  . Not on file  Social History Narrative   MaGerome Samor divorced   Daughter born 2003   Works as cna/med teFirefightereterminants of Health   Financial Resource Strain: High Risk  . Difficulty of Paying Living Expenses: Hard  Food Insecurity: No Food Insecurity  . Worried About RuCharity fundraisern the Last Year: Never true  . Ran Out of Food in the Last Year: Never true  Transportation Needs: No Transportation Needs  . Lack of Transportation (Medical): No  . Lack of Transportation (Non-Medical): No  Physical Activity: Not on file  Stress: Not on file  Social Connections: Not on file  Intimate Partner Violence: Not At Risk  .  Fear of Current or Ex-Partner: No  . Emotionally Abused: No  . Physically Abused: No  . Sexually Abused: No    Past Surgical History:  Procedure Laterality Date  . ABDOMINAL HYSTERECTOMY    . ABLATION COLPOCLESIS    . CESAREAN SECTION    . CLEFT PALATE REPAIR    . CORONARY STENT INTERVENTION N/A 10/13/2020   Procedure: CORONARY STENT INTERVENTION;  Surgeon: Burnell Blanks, MD;  Location: [facility];  Service: Cardiovascular;  Laterality: N/A;  . cyst removal    . ECTOPIC PREGNANCY SURGERY     x 2  . OOPHORECTOMY Right 2002  . RIGHT/LEFT HEART CATH AND CORONARY ANGIOGRAPHY N/A 10/13/2020   Procedure: RIGHT/LEFT HEART CATH AND CORONARY ANGIOGRAPHY;  Surgeon: Jolaine Artist, MD;  Location: [facility];  Service: Cardiovascular;  Laterality: N/A;    Family History  Problem Relation Age of Onset  . Heart attack Maternal Aunt   . Diabetes Mother   . Cancer Father        oral cancer    Allergies  Allergen Reactions  . Ibuprofen     REACTION: knots in mouth with SOB  . Labetalol Nausea Only  . Aspirin     Knots in mouth     Current Outpatient Medications on File Prior to Visit  Medication Sig Dispense Refill  . aspirin 81 MG chewable tablet Chew 1 tablet (81 mg total) by mouth daily. 30 tablet 3  . BAYER MICROLET LANCETS lancets Use as instructed to check blood sugar three times daily.  DX  E11.65 (Patient taking differently: Use as instructed to check blood sugar three times daily.  DX  E11.65) 100 each 5  . blood glucose meter kit and supplies KIT Dispense based on patient and insurance preference. Use up to four times daily as directed. (FOR ICD-9 250.00, 250.01). 1 each 0  . carvedilol (COREG) 3.125 MG tablet Take 1 tablet (3.125 mg total) by mouth 2 (two) times daily with a meal. 60 tablet 2  . clopidogrel (PLAVIX) 75 MG tablet Take 1 tablet (75 mg total) by mouth daily. 30 tablet 3  . Continuous Blood Gluc Receiver (FREESTYLE LIBRE 2 READER) DEVI Use as directed 1 each 0  . Continuous Blood Gluc Sensor (FREESTYLE LIBRE 2 SENSOR) MISC Use as directed 2 each 12  . Continuous Blood Gluc Sensor (FREESTYLE LIBRE SENSOR SYSTEM) MISC Apply every 14 days. 1 each 0  . dapagliflozin propanediol (FARXIGA) 10 MG TABS tablet Take 1 tablet (10 mg total) by mouth daily. 30 tablet 3  . doxycycline (VIBRA-TABS) 100 MG tablet Take 1 tablet (100 mg total) by mouth 2 (two) times daily. 20 tablet 0  . furosemide (LASIX) 40 MG tablet Take 1 tablet (40 mg total) by mouth daily as needed for fluid or edema. For every 3 lb weight gain 30 tablet 3  . glucose blood (BAYER CONTOUR TEST) test strip Use to check blood sugar 3 times per day.  DX  E11.65 (Patient  taking differently: Use to check blood sugar 3 times per day.  DX  E11.65) 100 each 5  . insulin degludec (TRESIBA) 100 UNIT/ML FlexTouch Pen Inject 35 Units into the skin daily. 15 mL 3  . Insulin Syringe 27G X 1/2" 0.5 ML MISC Use as directed 100 each 3  . rosuvastatin (CRESTOR) 40 MG tablet Take 1 tablet (40 mg total) by mouth daily. 30 tablet 3  . sacubitril-valsartan (ENTRESTO) 49-51 MG Take 1 tablet  by mouth 2 (two) times daily. 60 tablet 3  . spironolactone (ALDACTONE) 25 MG tablet Take 1 tablet (25 mg total) by mouth daily. 30 tablet 3  . valACYclovir (VALTREX) 1000 MG tablet Take 1 tablet by mouth once daily for 5 days at start of rash 15 tablet 1   No current facility-administered medications on file prior to visit.    BP 131/83 (BP Location: Left Arm, Patient Position: Sitting, Cuff Size: Large)   Pulse 94   Temp 98.6 F (37 C) (Oral)   Resp 16   Wt 214 lb (97.1 kg)   LMP 09/13/2011   SpO2 98%   BMI 35.61 kg/m       Objective:   Physical Exam Constitutional:      Appearance: Normal appearance. She is well-developed and well-nourished.  HENT:     Head: Normocephalic and atraumatic.  Cardiovascular:     Rate and Rhythm: Normal rate and regular rhythm.     Heart sounds: Normal heart sounds. No murmur heard.   Pulmonary:     Effort: Pulmonary effort is normal. No respiratory distress.     Breath sounds: Normal breath sounds. No wheezing.  Musculoskeletal:     Right lower leg: No edema.     Left lower leg: No edema.  Skin:    General: Skin is warm and dry.  Neurological:     Mental Status: She is alert.  Psychiatric:        Attention and Perception: Attention normal.        Mood and Affect: Mood and affect and mood normal.        Speech: Speech normal.        Behavior: Behavior normal.        Thought Content: Thought content normal.        Judgment: Judgment normal.           Assessment & Plan:  CAD- newly diagnosed. S/p DES to mid LAD- plan for  ASA/Plavix/Statin. She is advised to keep her upcoming appointment with cardiology on 10/25/20. Denies CP.  CHF(ICM/NICM)- clinically compensated.  She is on aldactone, entresto and prn lasix.  She is down 31 pounds from prior to admission.  Obtain follow up cmet.   Tobacco abuse- we discussed that her uncontrolled DM2 and tobacco abuse are the main contributors to her heart disease and continuing to smoke will very likely lead to additional cardiac problems that could be life threatening to her. We discussed trying the nicotine patch 14 mcg for the next 8 weeks or so, then decreasing to 7 mcg once daily.  DM2- A1C was extremely high due to pt being off of insulin.  Number are improving but remain above goal.  Plan to continue farxiga 67m and Tresiba. Recommended that pt adjust TTyler Aasas below:    Increase Tresiba by 2 units every 2 days until your fasting sugar is 110.  Do not go past 50 units before you see me back    hidradenitis suppurativa- currently stable. Monitor.  HTN- BP stable/improved. Continue coreg 3.1246mbid, aldactone, entresto.  BP Readings from Last 3 Encounters:  10/19/20 131/83  10/16/20 (!) 135/97  10/03/20 (!) 145/95   Hyperlipidemia- on crestor 4090m Goal LDL is <70. She will need follow up lipid panel in 4-6 weeks.  OSA- She has not been on cpap for several years. Will arrange consult with sleep specialist for follow up sleep study/evaluation.   >40 minutes spent on today's visit. Time was spent  counseling patient, reviewing outside records and formulating medical plan.    This visit occurred during the SARS-CoV-2 public health emergency.  Safety protocols were in place, including screening questions prior to the visit, additional usage of staff PPE, and extensive cleaning of exam room while observing appropriate contact time as indicated for disinfecting solutions.

## 2020-10-20 ENCOUNTER — Telehealth (HOSPITAL_COMMUNITY): Payer: Self-pay

## 2020-10-20 NOTE — Telephone Encounter (Signed)
Pharmacy Transitions of Care Follow-up Telephone Call  Date of discharge: 10/16/20 Discharge Diagnosis: R/L Heart Cath and Coronary Angioplasty  How have you been since you were released from the hospital? Patient has been well, had her first F/U yesterday and has another planned with Cardiology next week. No issues with meds at this time  Medication changes made at discharge: yes  Medication changes obtained and verified? yes    Medication Accessibility:  [facility] Pharmacy: [facility] [place]  Was the patient provided with refills on discharged medications? yes  Have all prescriptions been transferred from [facility] to home pharmacy? yes  . Is the patient able to afford medications? yes     Medication Review:  CLOPIDOGREL (PLAVIX) Clopidogrel 75 mg once daily.  - Advised patient of medications to avoid (NSAIDs, ASA)  - Educated that Tylenol (acetaminophen) will be the preferred analgesic to prevent risk of bleeding  - Emphasized importance of monitoring for signs and symptoms of bleeding (abnormal bruising, prolonged bleeding, nose bleeds, bleeding from gums, discolored urine, black tarry stools)  - Advised patient to alert all providers of anticoagulation therapy prior to starting a new medication or having a procedure    Follow-up Appointments:  PCP Hospital f/u appt confirmed? Yes, Saw [facility] f/u appt confirmed? Yes, sees Cardiology on 10/25/20  If their condition worsens, is the pt aware to call PCP or go to the Emergency Dept.? yes  Final Patient Assessment: Patient is doing well with no medication issues at this time. Has follow ups scheduled.

## 2020-10-22 ENCOUNTER — Encounter: Payer: Self-pay | Admitting: Family

## 2020-10-22 ENCOUNTER — Telehealth: Payer: Self-pay | Admitting: Family

## 2020-10-22 DIAGNOSIS — I2510 Atherosclerotic heart disease of native coronary artery without angina pectoris: Secondary | ICD-10-CM | POA: Insufficient documentation

## 2020-10-22 NOTE — Telephone Encounter (Signed)
Please advise pt that I would like her to have a consult with the sleep doctor for follow up on her sleep apnea.  Referral has been placed. Please give her the number to [facility] to schedule her appointment herself.

## 2020-10-23 NOTE — Progress Notes (Addendum)
Advanced Heart Failure Clinic Note   PCP: [name], NP PCP-Cardiologist: No primary care provider on file.  HF Cardiologist: Dr. Haroldine Laws  Reason for visit/CC: Post-hospital follow up for HF  HPI: Catherine Espinoza is a 45 y.o. F PMH: T2DM, HTN, morbid obesity BMI 40.  She presented to ED on 10/11/20 for leg swelling and pain, recently off all meds due to cost.  Was being evaluated outpatient had a negative doppler for DVT and started on doxycycline for possible cellulitis and lasix 20 mg PRN to help with swelling. CT in ED showed no PE but small pericardial effusion/thickening, reflux of CTX into hepatic veins with moderate opacities in both lungs, dilated main pulmonary artery with R axillary lymphadenopathy. She was admitted to Hospitalist and started on lasix 40 IV BID and doxycycline continued for axillary abscess. Cardiology recommended continue lasix IV BID and echo showed severe LV dysfunction. R/LHC completed; patient had PCI to LAD, preserved CO, elevated filling pressures. She was diuresed and started on GDMT. She was discharged on Lasix 40 mg prn 3 pound weight gain, carvedilol 3.125 mg bid, Entresto 49/51 mg bid, spiro 25 mg daily, Crestor 40 mg daily, asa 81 mg daily, and Plavix 75 mg daily. Her discharge weight was 220 lbs.  Today she returns for post-hospital follow up. Overall feeling fine. Has no SOB with walking, grocery shopping or ADLs. Denies increasing SOB, CP, dizziness, edema, or PND/Orthopnea. Appetite ok. No fever or chills. Weight at home ~209-210 pounds. Taking all medications. Smoking 3-4 cigarettes/day. 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  Cardiac Studies  Echo 1/22: EF 20-25% with global hypokinesis, mild LVH, grade I DD, RVSP 54, small pericardial effusion and IVC dilated w/ RAP 15.  R/LHC 1/22: RHC/LHC: PCI to LAD  Prox RCA lesion is 30% stenosed.  RPDA lesion is 95%  stenosed.  RPAV lesion is 99% stenosed.  Prox LAD to Mid LAD lesion is 40% stenosed.  Mid LAD lesion is 80% stenosed.  2nd Diag lesion is 50% stenosed.  Findings: Ao = 147/97 (119) LV = 140/27 RA = 12 RV = 68/15 PA = 72/37 (51) PCW = 34 Fick cardiac output/index = 6.3/2.9 PVR = 2.6 WU FA sat = 97% PA sat = 72%, 72%  Assessment: 1. 2v CAD with high grade lesions in the mid LAD and distal RCA 2. 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 Markedly elevated filling pressures with normal cardiac output  Plan/Discussion: Plan PCI of LAD followed by aggressive diuresis and titration of GDMT  ROS: All systems reviewed and negative except as per HPI.   Past Medical History:  Diagnosis Date  . Anemia, unspecified   . CAD (coronary artery disease)   . Chronic left-sided thoracic back pain   . Excessive or frequent menstruation   . Morbid obesity ([place])   . Obstructive sleep apnea 02/11/11   Sleep study: severe OSA- rec CPAP 20cm small  full face mask  . Proteinuria   . Thoracic radiculopathy   . Type II or unspecified type diabetes mellitus without mention of complication, not stated as uncontrolled   . Unspecified essential hypertension     Current Outpatient Medications  Medication Sig Dispense Refill  . aspirin 81 MG chewable tablet Chew 1 tablet (81 mg total) by mouth daily. 30 tablet 3  . BAYER MICROLET LANCETS lancets Use as instructed to check blood sugar three times daily.  DX  E11.65 (Patient taking differently: Use as instructed to check blood sugar three times daily.  DX  E11.65) 100 each 5  . blood glucose meter kit and supplies KIT Dispense based on patient and insurance preference. Use up to four times daily as directed. (FOR ICD-9 250.00, 250.01). 1 each 0  . carvedilol (COREG) 3.125 MG tablet Take 1 tablet (3.125 mg total) by mouth 2 (two) times daily with a meal. 60 tablet 2  . clopidogrel (PLAVIX) 75 MG tablet Take 1 tablet (75 mg total) by mouth  daily. 30 tablet 3  . Continuous Blood Gluc Receiver (FREESTYLE LIBRE 2 READER) DEVI Use as directed 1 each 0  . Continuous Blood Gluc Sensor (FREESTYLE LIBRE 2 SENSOR) MISC Use as directed 2 each 12  . Continuous Blood Gluc Sensor (FREESTYLE LIBRE SENSOR SYSTEM) MISC Apply every 14 days. 1 each 0  . dapagliflozin propanediol (FARXIGA) 10 MG TABS tablet Take 1 tablet (10 mg total) by mouth daily. 30 tablet 3  . furosemide (LASIX) 40 MG tablet Take 1 tablet (40 mg total) by mouth daily as needed for fluid or edema. For every 3 lb weight gain 30 tablet 3  . glucose blood (BAYER CONTOUR TEST) test strip Use to check blood sugar 3 times per day.  DX  E11.65 (Patient taking differently: Use to check blood sugar 3 times per day.  DX  E11.65) 100 each 5  . insulin degludec (TRESIBA) 100 UNIT/ML FlexTouch Pen Inject 35 Units into the skin daily. 15 mL 3  . Insulin Syringe 27G X 1/2" 0.5 ML MISC Use as directed 100 each 3  . rosuvastatin (CRESTOR) 40 MG tablet Take 1 tablet (40 mg total) by mouth daily. 30 tablet 3  . sacubitril-valsartan (ENTRESTO) 49-51 MG Take 1 tablet by mouth 2 (two) times daily. 60 tablet 3  . spironolactone (ALDACTONE) 25 MG tablet Take 1 tablet (25 mg total) by mouth daily. 30 tablet 3  . valACYclovir (VALTREX) 1000 MG tablet Take 1 tablet by mouth once daily for 5 days at start of rash 15 tablet 1   No current facility-administered medications for this encounter.    Allergies  Allergen Reactions  . Ibuprofen     REACTION: knots in mouth with SOB  . Labetalol Nausea Only  . Aspirin     Knots in mouth       Social History   Socioeconomic History  . Marital status: Single    Spouse name: Not on file  . Number of children: 1  . Years of education: Not on file  . Highest education level: Associate degree: occupational, Hotel manager, or vocational program  Occupational History  . Occupation: cna/med Engineer, production: [employer]  Tobacco Use  . Smoking status:  Current Every Day Smoker    Packs/day: 0.50    Years: 10.00    Pack years: 5.00    Types: Cigarettes  . Smokeless tobacco: Never Used  Vaping Use  . Vaping Use: Never used  Substance and Sexual Activity  . Alcohol use: Yes    Alcohol/week: 0.0 standard drinks    Comment: occasional use  . Drug use: No  . Sexual activity: Yes    Birth control/protection: Surgical  Other Topics Concern  . 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 Difficulty of Paying  Living Expenses: Hard  Food Insecurity: No Food Insecurity  . Worried About Charity fundraiser in the Last Year: Never true  . Ran Out of Food in the Last Year: Never true  Transportation Needs: No Transportation Needs  . Lack of Transportation (Medical): No  . Lack of Transportation (Non-Medical): No  Physical Activity: Not on file  Stress: Not on file  Social Connections: Not on file  Intimate Partner Violence: Not At Risk  . Fear of Current or Ex-Partner: No  . Emotionally Abused: No  . Physically Abused: No  . Sexually Abused: No     Family History  Problem Relation Age of Onset  . Heart attack Maternal Aunt   . Diabetes Mother   . Cancer Father        oral cancer    Vitals:   10/25/20 1013  BP: (!) 141/96  Pulse: 89  SpO2: 98%  Weight: 97.9 kg (215 lb 12.8 oz)    PHYSICAL EXAM: General:  NAD. No resp difficulty HEENT: Normal Neck: Supple. No JVD. Carotids 2+ bilat; no bruits. No lymphadenopathy or thryomegaly appreciated. Cor: PMI nondisplaced. Regular rate & rhythm. No rubs, gallops or murmurs. Lungs: Clear Abdomen: Obese, soft, nontender, nondistended. No hepatosplenomegaly. No bruits or masses. Good bowel sounds. Extremities: No cyanosis, clubbing, rash, edema Neuro: alert & oriented x 3, cranial nerves grossly intact. Moves all 4 extremities w/o  difficulty. Affect pleasant.  ECG: SR, 90 bpm (personally reviewed).  ASSESSMENT & PLAN: 1. Chronic Systolic Heart Failure - Echo this admission shows EF 20-25% with global hypokinesis, mild LVH, grade I DD, RVSP 54, small pericardial effusion and IVC dilated w/ RAP 15. CT showed small pericardial effusion, reflux in hepatic veins with moderate opacities in both lungs, dilated main pulmonary artery. NICM/ICM. Had PCI to LAD + HTN. - NYHA II, although not physically active. Euvolemic today. - Increase Entresto to 97/103 mg bid. - Continue Lasix 40 mg as needed for 3 pound weight gain; does not need daily diuretic. - Continue carvedilol 3.125 mg bid. Consider increasing at next visit. - Continue spiro 25 mg daily. - Continue Farxiga 10 mg daily. - Plan to repeat ECHO in 3 months after HF meds optimized.   - BMET today, repeat in 7-10 days.  2. CAD - S/P PCI LAD. - On Aspirin + crestor + plavix.  - No CP. - CBC today.  3. Uncontrolled T2DM - A1c 13.5% (previously 9.6%) - Per PCP - Continue farxiga for HF/DM.  4. Hypertension - Still elevated, Entresto as above.  - Continue current.  5. Obesity - Body mass index is 36.69 kg/m. - Discussed portion control and low-carb diet options.  6. Hyperkalemia - Resolved. Stopped KCl supp. - BMET today.  7.Hidradenitis - Continue with doxycycline.  8. Tobacco use - Down from 1 ppd to 3-4 cigarettes/day. Nicotine patch. - Encouraged to quit.  Follow up with PharmD in 3 weeks for further medication titration and in APP clinic in 6 weeks. - OK to return to work if she feels up to it, as long as she can take rest breaks.  Ada, FNP 10/25/20

## 2020-10-23 NOTE — Telephone Encounter (Signed)
Patient advised of new referral, phone number provided

## 2020-10-25 ENCOUNTER — Telehealth: Payer: Self-pay

## 2020-10-25 ENCOUNTER — Ambulatory Visit (HOSPITAL_COMMUNITY)
Admit: 2020-10-25 | Discharge: 2020-10-25 | Disposition: A | Discharge status: Home / Self Care | Payer: No Typology Code available for payment source | Source: Ambulatory Visit | Attending: Family Medicine | Admitting: Family Medicine

## 2020-10-25 ENCOUNTER — Other Ambulatory Visit (HOSPITAL_COMMUNITY): Payer: Self-pay | Admitting: Family Medicine

## 2020-10-25 ENCOUNTER — Encounter (HOSPITAL_COMMUNITY): Payer: Self-pay

## 2020-10-25 ENCOUNTER — Other Ambulatory Visit: Payer: Self-pay

## 2020-10-25 VITALS — BP 141/96 | HR 89 | Wt 215.8 lb

## 2020-10-25 DIAGNOSIS — Z794 Long term (current) use of insulin: Secondary | ICD-10-CM | POA: Diagnosis not present

## 2020-10-25 DIAGNOSIS — I5021 Acute systolic (congestive) heart failure: Secondary | ICD-10-CM | POA: Diagnosis not present

## 2020-10-25 DIAGNOSIS — E119 Type 2 diabetes mellitus without complications: Secondary | ICD-10-CM | POA: Diagnosis not present

## 2020-10-25 DIAGNOSIS — Z8249 Family history of ischemic heart disease and other diseases of the circulatory system: Secondary | ICD-10-CM | POA: Diagnosis not present

## 2020-10-25 DIAGNOSIS — K219 Gastro-esophageal reflux disease without esophagitis: Secondary | ICD-10-CM | POA: Insufficient documentation

## 2020-10-25 DIAGNOSIS — Z833 Family history of diabetes mellitus: Secondary | ICD-10-CM | POA: Insufficient documentation

## 2020-10-25 DIAGNOSIS — E1165 Type 2 diabetes mellitus with hyperglycemia: Secondary | ICD-10-CM | POA: Diagnosis not present

## 2020-10-25 DIAGNOSIS — I10 Essential (primary) hypertension: Secondary | ICD-10-CM

## 2020-10-25 DIAGNOSIS — Z720 Tobacco use: Secondary | ICD-10-CM

## 2020-10-25 DIAGNOSIS — L732 Hidradenitis suppurativa: Secondary | ICD-10-CM | POA: Diagnosis not present

## 2020-10-25 DIAGNOSIS — I5022 Chronic systolic (congestive) heart failure: Secondary | ICD-10-CM | POA: Diagnosis not present

## 2020-10-25 DIAGNOSIS — F17210 Nicotine dependence, cigarettes, uncomplicated: Secondary | ICD-10-CM | POA: Diagnosis not present

## 2020-10-25 DIAGNOSIS — Z8639 Personal history of other endocrine, nutritional and metabolic disease: Secondary | ICD-10-CM

## 2020-10-25 DIAGNOSIS — Z7902 Long term (current) use of antithrombotics/antiplatelets: Secondary | ICD-10-CM | POA: Insufficient documentation

## 2020-10-25 DIAGNOSIS — I2510 Atherosclerotic heart disease of native coronary artery without angina pectoris: Secondary | ICD-10-CM

## 2020-10-25 DIAGNOSIS — I110 Hypertensive heart disease with heart failure: Secondary | ICD-10-CM | POA: Diagnosis present

## 2020-10-25 DIAGNOSIS — Z79899 Other long term (current) drug therapy: Secondary | ICD-10-CM | POA: Diagnosis not present

## 2020-10-25 DIAGNOSIS — Z7982 Long term (current) use of aspirin: Secondary | ICD-10-CM | POA: Insufficient documentation

## 2020-10-25 DIAGNOSIS — E875 Hyperkalemia: Secondary | ICD-10-CM | POA: Insufficient documentation

## 2020-10-25 DIAGNOSIS — Z886 Allergy status to analgesic agent status: Secondary | ICD-10-CM | POA: Diagnosis not present

## 2020-10-25 DIAGNOSIS — I428 Other cardiomyopathies: Secondary | ICD-10-CM | POA: Insufficient documentation

## 2020-10-25 DIAGNOSIS — Z955 Presence of coronary angioplasty implant and graft: Secondary | ICD-10-CM | POA: Diagnosis not present

## 2020-10-25 DIAGNOSIS — Z6836 Body mass index (BMI) 36.0-36.9, adult: Secondary | ICD-10-CM | POA: Diagnosis not present

## 2020-10-25 DIAGNOSIS — I313 Pericardial effusion (noninflammatory): Secondary | ICD-10-CM | POA: Diagnosis not present

## 2020-10-25 LAB — CBC
HCT: 49.3 % — ABNORMAL HIGH (ref 36.0–46.0)
Hemoglobin: 15.5 g/dL — ABNORMAL HIGH (ref 12.0–15.0)
MCH: 28.6 pg (ref 26.0–34.0)
MCHC: 31.4 g/dL (ref 30.0–36.0)
MCV: 91 fL (ref 80.0–100.0)
Platelets: 298 10*3/uL (ref 150–400)
RBC: 5.42 MIL/uL — ABNORMAL HIGH (ref 3.87–5.11)
RDW: 13 % (ref 11.5–15.5)
WBC: 7 10*3/uL (ref 4.0–10.5)
nRBC: 0 % (ref 0.0–0.2)

## 2020-10-25 LAB — BASIC METABOLIC PANEL
Anion gap: 9 (ref 5–15)
BUN: 19 mg/dL (ref 6–20)
CO2: 23 mmol/L (ref 22–32)
Calcium: 9.2 mg/dL (ref 8.9–10.3)
Chloride: 103 mmol/L (ref 98–111)
Creatinine, Ser: 0.7 mg/dL (ref 0.44–1.00)
GFR, Estimated: >60 mL/min (ref >60)
Glucose, Bld: 155 mg/dL — ABNORMAL HIGH (ref 70–99)
Potassium: 4.3 mmol/L (ref 3.5–5.1)
Sodium: 135 mmol/L (ref 135–145)

## 2020-10-25 MED ORDER — ENTRESTO 97-103 MG PO TABS: 1.0000 | ORAL_TABLET | Freq: Two times a day (BID) | ORAL | 3 refills | Status: DC

## 2020-10-25 MED FILL — ENTRESTO 97 MG-103 MG TAB: 97-103 | 30 days supply | Qty: 60 | Fill #0

## 2020-10-25 NOTE — Telephone Encounter (Signed)
Pt called stating she saw her heart doctor today and did labs there which included the ones Melissa had ordered.  If for any reason something was not drawn for pt, please let her know and she will R/S her lab appt with [place].

## 2020-10-25 NOTE — Patient Instructions (Signed)
INCREASE Entresto to 97/103mg  twice daily  Routine lab work today. Will notify you of abnormal results  Repeat labs in 7-10 days  Follow up with PharmD in 3 weeks  Follow up in 6 weeks  Do the following things EVERYDAY: 1) Weigh yourself in the morning before breakfast. Write it down and keep it in a log. 2) Take your medicines as prescribed 3) Eat low salt foods--Limit salt (sodium) to 2000 mg per day.  4) Stay as active as you can everyday 5) Limit all fluids for the day to less than 2 liters

## 2020-10-25 NOTE — Telephone Encounter (Signed)
Patient advised labs ordered by [facility] are different that the ones done at the cardiologist. She will call back to schedule lab appointment once she gets her work schedul.

## 2020-10-26 ENCOUNTER — Other Ambulatory Visit: Payer: No Typology Code available for payment source

## 2020-11-01 ENCOUNTER — Other Ambulatory Visit (HOSPITAL_COMMUNITY): Payer: No Typology Code available for payment source

## 2020-11-03 NOTE — Progress Notes (Signed)
PCP: [name], NP PCP-Cardiologist: No primary care provider on file.  HF Cardiologist: Dr. Haroldine Laws  HPI:   Catherine Leggettis a 45 y.o. F PMH: T2DM, HTN, morbid obesity BMI 40.  She presented to ED on 10/11/20 for leg swelling and pain, recently off all meds due to cost. Was being evaluated outpatient had a negative doppler for DVT and started on doxycycline for possible cellulitis and furosemide 20 mg PRN to help with swelling. CT in ED showed no PE but small pericardial effusion/thickening, reflux of CTX into hepatic veins with moderate opacities in both lungs, dilated main pulmonary artery with R axillary lymphadenopathy. She was admitted to Hospitalist and started on furosemide 40 IV BID and doxycycline continued for axillary abscess. Cardiology recommended continue furosemide IV BID and echo showed severe LV dysfunction. R/LHC completed; patient had PCI to LAD, preserved CO, elevated filling pressures. She was diuresed and started on GDMT. She was discharged on furosemide 40 mg PRN 3 pound weight gain, carvedilol 3.125 mg BID, Entresto 49/51 mg BID, spironolactone 25 mg daily, rosuvastatin 40 mg daily, asa 81 mg daily, and clopidogrel 75 mg daily. Her discharge weight was 220 lbs.  She returned to HF Clinic for post-hospital follow up on 10/25/20. Was overall feeling fine. Had no SOB with walking, grocery shopping or ADLs. Denied increasing SOB, CP, dizziness, edema, or PND/Orthopnea. Appetite was okay. No fever or chills. Weight at home was ~209-210 pounds. Reported taking all medications. Was smoking 3-4 cigarettes/day. 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  Today she returns to HF clinic for pharmacist medication titration. At last visit with FNP, Delene Loll dose was increased to 97-103 mg BID. Overall she is doing well today. No dizziness, lightheadedness, chest pain or palpitations. No SOB/DOE. Her weight has  been increasing at home, but she attributes this to diet and not fluid. Has not needed any PRN furosemide. No LEE, PND or orthopnea. Taking all medications as prescribed and tolerating all medications.    HF Medications: Carvedilol 3.125 mg BID Entresto 97-103 mg BID Spironolactone 25 mg daily Farxiga 10 mg daily Furosemide 40 mg PRN   Has the patient been experiencing any side effects to the medications prescribed?  No, patient has been tolerating her medications well.   Does the patient have any problems obtaining medications due to transportation or finances?   No, patient has Architect.   Understanding of regimen: good Understanding of indications: good Potential of compliance: good Patient understands to avoid NSAIDs. Patient understands to avoid decongestants.    Pertinent Lab Values (10/25/20) . Serum creatinine 0.70, BUN 19, Potassium 4.3, Sodium 135   Vital Signs: . Weight: 227.6 (last clinic weight: 215.8 lbs) . Blood pressure: 154/94  . Heart rate: 90  Assessment/Plan: 1. Chronic Systolic Heart Failure - Echo 10/12/20 showed EF 20-25% with global hypokinesis, mild LVH, grade I DD, RVSP 54, small pericardial effusion and IVC dilated with RAP 15. CT showed small pericardial effusion, reflux in hepatic veins with moderate opacities in both lungs, dilated main pulmonary artery. NICM/ICM. Had PCI to LAD + HTN. - NYHA II,  Euvolemic on exam. She attributes weight gain to dietary indiscretions. Has not needed any PRN furosemide.  - Continue furosemide 40 mg PRN - Increase carvedilol to 6.25 mg BID - Continue Entresto 97/103 mg BID. - Continue spironolactone 25 mg daily. - Continue Farxiga 10 mg daily. - [facility] with [facility] on  12/07/20.  2. CAD - S/P PCI LAD. - On Aspirin, rosuvastatin, and clopidogrel.  - No CP.  3. Uncontrolled T2DM - A1c 13.5% on 10/04/20 (previously 9.6% on 03/15/20) - Per PCP - Continue farxiga for HF/DM.  4.  Hypertension - Continue Entresto.  5. Obesity - Body mass index is 36.69 kg/m. - Portion control and low-carb diet options were previously discussed.  6. Hyperkalemia - Resolved. Stopped KCl supp.  7.Hidradenitis - Doxycycline was stopped during 10/19/20 visit  8. Tobacco use - Down from 1 ppd to 3-4 cigarettes/day. Nicotine patch. - Previously encouraged to quit.   Audry Riles, PharmD, BCPS, BCCP, CPP Heart Failure Clinic Pharmacist [phone]

## 2020-11-06 MED FILL — ENTRESTO 97 MG-103 MG TAB: 97-103 | 30 days supply | Qty: 60 | Fill #0

## 2020-11-06 MED FILL — FREESTYLE LIBRE 2 SENSOR MI: 28 days supply | Qty: 2 | Fill #1

## 2020-11-13 MED FILL — ASPIRIN CHILD 81 MG TAB CHE: 81 | 36 days supply | Qty: 36 | Fill #0

## 2020-11-13 MED FILL — CLOPIDOGREL 75 MG TABLET: 75 | 30 days supply | Qty: 30 | Fill #0

## 2020-11-13 MED FILL — SPIRONOLACTONE 25 MG TABS: 25 | 30 days supply | Qty: 30 | Fill #0

## 2020-11-13 MED FILL — CARVEDILOL 3.125 MG TABLET: 3.125 | 30 days supply | Qty: 60 | Fill #0

## 2020-11-13 MED FILL — FARXIGA 10 MG TABLET: 10 | 30 days supply | Qty: 30 | Fill #0

## 2020-11-13 MED FILL — TRESIBA FLEXTOUCH 100 UNITS: 100 | 30 days supply | Qty: 12 | Fill #0

## 2020-11-13 MED FILL — ROSUVASTATIN CALCIUM 40 MG: 40 | 30 days supply | Qty: 30 | Fill #0

## 2020-11-14 NOTE — Progress Notes (Incomplete)
***In Progress***  PCP: [name], NP PCP-Cardiologist: No primary care provider on file.  HF Cardiologist: Dr. Haroldine Laws  HPI:   Catherine Leggettis a 45 y.o. F PMH: T2DM, HTN, morbid obesity BMI 40.  She presented to ED on 10/11/20 for leg swelling and pain, recently off all meds due to cost. Was being evaluated outpatient had a negative doppler for DVT and started on doxycycline for possible cellulitis and furosemide 20 mg PRN to help with swelling. CT in ED showed no PE but small pericardial effusion/thickening, reflux of CTX into hepatic veins with moderate opacities in both lungs, dilated main pulmonary artery with R axillary lymphadenopathy. She was admitted to Hospitalist and started on furosemide 40 IV BID and doxycycline continued for axillary abscess. Cardiology recommended continue furosemide IV BID and echo showed severe LV dysfunction. R/LHC completed; patient had PCI to LAD, preserved CO, elevated filling pressures. She was diuresed and started on GDMT. She was discharged on furosemide 40 mg PRN 3 pound weight gain, carvedilol 3.125 mg BID, Entresto 49/51 mg BID, spironolactone 25 mg daily, rosuvastatin 40 mg daily, asa 81 mg daily, and clopidogrel 75 mg daily. Her discharge weight was 220 lbs.  She returned for post-hospital follow up on 10/25/20. Was overall feeling fine. Had no SOB with walking, grocery shopping or ADLs. Denied increasing SOB, CP, dizziness, edema, or PND/Orthopnea. Appetite was okay. No fever or chills. Weight at home was ~209-210 pounds. Reported taking all medications. Was smoking 3-4 cigarettes/day. 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  Today she returns to HF clinic for pharmacist medication titration. At last visit with FNP, Catherine Espinoza dose was increased to 97-103 mg BID.   Overall feeling ***. Dizziness, lightheadedness, fatigue:  Chest pain or palpitations:  How is your  breathing?: *** SOB: Able to complete all ADLs. Activity level ***  Weight at home pounds. Takes furosemide/torsemide/bumex *** mg *** daily.  LEE PND/Orthopnea  Appetite *** Low-salt diet:   Physical Exam Cost/affordability of meds   HF Medications: Carvedilol 3.125 mg BID Entresto 97-103 mg BID Spironolactone 25 mg daily Farxiga 10 mg daily Furosemide 40 mg PRN for 3 pound weight gain  Has the patient been experiencing any side effects to the medications prescribed?  {YES NO:22349}  Does the patient have any problems obtaining medications due to transportation or finances?   {YES NO:22349}  Understanding of regimen: {excellent/good/fair/poor:19665} Understanding of indications: {excellent/good/fair/poor:19665} Potential of compliance: {excellent/good/fair/poor:19665} Patient understands to avoid NSAIDs. Patient understands to avoid decongestants.    Pertinent Lab Values (10/25/20) . Serum creatinine 0.70, BUN 19, Potassium 4.3, Sodium 135   Vital Signs: . Weight: *** (last clinic weight: ***) . Blood pressure: ***  . Heart rate: ***   Assessment/Plan: 1. Chronic Systolic Heart Failure - Echo 10/12/20 showed EF 20-25% with global hypokinesis, mild LVH, grade I DD, RVSP 54, small pericardial effusion and IVC dilated with RAP 15. CT showed small pericardial effusion, reflux in hepatic veins with moderate opacities in both lungs, dilated main pulmonary artery. NICM/ICM. Had PCI to LAD + HTN. - NYHA II, although not physically active. Euvolemic today. - Continue furosemide 40 mg PRN for 3 pound weight gain - Continue carvedilol 3.125 mg BID - Continue Entresto to 97/103 mg BID. - Continue spironolactone 25 mg daily. - Continue Farxiga 10 mg daily. - Follow up with [facility] on 12/07/20.  2. CAD - S/P PCI LAD. - On Aspirin, rosuvastatin,  and clopidogrel.  - No CP.  3. Uncontrolled T2DM - A1c 13.5% on 10/04/20 (previously 9.6% on 03/15/20) - Per PCP -  Continue farxiga for HF/DM.  4. Hypertension - Continue Entresto.  5. Obesity - Body mass index is 36.69 kg/m. - Portion control and low-carb diet options were previously discussed.  6. Hyperkalemia - Resolved. Stopped KCl supp. - BMET today.  7.Hidradenitis - Doxycycline was stopped during 10/19/20 visit  8. Tobacco use - Down from 1 ppd to 3-4 cigarettes/day. Nicotine patch. - Previously encouraged to quit.   Audry Riles, PharmD, BCPS, BCCP, CPP Heart Failure Clinic Pharmacist [phone]

## 2020-11-15 ENCOUNTER — Ambulatory Visit (HOSPITAL_COMMUNITY)
Admission: RE | Admit: 2020-11-15 | Discharge: 2020-11-15 | Disposition: A | Discharge status: Home / Self Care | Payer: No Typology Code available for payment source | Source: Ambulatory Visit | Attending: Internal Medicine | Admitting: Internal Medicine

## 2020-11-15 ENCOUNTER — Other Ambulatory Visit: Payer: Self-pay

## 2020-11-15 ENCOUNTER — Other Ambulatory Visit (HOSPITAL_COMMUNITY): Payer: Self-pay | Admitting: Internal Medicine

## 2020-11-15 VITALS — BP 154/94 | HR 90 | Wt 227.6 lb

## 2020-11-15 DIAGNOSIS — E875 Hyperkalemia: Secondary | ICD-10-CM | POA: Diagnosis not present

## 2020-11-15 DIAGNOSIS — I5022 Chronic systolic (congestive) heart failure: Secondary | ICD-10-CM | POA: Insufficient documentation

## 2020-11-15 DIAGNOSIS — L732 Hidradenitis suppurativa: Secondary | ICD-10-CM | POA: Insufficient documentation

## 2020-11-15 DIAGNOSIS — Z6836 Body mass index (BMI) 36.0-36.9, adult: Secondary | ICD-10-CM | POA: Diagnosis not present

## 2020-11-15 DIAGNOSIS — I2510 Atherosclerotic heart disease of native coronary artery without angina pectoris: Secondary | ICD-10-CM | POA: Insufficient documentation

## 2020-11-15 DIAGNOSIS — F17210 Nicotine dependence, cigarettes, uncomplicated: Secondary | ICD-10-CM | POA: Insufficient documentation

## 2020-11-15 DIAGNOSIS — Z713 Dietary counseling and surveillance: Secondary | ICD-10-CM | POA: Diagnosis not present

## 2020-11-15 DIAGNOSIS — I110 Hypertensive heart disease with heart failure: Secondary | ICD-10-CM | POA: Diagnosis present

## 2020-11-15 DIAGNOSIS — Z9861 Coronary angioplasty status: Secondary | ICD-10-CM | POA: Diagnosis not present

## 2020-11-15 DIAGNOSIS — E669 Obesity, unspecified: Secondary | ICD-10-CM | POA: Diagnosis not present

## 2020-11-15 DIAGNOSIS — E119 Type 2 diabetes mellitus without complications: Secondary | ICD-10-CM | POA: Diagnosis not present

## 2020-11-15 MED ORDER — CARVEDILOL 6.25 MG PO TABS: 6.2500 mg | ORAL_TABLET | Freq: Two times a day (BID) | ORAL | 11 refills | Status: DC

## 2020-11-15 MED FILL — CARVEDILOL 6.25 MG TABLET: 6.25 | 30 days supply | Qty: 60 | Fill #0

## 2020-11-15 NOTE — Patient Instructions (Signed)
It was a pleasure seeing you today!  MEDICATIONS: -We are changing your medications today -Increase carvedilol to 6.25 mg (1 tablet) twice daily. You may take 2 tablets of the 3.125 mg strength twice daily until you pick up the new strength.  -Call if you have questions about your medications.   NEXT APPOINTMENT: Return to clinic in 3 weeks with [facility].  In general, to take care of your heart failure: -Limit your fluid intake to 2 Liters (half-gallon) per day.   -Limit your salt intake to ideally 2-3 grams (2000-3000 mg) per day. -Weigh yourself daily and record, and bring that "weight diary" to your next appointment.  (Weight gain of 2-3 pounds in 1 day typically means fluid weight.) -The medications for your heart are to help your heart and help you live longer.   -Please contact us before stopping any of your heart medications.  Call the clinic at [phone] with questions or to reschedule future appointments.

## 2020-11-27 MED FILL — CARVEDILOL 6.25 MG TABLET: 6.25 | 30 days supply | Qty: 60 | Fill #0

## 2020-11-27 MED FILL — FREESTYLE LIBRE 2 SENSOR MI: 28 days supply | Qty: 2 | Fill #2

## 2020-12-04 MED FILL — ENTRESTO 97 MG-103 MG TAB: 97-103 | 30 days supply | Qty: 60 | Fill #1

## 2020-12-05 NOTE — Progress Notes (Signed)
Advanced Heart Failure Clinic Note   PCP: [name], NP HF Cardiologist: Dr. Haroldine Laws  Reason for visit/CC: Follow up for HF  HPI: Catherine Espinoza is a 45 y.o. F PMH: T2DM, HTN, morbid obesity BMI 40.  She presented to ED on 10/11/20 for leg swelling and pain, recently off all meds due to cost.  Was being evaluated outpatient had a negative doppler for DVT and started on doxycycline for possible cellulitis and lasix 20 mg PRN to help with swelling. CT in ED showed no PE but small pericardial effusion/thickening, reflux of CTX into hepatic veins with moderate opacities in both lungs, dilated main pulmonary artery with R axillary lymphadenopathy. Started on IV lasix and doxycycline continued for axillary abscess. Cardiology recommended continue lasix IV and echo showed severe LV dysfunction. R/LHC completed; patient had PCI to LAD, preserved CO, elevated filling pressures. She was diuresed and started on GDMT. Her discharge weight was 220 lbs.  Today he returns for HF follow up. Last visit (1/22) he had stable NYHA II symptoms & Entresto & beta blocker were increased. Overall feeling fine. She is back to working FT and denies increasing SOB, CP, dizziness, edema, or PND/Orthopnea. Appetite ok. No fever or chills. Weight at home 230-235 pounds. Taking all medications. Smoking 3-4 cigarettes/day. 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 Has not had to take any prn lasix.  Cardiac Studies  Echo 1/22: EF 20-25% with global hypokinesis, mild LVH, grade I DD, RVSP 54, small pericardial effusion and IVC dilated w/ RAP 15.  R/LHC 1/22: RHC/LHC: PCI to LAD  Prox RCA lesion is 30% stenosed.  RPDA lesion is 95% stenosed.  RPAV lesion is 99% stenosed.  Prox LAD to Mid LAD lesion is 40% stenosed.  Mid LAD lesion is 80% stenosed.  2nd Diag lesion is 50% stenosed.  Findings: Ao = 147/97 (119) LV = 140/27 RA =  12 RV = 68/15 PA = 72/37 (51) PCW = 34 Fick cardiac output/index = 6.3/2.9 PVR = 2.6 WU FA sat = 97% PA sat = 72%, 72%  Assessment: 1. 2v CAD with high grade lesions in the mid LAD and distal RCA 2. 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 Markedly elevated filling pressures with normal cardiac output  Plan/Discussion: Plan PCI of LAD followed by aggressive diuresis and titration of GDMT  ROS: All systems reviewed and negative except as per HPI.   Past Medical History:  Diagnosis Date   Anemia, unspecified    CAD (coronary artery disease)    Chronic left-sided thoracic back pain    Excessive or frequent menstruation    Morbid obesity ([place])    Obstructive sleep apnea 02/11/11   Sleep study: severe OSA- rec CPAP 20cm small  full face mask   Proteinuria    Thoracic radiculopathy    Type II or unspecified type diabetes mellitus without mention of complication, not stated as uncontrolled    Unspecified essential hypertension     Current Outpatient Medications  Medication Sig Dispense Refill   aspirin 81 MG chewable tablet Chew 1 tablet (81 mg total) by mouth daily. 30 tablet 3   BAYER MICROLET LANCETS lancets Use as instructed to check blood sugar three times daily.  DX  E11.65 (Patient taking differently: Use as instructed to check blood sugar three times daily.  DX  E11.65) 100 each 5   blood glucose meter kit and supplies KIT Dispense based on  patient and insurance preference. Use up to four times daily as directed. (FOR ICD-9 250.00, 250.01). 1 each 0   carvedilol (COREG) 6.25 MG tablet Take 1 tablet (6.25 mg total) by mouth 2 (two) times daily with a meal. 60 tablet 11   clopidogrel (PLAVIX) 75 MG tablet Take 1 tablet (75 mg total) by mouth daily. 30 tablet 3   Continuous Blood Gluc Receiver (FREESTYLE LIBRE 2 READER) DEVI Use as directed 1 each 0   Continuous Blood Gluc Sensor (FREESTYLE LIBRE 2 SENSOR) MISC Use as directed 2 each 12   Continuous  Blood Gluc Sensor (FREESTYLE LIBRE SENSOR SYSTEM) MISC Apply every 14 days. 1 each 0   dapagliflozin propanediol (FARXIGA) 10 MG TABS tablet Take 1 tablet (10 mg total) by mouth daily. 30 tablet 3   furosemide (LASIX) 40 MG tablet Take 1 tablet (40 mg total) by mouth daily as needed for fluid or edema. For every 3 lb weight gain 30 tablet 3   glucose blood (BAYER CONTOUR TEST) test strip Use to check blood sugar 3 times per day.  DX  E11.65 (Patient taking differently: Use to check blood sugar 3 times per day.  DX  E11.65) 100 each 5   insulin degludec (TRESIBA) 100 UNIT/ML FlexTouch Pen Inject 35 Units into the skin daily. 15 mL 3   Insulin Syringe 27G X 1/2" 0.5 ML MISC Use as directed 100 each 3   rosuvastatin (CRESTOR) 40 MG tablet Take 1 tablet (40 mg total) by mouth daily. 30 tablet 3   sacubitril-valsartan (ENTRESTO) 97-103 MG Take 1 tablet by mouth 2 (two) times daily. 60 tablet 3   spironolactone (ALDACTONE) 25 MG tablet Take 1 tablet (25 mg total) by mouth daily. 30 tablet 3   valACYclovir (VALTREX) 1000 MG tablet Take 1 tablet by mouth once daily for 5 days at start of rash 15 tablet 1   No current facility-administered medications for this encounter.    Allergies  Allergen Reactions   Ibuprofen     REACTION: knots in mouth with SOB   Labetalol Nausea Only   Aspirin     Knots in mouth     Social History   Socioeconomic History   Marital status: Single    Spouse name: Not on file   Number of children: 1   Years of education: Not on file   Highest education level: Associate degree: occupational, Hotel manager, or vocational program  Occupational History   Occupation: cna/med Engineer, production: [employer]  Tobacco Use   Smoking status: Current Every Day Smoker    Packs/day: 0.50    Years: 10.00    Pack years: 5.00    Types: Cigarettes   Smokeless tobacco: Never Used  Vaping Use   Vaping Use: Never used  Substance and Sexual Activity   Alcohol  use: Yes    Alcohol/week: 0.0 standard drinks    Comment: occasional use   Drug use: No   Sexual activity: Yes    Birth control/protection: Surgical  Other Topics Concern   Not on file  Social History Narrative   Married-filing for divorced   Daughter born 2003   Works as cna/med Firefighter Determinants of Health   Financial Resource Strain: High Risk   Difficulty of Paying Living Expenses: Hard  Food Insecurity: No Food Insecurity   Worried About Charity fundraiser in the Last Year: Never true   Arboriculturist in the Last Year: Never true  Transportation Needs: No Data processing manager (Medical): No   Lack of Transportation (Non-Medical): No  Physical Activity: Not on file  Stress: Not on file  Social Connections: Not on file  Intimate Partner Violence: Not At Risk   Fear of Current or Ex-Partner: No   Emotionally Abused: No   Physically Abused: No   Sexually Abused: No   Family History  Problem Relation Age of Onset   Heart attack Maternal Aunt    Diabetes Mother    Cancer Father        oral cancer   Vitals:   12/07/20 1059  BP: (!) 146/98  Pulse: 82  SpO2: 98%  Weight: 107.5 kg (237 lb)   Wt Readings from Last 3 Encounters:  12/07/20 107.5 kg (237 lb)  11/15/20 103.2 kg (227 lb 9.6 oz)  10/25/20 97.9 kg (215 lb 12.8 oz)   PHYSICAL EXAM: General:  NAD. No resp difficulty HEENT: Normal Neck: Supple. JVP 6-7. Carotids 2+ bilat; no bruits. No lymphadenopathy or thryomegaly appreciated. Cor: PMI nondisplaced. Regular rate & rhythm. No rubs, gallops or murmurs. Lungs: Clear Abdomen: Obese, soft, nontender, nondistended. No hepatosplenomegaly. No bruits or masses. Good bowel sounds. Extremities: No cyanosis, clubbing, rash, edema Neuro: Alert & oriented x 3, cranial nerves grossly intact. Moves all 4 extremities w/o difficulty. Affect pleasant.  Reds: 44%  ASSESSMENT & PLAN: 1. Chronic Systolic Heart Failure -  Echo this admission shows EF 20-25% with global hypokinesis, mild LVH, grade I DD, RVSP 54, small pericardial effusion and IVC dilated w/ RAP 15. CT showed small pericardial effusion, reflux in hepatic veins with moderate opacities in both lungs, dilated main pulmonary artery. NICM/ICM. Had PCI to LAD + HTN. - NYHA II, although not physically active. Weight is up and volume is up on exam.  - Start taking lasix 40 mg daily + 20 mEq of KCl. - Increase carvedilol to 12.5 mg bid. - Continue spiro 25 mg daily. - Continue Farxiga 10 mg daily. No GU symptoms. - Continue Entresto 97/103 mg bid. - BMET today; repeat 7-10 days. - Instructed her to watch her weight, if she becomes light headed, can back off on daily lasix. - Echo next visit.  2. CAD - S/P PCI LAD. - On Aspirin + crestor + plavix.  - No CP.  3. Uncontrolled T2DM - A1c 13.5% (previously 9.6%) - Per PCP. - Continue Farxiga for HF/DM.  4. Hypertension - Still elevated, but better.  - Increase carvedilol.  5. Obesity - Body mass index is 36.69 kg/m. - Discussed portion control and low-carb diet options.  6.Hidradenitis - Continue with doxycycline.  7. Tobacco use - Down from 1 ppd to 3-4 cigarettes/day. Nicotine patch. - Encouraged to quit.  - Follow up in 2-3 months with Dr. Haroldine Laws with echo.  [place], FNP-BC 12/07/20

## 2020-12-07 ENCOUNTER — Other Ambulatory Visit: Payer: Self-pay

## 2020-12-07 ENCOUNTER — Encounter (HOSPITAL_COMMUNITY): Payer: Self-pay

## 2020-12-07 ENCOUNTER — Other Ambulatory Visit (HOSPITAL_COMMUNITY): Payer: Self-pay | Admitting: Family Medicine

## 2020-12-07 ENCOUNTER — Ambulatory Visit (HOSPITAL_COMMUNITY)
Admission: RE | Admit: 2020-12-07 | Discharge: 2020-12-07 | Disposition: A | Discharge status: Home / Self Care | Payer: No Typology Code available for payment source | Source: Ambulatory Visit | Attending: Family Medicine | Admitting: Family Medicine

## 2020-12-07 VITALS — BP 146/98 | HR 82 | Wt 237.0 lb

## 2020-12-07 DIAGNOSIS — L732 Hidradenitis suppurativa: Secondary | ICD-10-CM | POA: Insufficient documentation

## 2020-12-07 DIAGNOSIS — E1165 Type 2 diabetes mellitus with hyperglycemia: Secondary | ICD-10-CM

## 2020-12-07 DIAGNOSIS — Z7902 Long term (current) use of antithrombotics/antiplatelets: Secondary | ICD-10-CM | POA: Insufficient documentation

## 2020-12-07 DIAGNOSIS — F17210 Nicotine dependence, cigarettes, uncomplicated: Secondary | ICD-10-CM | POA: Diagnosis not present

## 2020-12-07 DIAGNOSIS — Z794 Long term (current) use of insulin: Secondary | ICD-10-CM | POA: Insufficient documentation

## 2020-12-07 DIAGNOSIS — Z79899 Other long term (current) drug therapy: Secondary | ICD-10-CM | POA: Diagnosis not present

## 2020-12-07 DIAGNOSIS — I10 Essential (primary) hypertension: Secondary | ICD-10-CM

## 2020-12-07 DIAGNOSIS — Z955 Presence of coronary angioplasty implant and graft: Secondary | ICD-10-CM | POA: Diagnosis not present

## 2020-12-07 DIAGNOSIS — Z720 Tobacco use: Secondary | ICD-10-CM

## 2020-12-07 DIAGNOSIS — Z7982 Long term (current) use of aspirin: Secondary | ICD-10-CM | POA: Diagnosis not present

## 2020-12-07 DIAGNOSIS — I110 Hypertensive heart disease with heart failure: Secondary | ICD-10-CM | POA: Diagnosis not present

## 2020-12-07 DIAGNOSIS — I313 Pericardial effusion (noninflammatory): Secondary | ICD-10-CM | POA: Diagnosis not present

## 2020-12-07 DIAGNOSIS — Z8249 Family history of ischemic heart disease and other diseases of the circulatory system: Secondary | ICD-10-CM | POA: Insufficient documentation

## 2020-12-07 DIAGNOSIS — I5022 Chronic systolic (congestive) heart failure: Secondary | ICD-10-CM | POA: Insufficient documentation

## 2020-12-07 DIAGNOSIS — Z6841 Body Mass Index (BMI) 40.0 and over, adult: Secondary | ICD-10-CM | POA: Diagnosis not present

## 2020-12-07 DIAGNOSIS — E119 Type 2 diabetes mellitus without complications: Secondary | ICD-10-CM | POA: Insufficient documentation

## 2020-12-07 DIAGNOSIS — I428 Other cardiomyopathies: Secondary | ICD-10-CM | POA: Diagnosis not present

## 2020-12-07 DIAGNOSIS — I2510 Atherosclerotic heart disease of native coronary artery without angina pectoris: Secondary | ICD-10-CM | POA: Diagnosis not present

## 2020-12-07 LAB — BASIC METABOLIC PANEL
Anion gap: 5 (ref 5–15)
BUN: 8 mg/dL (ref 6–20)
CO2: 27 mmol/L (ref 22–32)
Calcium: 8.8 mg/dL — ABNORMAL LOW (ref 8.9–10.3)
Chloride: 107 mmol/L (ref 98–111)
Creatinine, Ser: 0.5 mg/dL (ref 0.44–1.00)
GFR, Estimated: >60 mL/min (ref >60)
Glucose, Bld: 71 mg/dL (ref 70–99)
Potassium: 3.8 mmol/L (ref 3.5–5.1)
Sodium: 139 mmol/L (ref 135–145)

## 2020-12-07 MED ORDER — FUROSEMIDE 40 MG PO TABS: 40.0000 mg | ORAL_TABLET | Freq: Every day | ORAL | 6 refills | Status: DC

## 2020-12-07 MED ORDER — POTASSIUM CHLORIDE CRYS ER 20 MEQ PO TBCR
20.0000 meq | EXTENDED_RELEASE_TABLET | Freq: Every day | ORAL | 6 refills | Status: DC
Start: 2020-12-07 — End: 2020-12-07

## 2020-12-07 MED ORDER — CARVEDILOL 12.5 MG PO TABS: 12.5000 mg | ORAL_TABLET | Freq: Two times a day (BID) | ORAL | 6 refills | Status: DC

## 2020-12-07 MED FILL — FUROSEMIDE 40 MG TAB: 40 | 30 days supply | Qty: 30 | Fill #0

## 2020-12-07 MED FILL — POTASSIUM CHLORIDE CRYS ER: 20 | 30 days supply | Qty: 30 | Fill #0

## 2020-12-07 MED FILL — CARVEDILOL 12.5 MG TABLET: 12.5 | 30 days supply | Qty: 60 | Fill #0

## 2020-12-07 NOTE — Patient Instructions (Addendum)
INCREASE Coreg to 12.5mg  (1 tab) twice a day  START Lasix 40mg  (1 tab) daily  START Potassium 65meq (1 tab) daily  Labs today and repeat in 10 days We will only contact you if something comes back abnormal or we need to make some changes. Otherwise no news is good news!   Your physician has requested that you have an echocardiogram. Echocardiography is a painless test that uses sound waves to create images of your heart. It provides your doctor with information about the size and shape of your heart and how well your heart's chambers and valves are working. This procedure takes approximately one hour. There are no restrictions for this procedure.   Your physician recommends that you schedule a follow-up appointment in: 2-3 months with Dr Haroldine Laws and an echo   Please call office at [phone] option 2 if you have any questions or concerns.    At the [facility], you and your health needs are our priority. As part of our continuing mission to provide you with exceptional heart care, we have created designated Provider Care Teams. These Care Teams include your primary Cardiologist (physician) and Advanced Practice Providers (APPs- Physician Assistants and Nurse Practitioners) who all work together to provide you with the care you need, when you need it.   You may see any of the following providers on your designated Care Team at your next follow up: Marland Kitchen Dr Glori Bickers . Dr Loralie Champagne . Dr Vickki Muff . Darrick Grinder, NP . Lyda Jester, [place] . Audry Riles, PharmD   Please be sure to bring in all your medications bottles to every appointment.

## 2020-12-12 ENCOUNTER — Ambulatory Visit: Payer: No Typology Code available for payment source | Admitting: Family

## 2020-12-14 ENCOUNTER — Ambulatory Visit (HOSPITAL_COMMUNITY)
Admission: RE | Admit: 2020-12-14 | Discharge: 2020-12-14 | Disposition: A | Discharge status: Home / Self Care | Payer: No Typology Code available for payment source | Source: Ambulatory Visit | Attending: Internal Medicine | Admitting: Internal Medicine

## 2020-12-14 ENCOUNTER — Other Ambulatory Visit: Payer: Self-pay

## 2020-12-14 DIAGNOSIS — I5022 Chronic systolic (congestive) heart failure: Secondary | ICD-10-CM | POA: Insufficient documentation

## 2020-12-14 LAB — BASIC METABOLIC PANEL
Anion gap: 5 (ref 5–15)
BUN: 13 mg/dL (ref 6–20)
CO2: 30 mmol/L (ref 22–32)
Calcium: 8.7 mg/dL — ABNORMAL LOW (ref 8.9–10.3)
Chloride: 102 mmol/L (ref 98–111)
Creatinine, Ser: 0.8 mg/dL (ref 0.44–1.00)
GFR, Estimated: >60 mL/min (ref >60)
Glucose, Bld: 190 mg/dL — ABNORMAL HIGH (ref 70–99)
Potassium: 4 mmol/L (ref 3.5–5.1)
Sodium: 137 mmol/L (ref 135–145)

## 2020-12-19 ENCOUNTER — Other Ambulatory Visit: Payer: Self-pay | Admitting: Family

## 2020-12-19 MED ORDER — TRESIBA FLEXTOUCH 100 UNIT/ML ~~LOC~~ SOPN: PEN_INJECTOR | SUBCUTANEOUS | 3 refills | Status: DC

## 2020-12-19 MED FILL — TRESIBA FLEXTOUCH 100 UNITS: 100 | 30 days supply | Qty: 15 | Fill #0

## 2020-12-19 MED FILL — FREESTYLE LIBRE 2 SENSOR MI: 28 days supply | Qty: 2 | Fill #3

## 2021-01-08 ENCOUNTER — Other Ambulatory Visit (HOSPITAL_BASED_OUTPATIENT_CLINIC_OR_DEPARTMENT_OTHER): Payer: Self-pay

## 2021-01-08 MED FILL — Sacubitril-Valsartan Tab 97-103 MG: ORAL | 30 days supply | Qty: 60 | Fill #0 | Status: AC

## 2021-01-09 ENCOUNTER — Other Ambulatory Visit (HOSPITAL_BASED_OUTPATIENT_CLINIC_OR_DEPARTMENT_OTHER): Payer: Self-pay

## 2021-01-09 MED FILL — Spironolactone Tab 25 MG: ORAL | 30 days supply | Qty: 30 | Fill #0 | Status: AC

## 2021-01-09 MED FILL — Dapagliflozin Propanediol Tab 10 MG (Base Equivalent): ORAL | 30 days supply | Qty: 30 | Fill #0 | Status: AC

## 2021-01-09 MED FILL — Furosemide Tab 40 MG: ORAL | 30 days supply | Qty: 30 | Fill #0 | Status: AC

## 2021-01-09 MED FILL — Clopidogrel Bisulfate Tab 75 MG (Base Equiv): ORAL | 30 days supply | Qty: 30 | Fill #0 | Status: AC

## 2021-01-09 MED FILL — Rosuvastatin Calcium Tab 40 MG: ORAL | 30 days supply | Qty: 30 | Fill #0 | Status: AC

## 2021-01-09 MED FILL — Carvedilol Tab 12.5 MG: ORAL | 30 days supply | Qty: 60 | Fill #0 | Status: AC

## 2021-01-18 ENCOUNTER — Other Ambulatory Visit (HOSPITAL_BASED_OUTPATIENT_CLINIC_OR_DEPARTMENT_OTHER): Payer: Self-pay

## 2021-01-18 MED FILL — Continuous Glucose System Sensor: 28 days supply | Qty: 2 | Fill #0 | Status: CN

## 2021-01-25 ENCOUNTER — Other Ambulatory Visit (HOSPITAL_BASED_OUTPATIENT_CLINIC_OR_DEPARTMENT_OTHER): Payer: Self-pay

## 2021-02-16 ENCOUNTER — Other Ambulatory Visit (HOSPITAL_BASED_OUTPATIENT_CLINIC_OR_DEPARTMENT_OTHER): Payer: Self-pay

## 2021-02-16 MED FILL — Sacubitril-Valsartan Tab 97-103 MG: ORAL | 30 days supply | Qty: 60 | Fill #1 | Status: AC

## 2021-02-19 ENCOUNTER — Institutional Professional Consult (permissible substitution): Payer: No Typology Code available for payment source | Admitting: Pulmonary Disease

## 2021-03-05 ENCOUNTER — Other Ambulatory Visit (HOSPITAL_BASED_OUTPATIENT_CLINIC_OR_DEPARTMENT_OTHER): Payer: Self-pay

## 2021-03-05 ENCOUNTER — Other Ambulatory Visit: Payer: Self-pay

## 2021-03-05 MED FILL — Carvedilol Tab 12.5 MG: ORAL | 30 days supply | Qty: 60 | Fill #1 | Status: AC

## 2021-03-05 MED FILL — Continuous Glucose System Sensor: 28 days supply | Qty: 2 | Fill #0 | Status: AC

## 2021-03-05 MED FILL — Insulin Degludec Soln Pen-Injector 100 Unit/ML: SUBCUTANEOUS | 30 days supply | Qty: 15 | Fill #0 | Status: AC

## 2021-03-05 MED FILL — Furosemide Tab 40 MG: ORAL | 30 days supply | Qty: 30 | Fill #1 | Status: AC

## 2021-03-06 ENCOUNTER — Encounter (HOSPITAL_COMMUNITY): Payer: Self-pay

## 2021-03-06 ENCOUNTER — Other Ambulatory Visit (HOSPITAL_COMMUNITY): Payer: Self-pay | Admitting: *Deleted

## 2021-03-06 ENCOUNTER — Other Ambulatory Visit (HOSPITAL_BASED_OUTPATIENT_CLINIC_OR_DEPARTMENT_OTHER): Payer: Self-pay

## 2021-03-06 MED ORDER — ROSUVASTATIN CALCIUM 40 MG PO TABS
ORAL_TABLET | Freq: Every day | ORAL | 2 refills | Status: DC
  Filled 2021-03-06: qty 30, 30d supply, fill #0
  Filled 2021-05-15: qty 30, 30d supply, fill #1
  Filled 2021-07-15: qty 30, 30d supply, fill #2

## 2021-03-06 MED ORDER — CLOPIDOGREL BISULFATE 75 MG PO TABS
ORAL_TABLET | Freq: Every day | ORAL | 2 refills | Status: DC
  Filled 2021-03-06: qty 30, 30d supply, fill #0
  Filled 2021-05-15: qty 30, 30d supply, fill #1
  Filled 2021-07-15: qty 30, 30d supply, fill #2

## 2021-03-06 MED ORDER — DAPAGLIFLOZIN PROPANEDIOL 10 MG PO TABS
ORAL_TABLET | Freq: Every day | ORAL | 2 refills | Status: DC
  Filled 2021-03-06: qty 30, 30d supply, fill #0
  Filled 2021-05-15: qty 30, 30d supply, fill #1
  Filled 2021-07-15: qty 30, 30d supply, fill #2

## 2021-03-06 MED ORDER — SPIRONOLACTONE 25 MG PO TABS
ORAL_TABLET | Freq: Every day | ORAL | 2 refills | Status: DC
  Filled 2021-03-06: qty 30, 30d supply, fill #0
  Filled 2021-05-15: qty 30, 30d supply, fill #1
  Filled 2021-07-15: qty 30, 30d supply, fill #2

## 2021-03-07 ENCOUNTER — Other Ambulatory Visit (HOSPITAL_BASED_OUTPATIENT_CLINIC_OR_DEPARTMENT_OTHER): Payer: Self-pay

## 2021-03-08 ENCOUNTER — Other Ambulatory Visit (HOSPITAL_BASED_OUTPATIENT_CLINIC_OR_DEPARTMENT_OTHER): Payer: Self-pay

## 2021-03-12 ENCOUNTER — Other Ambulatory Visit (HOSPITAL_BASED_OUTPATIENT_CLINIC_OR_DEPARTMENT_OTHER): Payer: Self-pay

## 2021-03-12 ENCOUNTER — Ambulatory Visit (HOSPITAL_COMMUNITY)
Admission: RE | Admit: 2021-03-12 | Discharge: 2021-03-12 | Disposition: A | Discharge status: Home / Self Care | Payer: No Typology Code available for payment source | Source: Ambulatory Visit | Attending: Family | Admitting: Family

## 2021-03-12 ENCOUNTER — Encounter (HOSPITAL_COMMUNITY): Payer: Self-pay | Admitting: Internal Medicine

## 2021-03-12 ENCOUNTER — Ambulatory Visit (HOSPITAL_BASED_OUTPATIENT_CLINIC_OR_DEPARTMENT_OTHER)
Admission: RE | Admit: 2021-03-12 | Discharge: 2021-03-12 | Disposition: A | Discharge status: Home / Self Care | Payer: No Typology Code available for payment source | Source: Ambulatory Visit | Attending: Internal Medicine | Admitting: Internal Medicine

## 2021-03-12 ENCOUNTER — Other Ambulatory Visit: Payer: Self-pay

## 2021-03-12 VITALS — BP 149/89 | HR 84 | Wt 245.8 lb

## 2021-03-12 DIAGNOSIS — Z8249 Family history of ischemic heart disease and other diseases of the circulatory system: Secondary | ICD-10-CM | POA: Insufficient documentation

## 2021-03-12 DIAGNOSIS — I2510 Atherosclerotic heart disease of native coronary artery without angina pectoris: Secondary | ICD-10-CM

## 2021-03-12 DIAGNOSIS — Z720 Tobacco use: Secondary | ICD-10-CM | POA: Diagnosis not present

## 2021-03-12 DIAGNOSIS — F17210 Nicotine dependence, cigarettes, uncomplicated: Secondary | ICD-10-CM | POA: Diagnosis not present

## 2021-03-12 DIAGNOSIS — Z79899 Other long term (current) drug therapy: Secondary | ICD-10-CM | POA: Diagnosis not present

## 2021-03-12 DIAGNOSIS — Z6836 Body mass index (BMI) 36.0-36.9, adult: Secondary | ICD-10-CM | POA: Insufficient documentation

## 2021-03-12 DIAGNOSIS — I313 Pericardial effusion (noninflammatory): Secondary | ICD-10-CM | POA: Diagnosis not present

## 2021-03-12 DIAGNOSIS — I5022 Chronic systolic (congestive) heart failure: Secondary | ICD-10-CM

## 2021-03-12 DIAGNOSIS — I255 Ischemic cardiomyopathy: Secondary | ICD-10-CM | POA: Insufficient documentation

## 2021-03-12 DIAGNOSIS — Z7984 Long term (current) use of oral hypoglycemic drugs: Secondary | ICD-10-CM | POA: Insufficient documentation

## 2021-03-12 DIAGNOSIS — I10 Essential (primary) hypertension: Secondary | ICD-10-CM

## 2021-03-12 DIAGNOSIS — G4733 Obstructive sleep apnea (adult) (pediatric): Secondary | ICD-10-CM | POA: Insufficient documentation

## 2021-03-12 DIAGNOSIS — E1165 Type 2 diabetes mellitus with hyperglycemia: Secondary | ICD-10-CM | POA: Insufficient documentation

## 2021-03-12 DIAGNOSIS — Z7902 Long term (current) use of antithrombotics/antiplatelets: Secondary | ICD-10-CM | POA: Insufficient documentation

## 2021-03-12 DIAGNOSIS — I428 Other cardiomyopathies: Secondary | ICD-10-CM | POA: Insufficient documentation

## 2021-03-12 DIAGNOSIS — Z794 Long term (current) use of insulin: Secondary | ICD-10-CM | POA: Diagnosis not present

## 2021-03-12 DIAGNOSIS — Z955 Presence of coronary angioplasty implant and graft: Secondary | ICD-10-CM | POA: Insufficient documentation

## 2021-03-12 DIAGNOSIS — I110 Hypertensive heart disease with heart failure: Secondary | ICD-10-CM | POA: Diagnosis not present

## 2021-03-12 DIAGNOSIS — Z7982 Long term (current) use of aspirin: Secondary | ICD-10-CM | POA: Diagnosis not present

## 2021-03-12 LAB — BASIC METABOLIC PANEL
Anion gap: 7 (ref 5–15)
BUN: 11 mg/dL (ref 6–20)
CO2: 27 mmol/L (ref 22–32)
Calcium: 9 mg/dL (ref 8.9–10.3)
Chloride: 103 mmol/L (ref 98–111)
Creatinine, Ser: 0.65 mg/dL (ref 0.44–1.00)
GFR, Estimated: >60 mL/min (ref >60)
Glucose, Bld: 85 mg/dL (ref 70–99)
Potassium: 4.2 mmol/L (ref 3.5–5.1)
Sodium: 137 mmol/L (ref 135–145)

## 2021-03-12 LAB — ECHOCARDIOGRAM COMPLETE
Area-P 1/2: 4.06 cm2
P 1/2 time: 689 msec
S' Lateral: 4.4 cm

## 2021-03-12 LAB — BRAIN NATRIURETIC PEPTIDE: B Natriuretic Peptide: 255.6 pg/mL — ABNORMAL HIGH (ref 0.0–100.0)

## 2021-03-12 MED ORDER — CARVEDILOL 12.5 MG PO TABS
18.7500 mg | ORAL_TABLET | Freq: Two times a day (BID) | ORAL | 11 refills | Status: DC
  Filled 2021-03-12 – 2021-05-15 (×3): qty 60, 20d supply, fill #0
  Filled 2021-07-15: qty 60, 20d supply, fill #1
  Filled 2021-08-19: qty 60, 20d supply, fill #2
  Filled 2021-10-13: qty 60, 20d supply, fill #3
  Filled 2021-12-24: qty 60, 20d supply, fill #4

## 2021-03-12 NOTE — Patient Instructions (Signed)
Increase Carvedilol to 18.75 mg (1 & 1/2 tabs) Twice daily   Labs done today, your results will be available in MyChart, we will contact you for abnormal readings.  You have been referred to the Hypertension Clinic, they will call you to schedule this  Please call our office in February 2023 to schedule your follow up appointment.  If you have any questions or concerns before your next appointment please send [place] a message through [place] or call our office at [phone].    TO LEAVE A MESSAGE FOR THE NURSE SELECT OPTION 2, PLEASE LEAVE A MESSAGE INCLUDING: YOUR NAME DATE OF BIRTH CALL BACK NUMBER REASON FOR CALL**this is important as we prioritize the call backs  YOU WILL RECEIVE A CALL BACK THE SAME DAY AS LONG AS YOU CALL BEFORE 4:00 PM  At the [facility], you and your health needs are our priority. As part of our continuing mission to provide you with exceptional heart care, we have created designated Provider Care Teams. These Care Teams include your primary Cardiologist (physician) and Advanced Practice Providers (APPs- Physician Assistants and Nurse Practitioners) who all work together to provide you with the care you need, when you need it.   You may see any of the following providers on your designated Care Team at your next follow up: Dr Glori Bickers Dr Loralie Champagne Dr Patrice Paradise, NP Lyda Jester, [place] Ginnie Smart Audry Riles, PharmD   Please be sure to bring in all your medications bottles to every appointment.

## 2021-03-12 NOTE — Progress Notes (Signed)
Advanced Heart Failure Clinic Note   PCP: [name], NP HF Cardiologist: Dr. Haroldine Laws  Reason for visit/CC: Follow up for HF  HPI: Catherine Espinoza is a 45 yo female with T2DM, HTN, morbid obesity BMI 40, CAD, OSA and systolic HF  Presented to ED on 10/11/20 for leg swelling. Echo showed EF 20-25%. R/LHC completed; patient had PCI to LAD, preserved CO, elevated filling pressures. She was diuresed and started on GDMT. Her discharge weight was 220 lbs.  Here with her mother. Feeling better. Working FT at [employer]. Gets tired with 12 hours days. No CP. Mild DOE. No edema, orthopnea or PND. Taking all meds as prescribed. Not checking BP at home or work. Still smoking about 1/2 ppd. Has not gotten sleep study yet. Mother said she doesn't snore as much any more.   Echo today 03/12/21 EF 40-45% Personally reviewed   Cardiac Studies  Echo 1/22: EF 20-25% with global hypokinesis, mild LVH, grade I DD, RVSP 54, small pericardial effusion and IVC dilated w/ RAP 15.  R/LHC 1/22: RHC/LHC: PCI to LAD Prox RCA lesion is 30% stenosed. RPDA lesion is 95% stenosed. RPAV lesion is 99% stenosed. Prox LAD to Mid LAD lesion is 40% stenosed. Mid LAD lesion is 80% stenosed. 2nd Diag lesion is 50% stenosed.   Findings:  Ao = 147/97 (119) LV =  140/27 RA =  12 RV = 68/15 PA = 72/37 (51) PCW = 34 Fick cardiac output/index = 6.3/2.9 PVR = 2.6 WU FA sat = 97% PA sat = 72%, 72%  Assessment: 1. 2v CAD with high grade lesions in the mid LAD and distal RCA 2. 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 Markedly elevated filling pressures with normal cardiac output  Plan/Discussion: Plan PCI of LAD followed by aggressive diuresis and titration of GDMT  ROS: All systems reviewed and negative except as per HPI.   Past Medical History:  Diagnosis Date   Anemia, unspecified    CAD (coronary artery disease)    Chronic left-sided thoracic back pain    Excessive or frequent  menstruation    Morbid obesity ([place])    Obstructive sleep apnea 02/11/11   Sleep study: severe OSA- rec CPAP 20cm small  full face mask   Proteinuria    Thoracic radiculopathy    Type II or unspecified type diabetes mellitus without mention of complication, not stated as uncontrolled    Unspecified essential hypertension     Current Outpatient Medications  Medication Sig Dispense Refill   aspirin 81 MG chewable tablet CHEW 1 TABLET (81 MG TOTAL) BY MOUTH DAILY. 30 tablet 2   BAYER MICROLET LANCETS lancets Use as instructed to check blood sugar three times daily.  DX  E11.65 (Patient taking differently: Use as instructed to check blood sugar three times daily.  DX  E11.65) 100 each 5   blood glucose meter kit and supplies KIT Dispense based on patient and insurance preference. Use up to four times daily as directed. (FOR ICD-9 250.00, 250.01). 1 each 0   Blood Glucose Monitoring Suppl (FREESTYLE LITE) w/Device KIT USE UP TO 4 TIMES DAILY 1 kit 0   carvedilol (COREG) 12.5 MG tablet TAKE 1 TABLET (12.5 MG TOTAL) BY MOUTH 2 (TWO) TIMES DAILY WITH A MEAL. 60 tablet 6   clopidogrel (PLAVIX) 75 MG tablet TAKE 1 TABLET (75 MG TOTAL) BY MOUTH DAILY. 30 tablet 2   Continuous Blood Gluc Receiver (FREESTYLE LIBRE 2 READER) DEVI USE AS DIRECTED 1 each  0   Continuous Blood Gluc Sensor (FREESTYLE LIBRE SENSOR SYSTEM) MISC APPLY SENSOR EVERY 14 DAYS 2 each 0   dapagliflozin propanediol (FARXIGA) 10 MG TABS tablet TAKE 1 TABLET (10 MG TOTAL) BY MOUTH DAILY. 30 tablet 2   furosemide (LASIX) 40 MG tablet TAKE 1 TABLET (40 MG TOTAL) BY MOUTH DAILY. FOR EVERY 3 LB WEIGHT GAIN 30 tablet 6   glucose blood test strip USE UP TO 4 TIMES DAILY 100 strip 0   insulin degludec (TRESIBA) 100 UNIT/ML FlexTouch Pen INJECT UP TO 50 UNITS UNDER THE SKIN DAILY 15 mL 3   Insulin Pen Needle 32G X 4 MM MISC USE AS DIRECTED WITH INSULIN 100 each 0   Insulin Pen Needle 32G X 4 MM MISC USE AS DIRECTED ONCE DAILY 100 each 0   Insulin  Syringe 27G X 1/2" 0.5 ML MISC Use as directed 100 each 3   Lancets (FREESTYLE) lancets USE UP TO FOUR TIMES DAILY AS DIRECTED 100 each 0   potassium chloride SA (KLOR-CON) 20 MEQ tablet TAKE 1 TABLET (20 MEQ TOTAL) BY MOUTH DAILY. 30 tablet 6   rosuvastatin (CRESTOR) 40 MG tablet TAKE 1 TABLET (40 MG TOTAL) BY MOUTH DAILY. 30 tablet 2   sacubitril-valsartan (ENTRESTO) 97-103 MG TAKE 1 TABLET BY MOUTH 2 (TWO) TIMES DAILY. 60 tablet 3   spironolactone (ALDACTONE) 25 MG tablet TAKE 1 TABLET (25 MG TOTAL) BY MOUTH DAILY. 30 tablet 2   No current facility-administered medications for this encounter.    Allergies  Allergen Reactions   Ibuprofen     REACTION: knots in mouth with SOB   Labetalol Nausea Only   Aspirin     Knots in mouth     Social History   Socioeconomic History   Marital status: Single    Spouse name: Not on file   Number of children: 1   Years of education: Not on file   Highest education level: Associate degree: occupational, Hotel manager, or vocational program  Occupational History   Occupation: cna/med Engineer, production: [employer]  Tobacco Use   Smoking status: Every Day    Packs/day: 0.50    Years: 10.00    Pack years: 5.00    Types: Cigarettes   Smokeless tobacco: Never  Vaping Use   Vaping Use: Never used  Substance and Sexual Activity   Alcohol use: Yes    Alcohol/week: 0.0 standard drinks    Comment: occasional use   Drug use: No   Sexual activity: Yes    Birth control/protection: Surgical  Other Topics Concern   Not on file  Social History Narrative   Married-filing for divorced   Daughter born 2003   Works as cna/med Firefighter Determinants of Health   Financial Resource Strain: High Risk   Difficulty of Paying Living Expenses: Hard  Food Insecurity: No Food Insecurity   Worried About Charity fundraiser in the Last Year: Never true   Arboriculturist in the Last Year: Never true  Transportation Needs: No Transportation Needs    Lack of Transportation (Medical): No   Lack of Transportation (Non-Medical): No  Physical Activity: Not on file  Stress: Not on file  Social Connections: Not on file  Intimate Partner Violence: Not At Risk   Fear of Current or Ex-Partner: No   Emotionally Abused: No   Physically Abused: No   Sexually Abused: No   Family History  Problem Relation Age of Onset  Heart attack Maternal Aunt    Diabetes Mother    Cancer Father        oral cancer   Vitals:   03/12/21 1113  BP: (!) 149/89  Pulse: 84  SpO2: 97%  Weight: 111.5 kg (245 lb 12.8 oz)   Wt Readings from Last 3 Encounters:  03/12/21 111.5 kg (245 lb 12.8 oz)  12/07/20 107.5 kg (237 lb)  11/15/20 103.2 kg (227 lb 9.6 oz)   PHYSICAL EXAM: General:  Well appearing. No resp difficulty HEENT: normal Neck: supple. no JVD. Carotids 2+ bilat; no bruits. No lymphadenopathy or thryomegaly appreciated. Cor: PMI nondisplaced. Regular rate & rhythm. No rubs, gallops or murmurs. Lungs: clear Abdomen: obese soft, nontender, nondistended. No hepatosplenomegaly. No bruits or masses. Good bowel sounds. Extremities: no cyanosis, clubbing, rash, edema Neuro: alert & orientedx3, cranial nerves grossly intact. moves all 4 extremities w/o difficulty. Affect pleasant  ASSESSMENT & PLAN: 1.  Chronic Systolic Heart Failure - Echo 1/22 EF 20-25% with global hypokinesis, mild LVH, grade I DD, RVSP 54, small pericardial effusion and IVC dilated w/ RAP 15.  - Cath with LAD lesion Had PCI to LAD - CM felt to be mixed iCM and HTN - Echo today 03/12/21 EF 40-45% Personally reviewed - NYHA II - Volume status looks good. Contine lasix 40 mg daily + 20 mEq of KCl. - Increase carvedilol to 18.75 bid - Continue spiro 25 mg daily. - Continue Farxiga 10 mg daily. No GU symptoms. - Continue Entresto 97/103 mg bid. - Labs today    2. CAD - S/P PCI LAD 1/22 - On Aspirin + crestor + plavix. Need DAPT x 1 year - No s/s angina  3. Uncontrolled T2DM -  A1c 13.5% (previously 9.6%) - Per PCP. - Continue Farxiga for HF/DM.   4. Hypertension - Still elevated - Increase carvedilol. - Refer to HTN clinic   5. Obesity - Body mass index is 36.69 kg/m. - Discussed need to lose weight  6. Tobacco use - Down from 1 ppd to 1/2 ppd   7. OSA - PCP working on repeat sleep study  Glori Bickers, MD 03/12/21

## 2021-03-12 NOTE — Progress Notes (Signed)
  Echocardiogram 2D Echocardiogram has been performed.  Catherine Espinoza G Keena Dinse 03/12/2021, 11:05 AM

## 2021-03-30 ENCOUNTER — Other Ambulatory Visit (HOSPITAL_COMMUNITY): Payer: Self-pay | Admitting: *Deleted

## 2021-03-30 ENCOUNTER — Other Ambulatory Visit (HOSPITAL_BASED_OUTPATIENT_CLINIC_OR_DEPARTMENT_OTHER): Payer: Self-pay

## 2021-03-30 ENCOUNTER — Other Ambulatory Visit (HOSPITAL_COMMUNITY): Payer: Self-pay | Admitting: Family Medicine

## 2021-03-30 MED ORDER — ENTRESTO 97-103 MG PO TABS
1.0000 | ORAL_TABLET | Freq: Two times a day (BID) | ORAL | 2 refills | Status: DC
  Filled 2021-03-30: qty 90, 45d supply, fill #0
  Filled 2021-07-05: qty 90, 45d supply, fill #1
  Filled 2021-10-13: qty 90, 45d supply, fill #2

## 2021-04-03 ENCOUNTER — Other Ambulatory Visit (HOSPITAL_BASED_OUTPATIENT_CLINIC_OR_DEPARTMENT_OTHER): Payer: Self-pay

## 2021-04-06 ENCOUNTER — Other Ambulatory Visit (HOSPITAL_BASED_OUTPATIENT_CLINIC_OR_DEPARTMENT_OTHER): Payer: Self-pay

## 2021-04-06 MED FILL — Continuous Glucose System Sensor: 28 days supply | Qty: 2 | Fill #0 | Status: CN

## 2021-04-16 ENCOUNTER — Encounter: Payer: Self-pay | Admitting: Family

## 2021-04-16 ENCOUNTER — Other Ambulatory Visit (HOSPITAL_BASED_OUTPATIENT_CLINIC_OR_DEPARTMENT_OTHER): Payer: Self-pay

## 2021-04-23 ENCOUNTER — Other Ambulatory Visit (HOSPITAL_BASED_OUTPATIENT_CLINIC_OR_DEPARTMENT_OTHER): Payer: Self-pay

## 2021-04-23 ENCOUNTER — Telehealth: Payer: No Typology Code available for payment source | Admitting: Physician Assistant

## 2021-04-23 ENCOUNTER — Encounter: Payer: Self-pay | Admitting: Physician Assistant

## 2021-04-23 DIAGNOSIS — R059 Cough, unspecified: Secondary | ICD-10-CM | POA: Diagnosis not present

## 2021-04-23 DIAGNOSIS — J028 Acute pharyngitis due to other specified organisms: Secondary | ICD-10-CM

## 2021-04-23 DIAGNOSIS — U071 COVID-19: Secondary | ICD-10-CM | POA: Diagnosis not present

## 2021-04-23 DIAGNOSIS — B9689 Other specified bacterial agents as the cause of diseases classified elsewhere: Secondary | ICD-10-CM | POA: Diagnosis not present

## 2021-04-23 MED ORDER — AMOXICILLIN 500 MG PO CAPS
500.0000 mg | ORAL_CAPSULE | Freq: Two times a day (BID) | ORAL | 0 refills | Status: AC
  Filled 2021-04-23: qty 20, 10d supply, fill #0

## 2021-04-23 MED ORDER — LIDOCAINE VISCOUS HCL 2 % MT SOLN
15.0000 mL | OROMUCOSAL | 0 refills | Status: DC | PRN
  Filled 2021-04-23: qty 200, 3d supply, fill #0

## 2021-04-23 MED ORDER — PROMETHAZINE-DM 6.25-15 MG/5ML PO SYRP
5.0000 mL | ORAL_SOLUTION | Freq: Every evening | ORAL | 0 refills | Status: DC | PRN
  Filled 2021-04-23: qty 118, 23d supply, fill #0

## 2021-04-23 MED ORDER — BENZONATATE 100 MG PO CAPS
100.0000 mg | ORAL_CAPSULE | Freq: Three times a day (TID) | ORAL | 0 refills | Status: DC | PRN
  Filled 2021-04-23: qty 30, 10d supply, fill #0

## 2021-04-23 NOTE — Progress Notes (Signed)
Virtual Visit Consent   Catherine Espinoza, you are scheduled for a virtual visit with a [facility] provider today.     Just as with appointments in the office, your consent must be obtained to participate.  Your consent will be active for this visit and any virtual visit you may have with one of our providers in the next 365 days.     If you have a MyChart account, a copy of this consent can be sent to you electronically.  All virtual visits are billed to your insurance company just like a traditional visit in the office.    As this is a virtual visit, video technology does not allow for your provider to perform a traditional examination.  This may limit your provider's ability to fully assess your condition.  If your provider identifies any concerns that need to be evaluated in person or the need to arrange testing (such as labs, EKG, etc.), we will make arrangements to do so.     Although advances in technology are sophisticated, we cannot ensure that it will always work on either your end or our end.  If the connection with a video visit is poor, the visit may have to be switched to a telephone visit.  With either a video or telephone visit, we are not always able to ensure that we have a secure connection.     I need to obtain your verbal consent now.   Are you willing to proceed with your visit today?    Catherine Espinoza has provided verbal consent on 04/23/2021 for a virtual visit (video or telephone).   Mar Daring, PA-C   Date: 04/23/2021 1:08 PM   Virtual Visit via Video Note   IMar Daring, connected with  Catherine Espinoza  ([id number], [birth year]) on 04/23/21 at  1:00 PM EDT by a video-enabled telemedicine application and verified that I am speaking with the correct person using two identifiers.  Location: Patient: Virtual Visit Location Patient: Other: work;isolated Provider: Virtual Visit Location Provider: Home Office   I discussed the limitations of evaluation  and management by telemedicine and the availability of in person appointments. The patient expressed understanding and agreed to proceed.    History of Present Illness: Catherine Espinoza is a 45 y.o. who identifies as a female who was assigned female at birth, and is being seen today for cough and sore throat. Tested positive for covid 19 last week. Lost smell and taste at the end of last week. Cough started near the end of the week. Cough is overall dry. Throat feels like swallowing glass. Reports throat is red. Some sinus congestion. Denies fevers, chills, fatigue.    Problems:  Patient Active Problem List   Diagnosis Date Noted   CAD (coronary artery disease)    Coronary artery disease involving native coronary artery of native heart with unstable angina pectoris ([place])    Acute CHF (congestive heart failure) ([place]) 10/12/2020   CHF, acute ([place]) 10/11/2020   Preventative health care 06/21/2016   GERD (gastroesophageal reflux disease) 12/25/2014   Onychomycosis 08/11/2014   HTN (hypertension) 10/11/2013   Hidradenitis suppurativa 08/02/2011   Fatigue 08/02/2011   Obstructive sleep apnea 02/15/2011   Hypertension 02/15/2011   Hyperlipidemia 01/16/2011   PROTEINURIA 11/16/2009   Diabetes type 2, uncontrolled ([place]) 11/03/2009   ANEMIA 11/03/2009   MORBID OBESITY 10/31/2009   TOBACCO ABUSE 10/31/2009    Allergies:  Allergies  Allergen Reactions   Ibuprofen     REACTION:  knots in mouth with SOB   Labetalol Nausea Only   Aspirin     Knots in mouth    Medications:  Current Outpatient Medications:    amoxicillin (AMOXIL) 500 MG capsule, Take 1 capsule (500 mg total) by mouth 2 (two) times daily for 10 days., Disp: 20 capsule, Rfl: 0   benzonatate (TESSALON) 100 MG capsule, Take 1 capsule (100 mg total) by mouth 3 (three) times daily as needed., Disp: 30 capsule, Rfl: 0   lidocaine (XYLOCAINE) 2 % solution, Use as directed 15 mLs in the mouth or throat every 4 (four) hours as needed for  mouth pain., Disp: 200 mL, Rfl: 0   promethazine-dextromethorphan (PROMETHAZINE-DM) 6.25-15 MG/5ML syrup, Take 5 mLs by mouth at bedtime as needed for cough., Disp: 118 mL, Rfl: 0   aspirin 81 MG chewable tablet, CHEW 1 TABLET (81 MG TOTAL) BY MOUTH DAILY., Disp: 30 tablet, Rfl: 2   BAYER MICROLET LANCETS lancets, Use as instructed to check blood sugar three times daily.  DX  E11.65 (Patient taking differently: Use as instructed to check blood sugar three times daily.  DX  E11.65), Disp: 100 each, Rfl: 5   blood glucose meter kit and supplies KIT, Dispense based on patient and insurance preference. Use up to four times daily as directed. (FOR ICD-9 250.00, 250.01)., Disp: 1 each, Rfl: 0   Blood Glucose Monitoring Suppl (FREESTYLE LITE) w/Device KIT, USE UP TO 4 TIMES DAILY, Disp: 1 kit, Rfl: 0   carvedilol (COREG) 12.5 MG tablet, Take 1 & 1/2 tablets (18.75 mg total) by mouth 2 (two) times daily with a meal., Disp: 60 tablet, Rfl: 11   clopidogrel (PLAVIX) 75 MG tablet, TAKE 1 TABLET (75 MG TOTAL) BY MOUTH DAILY., Disp: 30 tablet, Rfl: 2   Continuous Blood Gluc Receiver (FREESTYLE LIBRE 2 READER) DEVI, USE AS DIRECTED, Disp: 1 each, Rfl: 0   Continuous Blood Gluc Sensor (FREESTYLE LIBRE SENSOR SYSTEM) MISC, APPLY SENSOR EVERY 14 DAYS, Disp: 2 each, Rfl: 0   dapagliflozin propanediol (FARXIGA) 10 MG TABS tablet, TAKE 1 TABLET (10 MG TOTAL) BY MOUTH DAILY., Disp: 30 tablet, Rfl: 2   furosemide (LASIX) 40 MG tablet, TAKE 1 TABLET (40 MG TOTAL) BY MOUTH DAILY. FOR EVERY 3 LB WEIGHT GAIN, Disp: 30 tablet, Rfl: 6   glucose blood test strip, USE UP TO 4 TIMES DAILY, Disp: 100 strip, Rfl: 0   insulin degludec (TRESIBA) 100 UNIT/ML FlexTouch Pen, INJECT UP TO 50 UNITS UNDER THE SKIN DAILY, Disp: 15 mL, Rfl: 3   Insulin Pen Needle 32G X 4 MM MISC, USE AS DIRECTED WITH INSULIN, Disp: 100 each, Rfl: 0   Insulin Pen Needle 32G X 4 MM MISC, USE AS DIRECTED ONCE DAILY, Disp: 100 each, Rfl: 0   Insulin Syringe 27G X  1/2" 0.5 ML MISC, Use as directed, Disp: 100 each, Rfl: 3   Lancets (FREESTYLE) lancets, USE UP TO FOUR TIMES DAILY AS DIRECTED, Disp: 100 each, Rfl: 0   potassium chloride SA (KLOR-CON) 20 MEQ tablet, TAKE 1 TABLET (20 MEQ TOTAL) BY MOUTH DAILY., Disp: 30 tablet, Rfl: 6   rosuvastatin (CRESTOR) 40 MG tablet, TAKE 1 TABLET (40 MG TOTAL) BY MOUTH DAILY., Disp: 30 tablet, Rfl: 2   sacubitril-valsartan (ENTRESTO) 97-103 MG, Take 1 tablet by mouth 2 (two) times daily., Disp: 90 tablet, Rfl: 2   spironolactone (ALDACTONE) 25 MG tablet, TAKE 1 TABLET (25 MG TOTAL) BY MOUTH DAILY., Disp: 30 tablet, Rfl: 2  Observations/Objective: Patient is  well-developed, well-nourished in no acute distress.  Resting comfortably at work.  Head is normocephalic, atraumatic.  No labored breathing.  Dry, barking cough heard x 2; not effecting speech Speech is clear and coherent with logical content.  Patient is alert and oriented at baseline.   Assessment and Plan: 1. Acute bacterial pharyngitis - amoxicillin (AMOXIL) 500 MG capsule; Take 1 capsule (500 mg total) by mouth 2 (two) times daily for 10 days.  Dispense: 20 capsule; Refill: 0 - lidocaine (XYLOCAINE) 2 % solution; Use as directed 15 mLs in the mouth or throat every 4 (four) hours as needed for mouth pain.  Dispense: 200 mL; Refill: 0  2. Cough - benzonatate (TESSALON) 100 MG capsule; Take 1 capsule (100 mg total) by mouth 3 (three) times daily as needed.  Dispense: 30 capsule; Refill: 0 - promethazine-dextromethorphan (PROMETHAZINE-DM) 6.25-15 MG/5ML syrup; Take 5 mLs by mouth at bedtime as needed for cough.  Dispense: 118 mL; Refill: 0  3. COVID-19 - benzonatate (TESSALON) 100 MG capsule; Take 1 capsule (100 mg total) by mouth 3 (three) times daily as needed.  Dispense: 30 capsule; Refill: 0 - promethazine-dextromethorphan (PROMETHAZINE-DM) 6.25-15 MG/5ML syrup; Take 5 mLs by mouth at bedtime as needed for cough.  Dispense: 118 mL; Refill: 0 - Suspect  secondary bacterial pharyngitis following Covid 19 infection - Amoxicillin prescribed as above - Tessalon perles for daytime cough - Promethazine DM for nighttime cough - Salt water gargles as needed - Push fluids - Rest as needed - Seek in person evaluation if not improving or worsening  Follow Up Instructions: I discussed the assessment and treatment plan with the patient. The patient was provided an opportunity to ask questions and all were answered. The patient agreed with the plan and demonstrated an understanding of the instructions.  A copy of instructions were sent to the patient via MyChart.  The patient was advised to call back or seek an in-person evaluation if the symptoms worsen or if the condition fails to improve as anticipated.  Time:  I spent 14 minutes with the patient via telehealth technology discussing the above problems/concerns.    Mar Daring, PA-C

## 2021-04-23 NOTE — Patient Instructions (Addendum)
Catherine Espinoza, thank you for joining Mar Daring, PA-C for today's virtual visit.  While this provider is not your primary care provider (PCP), if your PCP is located in our provider database this encounter information will be shared with them immediately following your visit.  Consent: (Patient) Catherine Espinoza provided verbal consent for this virtual visit at the beginning of the encounter.  Current Medications:  Current Outpatient Medications:    amoxicillin (AMOXIL) 500 MG capsule, Take 1 capsule (500 mg total) by mouth 2 (two) times daily for 10 days., Disp: 20 capsule, Rfl: 0   benzonatate (TESSALON) 100 MG capsule, Take 1 capsule (100 mg total) by mouth 3 (three) times daily as needed., Disp: 30 capsule, Rfl: 0   lidocaine (XYLOCAINE) 2 % solution, Use as directed 15 mLs in the mouth or throat every 4 (four) hours as needed for mouth pain., Disp: 200 mL, Rfl: 0   promethazine-dextromethorphan (PROMETHAZINE-DM) 6.25-15 MG/5ML syrup, Take 5 mLs by mouth at bedtime as needed for cough., Disp: 118 mL, Rfl: 0   aspirin 81 MG chewable tablet, CHEW 1 TABLET (81 MG TOTAL) BY MOUTH DAILY., Disp: 30 tablet, Rfl: 2   BAYER MICROLET LANCETS lancets, Use as instructed to check blood sugar three times daily.  DX  E11.65 (Patient taking differently: Use as instructed to check blood sugar three times daily.  DX  E11.65), Disp: 100 each, Rfl: 5   blood glucose meter kit and supplies KIT, Dispense based on patient and insurance preference. Use up to four times daily as directed. (FOR ICD-9 250.00, 250.01)., Disp: 1 each, Rfl: 0   Blood Glucose Monitoring Suppl (FREESTYLE LITE) w/Device KIT, USE UP TO 4 TIMES DAILY, Disp: 1 kit, Rfl: 0   carvedilol (COREG) 12.5 MG tablet, Take 1 & 1/2 tablets (18.75 mg total) by mouth 2 (two) times daily with a meal., Disp: 60 tablet, Rfl: 11   clopidogrel (PLAVIX) 75 MG tablet, TAKE 1 TABLET (75 MG TOTAL) BY MOUTH DAILY., Disp: 30 tablet, Rfl: 2   Continuous Blood  Gluc Receiver (FREESTYLE LIBRE 2 READER) DEVI, USE AS DIRECTED, Disp: 1 each, Rfl: 0   Continuous Blood Gluc Sensor (FREESTYLE LIBRE SENSOR SYSTEM) MISC, APPLY SENSOR EVERY 14 DAYS, Disp: 2 each, Rfl: 0   dapagliflozin propanediol (FARXIGA) 10 MG TABS tablet, TAKE 1 TABLET (10 MG TOTAL) BY MOUTH DAILY., Disp: 30 tablet, Rfl: 2   furosemide (LASIX) 40 MG tablet, TAKE 1 TABLET (40 MG TOTAL) BY MOUTH DAILY. FOR EVERY 3 LB WEIGHT GAIN, Disp: 30 tablet, Rfl: 6   glucose blood test strip, USE UP TO 4 TIMES DAILY, Disp: 100 strip, Rfl: 0   insulin degludec (TRESIBA) 100 UNIT/ML FlexTouch Pen, INJECT UP TO 50 UNITS UNDER THE SKIN DAILY, Disp: 15 mL, Rfl: 3   Insulin Pen Needle 32G X 4 MM MISC, USE AS DIRECTED WITH INSULIN, Disp: 100 each, Rfl: 0   Insulin Pen Needle 32G X 4 MM MISC, USE AS DIRECTED ONCE DAILY, Disp: 100 each, Rfl: 0   Insulin Syringe 27G X 1/2" 0.5 ML MISC, Use as directed, Disp: 100 each, Rfl: 3   Lancets (FREESTYLE) lancets, USE UP TO FOUR TIMES DAILY AS DIRECTED, Disp: 100 each, Rfl: 0   potassium chloride SA (KLOR-CON) 20 MEQ tablet, TAKE 1 TABLET (20 MEQ TOTAL) BY MOUTH DAILY., Disp: 30 tablet, Rfl: 6   rosuvastatin (CRESTOR) 40 MG tablet, TAKE 1 TABLET (40 MG TOTAL) BY MOUTH DAILY., Disp: 30 tablet, Rfl: 2   sacubitril-valsartan (  ENTRESTO) 97-103 MG, Take 1 tablet by mouth 2 (two) times daily., Disp: 90 tablet, Rfl: 2   spironolactone (ALDACTONE) 25 MG tablet, TAKE 1 TABLET (25 MG TOTAL) BY MOUTH DAILY., Disp: 30 tablet, Rfl: 2   Medications ordered in this encounter:  Meds ordered this encounter  Medications   amoxicillin (AMOXIL) 500 MG capsule    Sig: Take 1 capsule (500 mg total) by mouth 2 (two) times daily for 10 days.    Dispense:  20 capsule    Refill:  0    Order Specific Question:   Supervising Provider    Answer:   MILLER, BRIAN [[id number]]   benzonatate (TESSALON) 100 MG capsule    Sig: Take 1 capsule (100 mg total) by mouth 3 (three) times daily as needed.     Dispense:  30 capsule    Refill:  0    Order Specific Question:   Supervising Provider    Answer:   Noemi Chapel [[id number]]   promethazine-dextromethorphan (PROMETHAZINE-DM) 6.25-15 MG/5ML syrup    Sig: Take 5 mLs by mouth at bedtime as needed for cough.    Dispense:  118 mL    Refill:  0    Order Specific Question:   Supervising Provider    Answer:   MILLER, BRIAN [[id number]]   lidocaine (XYLOCAINE) 2 % solution    Sig: Use as directed 15 mLs in the mouth or throat every 4 (four) hours as needed for mouth pain.    Dispense:  200 mL    Refill:  0    Order Specific Question:   Supervising Provider    Answer:   Sabra Heck, BRIAN [[id number]]     *If you need refills on other medications prior to your next appointment, please contact your pharmacy*  Follow-Up: Call back or seek an in-person evaluation if the symptoms worsen or if the condition fails to improve as anticipated.  Other Instructions - Suspect secondary bacterial pharyngitis following Covid 19 infection - Amoxicillin prescribed as above - Tessalon perles for daytime cough - Promethazine DM for nighttime cough - Salt water gargles as needed - Push fluids - Rest as needed - Seek in person evaluation if not improving or worsening   If you have been instructed to have an in-person evaluation today at a local Urgent Care facility, please use the link below. It will take you to a list of all of our available [facility], including address, phone number and hours of operation. Please do not delay care.  [facility]  If you or a family member do not have a primary care provider, use the link below to schedule a visit and establish care. When you choose a [facility] primary care physician or advanced practice provider, you gain a long-term partner in health. Find a Primary Care Provider  Learn more about 's in-office and virtual care options: [facility]

## 2021-05-03 ENCOUNTER — Telehealth: Payer: No Typology Code available for payment source | Admitting: Physician Assistant

## 2021-05-03 ENCOUNTER — Encounter: Payer: Self-pay | Admitting: Physician Assistant

## 2021-05-03 ENCOUNTER — Other Ambulatory Visit (HOSPITAL_BASED_OUTPATIENT_CLINIC_OR_DEPARTMENT_OTHER): Payer: Self-pay

## 2021-05-03 DIAGNOSIS — U071 COVID-19: Secondary | ICD-10-CM

## 2021-05-03 MED ORDER — GUAIFENESIN ER 600 MG PO TB12
600.0000 mg | ORAL_TABLET | Freq: Two times a day (BID) | ORAL | 0 refills | Status: DC
  Filled 2021-05-03: qty 40, 20d supply, fill #0

## 2021-05-03 MED ORDER — NYSTATIN 100000 UNIT/ML MT SUSP
5.0000 mL | Freq: Four times a day (QID) | OROMUCOSAL | 0 refills | Status: DC
  Filled 2021-05-03: qty 60, 3d supply, fill #0

## 2021-05-03 NOTE — Progress Notes (Signed)
[name], Catherine Espinoza are scheduled for a virtual visit with your provider today.    Just as we do with appointments in the office, we must obtain your consent to participate.  Your consent will be active for this visit and any virtual visit you may have with one of our providers in the next 365 days.    If you have a MyChart account, I can also send a copy of this consent to you electronically.  All virtual visits are billed to your insurance company just like a traditional visit in the office.  As this is a virtual visit, video technology does not allow for your provider to perform a traditional examination.  This may limit your provider's ability to fully assess your condition.  If your provider identifies any concerns that need to be evaluated in person or the need to arrange testing such as labs, EKG, etc, we will make arrangements to do so.    Although advances in technology are sophisticated, we cannot ensure that it will always work on either your end or our end.  If the connection with a video visit is poor, we may have to switch to a telephone visit.  With either a video or telephone visit, we are not always able to ensure that we have a secure connection.   I need to obtain your verbal consent now.   Are you willing to proceed with your visit today?   Catherine Espinoza has provided verbal consent on 05/03/2021 for a virtual visit (video or telephone).   Catherine Booze, PA-C 05/03/2021  1:38 PM  [name], Catherine Espinoza are scheduled for a virtual visit with your provider today.    Just as we do with appointments in the office, we must obtain your consent to participate.  Your consent will be active for this visit and any virtual visit you may have with one of our providers in the next 365 days.    If you have a MyChart account, I can also send a copy of this consent to you electronically.  All virtual visits are billed to your insurance company just like a traditional visit in the office.  As this is a  virtual visit, video technology does not allow for your provider to perform a traditional examination.  This may limit your provider's ability to fully assess your condition.  If your provider identifies any concerns that need to be evaluated in person or the need to arrange testing such as labs, EKG, etc, we will make arrangements to do so.    Although advances in technology are sophisticated, we cannot ensure that it will always work on either your end or our end.  If the connection with a video visit is poor, we may have to switch to a telephone visit.  With either a video or telephone visit, we are not always able to ensure that we have a secure connection.   I need to obtain your verbal consent now.   Are you willing to proceed with your visit today?   Catherine Espinoza has provided verbal consent on 05/03/2021 for a virtual visit (video or telephone).   Catherine Booze, PA-C 05/03/2021  1:38 PM   Date:  05/03/2021   ID:  Catherine Espinoza, DOB [birth year], MRN [id number]  Patient Location: Home Provider Location: Home Office   Participants: Patient and Provider for Visit and Wrap up  Method of visit: Video  Location of Patient: Home Location of Provider: Home Office Consent was obtain for visit  over the video. Services rendered by provider: Visit was performed via video  A video enabled telemedicine application was used and I verified that I am speaking with the correct person using two identifiers.  PCP:  [name], NP   Chief Complaint:  covid, sore throat, rhinorrhea, cough  History of Present Illness:    Catherine Espinoza is a 45 y.o. female with history as stated below. Presents video telehealth for an acute care visit  Pt dx with covid 2 weeks ago. Had a visit last week for continued sore throat and was given amox. States she is still having rhinorrhea, cough and sore throat. Sore throat worse with swallowing and worse at night. Has tried cough drops and cough meds without  relief.  Denies fevers, chest pain, sob  Past Medical, Surgical, Social History, Allergies, and Medications have been Reviewed.  Past Medical History:  Diagnosis Date   Anemia, unspecified    CAD (coronary artery disease)    Chronic left-sided thoracic back pain    Excessive or frequent menstruation    Morbid obesity ([place])    Obstructive sleep apnea 02/11/11   Sleep study: severe OSA- rec CPAP 20cm small  full face mask   Proteinuria    Thoracic radiculopathy    Type II or unspecified type diabetes mellitus without mention of complication, not stated as uncontrolled    Unspecified essential hypertension     No outpatient medications have been marked as taking for the 05/03/21 encounter (Appointment) with Tivis Ringer, Fajr Fife S, PA-C.     Allergies:   Ibuprofen, Labetalol, and Aspirin   ROS See HPI for history of present illness.  Physical Exam Vitals and nursing note reviewed.  Constitutional:      General: She is not in acute distress.    Appearance: She is well-developed.  HENT:     Head: Normocephalic.     Nose:     Comments: rhinorrhea Eyes:     Conjunctiva/sclera: Conjunctivae normal.  Pulmonary:     Effort: Pulmonary effort is normal.  Skin:    General: Skin is warm.  Neurological:     Mental Status: She is alert.              MDM: pt with recent covid dx. C/o sore throat, rhinorrhea and persistent cough. Recently tx with amox, lower suspicion for bacterial pharyngitis. Suspect sore throat is likely from persistent post nasal drip. Will give rx for mucinex. Pt also concerned for thrush given recent abx use. Unable to fully evaluate this on video visit and pt understands this. We will trial nystatin mouth wash. Advised to f/u with pcp in the next 3-5 days for in person visit.  There are no diagnoses linked to this encounter.   Time:   Today, I have spent 16 minutes with the patient with telehealth technology discussing the above problems, reviewing the chart,  previous notes, medications and orders.    Tests Ordered: No orders of the defined types were placed in this encounter.   Medication Changes: No orders of the defined types were placed in this encounter.    Disposition:  Follow up  Signed, Catherine Booze, PA-C  05/03/2021 1:38 PM

## 2021-05-03 NOTE — Patient Instructions (Signed)
Catherine Espinoza, thank you for joining Rodney Booze, PA-C for today's virtual visit.  While this provider is not your primary care provider (PCP), if your PCP is located in our provider database this encounter information will be shared with them immediately following your visit.  Consent: (Patient) Catherine Espinoza provided verbal consent for this virtual visit at the beginning of the encounter.  Current Medications:  Current Outpatient Medications:    guaiFENesin (MUCINEX) 600 MG 12 hr tablet, Take 1 tablet (600 mg total) by mouth 2 (two) times daily., Disp: 6 tablet, Rfl: 0   nystatin (MYCOSTATIN) 100000 UNIT/ML suspension, Take 5 mLs (500,000 Units total) by mouth 4 (four) times daily., Disp: 60 mL, Rfl: 0   amoxicillin (AMOXIL) 500 MG capsule, Take 1 capsule (500 mg total) by mouth 2 (two) times daily for 10 days., Disp: 20 capsule, Rfl: 0   aspirin 81 MG chewable tablet, CHEW 1 TABLET (81 MG TOTAL) BY MOUTH DAILY., Disp: 30 tablet, Rfl: 2   BAYER MICROLET LANCETS lancets, Use as instructed to check blood sugar three times daily.  DX  E11.65 (Patient taking differently: Use as instructed to check blood sugar three times daily.  DX  E11.65), Disp: 100 each, Rfl: 5   benzonatate (TESSALON) 100 MG capsule, Take 1 capsule (100 mg total) by mouth 3 (three) times daily as needed., Disp: 30 capsule, Rfl: 0   blood glucose meter kit and supplies KIT, Dispense based on patient and insurance preference. Use up to four times daily as directed. (FOR ICD-9 250.00, 250.01)., Disp: 1 each, Rfl: 0   Blood Glucose Monitoring Suppl (FREESTYLE LITE) w/Device KIT, USE UP TO 4 TIMES DAILY, Disp: 1 kit, Rfl: 0   carvedilol (COREG) 12.5 MG tablet, Take 1 & 1/2 tablets (18.75 mg total) by mouth 2 (two) times daily with a meal., Disp: 60 tablet, Rfl: 11   clopidogrel (PLAVIX) 75 MG tablet, TAKE 1 TABLET (75 MG TOTAL) BY MOUTH DAILY., Disp: 30 tablet, Rfl: 2   Continuous Blood Gluc Receiver (FREESTYLE LIBRE 2  READER) DEVI, USE AS DIRECTED, Disp: 1 each, Rfl: 0   Continuous Blood Gluc Sensor (FREESTYLE LIBRE SENSOR SYSTEM) MISC, APPLY SENSOR EVERY 14 DAYS, Disp: 2 each, Rfl: 0   dapagliflozin propanediol (FARXIGA) 10 MG TABS tablet, TAKE 1 TABLET (10 MG TOTAL) BY MOUTH DAILY., Disp: 30 tablet, Rfl: 2   furosemide (LASIX) 40 MG tablet, TAKE 1 TABLET (40 MG TOTAL) BY MOUTH DAILY. FOR EVERY 3 LB WEIGHT GAIN, Disp: 30 tablet, Rfl: 6   glucose blood test strip, USE UP TO 4 TIMES DAILY, Disp: 100 strip, Rfl: 0   insulin degludec (TRESIBA) 100 UNIT/ML FlexTouch Pen, INJECT UP TO 50 UNITS UNDER THE SKIN DAILY, Disp: 15 mL, Rfl: 3   Insulin Pen Needle 32G X 4 MM MISC, USE AS DIRECTED WITH INSULIN, Disp: 100 each, Rfl: 0   Insulin Pen Needle 32G X 4 MM MISC, USE AS DIRECTED ONCE DAILY, Disp: 100 each, Rfl: 0   Insulin Syringe 27G X 1/2" 0.5 ML MISC, Use as directed, Disp: 100 each, Rfl: 3   Lancets (FREESTYLE) lancets, USE UP TO FOUR TIMES DAILY AS DIRECTED, Disp: 100 each, Rfl: 0   lidocaine (XYLOCAINE) 2 % solution, Use as directed 15 mLs in the mouth or throat every 4 (four) hours as needed for mouth pain., Disp: 200 mL, Rfl: 0   potassium chloride SA (KLOR-CON) 20 MEQ tablet, TAKE 1 TABLET (20 MEQ TOTAL) BY MOUTH DAILY., Disp: 30  tablet, Rfl: 6   promethazine-dextromethorphan (PROMETHAZINE-DM) 6.25-15 MG/5ML syrup, Take 5 mLs by mouth at bedtime as needed for cough., Disp: 118 mL, Rfl: 0   rosuvastatin (CRESTOR) 40 MG tablet, TAKE 1 TABLET (40 MG TOTAL) BY MOUTH DAILY., Disp: 30 tablet, Rfl: 2   sacubitril-valsartan (ENTRESTO) 97-103 MG, Take 1 tablet by mouth 2 (two) times daily., Disp: 90 tablet, Rfl: 2   spironolactone (ALDACTONE) 25 MG tablet, TAKE 1 TABLET (25 MG TOTAL) BY MOUTH DAILY., Disp: 30 tablet, Rfl: 2   Medications ordered in this encounter:  Meds ordered this encounter  Medications   nystatin (MYCOSTATIN) 100000 UNIT/ML suspension    Sig: Take 5 mLs (500,000 Units total) by mouth 4 (four)  times daily.    Dispense:  60 mL    Refill:  0    Order Specific Question:   Supervising Provider    Answer:   MILLER, BRIAN [[id number]]   guaiFENesin (MUCINEX) 600 MG 12 hr tablet    Sig: Take 1 tablet (600 mg total) by mouth 2 (two) times daily.    Dispense:  6 tablet    Refill:  0    Order Specific Question:   Supervising Provider    Answer:   Sabra Heck, BRIAN [[id number]]     *If you need refills on other medications prior to your next appointment, please contact your pharmacy*  Follow-Up: Call back or seek an in-person evaluation if the symptoms worsen or if the condition fails to improve as anticipated.  Other Instructions Take mucinex as directed and use nystatin as directed. You can also gargle with warm salt water and drink hot tea with honey to help with the sore throat and cough.  Please follow up with your regular doctor within the next 3-5 days for new or worsening symptoms.   If you have been instructed to have an in-person evaluation today at a local Urgent Care facility, please use the link below. It will take you to a list of all of our available [facility], including address, phone number and hours of operation. Please do not delay care.  [facility]  If you or a family member do not have a primary care provider, use the link below to schedule a visit and establish care. When you choose a [facility] primary care physician or advanced practice provider, you gain a long-term partner in health. Find a Primary Care Provider  Learn more about 's in-office and virtual care options: [facility]

## 2021-05-06 ENCOUNTER — Emergency Department (HOSPITAL_BASED_OUTPATIENT_CLINIC_OR_DEPARTMENT_OTHER)
Admission: EM | Admit: 2021-05-06 | Discharge: 2021-05-06 | Disposition: A | Discharge status: Home / Self Care | Payer: No Typology Code available for payment source | Attending: Emergency Medicine | Admitting: Emergency Medicine

## 2021-05-06 ENCOUNTER — Emergency Department (HOSPITAL_BASED_OUTPATIENT_CLINIC_OR_DEPARTMENT_OTHER): Payer: No Typology Code available for payment source

## 2021-05-06 ENCOUNTER — Other Ambulatory Visit: Payer: Self-pay

## 2021-05-06 DIAGNOSIS — Z7902 Long term (current) use of antithrombotics/antiplatelets: Secondary | ICD-10-CM | POA: Insufficient documentation

## 2021-05-06 DIAGNOSIS — Z7982 Long term (current) use of aspirin: Secondary | ICD-10-CM | POA: Diagnosis not present

## 2021-05-06 DIAGNOSIS — Z7984 Long term (current) use of oral hypoglycemic drugs: Secondary | ICD-10-CM | POA: Insufficient documentation

## 2021-05-06 DIAGNOSIS — F17210 Nicotine dependence, cigarettes, uncomplicated: Secondary | ICD-10-CM | POA: Diagnosis not present

## 2021-05-06 DIAGNOSIS — I2510 Atherosclerotic heart disease of native coronary artery without angina pectoris: Secondary | ICD-10-CM | POA: Diagnosis not present

## 2021-05-06 DIAGNOSIS — E119 Type 2 diabetes mellitus without complications: Secondary | ICD-10-CM | POA: Diagnosis not present

## 2021-05-06 DIAGNOSIS — Z794 Long term (current) use of insulin: Secondary | ICD-10-CM | POA: Insufficient documentation

## 2021-05-06 DIAGNOSIS — Z79899 Other long term (current) drug therapy: Secondary | ICD-10-CM | POA: Insufficient documentation

## 2021-05-06 DIAGNOSIS — I509 Heart failure, unspecified: Secondary | ICD-10-CM | POA: Insufficient documentation

## 2021-05-06 DIAGNOSIS — I110 Hypertensive heart disease with heart failure: Secondary | ICD-10-CM | POA: Diagnosis not present

## 2021-05-06 DIAGNOSIS — R059 Cough, unspecified: Secondary | ICD-10-CM

## 2021-05-06 DIAGNOSIS — J029 Acute pharyngitis, unspecified: Secondary | ICD-10-CM | POA: Diagnosis present

## 2021-05-06 DIAGNOSIS — U071 COVID-19: Secondary | ICD-10-CM | POA: Diagnosis not present

## 2021-05-06 DIAGNOSIS — I10 Essential (primary) hypertension: Secondary | ICD-10-CM

## 2021-05-06 MED ORDER — PREDNISONE 50 MG PO TABS: 50.0000 mg | ORAL_TABLET | Freq: Every day | ORAL | 0 refills | Status: DC

## 2021-05-06 MED ORDER — ALBUTEROL SULFATE HFA 108 (90 BASE) MCG/ACT IN AERS
2.0000 | INHALATION_SPRAY | Freq: Once | RESPIRATORY_TRACT | Status: AC
  Administered 2021-05-06: 2 via RESPIRATORY_TRACT

## 2021-05-06 MED ORDER — PREDNISONE 50 MG PO TABS
60.0000 mg | ORAL_TABLET | Freq: Once | ORAL | Status: AC
  Administered 2021-05-06: 60 mg via ORAL
  Filled 2021-05-06: qty 1

## 2021-05-06 NOTE — Discharge Instructions (Addendum)
Use the inhaler - two puffs every 4-6 hours -as needed to help control your cough.  Take ibuprofen and/or acetaminophen as needed for pain.  Please note that combining ibuprofen and acetaminophen will give you better pain relief than either medication by itself.  Your blood pressure was running a little high today.  Please have it rechecked over the next few days.  If it continues to run high, your primary care provider may need to adjust your blood pressure medication.

## 2021-05-06 NOTE — ED Triage Notes (Signed)
Reports she was covid positive about two weeks ago.  Had cough and sore throat since.  Been taking amoxicillin as well as prescription cough meds with no relief.

## 2021-05-06 NOTE — ED Provider Notes (Signed)
[facility] EMERGENCY DEPARTMENT Provider Note   CSN: [id number] Arrival date & time: 05/06/21  0219     History Chief Complaint  Patient presents with   Sore Throat   Cough    Catherine Espinoza is a 45 y.o. female.  The history is provided by the patient.  Sore Throat  Cough She has history of hypertension, diabetes, coronary artery disease, heart failure and comes in with ongoing cough and sore throat from COVID-19.  She was diagnosed with COVID-19 about 2 weeks ago.  Since then, she has had persistent cough which is nonproductive, and has had a severe sore throat.  Throat pain radiates to the right ear.  She has been treated with a course of amoxicillin  She is not running fevers.  She had lost her sense of smell and taste and this has persisted.  She denies vomiting or diarrhea.  She has been able to drink fluids.   Past Medical History:  Diagnosis Date   Anemia, unspecified    CAD (coronary artery disease)    Chronic left-sided thoracic back pain    Excessive or frequent menstruation    Morbid obesity ([place])    Obstructive sleep apnea 02/11/11   Sleep study: severe OSA- rec CPAP 20cm small  full face mask   Proteinuria    Thoracic radiculopathy    Type II or unspecified type diabetes mellitus without mention of complication, not stated as uncontrolled    Unspecified essential hypertension     Patient Active Problem List   Diagnosis Date Noted   CAD (coronary artery disease)    Coronary artery disease involving native coronary artery of native heart with unstable angina pectoris ([place])    Acute CHF (congestive heart failure) ([place]) 10/12/2020   CHF, acute ([place]) 10/11/2020   Preventative health care 06/21/2016   GERD (gastroesophageal reflux disease) 12/25/2014   Onychomycosis 08/11/2014   HTN (hypertension) 10/11/2013   Hidradenitis suppurativa 08/02/2011   Fatigue 08/02/2011   Obstructive sleep apnea 02/15/2011   Hypertension 02/15/2011   Hyperlipidemia  01/16/2011   PROTEINURIA 11/16/2009   Diabetes type 2, uncontrolled ([place]) 11/03/2009   ANEMIA 11/03/2009   MORBID OBESITY 10/31/2009   TOBACCO ABUSE 10/31/2009    Past Surgical History:  Procedure Laterality Date   ABDOMINAL HYSTERECTOMY     ABLATION COLPOCLESIS     CESAREAN SECTION     CLEFT PALATE REPAIR     CORONARY STENT INTERVENTION N/A 10/13/2020   Procedure: CORONARY STENT INTERVENTION;  Surgeon: Burnell Blanks, MD;  Location: [facility];  Service: Cardiovascular;  Laterality: N/A;   cyst removal     ECTOPIC PREGNANCY SURGERY     x 2   OOPHORECTOMY Right 2002   RIGHT/LEFT HEART CATH AND CORONARY ANGIOGRAPHY N/A 10/13/2020   Procedure: RIGHT/LEFT HEART CATH AND CORONARY ANGIOGRAPHY;  Surgeon: Jolaine Artist, MD;  Location: [facility];  Service: Cardiovascular;  Laterality: N/A;     OB History   No obstetric history on file.     Family History  Problem Relation Age of Onset   Heart attack Maternal Aunt    Diabetes Mother    Cancer Father        oral cancer    Social History   Tobacco Use   Smoking status: Every Day    Packs/day: 0.50    Years: 10.00    Pack years: 5.00    Types: Cigarettes   Smokeless tobacco: Never  Vaping Use  Vaping Use: Never used  Substance Use Topics   Alcohol use: Yes    Alcohol/week: 0.0 standard drinks    Comment: occasional use   Drug use: No    Home Medications Prior to Admission medications   Medication Sig Start Date End Date Taking? Authorizing Provider  aspirin 81 MG chewable tablet CHEW 1 TABLET (81 MG TOTAL) BY MOUTH DAILY. 10/16/20 10/16/21  Rai, Vernelle Emerald, MD  BAYER MICROLET LANCETS lancets Use as instructed to check blood sugar three times daily.  DX  E11.65 Patient taking differently: Use as instructed to check blood sugar three times daily.  DX  E11.65 10/24/17   [name], NP  benzonatate (TESSALON) 100 MG capsule Take 1 capsule (100 mg total) by mouth 3 (three) times daily  as needed. 04/23/21   Mar Daring, PA-C  blood glucose meter kit and supplies KIT Dispense based on patient and insurance preference. Use up to four times daily as directed. (FOR ICD-9 250.00, 250.01). 10/03/20   [name], NP  Blood Glucose Monitoring Suppl (FREESTYLE LITE) w/Device KIT USE UP TO 4 TIMES DAILY 10/03/20 10/03/21  [name], NP  carvedilol (COREG) 12.5 MG tablet Take 1 & 1/2 tablets (18.75 mg total) by mouth 2 (two) times daily with a meal. 03/12/21 03/12/22  Bensimhon, Shaune Pascal, MD  clopidogrel (PLAVIX) 75 MG tablet TAKE 1 TABLET (75 MG TOTAL) BY MOUTH DAILY. 03/06/21 03/06/22  Rafael Bihari, FNP  Continuous Blood Gluc Receiver (FREESTYLE LIBRE 2 READER) DEVI USE AS DIRECTED 10/10/20 10/10/21  [name], NP  Continuous Blood Gluc Sensor (FREESTYLE LIBRE SENSOR SYSTEM) MISC APPLY SENSOR EVERY 14 DAYS 10/03/20 10/03/21  [name], NP  dapagliflozin propanediol (FARXIGA) 10 MG TABS tablet TAKE 1 TABLET (10 MG TOTAL) BY MOUTH DAILY. 03/06/21 03/06/22  Rafael Bihari, FNP  furosemide (LASIX) 40 MG tablet TAKE 1 TABLET (40 MG TOTAL) BY MOUTH DAILY. FOR EVERY 3 LB WEIGHT GAIN 12/07/20 12/07/21  Rafael Bihari, FNP  glucose blood test strip USE UP TO 4 TIMES DAILY 10/03/20 10/03/21  [name], NP  guaiFENesin (MUCINEX) 600 MG 12 hr tablet Take 1 tablet (600 mg total) by mouth 2 (two) times daily. 05/03/21   Couture, Cortni S, PA-C  insulin degludec (TRESIBA) 100 UNIT/ML FlexTouch Pen INJECT UP TO 50 UNITS UNDER THE SKIN DAILY 12/19/20 12/19/21  [name], NP  Insulin Pen Needle 32G X 4 MM MISC USE AS DIRECTED WITH INSULIN 10/16/20 10/16/21  Rai, Vernelle Emerald, MD  Insulin Pen Needle 32G X 4 MM MISC USE AS DIRECTED ONCE DAILY 10/03/20 10/03/21  [name], NP  Insulin Syringe 27G X 1/2" 0.5 ML MISC Use as directed 03/16/20   [name], NP  Lancets (FREESTYLE) lancets USE UP TO FOUR TIMES DAILY AS DIRECTED 10/03/20 10/03/21  [name], NP  lidocaine (XYLOCAINE) 2 % solution Use as directed 15 mLs in the mouth or throat every 4 (four) hours as needed for mouth pain. 04/23/21   Mar Daring, PA-C  nystatin (MYCOSTATIN) 100000 UNIT/ML suspension Take 5 mLs (500,000 Units total) by mouth 4 (four) times daily. 05/03/21   Couture, Cortni S, PA-C  potassium chloride SA (KLOR-CON) 20 MEQ tablet TAKE 1 TABLET (20 MEQ TOTAL) BY MOUTH DAILY. 12/07/20 12/07/21  Rafael Bihari, FNP  promethazine-dextromethorphan (PROMETHAZINE-DM) 6.25-15 MG/5ML syrup Take 5 mLs by mouth at bedtime as needed for cough. 04/23/21   Mar Daring, PA-C  rosuvastatin (CRESTOR) 40 MG tablet TAKE 1 TABLET (  40 MG TOTAL) BY MOUTH DAILY. 03/06/21 03/06/22  Rafael Bihari, FNP  sacubitril-valsartan (ENTRESTO) 97-103 MG Take 1 tablet by mouth 2 (two) times daily. 03/30/21 03/30/22  Rafael Bihari, FNP  spironolactone (ALDACTONE) 25 MG tablet TAKE 1 TABLET (25 MG TOTAL) BY MOUTH DAILY. 03/06/21 03/06/22  Rafael Bihari, FNP  atorvastatin (LIPITOR) 40 MG tablet Take 1 tablet (40 mg total) by mouth daily. 10/03/20 10/16/20  [name], NP  Insulin Glargine (BASAGLAR KWIKPEN) 100 UNIT/ML Inject 65 Units into the skin daily. Patient not taking: Reported on 10/12/2020 10/04/20 10/16/20  [name], NP  lisinopril (ZESTRIL) 20 MG tablet Take 1 tablet (20 mg total) by mouth daily. 10/03/20 10/16/20  [name], NP    Allergies    Ibuprofen, Labetalol, and Aspirin  Review of Systems   Review of Systems  Respiratory:  Positive for cough.   All other systems reviewed and are negative.  Physical Exam Updated Vital Signs BP (!) 194/122 (BP Location: Right Arm)   Pulse (!) 113   Temp 98.5 F (36.9 C) (Oral)   Resp 20   Ht _0  (1.651 m)   Wt 113.4 kg   LMP 09/13/2011   SpO2 97%   BMI 41.60 kg/m   Physical Exam Vitals and nursing note reviewed.  45 year old female, resting comfortably and in no acute distress. Vital signs are  significant for elevated blood pressure and heart rate. Oxygen saturation is 97%, which is normal. Head is normocephalic and atraumatic. PERRLA, EOMI. Oropharynx has mild erythema of the without any swelling or exudate.  Phonation is normal and there is no pooling of secretions.  Tympanic membranes are clear. Neck is nontender and supple without adenopathy or JVD. Back is nontender and there is no CVA tenderness. Lungs are clear without rales, wheezes, or rhonchi. Chest is nontender. Heart has regular rate and rhythm without murmur. Abdomen is soft, flat, nontender without masses or hepatosplenomegaly and peristalsis is normoactive. Extremities have no cyanosis or edema, full range of motion is present. Skin is warm and dry without rash. Neurologic: Mental status is normal, cranial nerves are intact, there are no motor or sensory deficits.  ED Results / Procedures / Treatments    Radiology DG Chest Port 1 View  Result Date: 05/06/2021 CLINICAL DATA:  Recent COVID-19 infection with persistent cough and sore throat, initial encounter EXAM: PORTABLE CHEST 1 VIEW COMPARISON:  10/11/2020 FINDINGS: The heart size and mediastinal contours are within normal limits. Both lungs are clear. The visualized skeletal structures are unremarkable. IMPRESSION: No active disease. Electronically Signed   By: Inez Catalina M.D.   On: 05/06/2021 03:35    Procedures Procedures   Medications Ordered in ED Medications  predniSONE (DELTASONE) tablet 60 mg (has no administration in time range)  albuterol (VENTOLIN HFA) 108 (90 Base) MCG/ACT inhaler 2 puff (2 puffs Inhalation Given 05/06/21 0335)    ED Course  I have reviewed the triage vital signs and the nursing notes.  Pertinent imaging results that were available during my care of the patient were reviewed by me and considered in my medical decision making (see chart for details).   MDM Rules/Calculators/A&P                         COVID-19 infection with  persistent sore throat and cough.  Will check chest x-ray to rule out superimposed pneumonia.  No need for strep PCR as she did not improve with a  course of amoxicillin.  She will be given a therapeutic trial of albuterol and will also start on a short course of prednisone.  Old records reviewed confirming recent diagnosis of COVID-19 with positive COVID PCR on 04/17/2021.  Chest x-ray shows no evidence of pneumonia.  She had modest improvement in her cough with albuterol inhaler.  She is advised to continue using albuterol inhaler every 4-6 hours as needed to help suppress the cough, is discharged with a short course of prednisone.  Also advised to monitor her blood pressure as an outpatient.  Follow-up with PCP.  Final Clinical Impression(s) / ED Diagnoses Final diagnoses:  COVID-19 virus infection  Cough  Sore throat  Elevated blood pressure reading with diagnosis of hypertension    Rx / DC Orders ED Discharge Orders          Ordered    predniSONE (DELTASONE) 50 MG tablet  Daily        05/06/21 6720             Delora Fuel, MD 94/70/96 [phone]

## 2021-05-10 ENCOUNTER — Other Ambulatory Visit (HOSPITAL_BASED_OUTPATIENT_CLINIC_OR_DEPARTMENT_OTHER): Payer: Self-pay

## 2021-05-15 ENCOUNTER — Other Ambulatory Visit (HOSPITAL_BASED_OUTPATIENT_CLINIC_OR_DEPARTMENT_OTHER): Payer: Self-pay

## 2021-05-15 MED FILL — Furosemide Tab 40 MG: ORAL | 30 days supply | Qty: 30 | Fill #2 | Status: AC

## 2021-06-13 ENCOUNTER — Other Ambulatory Visit (HOSPITAL_BASED_OUTPATIENT_CLINIC_OR_DEPARTMENT_OTHER): Payer: Self-pay

## 2021-06-14 ENCOUNTER — Ambulatory Visit (HOSPITAL_BASED_OUTPATIENT_CLINIC_OR_DEPARTMENT_OTHER): Payer: No Typology Code available for payment source | Admitting: Cardiovascular Disease

## 2021-06-28 ENCOUNTER — Encounter: Payer: Self-pay | Admitting: Cardiovascular Disease

## 2021-07-01 ENCOUNTER — Encounter (HOSPITAL_BASED_OUTPATIENT_CLINIC_OR_DEPARTMENT_OTHER): Payer: Self-pay | Admitting: Emergency Medicine

## 2021-07-01 ENCOUNTER — Emergency Department (HOSPITAL_BASED_OUTPATIENT_CLINIC_OR_DEPARTMENT_OTHER)
Admission: EM | Admit: 2021-07-01 | Discharge: 2021-07-01 | Disposition: A | Discharge status: Home / Self Care | Payer: No Typology Code available for payment source | Attending: Emergency Medicine | Admitting: Emergency Medicine

## 2021-07-01 ENCOUNTER — Other Ambulatory Visit: Payer: Self-pay

## 2021-07-01 DIAGNOSIS — R2231 Localized swelling, mass and lump, right upper limb: Secondary | ICD-10-CM | POA: Diagnosis not present

## 2021-07-01 DIAGNOSIS — Z5321 Procedure and treatment not carried out due to patient leaving prior to being seen by health care provider: Secondary | ICD-10-CM | POA: Diagnosis not present

## 2021-07-01 NOTE — ED Triage Notes (Signed)
Pt arrives pov with c/o right hand swelling x 3 days. Denies otc meds pta

## 2021-07-02 ENCOUNTER — Emergency Department (HOSPITAL_BASED_OUTPATIENT_CLINIC_OR_DEPARTMENT_OTHER)
Admission: EM | Admit: 2021-07-02 | Discharge: 2021-07-02 | Disposition: A | Discharge status: Home / Self Care | Payer: No Typology Code available for payment source | Attending: Emergency Medicine | Admitting: Emergency Medicine

## 2021-07-02 ENCOUNTER — Encounter: Payer: Self-pay | Admitting: Family

## 2021-07-02 ENCOUNTER — Other Ambulatory Visit: Payer: Self-pay

## 2021-07-02 ENCOUNTER — Emergency Department (HOSPITAL_BASED_OUTPATIENT_CLINIC_OR_DEPARTMENT_OTHER): Payer: No Typology Code available for payment source

## 2021-07-02 ENCOUNTER — Other Ambulatory Visit (HOSPITAL_BASED_OUTPATIENT_CLINIC_OR_DEPARTMENT_OTHER): Payer: Self-pay

## 2021-07-02 DIAGNOSIS — E119 Type 2 diabetes mellitus without complications: Secondary | ICD-10-CM | POA: Diagnosis not present

## 2021-07-02 DIAGNOSIS — I110 Hypertensive heart disease with heart failure: Secondary | ICD-10-CM | POA: Diagnosis not present

## 2021-07-02 DIAGNOSIS — I25110 Atherosclerotic heart disease of native coronary artery with unstable angina pectoris: Secondary | ICD-10-CM | POA: Insufficient documentation

## 2021-07-02 DIAGNOSIS — Z79899 Other long term (current) drug therapy: Secondary | ICD-10-CM | POA: Diagnosis not present

## 2021-07-02 DIAGNOSIS — M79641 Pain in right hand: Secondary | ICD-10-CM | POA: Insufficient documentation

## 2021-07-02 DIAGNOSIS — Z7984 Long term (current) use of oral hypoglycemic drugs: Secondary | ICD-10-CM | POA: Diagnosis not present

## 2021-07-02 DIAGNOSIS — Z955 Presence of coronary angioplasty implant and graft: Secondary | ICD-10-CM | POA: Insufficient documentation

## 2021-07-02 DIAGNOSIS — Z7982 Long term (current) use of aspirin: Secondary | ICD-10-CM | POA: Diagnosis not present

## 2021-07-02 DIAGNOSIS — F17210 Nicotine dependence, cigarettes, uncomplicated: Secondary | ICD-10-CM | POA: Diagnosis not present

## 2021-07-02 DIAGNOSIS — Z794 Long term (current) use of insulin: Secondary | ICD-10-CM | POA: Diagnosis not present

## 2021-07-02 DIAGNOSIS — I509 Heart failure, unspecified: Secondary | ICD-10-CM | POA: Diagnosis not present

## 2021-07-02 DIAGNOSIS — R030 Elevated blood-pressure reading, without diagnosis of hypertension: Secondary | ICD-10-CM | POA: Diagnosis not present

## 2021-07-02 MED ORDER — ACETAMINOPHEN 325 MG PO TABS
650.0000 mg | ORAL_TABLET | Freq: Once | ORAL | Status: AC
  Administered 2021-07-02: 650 mg via ORAL
  Filled 2021-07-02: qty 2

## 2021-07-02 MED ORDER — DOXYCYCLINE HYCLATE 100 MG PO CAPS
100.0000 mg | ORAL_CAPSULE | Freq: Two times a day (BID) | ORAL | 0 refills | Status: DC
  Filled 2021-07-02: qty 20, 10d supply, fill #0

## 2021-07-02 NOTE — ED Triage Notes (Signed)
C/o right hand swelling x 4 days.

## 2021-07-02 NOTE — Discharge Instructions (Signed)
It was a pleasure taking care of you today.  As discussed, I suspect you have an infection in your hand.  I am sending you home with antibiotics.  Take as prescribed and finish all antibiotics.  Please follow-up with your PCP in the next 2 days for a wound recheck.  Your blood pressure was elevated while you were here in the ER.  Please have your blood pressure rechecked by your PCP.  Return to the ER for new or worsening symptoms.  You may take over-the-counter ibuprofen or Tylenol as needed for pain.

## 2021-07-02 NOTE — ED Notes (Signed)
Presents with rt hand pain and swelling, x 1 week. Denies any trauma or injury

## 2021-07-02 NOTE — ED Provider Notes (Signed)
[facility] Provider Note   CSN: [id number] Arrival date & time: 07/02/21  1120     History Chief Complaint  Patient presents with   Hand Pain    Catherine Espinoza is a 45 y.o. female with a past medical history significant for CAD, type 2 diabetes, hypertension, and hidradenitis suppurativa who presents to the ED due to right hand pain and edema x4 days.  Patient states she has a history of frequent abscesses and feels she has an abscess on her right hand.  Patient states swelling has progressively gotten worse.  No fever or chills.  Pain is worse on the palmar aspect between fourth and fifth right fingers.  Pain worse with palpation and movement.  Denies associated numbness/tingling.  She has been taking Aleve with no relief.  No recent injury.  History obtained from patient and past medical records. No interpreter used during encounter.       Past Medical History:  Diagnosis Date   Anemia, unspecified    CAD (coronary artery disease)    Chronic left-sided thoracic back pain    Excessive or frequent menstruation    Morbid obesity ([place])    Obstructive sleep apnea 02/11/11   Sleep study: severe OSA- rec CPAP 20cm small  full face mask   Proteinuria    Thoracic radiculopathy    Type II or unspecified type diabetes mellitus without mention of complication, not stated as uncontrolled    Unspecified essential hypertension     Patient Active Problem List   Diagnosis Date Noted   CAD (coronary artery disease)    Coronary artery disease involving native coronary artery of native heart with unstable angina pectoris ([place])    Acute CHF (congestive heart failure) ([place]) 10/12/2020   CHF, acute ([place]) 10/11/2020   Preventative health care 06/21/2016   GERD (gastroesophageal reflux disease) 12/25/2014   Onychomycosis 08/11/2014   HTN (hypertension) 10/11/2013   Hidradenitis suppurativa 08/02/2011   Fatigue 08/02/2011   Obstructive sleep apnea 02/15/2011    Hypertension 02/15/2011   Hyperlipidemia 01/16/2011   PROTEINURIA 11/16/2009   Diabetes type 2, uncontrolled 11/03/2009   ANEMIA 11/03/2009   MORBID OBESITY 10/31/2009   TOBACCO ABUSE 10/31/2009    Past Surgical History:  Procedure Laterality Date   ABDOMINAL HYSTERECTOMY     ABLATION COLPOCLESIS     CESAREAN SECTION     CLEFT PALATE REPAIR     CORONARY STENT INTERVENTION N/A 10/13/2020   Procedure: CORONARY STENT INTERVENTION;  Surgeon: Burnell Blanks, MD;  Location: [facility];  Service: Cardiovascular;  Laterality: N/A;   cyst removal     ECTOPIC PREGNANCY SURGERY     x 2   OOPHORECTOMY Right 2002   RIGHT/LEFT HEART CATH AND CORONARY ANGIOGRAPHY N/A 10/13/2020   Procedure: RIGHT/LEFT HEART CATH AND CORONARY ANGIOGRAPHY;  Surgeon: Jolaine Artist, MD;  Location: [facility];  Service: Cardiovascular;  Laterality: N/A;     OB History   No obstetric history on file.     Family History  Problem Relation Age of Onset   Heart attack Maternal Aunt    Diabetes Mother    Cancer Father        oral cancer    Social History   Tobacco Use   Smoking status: Every Day    Packs/day: 0.50    Years: 10.00    Pack years: 5.00    Types: Cigarettes   Smokeless tobacco: Never  Vaping Use   Vaping  Use: Never used  Substance Use Topics   Alcohol use: Yes    Alcohol/week: 0.0 standard drinks    Comment: occasional use   Drug use: No    Home Medications Prior to Admission medications   Medication Sig Start Date End Date Taking? Authorizing Provider  doxycycline (VIBRAMYCIN) 100 MG capsule Take 1 capsule (100 mg total) by mouth 2 (two) times daily. 07/02/21  Yes Refugio Mcconico, Druscilla Brownie, PA-C  aspirin 81 MG chewable tablet CHEW 1 TABLET (81 MG TOTAL) BY MOUTH DAILY. 10/16/20 10/16/21  Rai, Vernelle Emerald, MD  BAYER MICROLET LANCETS lancets Use as instructed to check blood sugar three times daily.  DX  E11.65 Patient taking differently: Use as instructed to check  blood sugar three times daily.  DX  E11.65 10/24/17   [name], NP  benzonatate (TESSALON) 100 MG capsule Take 1 capsule (100 mg total) by mouth 3 (three) times daily as needed. 04/23/21   Mar Daring, PA-C  blood glucose meter kit and supplies KIT Dispense based on patient and insurance preference. Use up to four times daily as directed. (FOR ICD-9 250.00, 250.01). 10/03/20   [name], NP  Blood Glucose Monitoring Suppl (FREESTYLE LITE) w/Device KIT USE UP TO 4 TIMES DAILY 10/03/20 10/03/21  [name], NP  carvedilol (COREG) 12.5 MG tablet Take 1 & 1/2 tablets (18.75 mg total) by mouth 2 (two) times daily with a meal. 03/12/21 03/12/22  Bensimhon, Shaune Pascal, MD  clopidogrel (PLAVIX) 75 MG tablet TAKE 1 TABLET (75 MG TOTAL) BY MOUTH DAILY. 03/06/21 03/06/22  Rafael Bihari, FNP  Continuous Blood Gluc Receiver (FREESTYLE LIBRE 2 READER) DEVI USE AS DIRECTED 10/10/20 10/10/21  [name], NP  Continuous Blood Gluc Sensor (FREESTYLE LIBRE SENSOR SYSTEM) MISC APPLY SENSOR EVERY 14 DAYS 10/03/20 10/03/21  [name], NP  dapagliflozin propanediol (FARXIGA) 10 MG TABS tablet TAKE 1 TABLET (10 MG TOTAL) BY MOUTH DAILY. 03/06/21 03/06/22  Rafael Bihari, FNP  furosemide (LASIX) 40 MG tablet TAKE 1 TABLET (40 MG TOTAL) BY MOUTH DAILY. FOR EVERY 3 LB WEIGHT GAIN 12/07/20 12/07/21  Rafael Bihari, FNP  glucose blood test strip USE UP TO 4 TIMES DAILY 10/03/20 10/03/21  [name], NP  guaiFENesin (MUCINEX) 600 MG 12 hr tablet Take 1 tablet (600 mg total) by mouth 2 (two) times daily. 05/03/21   Couture, Cortni S, PA-C  insulin degludec (TRESIBA) 100 UNIT/ML FlexTouch Pen INJECT UP TO 50 UNITS UNDER THE SKIN DAILY 12/19/20 12/19/21  [name], NP  Insulin Pen Needle 32G X 4 MM MISC USE AS DIRECTED WITH INSULIN 10/16/20 10/16/21  Rai, Vernelle Emerald, MD  Insulin Pen Needle 32G X 4 MM MISC USE AS DIRECTED ONCE DAILY 10/03/20 10/03/21  [name], NP  Insulin  Syringe 27G X 1/2" 0.5 ML MISC Use as directed 03/16/20   [name], NP  Lancets (FREESTYLE) lancets USE UP TO FOUR TIMES DAILY AS DIRECTED 10/03/20 10/03/21  [name], NP  lidocaine (XYLOCAINE) 2 % solution Use as directed 15 mLs in the mouth or throat every 4 (four) hours as needed for mouth pain. 04/23/21   Mar Daring, PA-C  nystatin (MYCOSTATIN) 100000 UNIT/ML suspension Take 5 mLs (500,000 Units total) by mouth 4 (four) times daily. 05/03/21   Couture, Cortni S, PA-C  potassium chloride SA (KLOR-CON) 20 MEQ tablet TAKE 1 TABLET (20 MEQ TOTAL) BY MOUTH DAILY. 12/07/20 12/07/21  Rafael Bihari, FNP  predniSONE (DELTASONE) 50 MG tablet Take 1 tablet (  50 mg total) by mouth daily. 02/02/14   Delora Fuel, MD  promethazine-dextromethorphan (PROMETHAZINE-DM) 6.25-15 MG/5ML syrup Take 5 mLs by mouth at bedtime as needed for cough. 04/23/21   Mar Daring, PA-C  rosuvastatin (CRESTOR) 40 MG tablet TAKE 1 TABLET (40 MG TOTAL) BY MOUTH DAILY. 03/06/21 03/06/22  Rafael Bihari, FNP  sacubitril-valsartan (ENTRESTO) 97-103 MG Take 1 tablet by mouth 2 (two) times daily. 03/30/21 03/30/22  Rafael Bihari, FNP  spironolactone (ALDACTONE) 25 MG tablet TAKE 1 TABLET (25 MG TOTAL) BY MOUTH DAILY. 03/06/21 03/06/22  Rafael Bihari, FNP  atorvastatin (LIPITOR) 40 MG tablet Take 1 tablet (40 mg total) by mouth daily. 10/03/20 10/16/20  [name], NP  Insulin Glargine (BASAGLAR KWIKPEN) 100 UNIT/ML Inject 65 Units into the skin daily. Patient not taking: Reported on 10/12/2020 10/04/20 10/16/20  [name], NP  lisinopril (ZESTRIL) 20 MG tablet Take 1 tablet (20 mg total) by mouth daily. 10/03/20 10/16/20  [name], NP    Allergies    Ibuprofen, Labetalol, and Aspirin  Review of Systems   Review of Systems  Constitutional:  Negative for chills and fever.  Musculoskeletal:  Positive for arthralgias and joint swelling.  Skin:  Positive for color change.   Neurological:  Negative for numbness.   Physical Exam Updated Vital Signs BP (!) 187/113 (BP Location: Left Arm)   Pulse 88   Temp 98.4 F (36.9 C) (Oral)   Resp 20   Ht '5\' 5"'  (1.651 m)   Wt 113.4 kg   LMP 09/13/2011   SpO2 98%   BMI 41.60 kg/m   Physical Exam Vitals and nursing note reviewed.  Constitutional:      General: She is not in acute distress.    Appearance: She is not ill-appearing.  HENT:     Head: Normocephalic.  Eyes:     Pupils: Pupils are equal, round, and reactive to light.  Cardiovascular:     Rate and Rhythm: Normal rate and regular rhythm.     Pulses: Normal pulses.     Heart sounds: Normal heart sounds. No murmur heard.   No friction rub. No gallop.  Pulmonary:     Effort: Pulmonary effort is normal.     Breath sounds: Normal breath sounds.  Abdominal:     General: Abdomen is flat. There is no distension.     Palpations: Abdomen is soft.     Tenderness: There is no abdominal tenderness. There is no guarding or rebound.  Musculoskeletal:        General: Normal range of motion.     Cervical back: Neck supple.     Comments: TTP over palmar aspect of right fourth and fifth metacarpals.  Decreased range of motion of all fingers due to pain.  Surrounding edema.  Skin:    General: Skin is warm and dry.     Comments: Erythematous area over palmar aspect of right fourth and fifth metacarpals.  No fluctuance.  Neurological:     General: No focal deficit present.     Mental Status: She is alert.  Psychiatric:        Mood and Affect: Mood normal.        Behavior: Behavior normal.    ED Results / Procedures / Treatments   Labs (all labs ordered are listed, but only abnormal results are displayed) Labs Reviewed - No data to display  EKG None  Radiology DG Hand Complete Right  Result Date: 07/02/2021 CLINICAL DATA:  Infection EXAM:  RIGHT HAND - COMPLETE 3+ VIEW COMPARISON:  None. FINDINGS: There is no evidence of fracture or dislocation. There is  no evidence of arthropathy or other focal bone abnormality. Soft tissues are unremarkable. IMPRESSION: Negative. Electronically Signed   By: Nelson Chimes M.D.   On: 07/02/2021 12:25    Procedures Procedures   Medications Ordered in ED Medications  acetaminophen (TYLENOL) tablet 650 mg (650 mg Oral Given 07/02/21 1222)    ED Course  I have reviewed the triage vital signs and the nursing notes.  Pertinent labs & imaging results that were available during my care of the patient were reviewed by me and considered in my medical decision making (see chart for details).    MDM Rules/Calculators/A&P                           45 year old female presents to the ED due to right hand swelling x4 days.  Upon arrival, patient afebrile, tachycardic at 110 however, during my initial evaluation patient's heart rate in the 90s.  Patient denies fever and chills.  Patient states she has a history of hidradenitis suppurativa and gets frequent abscesses.  No recent injury.  Area of induration and erythema to right fourth and fifth metacarpals.  No fluctuance.  No drainable abscess.  X-ray negative for any acute abnormalities.  Patient started on antibiotics.  Advised patient to have wound rechecked by PCP in 2 days.  Low suspicion for flexor tenosynovitis. Low suspicion for septic joint. Patient's BP elevated; however, patient currently asymptomatic. Low suspicion for hypertensive urgency/emergency. Advised patient to have BP rechecked within 1 week. Strict ED precautions discussed with patient. Patient states understanding and agrees to plan. Patient discharged home in no acute distress and stable vitals.  Final Clinical Impression(s) / ED Diagnoses Final diagnoses:  Right hand pain  Elevated blood pressure reading    Rx / DC Orders ED Discharge Orders          Ordered    doxycycline (VIBRAMYCIN) 100 MG capsule  2 times daily        07/02/21 1308             Karie Kirks 07/02/21  1311    Isla Pence, MD 07/02/21 1323

## 2021-07-02 NOTE — ED Notes (Signed)
Pt states she has not taken her antihypertensive meds today. Usually takes her meds around 9am. But has not taken today

## 2021-07-04 ENCOUNTER — Ambulatory Visit (INDEPENDENT_AMBULATORY_CARE_PROVIDER_SITE_OTHER): Payer: No Typology Code available for payment source | Admitting: Family

## 2021-07-04 ENCOUNTER — Other Ambulatory Visit (HOSPITAL_BASED_OUTPATIENT_CLINIC_OR_DEPARTMENT_OTHER): Payer: Self-pay

## 2021-07-04 ENCOUNTER — Other Ambulatory Visit: Payer: Self-pay

## 2021-07-04 ENCOUNTER — Other Ambulatory Visit: Payer: Self-pay | Admitting: Family

## 2021-07-04 ENCOUNTER — Encounter: Payer: Self-pay | Admitting: Family

## 2021-07-04 VITALS — BP 134/92 | HR 102 | Temp 98.9°F | Resp 20 | Ht 65.0 in | Wt 256.4 lb

## 2021-07-04 DIAGNOSIS — Z794 Long term (current) use of insulin: Secondary | ICD-10-CM | POA: Diagnosis not present

## 2021-07-04 DIAGNOSIS — L0390 Cellulitis, unspecified: Secondary | ICD-10-CM

## 2021-07-04 DIAGNOSIS — I159 Secondary hypertension, unspecified: Secondary | ICD-10-CM

## 2021-07-04 DIAGNOSIS — Z23 Encounter for immunization: Secondary | ICD-10-CM

## 2021-07-04 DIAGNOSIS — E1165 Type 2 diabetes mellitus with hyperglycemia: Secondary | ICD-10-CM | POA: Diagnosis not present

## 2021-07-04 DIAGNOSIS — E785 Hyperlipidemia, unspecified: Secondary | ICD-10-CM

## 2021-07-04 DIAGNOSIS — G4733 Obstructive sleep apnea (adult) (pediatric): Secondary | ICD-10-CM | POA: Diagnosis not present

## 2021-07-04 DIAGNOSIS — F17200 Nicotine dependence, unspecified, uncomplicated: Secondary | ICD-10-CM

## 2021-07-04 LAB — LDL CHOLESTEROL, DIRECT: Direct LDL: 171 mg/dL

## 2021-07-04 LAB — COMPREHENSIVE METABOLIC PANEL
ALT: 11 U/L (ref 0–35)
AST: 10 U/L (ref 0–37)
Albumin: 3 g/dL — ABNORMAL LOW (ref 3.5–5.2)
Alkaline Phosphatase: 106 U/L (ref 39–117)
BUN: 16 mg/dL (ref 6–23)
CO2: 30 mEq/L (ref 19–32)
Calcium: 9.1 mg/dL (ref 8.4–10.5)
Chloride: 95 mEq/L — ABNORMAL LOW (ref 96–112)
Creatinine, Ser: 0.85 mg/dL (ref 0.40–1.20)
GFR: 82.72 mL/min (ref >60.00)
Glucose, Bld: 355 mg/dL — ABNORMAL HIGH (ref 70–99)
Potassium: 3.8 mEq/L (ref 3.5–5.1)
Sodium: 133 mEq/L — ABNORMAL LOW (ref 135–145)
Total Bilirubin: 0.3 mg/dL (ref 0.2–1.2)
Total Protein: 6.5 g/dL (ref 6.0–8.3)

## 2021-07-04 LAB — LIPID PANEL
Cholesterol: 250 mg/dL — ABNORMAL HIGH (ref 0–200)
HDL: 42.8 mg/dL (ref >39.00)
NonHDL: 207.11
Total CHOL/HDL Ratio: 6
Triglycerides: 239 mg/dL — ABNORMAL HIGH (ref 0.0–149.0)
VLDL: 47.8 mg/dL — ABNORMAL HIGH (ref 0.0–40.0)

## 2021-07-04 LAB — CBC WITH DIFFERENTIAL/PLATELET
Basophils Absolute: 0.1 10*3/uL (ref 0.0–0.1)
Basophils Relative: 1 % (ref 0.0–3.0)
Eosinophils Absolute: 0.2 10*3/uL (ref 0.0–0.7)
Eosinophils Relative: 2.4 % (ref 0.0–5.0)
HCT: 44.6 % (ref 36.0–46.0)
Hemoglobin: 14.9 g/dL (ref 12.0–15.0)
Lymphocytes Relative: 28.2 % (ref 12.0–46.0)
Lymphs Abs: 2.3 10*3/uL (ref 0.7–4.0)
MCHC: 33.4 g/dL (ref 30.0–36.0)
MCV: 89.9 fl (ref 78.0–100.0)
Monocytes Absolute: 0.5 10*3/uL (ref 0.1–1.0)
Monocytes Relative: 6.4 % (ref 3.0–12.0)
Neutro Abs: 5.2 10*3/uL (ref 1.4–7.7)
Neutrophils Relative %: 62 % (ref 43.0–77.0)
Platelets: 287 10*3/uL (ref 150.0–400.0)
RBC: 4.96 Mil/uL (ref 3.87–5.11)
RDW: 14.2 % (ref 11.5–15.5)
WBC: 8.3 10*3/uL (ref 4.0–10.5)

## 2021-07-04 LAB — HEMOGLOBIN A1C: Hgb A1c MFr Bld: 10.7 % — ABNORMAL HIGH (ref 4.6–6.5)

## 2021-07-04 MED ORDER — FREESTYLE LIBRE 2 SENSOR MISC
5 refills | Status: DC
  Filled 2021-07-04: qty 2, 28d supply, fill #0
  Filled 2021-08-02: qty 2, 28d supply, fill #1
  Filled 2021-09-06: qty 2, 28d supply, fill #2

## 2021-07-04 MED ORDER — TRAMADOL HCL 50 MG PO TABS
50.0000 mg | ORAL_TABLET | Freq: Three times a day (TID) | ORAL | 0 refills | Status: DC | PRN
  Filled 2021-07-04: qty 20, 7d supply, fill #0

## 2021-07-04 NOTE — Progress Notes (Signed)
Subjective:     Patient ID: Catherine Espinoza, female    DOB: Jan 31, 1976, 45 y.o.   MRN: [id number]  Chief Complaint  Patient presents with   ED visit follow up    Pt states still having right hand pain. Pt states no falls or injuries.     HPI Patient is in today for ED follow up. She was evaluated in the ED on 10/3 due to right sided hand pain. X-ray was negative and she was placed on empiric doxycycline. Started 2.5 weeks ago in the right thumb.  Feels ver swollen inbetween the 4th and 5th. She notes some improvement in her swelling since she began the abx.    DM2- DM meds include tresiba 40 units daily and farxiga 10 mg.  Lab Results  Component Value Date   HGBA1C 10.7 (H) 07/04/2021   HGBA1C 13.5 (A) 10/04/2020   HGBA1C 9.6 Repeated and verified X2. (H) 03/15/2020   Lab Results  Component Value Date   MICROALBUR 6.6 (H) 06/21/2016   LDLCALC 134 (H) 10/24/2017   CREATININE 0.85 07/04/2021   Tobacco abuse-still smoking.    CHF/NICM-She no showed her follow up visit with cardiology on 06/28/21. She is maintained on entresto and lasix prn weight gain.   HTN- on Etresto for CHF, aldactone 10m, coreg 12.5 mg.  BP Readings from Last 3 Encounters:  07/04/21 (!) 134/92  07/02/21 (!) 187/113  07/01/21 (!) 176/101   OSA- She also no showed her consultation for OSA.    Hyperlipidemia- maintained on crestor 469m  Lab Results  Component Value Date   CHOL 250 (H) 07/04/2021   HDL 42.80 07/04/2021   LDLCALC 134 (H) 10/24/2017   LDLDIRECT 171.0 07/04/2021   TRIG 239.0 (H) 07/04/2021   CHOLHDL 6 07/04/2021     Health Maintenance Due  Topic Date Due   Hepatitis C Screening  Never done   OPHTHALMOLOGY EXAM  06/26/2017   FOOT EXAM  02/05/2019   COVID-19 Vaccine (3 - Moderna risk series) 12/17/2019   COLONOSCOPY (Pts [id number] coverage will need to be confirmed)  Never done    Past Medical History:  Diagnosis Date   Anemia, unspecified    CAD (coronary artery  disease)    Chronic left-sided thoracic back pain    Excessive or frequent menstruation    Morbid obesity ([place]  Obstructive sleep apnea 02/11/11   Sleep study: severe OSA- rec CPAP 20cm small  full face mask   Proteinuria    Thoracic radiculopathy    Type II or unspecified type diabetes mellitus without mention of complication, not stated as uncontrolled    Unspecified essential hypertension     Past Surgical History:  Procedure Laterality Date   ABDOMINAL HYSTERECTOMY     ABLATION COLPOCLESIS     CESAREAN SECTION     CLEFT PALATE REPAIR     CORONARY STENT INTERVENTION N/A 10/13/2020   Procedure: CORONARY STENT INTERVENTION;  Surgeon: McABurnell BlanksD;  Location: [facility];  Service: Cardiovascular;  Laterality: N/A;   cyst removal     ECTOPIC PREGNANCY SURGERY     x 2   OOPHORECTOMY Right 2002   RIGHT/LEFT HEART CATH AND CORONARY ANGIOGRAPHY N/A 10/13/2020   Procedure: RIGHT/LEFT HEART CATH AND CORONARY ANGIOGRAPHY;  Surgeon: BenJolaine ArtistD;  Location: [facility];  Service: Cardiovascular;  Laterality: N/A;    Family History  Problem Relation Age of Onset   Heart attack Maternal Aunt  Diabetes Mother    Cancer Father        oral cancer    Social History   Socioeconomic History   Marital status: Single    Spouse name: Not on file   Number of children: 1   Years of education: Not on file   Highest education level: Associate degree: occupational, Hotel manager, or vocational program  Occupational History   Occupation: cna/med Engineer, production: [employer]  Tobacco Use   Smoking status: Every Day    Packs/day: 0.50    Years: 10.00    Pack years: 5.00    Types: Cigarettes   Smokeless tobacco: Never  Vaping Use   Vaping Use: Never used  Substance and Sexual Activity   Alcohol use: Yes    Alcohol/week: 0.0 standard drinks    Comment: occasional use   Drug use: No   Sexual activity: Yes    Birth control/protection: Surgical   Other Topics Concern   Not on file  Social History Narrative   Married-filing for divorced   Daughter born 2003   Works as cna/med Firefighter Determinants of Health   Financial Resource Strain: High Risk   Difficulty of Paying Living Expenses: Hard  Food Insecurity: No Food Insecurity   Worried About Charity fundraiser in the Last Year: Never true   Arboriculturist in the Last Year: Never true  Transportation Needs: No Transportation Needs   Lack of Transportation (Medical): No   Lack of Transportation (Non-Medical): No  Physical Activity: Not on file  Stress: Not on file  Social Connections: Not on file  Intimate Partner Violence: Not At Risk   Fear of Current or Ex-Partner: No   Emotionally Abused: No   Physically Abused: No   Sexually Abused: No    Outpatient Medications Prior to Visit  Medication Sig Dispense Refill   aspirin 81 MG chewable tablet CHEW 1 TABLET (81 MG TOTAL) BY MOUTH DAILY. 30 tablet 2   BAYER MICROLET LANCETS lancets Use as instructed to check blood sugar three times daily.  DX  E11.65 (Patient taking differently: Use as instructed to check blood sugar three times daily.  DX  E11.65) 100 each 5   blood glucose meter kit and supplies KIT Dispense based on patient and insurance preference. Use up to four times daily as directed. (FOR ICD-9 250.00, 250.01). 1 each 0   Blood Glucose Monitoring Suppl (FREESTYLE LITE) w/Device KIT USE UP TO 4 TIMES DAILY 1 kit 0   carvedilol (COREG) 12.5 MG tablet Take 1 & 1/2 tablets (18.75 mg total) by mouth 2 (two) times daily with a meal. 60 tablet 11   clopidogrel (PLAVIX) 75 MG tablet TAKE 1 TABLET (75 MG TOTAL) BY MOUTH DAILY. 30 tablet 2   Continuous Blood Gluc Receiver (FREESTYLE LIBRE 2 READER) DEVI USE AS DIRECTED 1 each 0   Continuous Blood Gluc Sensor (FREESTYLE LIBRE SENSOR SYSTEM) MISC APPLY SENSOR EVERY 14 DAYS 2 each 0   dapagliflozin propanediol (FARXIGA) 10 MG TABS tablet TAKE 1 TABLET (10 MG TOTAL) BY  MOUTH DAILY. 30 tablet 2   doxycycline (VIBRAMYCIN) 100 MG capsule Take 1 capsule (100 mg total) by mouth 2 (two) times daily. 20 capsule 0   furosemide (LASIX) 40 MG tablet TAKE 1 TABLET (40 MG TOTAL) BY MOUTH DAILY. FOR EVERY 3 LB WEIGHT GAIN 30 tablet 6   glucose blood test strip USE UP TO 4 TIMES DAILY 100 strip 0  insulin degludec (TRESIBA) 100 UNIT/ML FlexTouch Pen INJECT UP TO 50 UNITS UNDER THE SKIN DAILY 15 mL 3   Insulin Pen Needle 32G X 4 MM MISC USE AS DIRECTED WITH INSULIN 100 each 0   Insulin Pen Needle 32G X 4 MM MISC USE AS DIRECTED ONCE DAILY 100 each 0   Insulin Syringe 27G X 1/2" 0.5 ML MISC Use as directed 100 each 3   Lancets (FREESTYLE) lancets USE UP TO FOUR TIMES DAILY AS DIRECTED 100 each 0   potassium chloride SA (KLOR-CON) 20 MEQ tablet TAKE 1 TABLET (20 MEQ TOTAL) BY MOUTH DAILY. 30 tablet 6   rosuvastatin (CRESTOR) 40 MG tablet TAKE 1 TABLET (40 MG TOTAL) BY MOUTH DAILY. 30 tablet 2   sacubitril-valsartan (ENTRESTO) 97-103 MG Take 1 tablet by mouth 2 (two) times daily. 90 tablet 2   spironolactone (ALDACTONE) 25 MG tablet TAKE 1 TABLET (25 MG TOTAL) BY MOUTH DAILY. 30 tablet 2   benzonatate (TESSALON) 100 MG capsule Take 1 capsule (100 mg total) by mouth 3 (three) times daily as needed. 30 capsule 0   lidocaine (XYLOCAINE) 2 % solution Use as directed 15 mLs in the mouth or throat every 4 (four) hours as needed for mouth pain. 200 mL 0   promethazine-dextromethorphan (PROMETHAZINE-DM) 6.25-15 MG/5ML syrup Take 5 mLs by mouth at bedtime as needed for cough. 118 mL 0   guaiFENesin (MUCINEX) 600 MG 12 hr tablet Take 1 tablet (600 mg total) by mouth 2 (two) times daily. (Patient not taking: Reported on 07/04/2021) 40 tablet 0   nystatin (MYCOSTATIN) 100000 UNIT/ML suspension Take 5 mLs (500,000 Units total) by mouth 4 (four) times daily. (Patient not taking: Reported on 07/04/2021) 60 mL 0   predniSONE (DELTASONE) 50 MG tablet Take 1 tablet (50 mg total) by mouth daily.  (Patient not taking: Reported on 07/04/2021) 5 tablet 0   No facility-administered medications prior to visit.    Allergies  Allergen Reactions   Ibuprofen     REACTION: knots in mouth with SOB   Labetalol Nausea Only   Aspirin     Knots in mouth     ROS    See HPI Objective:    Physical Exam Constitutional:      General: She is not in acute distress.    Appearance: Normal appearance. She is well-developed.  HENT:     Head: Normocephalic and atraumatic.     Right Ear: External ear normal.     Left Ear: External ear normal.  Eyes:     General: No scleral icterus. Neck:     Thyroid: No thyromegaly.  Cardiovascular:     Rate and Rhythm: Normal rate and regular rhythm.     Heart sounds: Normal heart sounds. No murmur heard. Pulmonary:     Effort: Pulmonary effort is normal. No respiratory distress.     Breath sounds: Normal breath sounds. No wheezing.  Musculoskeletal:        General: Swelling (right hand without erythema) present.     Cervical back: Neck supple.  Skin:    General: Skin is warm and dry.  Neurological:     Mental Status: She is alert and oriented to person, place, and time.  Psychiatric:        Mood and Affect: Mood normal.        Behavior: Behavior normal.        Thought Content: Thought content normal.        Judgment: Judgment normal.  BP (!) 134/92 (BP Location: Left Arm, Patient Position: Sitting, Cuff Size: Normal)   Pulse (!) 102   Temp 98.9 F (37.2 C) (Oral)   Resp 20   Ht '5\' 5"'  (1.651 m)   Wt 256 lb 6.4 oz (116.3 kg)   LMP 09/13/2011   SpO2 96%   BMI 42.67 kg/m  Wt Readings from Last 3 Encounters:  07/04/21 256 lb 6.4 oz (116.3 kg)  07/02/21 250 lb (113.4 kg)  07/01/21 250 lb (113.4 kg)       Assessment & Plan:   Problem List Items Addressed This Visit       Unprioritized   Uncontrolled type 2 diabetes mellitus with hyperglycemia, with long-term current use of insulin ([place])   Relevant Orders   Hemoglobin A1c  (Completed)   Obstructive sleep apnea    Encouraged her to reschedule her appointment with the pulmonary specialist.       Hyperlipidemia    Tolerating crestor 78m.  Obtain follow up lipid panel.       Relevant Orders   Lipid panel (Completed)   Comp Met (CMET) (Completed)   HTN (hypertension)    DBP is a little high but overall much better pressures.  Continue Etresto for CHF, aldactone 275m coreg 12.5 mg.   BP Readings from Last 3 Encounters:  07/04/21 (!) 134/92  07/02/21 (!) 187/113  07/01/21 (!) 176/101         Cellulitis - Primary    Hand. Improving. We discussed that her uncontrolled DM2 pre-disposes her to such infections and that it is very important that she get her diabetes under control. Will continue doxycycline 1001mid and plan a 1 week follow up for re-evaluation.       Relevant Orders   CBC with Differential/Platelet (Completed)   Other Visit Diagnoses     Need for influenza vaccination       Relevant Orders   Flu Vaccine QUAD 51mo51mo(Fluarix, Fluzone & Alfiuria Quad PF) (Completed)       I have discontinued E[place]ka Troeger's benzonatate, promethazine-dextromethorphan, lidocaine, nystatin, guaiFENesin, and predniSONE. I am also having her start on traMADol. Additionally, I am having her maintain her Bayer Microlet Lancets, Insulin Syringe, blood glucose meter kit and supplies, Insulin Pen Needle, insulin degludec, potassium chloride SA, aspirin, FreeStyle Libre 2 Reader, FreeStyle Lite, glucose blood, freestyle, FreeThe Timken Companysulin Pen Needle, furosemide, rosuvastatin, spironolactone, dapagliflozin propanediol, clopidogrel, carvedilol, Entresto, and doxycycline.  Meds ordered this encounter  Medications   traMADol (ULTRAM) 50 MG tablet    Sig: Take 1 tablet (50 mg total) by mouth every 8 (eight) hours as needed for up to 5 days.    Dispense:  20 tablet    Refill:  0    Order Specific Question:   Supervising Provider    Answer:    BLYTPenni [id number]]

## 2021-07-04 NOTE — Patient Instructions (Addendum)
Please call to schedule an appointment with Dr. Skeet Latch in the Hypertension clinic- [phone]. Please call Dr. Juanetta Gosling office to reschedule your appointment for sleep apnea-  [phone] Complete lab work prior to leaving.

## 2021-07-05 ENCOUNTER — Encounter: Payer: Self-pay | Admitting: Family

## 2021-07-05 ENCOUNTER — Other Ambulatory Visit (HOSPITAL_BASED_OUTPATIENT_CLINIC_OR_DEPARTMENT_OTHER): Payer: Self-pay

## 2021-07-05 ENCOUNTER — Telehealth: Payer: Self-pay | Admitting: Family

## 2021-07-05 DIAGNOSIS — Z794 Long term (current) use of insulin: Secondary | ICD-10-CM

## 2021-07-05 DIAGNOSIS — E1165 Type 2 diabetes mellitus with hyperglycemia: Secondary | ICD-10-CM

## 2021-07-05 DIAGNOSIS — E785 Hyperlipidemia, unspecified: Secondary | ICD-10-CM

## 2021-07-05 NOTE — Assessment & Plan Note (Signed)
>>  ASSESSMENT AND PLAN FOR HTN (HYPERTENSION) WRITTEN ON 07/05/2021  1:35 PM BY O'SULLIVAN, Cullen Vanallen, NP  DBP is a little high but overall much better pressures.  Continue Etresto for CHF, aldactone 25mg , coreg 12.5 mg.   BP Readings from Last 3 Encounters:  07/04/21 (!) 134/92  07/02/21 (!) 187/113  07/01/21 (!) 176/101

## 2021-07-05 NOTE — Assessment & Plan Note (Addendum)
Hand. Improving. We discussed that her uncontrolled DM2 pre-disposes her to such infections and that it is very important that she get her diabetes under control. Will continue doxycycline 100mg  bid and plan a 1 week follow up for re-evaluation. Rx provided for tramadol prn pain.

## 2021-07-05 NOTE — Telephone Encounter (Signed)
Also, her cholesterol is very high. Has she missed doses of crestor. If so, please restart.  If not, please let me know.

## 2021-07-05 NOTE — Assessment & Plan Note (Signed)
Tolerating crestor 40mg .  Obtain follow up lipid panel.

## 2021-07-05 NOTE — Telephone Encounter (Signed)
Sugar is very uncontrolled still. I would like for her to see endocrinology for help with sugar control.

## 2021-07-05 NOTE — Assessment & Plan Note (Signed)
Encouraged smoking cessation 

## 2021-07-05 NOTE — Assessment & Plan Note (Signed)
DBP is a little high but overall much better pressures.  Continue Etresto for CHF, aldactone 25mg , coreg 12.5 mg.   BP Readings from Last 3 Encounters:  07/04/21 (!) 134/92  07/02/21 (!) 187/113  07/01/21 (!) 176/101

## 2021-07-05 NOTE — Assessment & Plan Note (Signed)
Encouraged her to reschedule her appointment with the pulmonary specialist.

## 2021-07-06 NOTE — Telephone Encounter (Signed)
Patient advised of results, she reports she has been taking rosuvastatin daily.   She also said ok to precede with endo referral.

## 2021-07-11 ENCOUNTER — Other Ambulatory Visit: Payer: Self-pay

## 2021-07-11 ENCOUNTER — Other Ambulatory Visit (HOSPITAL_BASED_OUTPATIENT_CLINIC_OR_DEPARTMENT_OTHER): Payer: Self-pay

## 2021-07-11 ENCOUNTER — Ambulatory Visit (INDEPENDENT_AMBULATORY_CARE_PROVIDER_SITE_OTHER): Payer: No Typology Code available for payment source | Admitting: Family

## 2021-07-11 VITALS — BP 144/95 | HR 85 | Temp 98.7°F | Resp 16 | Wt 255.0 lb

## 2021-07-11 DIAGNOSIS — L02519 Cutaneous abscess of unspecified hand: Secondary | ICD-10-CM | POA: Diagnosis not present

## 2021-07-11 MED ORDER — DOXYCYCLINE HYCLATE 100 MG PO TABS
100.0000 mg | ORAL_TABLET | Freq: Two times a day (BID) | ORAL | 0 refills | Status: DC
  Filled 2021-07-11: qty 14, 7d supply, fill #0

## 2021-07-11 MED ORDER — TRAMADOL HCL 50 MG PO TABS
50.0000 mg | ORAL_TABLET | Freq: Four times a day (QID) | ORAL | 0 refills | Status: AC | PRN
  Filled 2021-07-11: qty 20, 5d supply, fill #0

## 2021-07-11 MED ORDER — FLUCONAZOLE 150 MG PO TABS
ORAL_TABLET | ORAL | 0 refills | Status: DC
  Filled 2021-07-11: qty 2, 3d supply, fill #0

## 2021-07-11 NOTE — Progress Notes (Signed)
Subjective:     Patient ID: Catherine Espinoza, female    DOB: [birth year], 45 y.o.   MRN: [id number]  Chief Complaint  Patient presents with   Cellulitis    Follow up on cellulitis on right hand    HPI Patient is in today for follow up of the cellulitis on her right hand. She reports that the swelling has improved but she still has significant pain between the 4th and 5th fingers.  Health Maintenance Due  Topic Date Due   Hepatitis C Screening  Never done   OPHTHALMOLOGY EXAM  06/26/2017   FOOT EXAM  02/05/2019   COVID-19 Vaccine (3 - Moderna risk series) 12/17/2019   COLONOSCOPY (Pts 45-37yr Insurance coverage will need to be confirmed)  Never done    Past Medical History:  Diagnosis Date   Anemia, unspecified    CAD (coronary artery disease)    Chronic left-sided thoracic back pain    Excessive or frequent menstruation    [place]    Obstructive sleep apnea 02/11/11   Sleep study: severe OSA- rec CPAP 20cm small  full face mask   Proteinuria    Thoracic radiculopathy    Type II or unspecified type diabetes mellitus without mention of complication, not stated as uncontrolled    Unspecified essential hypertension     Past Surgical History:  Procedure Laterality Date   ABDOMINAL HYSTERECTOMY     ABLATION COLPOCLESIS     CESAREAN SECTION     CLEFT PALATE REPAIR     CORONARY STENT INTERVENTION N/A 10/13/2020   Procedure: CORONARY STENT INTERVENTION;  Surgeon: MBurnell Blanks MD;  [facility];  Service: Cardiovascular;  Laterality: N/A;   cyst removal     ECTOPIC PREGNANCY SURGERY     x 2   OOPHORECTOMY Right 2002   RIGHT/LEFT HEART CATH AND CORONARY ANGIOGRAPHY N/A 10/13/2020   Procedure: RIGHT/LEFT HEART CATH AND CORONARY ANGIOGRAPHY;  Surgeon: BJolaine Artist MD;  [facility];  Service: Cardiovascular;  Laterality: N/A;    Family History  Problem Relation Age of Onset   Heart attack Maternal Aunt     Diabetes Mother    Cancer Father        oral cancer    Social History   Socioeconomic History   Marital status: Single    Spouse name: Not on file   Number of children: 1   Years of education: Not on file   Highest education level: Associate degree: occupational, tHotel manager or vocational program  Occupational History   Occupation: cna/med tEngineer, production [employer]  Tobacco Use   Smoking status: Every Day    Packs/day: 0.50    Years: 10.00    Pack years: 5.00    Types: Cigarettes   Smokeless tobacco: Never  Vaping Use   Vaping Use: Never used  Substance and Sexual Activity   Alcohol use: Yes    Alcohol/week: 0.0 standard drinks    Comment: occasional use   Drug use: No   Sexual activity: Yes    Birth control/protection: Surgical  Other Topics Concern   Not on file  Social History Narrative   Married-filing for divorced   Daughter born 2003   Works as cna/med tFirefighterDeterminants of HRadio broadcast assistantStrain: High Risk   Difficulty of Paying Living Expenses: Hard  Food Insecurity: No Food Insecurity   Worried About RCharity fundraiserin the  Last Year: Never true   Ran Out of Food in the Last Year: Never true  Transportation Needs: No Transportation Needs   Lack of Transportation (Medical): No   Lack of Transportation (Non-Medical): No  Physical Activity: Not on file  Stress: Not on file  Social Connections: Not on file  Intimate Partner Violence: Not At Risk   Fear of Current or Ex-Partner: No   Emotionally Abused: No   Physically Abused: No   Sexually Abused: No    Outpatient Medications Prior to Visit  Medication Sig Dispense Refill   aspirin 81 MG chewable tablet CHEW 1 TABLET (81 MG TOTAL) BY MOUTH DAILY. 30 tablet 2   BAYER MICROLET LANCETS lancets Use as instructed to check blood sugar three times daily.  DX  E11.65 (Patient taking differently: Use as instructed to check blood sugar three times daily.  DX  E11.65) 100 each 5    blood glucose meter kit and supplies KIT Dispense based on patient and insurance preference. Use up to four times daily as directed. (FOR ICD-9 250.00, 250.01). 1 each 0   Blood Glucose Monitoring Suppl (FREESTYLE LITE) w/Device KIT USE UP TO 4 TIMES DAILY 1 kit 0   carvedilol (COREG) 12.5 MG tablet Take 1 & 1/2 tablets (18.75 mg total) by mouth 2 (two) times daily with a meal. 60 tablet 11   clopidogrel (PLAVIX) 75 MG tablet TAKE 1 TABLET (75 MG TOTAL) BY MOUTH DAILY. 30 tablet 2   Continuous Blood Gluc Receiver (FREESTYLE LIBRE 2 READER) DEVI USE AS DIRECTED 1 each 0   Continuous Blood Gluc Sensor (FREESTYLE LIBRE 2 SENSOR) MISC USE AS DIRECTED 2 each 5   Continuous Blood Gluc Sensor (FREESTYLE LIBRE SENSOR SYSTEM) MISC APPLY SENSOR EVERY 14 DAYS 2 each 0   dapagliflozin propanediol (FARXIGA) 10 MG TABS tablet TAKE 1 TABLET (10 MG TOTAL) BY MOUTH DAILY. 30 tablet 2   furosemide (LASIX) 40 MG tablet TAKE 1 TABLET (40 MG TOTAL) BY MOUTH DAILY. FOR EVERY 3 LB WEIGHT GAIN 30 tablet 6   glucose blood test strip USE UP TO 4 TIMES DAILY 100 strip 0   insulin degludec (TRESIBA) 100 UNIT/ML FlexTouch Pen INJECT UP TO 50 UNITS UNDER THE SKIN DAILY 15 mL 3   Insulin Pen Needle 32G X 4 MM MISC USE AS DIRECTED WITH INSULIN 100 each 0   Insulin Pen Needle 32G X 4 MM MISC USE AS DIRECTED ONCE DAILY 100 each 0   Insulin Syringe 27G X 1/2" 0.5 ML MISC Use as directed 100 each 3   Lancets (FREESTYLE) lancets USE UP TO FOUR TIMES DAILY AS DIRECTED 100 each 0   potassium chloride SA (KLOR-CON) 20 MEQ tablet TAKE 1 TABLET (20 MEQ TOTAL) BY MOUTH DAILY. 30 tablet 6   rosuvastatin (CRESTOR) 40 MG tablet TAKE 1 TABLET (40 MG TOTAL) BY MOUTH DAILY. 30 tablet 2   sacubitril-valsartan (ENTRESTO) 97-103 MG Take 1 tablet by mouth 2 (two) times daily. 90 tablet 2   spironolactone (ALDACTONE) 25 MG tablet TAKE 1 TABLET (25 MG TOTAL) BY MOUTH DAILY. 30 tablet 2   doxycycline (VIBRAMYCIN) 100 MG capsule Take 1 capsule (100 mg  total) by mouth 2 (two) times daily. 20 capsule 0   traMADol (ULTRAM) 50 MG tablet Take 1 tablet (50 mg total) by mouth every 8 (eight) hours as needed for up to 5 days. 20 tablet 0   No facility-administered medications prior to visit.    Allergies  Allergen Reactions  Ibuprofen     REACTION: knots in mouth with SOB   Labetalol Nausea Only   Aspirin     Knots in mouth     ROS See HPI    Objective:    Physical Exam Constitutional:      Appearance: Normal appearance.  Skin:    General: Skin is warm and dry.     Comments: Mild swelling noted of right hand.  More discrete round raised abscess noted beneath the skin on the 4th and 5th fingers.  Neurological:     Mental Status: She is alert.    BP (!) 144/95 (BP Location: Left Arm, Patient Position: Sitting, Cuff Size: Large)   Pulse 85   Temp 98.7 F (37.1 C) (Oral)   Resp 16   Wt 255 lb (115.7 kg)   LMP 09/13/2011   SpO2 100%   BMI 42.43 kg/m  Wt Readings from Last 3 Encounters:  07/11/21 255 lb (115.7 kg)  07/04/21 256 lb 6.4 oz (116.3 kg)  07/02/21 250 lb (113.4 kg)       Assessment & Plan:   Problem List Items Addressed This Visit       Unprioritized   Abscess, hand - Primary    Swelling has improved, but I really think she needs an I and D of the abscess. Will restart doxycycline x 7 more days and refer to hand specialist for I and D.       Relevant Orders   Ambulatory referral to Hand Surgery    I have discontinued Wesleigh Petsch's doxycycline. I have also changed her traMADol. Additionally, I am having her start on fluconazole and doxycycline. Lastly, I am having her maintain her Bayer Microlet Lancets, Insulin Syringe, blood glucose meter kit and supplies, Insulin Pen Needle, insulin degludec, potassium chloride SA, aspirin, FreeStyle Libre 2 Reader, FreeStyle Lite, glucose blood, freestyle, [employer], Insulin Pen Needle, furosemide, rosuvastatin, spironolactone, dapagliflozin  propanediol, clopidogrel, carvedilol, Entresto, and [employer] 2 Sensor.  Meds ordered this encounter  Medications   fluconazole (DIFLUCAN) 150 MG tablet    Sig: Take 1 tab by mouth today, then repeat in 3 days if needed.    Dispense:  2 tablet    Refill:  0    Order Specific Question:   Supervising Provider    Answer:   Penni Homans A [[id number]]   doxycycline (VIBRA-TABS) 100 MG tablet    Sig: Take 1 tablet (100 mg total) by mouth 2 (two) times daily.    Dispense:  14 tablet    Refill:  0    Order Specific Question:   Supervising Provider    Answer:   Penni Homans A [[id number]]   traMADol (ULTRAM) 50 MG tablet    Sig: Take 1 tablet (50 mg total) by mouth every 6 (six) hours as needed for up to 5 days.    Dispense:  20 tablet    Refill:  0    Order Specific Question:   Supervising Provider    Answer:   Penni Homans A [[id number]]

## 2021-07-11 NOTE — Patient Instructions (Signed)
Please restart doxycycline. You should be contacted about scheduling your appointment with the hand surgeon.

## 2021-07-11 NOTE — Assessment & Plan Note (Addendum)
Swelling has improved, but I really think she needs an I and D of the abscess. Will restart doxycycline x 7 more days and refer to hand specialist for I and D. Still having significant pain which is also likely raising BP. Will give refill on tramadol short term.

## 2021-07-12 ENCOUNTER — Encounter: Payer: Self-pay | Admitting: Orthopedic Surgery

## 2021-07-12 ENCOUNTER — Other Ambulatory Visit: Payer: Self-pay

## 2021-07-12 ENCOUNTER — Encounter (HOSPITAL_COMMUNITY): Payer: Self-pay | Admitting: Orthopedic Surgery

## 2021-07-12 ENCOUNTER — Ambulatory Visit (INDEPENDENT_AMBULATORY_CARE_PROVIDER_SITE_OTHER): Payer: No Typology Code available for payment source | Admitting: Orthopedic Surgery

## 2021-07-12 ENCOUNTER — Ambulatory Visit (INDEPENDENT_AMBULATORY_CARE_PROVIDER_SITE_OTHER): Payer: No Typology Code available for payment source

## 2021-07-12 VITALS — BP 141/92 | HR 75 | Ht 65.0 in | Wt 259.0 lb

## 2021-07-12 DIAGNOSIS — M79641 Pain in right hand: Secondary | ICD-10-CM

## 2021-07-12 DIAGNOSIS — L02519 Cutaneous abscess of unspecified hand: Secondary | ICD-10-CM | POA: Diagnosis not present

## 2021-07-12 NOTE — H&P (View-Only) (Signed)
Office Visit Note   Patient: Catherine Espinoza           Date of Birth: Jun 15, 1976           MRN: [id number] Visit Date: 07/12/2021              Requested by: [name], NP [address] [place],  [place] [address] PCP: [name], NP   Assessment & Plan: Visit Diagnoses:  1. Pain in right hand   2. Abscess, hand     Plan: Discussed with patient that she has an abscess of her right hand that needs operative debridement.  This appears to be a collar button abscess, however, it has not progressed over the last week since it was noticed.  She thinks that her swelling and pain have improved over the last week.  She has no pain along her flexor tendon sheaths to suggest flexor tenosynovitis.  There is no involvement proximal to the distal palmar crease. We will plan for a formal I&D in the OR tomorrow with intraoperative cultures and planned admission for IV abx.  She has multiple medical comorbidities including CAD s/p stent, CHF, poorly controlled DM (A1C ~10 per patient) so the hospitalist service will be consulted.   Follow-Up Instructions: No follow-ups on file.   Orders:  Orders Placed This Encounter  Procedures   XR Hand Complete Right   No orders of the defined types were placed in this encounter.     Procedures: No procedures performed   Clinical Data: No additional findings.   Subjective: Chief Complaint  Patient presents with   Right Hand - New Patient (Initial Visit)    This is a 45 yo RHD F w/ hx of hidradenitis suppurativa and numerous abscesses who presents with an abscess involving the right fourth web space.  It started with pain and swelling approximately two weeks ago.  She has been on doxycycline.  She thinks her symptoms have actually improved this week.  She denies pain in the palm proximal to the distal palmar crease.  She has no pain into her fingers.  She denies any systemic symptoms. She has DM w/ pt's reported A1c ~10.     Review of Systems   Objective: Vital Signs: BP (!) 141/92 (BP Location: Left Arm, Patient Position: Sitting, Cuff Size: Normal)   Pulse 75   Ht 5\' 5"  (1.651 m)   Wt 259 lb (117.5 kg)   LMP 09/13/2011   SpO2 95%   BMI 43.10 kg/m   Physical Exam Cardiovascular:     Rate and Rhythm: Normal rate.     Pulses: Normal pulses.  Pulmonary:     Effort: Pulmonary effort is normal.  Skin:    General: Skin is warm and dry.     Capillary Refill: Capillary refill takes less than 2 seconds.  Neurological:     Mental Status: She is alert.    Right Hand Exam   Tenderness  Right hand tenderness location: TTP and both dorsal and volar aspects of the fourth web space.  No TTP along flexor tendon sheath of ring or small finger.  No TTP into proximal palm.  Range of Motion  The patient has normal right wrist ROM.   Other  Sensation: normal Pulse: present  Comments:  Focal swelling at both volar and dorsal aspects of the fourth web space.  Ring and small fingers held in abducted position. Erythema at web space.  No pain or swelling into fingers  or proximal palm.      Specialty Comments:  No specialty comments available.  Imaging: 3V of the right hand taken today are reviewed and interpreted by me.  They demonstrate abducted posture of the ring and small fingers.  There does not appear to be any bony involvement.    PMFS History: Patient Active Problem List   Diagnosis Date Noted   Abscess, hand 07/11/2021   CAD (coronary artery disease)    Coronary artery disease involving native coronary artery of native heart with unstable angina pectoris ([place])    Acute CHF (congestive heart failure) ([place]) 10/12/2020   Preventative health care 06/21/2016   GERD (gastroesophageal reflux disease) 12/25/2014   Onychomycosis 08/11/2014   Cellulitis 10/11/2013   HTN (hypertension) 10/11/2013   Hidradenitis suppurativa 08/02/2011   Obstructive sleep apnea 02/15/2011   Hypertension 02/15/2011    Hyperlipidemia 01/16/2011   PROTEINURIA 11/16/2009   Uncontrolled type 2 diabetes mellitus with hyperglycemia, with long-term current use of insulin ([place]) 11/03/2009   ANEMIA 11/03/2009   MORBID OBESITY 10/31/2009   TOBACCO ABUSE 10/31/2009   Past Medical History:  Diagnosis Date   Anemia, unspecified    CAD (coronary artery disease)    Chronic left-sided thoracic back pain    Excessive or frequent menstruation    Morbid obesity ([place])    Obstructive sleep apnea 02/11/2011   Sleep study: severe OSA- rec CPAP 20cm small  full face mask   Proteinuria    Thoracic radiculopathy    Type II or unspecified type diabetes mellitus without mention of complication, not stated as uncontrolled    Unspecified essential hypertension     Family History  Problem Relation Age of Onset   Heart attack Maternal Aunt    Diabetes Mother    Cancer Father        oral cancer    Past Surgical History:  Procedure Laterality Date   ABDOMINAL HYSTERECTOMY     ABLATION COLPOCLESIS     CESAREAN SECTION     CLEFT PALATE REPAIR     CORONARY STENT INTERVENTION N/A 10/13/2020   Procedure: CORONARY STENT INTERVENTION;  Surgeon: Burnell Blanks, MD;  Location: [facility];  Service: Cardiovascular;  Laterality: N/A;   cyst removal     ECTOPIC PREGNANCY SURGERY     x 2   OOPHORECTOMY Right 2002   RIGHT/LEFT HEART CATH AND CORONARY ANGIOGRAPHY N/A 10/13/2020   Procedure: RIGHT/LEFT HEART CATH AND CORONARY ANGIOGRAPHY;  Surgeon: Jolaine Artist, MD;  Location: [facility];  Service: Cardiovascular;  Laterality: N/A;   Social History   Occupational History   Occupation: cna/med Engineer, production: [employer]  Tobacco Use   Smoking status: Every Day    Packs/day: 0.50    Years: 10.00    Pack years: 5.00    Types: Cigarettes   Smokeless tobacco: Never  Vaping Use   Vaping Use: Never used  Substance and Sexual Activity   Alcohol use: Yes    Alcohol/week: 0.0 standard drinks     Comment: occasional use   Drug use: No   Sexual activity: Yes    Birth control/protection: Surgical

## 2021-07-12 NOTE — Progress Notes (Signed)
Office Visit Note   Patient: Catherine Espinoza           Date of Birth: 1976-07-27           MRN: [id number] Visit Date: 07/12/2021              Requested by: [name], NP [address] [place],  [place] [address] PCP: [name], NP   Assessment & Plan: Visit Diagnoses:  1. Pain in right hand   2. Abscess, hand     Plan: Discussed with patient that she has an abscess of her right hand that needs operative debridement.  This appears to be a collar button abscess, however, it has not progressed over the last week since it was noticed.  She thinks that her swelling and pain have improved over the last week.  She has no pain along her flexor tendon sheaths to suggest flexor tenosynovitis.  There is no involvement proximal to the distal palmar crease. We will plan for a formal I&D in the OR tomorrow with intraoperative cultures and planned admission for IV abx.  She has multiple medical comorbidities including CAD s/p stent, CHF, poorly controlled DM (A1C ~10 per patient) so the hospitalist service will be consulted.   Follow-Up Instructions: No follow-ups on file.   Orders:  Orders Placed This Encounter  Procedures   XR Hand Complete Right   No orders of the defined types were placed in this encounter.     Procedures: No procedures performed   Clinical Data: No additional findings.   Subjective: Chief Complaint  Patient presents with   Right Hand - New Patient (Initial Visit)    This is a 45 yo RHD F w/ hx of hidradenitis suppurativa and numerous abscesses who presents with an abscess involving the right fourth web space.  It started with pain and swelling approximately two weeks ago.  She has been on doxycycline.  She thinks her symptoms have actually improved this week.  She denies pain in the palm proximal to the distal palmar crease.  She has no pain into her fingers.  She denies any systemic symptoms. She has DM w/ pt's reported A1c ~10.     Review of Systems   Objective: Vital Signs: BP (!) 141/92 (BP Location: Left Arm, Patient Position: Sitting, Cuff Size: Normal)   Pulse 75   Ht 5\' 5"  (1.651 m)   Wt 259 lb (117.5 kg)   LMP 09/13/2011   SpO2 95%   BMI 43.10 kg/m   Physical Exam Cardiovascular:     Rate and Rhythm: Normal rate.     Pulses: Normal pulses.  Pulmonary:     Effort: Pulmonary effort is normal.  Skin:    General: Skin is warm and dry.     Capillary Refill: Capillary refill takes less than 2 seconds.  Neurological:     Mental Status: She is alert.    Right Hand Exam   Tenderness  Right hand tenderness location: TTP and both dorsal and volar aspects of the fourth web space.  No TTP along flexor tendon sheath of ring or small finger.  No TTP into proximal palm.  Range of Motion  The patient has normal right wrist ROM.   Other  Sensation: normal Pulse: present  Comments:  Focal swelling at both volar and dorsal aspects of the fourth web space.  Ring and small fingers held in abducted position. Erythema at web space.  No pain or swelling into fingers  or proximal palm.      Specialty Comments:  No specialty comments available.  Imaging: 3V of the right hand taken today are reviewed and interpreted by me.  They demonstrate abducted posture of the ring and small fingers.  There does not appear to be any bony involvement.    PMFS History: Patient Active Problem List   Diagnosis Date Noted   Abscess, hand 07/11/2021   CAD (coronary artery disease)    Coronary artery disease involving native coronary artery of native heart with unstable angina pectoris ([place])    Acute CHF (congestive heart failure) ([place]) 10/12/2020   Preventative health care 06/21/2016   GERD (gastroesophageal reflux disease) 12/25/2014   Onychomycosis 08/11/2014   Cellulitis 10/11/2013   HTN (hypertension) 10/11/2013   Hidradenitis suppurativa 08/02/2011   Obstructive sleep apnea 02/15/2011   Hypertension 02/15/2011    Hyperlipidemia 01/16/2011   PROTEINURIA 11/16/2009   Uncontrolled type 2 diabetes mellitus with hyperglycemia, with long-term current use of insulin ([place]) 11/03/2009   ANEMIA 11/03/2009   MORBID OBESITY 10/31/2009   TOBACCO ABUSE 10/31/2009   Past Medical History:  Diagnosis Date   Anemia, unspecified    CAD (coronary artery disease)    Chronic left-sided thoracic back pain    Excessive or frequent menstruation    Morbid obesity ([place])    Obstructive sleep apnea 02/11/2011   Sleep study: severe OSA- rec CPAP 20cm small  full face mask   Proteinuria    Thoracic radiculopathy    Type II or unspecified type diabetes mellitus without mention of complication, not stated as uncontrolled    Unspecified essential hypertension     Family History  Problem Relation Age of Onset   Heart attack Maternal Aunt    Diabetes Mother    Cancer Father        oral cancer    Past Surgical History:  Procedure Laterality Date   ABDOMINAL HYSTERECTOMY     ABLATION COLPOCLESIS     CESAREAN SECTION     CLEFT PALATE REPAIR     CORONARY STENT INTERVENTION N/A 10/13/2020   Procedure: CORONARY STENT INTERVENTION;  Surgeon: Burnell Blanks, MD;  Location: [facility];  Service: Cardiovascular;  Laterality: N/A;   cyst removal     ECTOPIC PREGNANCY SURGERY     x 2   OOPHORECTOMY Right 2002   RIGHT/LEFT HEART CATH AND CORONARY ANGIOGRAPHY N/A 10/13/2020   Procedure: RIGHT/LEFT HEART CATH AND CORONARY ANGIOGRAPHY;  Surgeon: Jolaine Artist, MD;  Location: [facility];  Service: Cardiovascular;  Laterality: N/A;   Social History   Occupational History   Occupation: cna/med Engineer, production: [employer]  Tobacco Use   Smoking status: Every Day    Packs/day: 0.50    Years: 10.00    Pack years: 5.00    Types: Cigarettes   Smokeless tobacco: Never  Vaping Use   Vaping Use: Never used  Substance and Sexual Activity   Alcohol use: Yes    Alcohol/week: 0.0 standard drinks     Comment: occasional use   Drug use: No   Sexual activity: Yes    Birth control/protection: Surgical

## 2021-07-12 NOTE — Progress Notes (Signed)
PCP - [name], NP Cardiologist - Dr. Haroldine Laws  EKG - 10/05/20 Chest x-ray -  ECHO - 03/12/21 Cardiac Cath - 10/13/20 CPAP - pt has a very old CPAP machine and PCP is currently working on doing a new sleep study and get pt fitted for a new CPAP - per pt so she is not currently wearing her old CPAP   Fasting Blood Sugar:  150s Checks Blood Sugar:  Continuous CBG monitor on L arm   Blood Thinner Instructions: per pt she will not take plavix or ASA on DOS -- Follow your surgeon's instructions on when to stop.  If no instructions were given by your surgeon then you will need to call the office to get those instructions.    Aspirin Instructions:   ERAS Protcol - n/a - clears until 1230  COVID TEST- DOS  Anesthesia review: n/a  -------------  SDW INSTRUCTIONS:  Your procedure is scheduled on Friday 10/14. Please report to [facility]tr[facility] "A" at 1230 PM., and check in at the Admitting office. Call this number if you have problems the morning of surgery: [phone]   Remember: Do not eat after midnight the night before your surgery  You may drink clear liquids until 12:30 the day of your surgery.   Clear liquids allowed are: Water, Non-Citrus Juices (without pulp), Carbonated Beverages, Clear Tea, Black Coffee Only, and Gatorade   Medications to take morning of surgery with a sip of water include: carvedilol (COREG) doxycycline (VIBRA-TABS)  rosuvastatin (CRESTOR)  If needed: acetaminophen (TYLENOL)   ** PLEASE check your blood sugar the morning of your surgery when you wake up and every 2 hours until you get to the Short Stay unit.  If your blood sugar is less than 70 mg/dL, you will need to treat for low blood sugar: Do not take insulin. Treat a low blood sugar (less than 70 mg/dL) with  cup of clear juice (cranberry or apple), 4 glucose tablets, OR glucose gel. Recheck blood sugar in 15 minutes after treatment (to make sure it is greater than 70 mg/dL). If  your blood sugar is not greater than 70 mg/dL on recheck, call [phone] for further instructions.  dapagliflozin propanediol (FARXIGA)  10/13: none 10/14: none  insulin degludec (TRESIBA) 10/13: 40 units 10/14: 20 units   As of today, STOP taking any clopidogrel (PLAVIX), Aspirin (unless otherwise instructed by your surgeon), Aleve, Naproxen, Ibuprofen, Motrin, Advil, Goody's, BC's, all herbal medications, fish oil, and all vitamins.    The Morning of Surgery Do not wear jewelry, make-up or nail polish. Do not wear lotions, powders, or perfumes, or deodorant Do not shave 48 hours prior to surgery.    Do not bring valuables to the hospital. [facility] is not responsible for any belongings or valuables.  If you are a smoker, DO NOT Smoke 24 hours prior to surgery  If you wear a CPAP at night please bring your mask the morning of surgery   Remember that you must have someone to transport you home after your surgery, and remain with you for 24 hours if you are discharged the same day.  Please bring cases for contacts, glasses, hearing aids, dentures or bridgework because it cannot be worn into surgery.   Patients discharged the day of surgery will not be allowed to drive home.   Please shower the NIGHT BEFORE/MORNING OF SURGERY (use antibacterial soap like DIAL soap if possible). Wear comfortable clothes the morning of surgery. Oral Hygiene is also  important to reduce your risk of infection.  Remember - BRUSH YOUR TEETH THE MORNING OF SURGERY WITH YOUR REGULAR TOOTHPASTE  Patient denies shortness of breath, fever, cough and chest pain.

## 2021-07-13 ENCOUNTER — Observation Stay (HOSPITAL_COMMUNITY): Payer: No Typology Code available for payment source | Admitting: Anesthesiology

## 2021-07-13 ENCOUNTER — Other Ambulatory Visit: Payer: Self-pay

## 2021-07-13 ENCOUNTER — Encounter (HOSPITAL_COMMUNITY)
Admission: RE | Disposition: A | Discharge status: Home / Self Care | Payer: Self-pay | Source: Home / Self Care | Attending: Orthopedic Surgery

## 2021-07-13 ENCOUNTER — Encounter (HOSPITAL_COMMUNITY): Payer: Self-pay | Admitting: Orthopedic Surgery

## 2021-07-13 ENCOUNTER — Observation Stay (HOSPITAL_COMMUNITY)
Admission: RE | Admit: 2021-07-13 | Discharge: 2021-07-14 | Disposition: A | Discharge status: Home / Self Care | Payer: No Typology Code available for payment source | Attending: Orthopedic Surgery | Admitting: Orthopedic Surgery

## 2021-07-13 DIAGNOSIS — I509 Heart failure, unspecified: Secondary | ICD-10-CM | POA: Diagnosis not present

## 2021-07-13 DIAGNOSIS — L02511 Cutaneous abscess of right hand: Secondary | ICD-10-CM | POA: Diagnosis not present

## 2021-07-13 DIAGNOSIS — E119 Type 2 diabetes mellitus without complications: Secondary | ICD-10-CM | POA: Insufficient documentation

## 2021-07-13 DIAGNOSIS — L02531 Carbuncle of right hand: Principal | ICD-10-CM | POA: Insufficient documentation

## 2021-07-13 DIAGNOSIS — I110 Hypertensive heart disease with heart failure: Secondary | ICD-10-CM | POA: Diagnosis not present

## 2021-07-13 DIAGNOSIS — F17210 Nicotine dependence, cigarettes, uncomplicated: Secondary | ICD-10-CM | POA: Diagnosis not present

## 2021-07-13 DIAGNOSIS — L02519 Cutaneous abscess of unspecified hand: Secondary | ICD-10-CM | POA: Diagnosis not present

## 2021-07-13 DIAGNOSIS — Z20822 Contact with and (suspected) exposure to covid-19: Secondary | ICD-10-CM | POA: Insufficient documentation

## 2021-07-13 DIAGNOSIS — I2510 Atherosclerotic heart disease of native coronary artery without angina pectoris: Secondary | ICD-10-CM | POA: Diagnosis not present

## 2021-07-13 HISTORY — DX: Cutaneous abscess of right hand: L02.511

## 2021-07-13 HISTORY — PX: I & D EXTREMITY: SHX5045

## 2021-07-13 LAB — GLUCOSE, CAPILLARY
Glucose-Capillary: 160 mg/dL — ABNORMAL HIGH (ref 70–99)
Glucose-Capillary: 134 mg/dL — ABNORMAL HIGH (ref 70–99)
Glucose-Capillary: 497 mg/dL — ABNORMAL HIGH (ref 70–99)

## 2021-07-13 LAB — SARS CORONAVIRUS 2 BY RT PCR (HOSPITAL ORDER, PERFORMED IN ~~LOC~~ HOSPITAL LAB): SARS Coronavirus 2: NEGATIVE

## 2021-07-13 SURGERY — IRRIGATION AND DEBRIDEMENT EXTREMITY
Anesthesia: Monitor Anesthesia Care | Site: Hand | Laterality: Right

## 2021-07-13 MED ORDER — BUPIVACAINE HCL (PF) 0.5 % IJ SOLN
INTRAMUSCULAR | Status: DC | PRN
  Administered 2021-07-13: 25 mL via PERINEURAL

## 2021-07-13 MED ORDER — DAPAGLIFLOZIN PROPANEDIOL 10 MG PO TABS
10.0000 mg | ORAL_TABLET | Freq: Every day | ORAL | Status: DC
  Administered 2021-07-13 – 2021-07-14 (×2): 10 mg via ORAL
  Filled 2021-07-13 (×2): qty 1

## 2021-07-13 MED ORDER — FENTANYL CITRATE (PF) 250 MCG/5ML IJ SOLN
INTRAMUSCULAR | Status: AC
  Filled 2021-07-13: qty 5

## 2021-07-13 MED ORDER — CEFAZOLIN SODIUM-DEXTROSE 2-4 GM/100ML-% IV SOLN
INTRAVENOUS | Status: AC
  Filled 2021-07-13: qty 100

## 2021-07-13 MED ORDER — MIDAZOLAM HCL 2 MG/2ML IJ SOLN
INTRAMUSCULAR | Status: AC
  Administered 2021-07-13: 2 mg via INTRAVENOUS
  Filled 2021-07-13: qty 2

## 2021-07-13 MED ORDER — ROSUVASTATIN CALCIUM 20 MG PO TABS
40.0000 mg | ORAL_TABLET | Freq: Every day | ORAL | Status: DC
  Administered 2021-07-14: 40 mg via ORAL
  Filled 2021-07-13: qty 2

## 2021-07-13 MED ORDER — FAMOTIDINE 20 MG PO TABS: 20.0000 mg | ORAL_TABLET | Freq: Two times a day (BID) | ORAL | Status: DC | PRN

## 2021-07-13 MED ORDER — CARVEDILOL 6.25 MG PO TABS
18.7500 mg | ORAL_TABLET | Freq: Two times a day (BID) | ORAL | Status: DC
  Administered 2021-07-13 – 2021-07-14 (×2): 18.75 mg via ORAL
  Filled 2021-07-13 (×2): qty 1

## 2021-07-13 MED ORDER — ONDANSETRON HCL 4 MG PO TABS: 4.0000 mg | ORAL_TABLET | Freq: Four times a day (QID) | ORAL | Status: DC | PRN

## 2021-07-13 MED ORDER — INSULIN ASPART 100 UNIT/ML IJ SOLN
3.0000 [IU] | Freq: Three times a day (TID) | INTRAMUSCULAR | Status: DC
  Administered 2021-07-14: 3 [IU] via SUBCUTANEOUS

## 2021-07-13 MED ORDER — CHLORHEXIDINE GLUCONATE 0.12 % MT SOLN: 15.0000 mL | Freq: Once | OROMUCOSAL | Status: AC

## 2021-07-13 MED ORDER — INSULIN ASPART 100 UNIT/ML IJ SOLN
0.0000 [IU] | Freq: Three times a day (TID) | INTRAMUSCULAR | Status: DC
  Administered 2021-07-14: 8 [IU] via SUBCUTANEOUS

## 2021-07-13 MED ORDER — OXYCODONE HCL 5 MG PO TABS: 5.0000 mg | ORAL_TABLET | Freq: Once | ORAL | Status: DC | PRN

## 2021-07-13 MED ORDER — SPIRONOLACTONE 25 MG PO TABS
25.0000 mg | ORAL_TABLET | Freq: Every day | ORAL | Status: DC
  Administered 2021-07-13 – 2021-07-14 (×2): 25 mg via ORAL
  Filled 2021-07-13 (×2): qty 1

## 2021-07-13 MED ORDER — ACETAMINOPHEN 500 MG PO TABS
1000.0000 mg | ORAL_TABLET | Freq: Four times a day (QID) | ORAL | Status: DC
  Administered 2021-07-13 (×2): 1000 mg via ORAL
  Filled 2021-07-13 (×3): qty 2

## 2021-07-13 MED ORDER — FENTANYL CITRATE (PF) 100 MCG/2ML IJ SOLN
INTRAMUSCULAR | Status: AC
  Administered 2021-07-13: 100 ug via INTRAVENOUS
  Filled 2021-07-13: qty 2

## 2021-07-13 MED ORDER — LACTATED RINGERS IV SOLN: INTRAVENOUS | Status: DC | PRN

## 2021-07-13 MED ORDER — PROMETHAZINE HCL 25 MG/ML IJ SOLN: 6.2500 mg | INTRAMUSCULAR | Status: DC | PRN

## 2021-07-13 MED ORDER — ACETAMINOPHEN 500 MG PO TABS
1000.0000 mg | ORAL_TABLET | Freq: Once | ORAL | Status: AC
  Administered 2021-07-13: 1000 mg via ORAL
  Filled 2021-07-13: qty 2

## 2021-07-13 MED ORDER — FUROSEMIDE 40 MG PO TABS
40.0000 mg | ORAL_TABLET | Freq: Every day | ORAL | Status: DC
  Filled 2021-07-13 (×2): qty 1

## 2021-07-13 MED ORDER — ORAL CARE MOUTH RINSE: 15.0000 mL | Freq: Once | OROMUCOSAL | Status: AC

## 2021-07-13 MED ORDER — ASPIRIN 81 MG PO CHEW
81.0000 mg | CHEWABLE_TABLET | Freq: Every day | ORAL | Status: DC
  Administered 2021-07-13 – 2021-07-14 (×2): 81 mg via ORAL
  Filled 2021-07-13 (×2): qty 1

## 2021-07-13 MED ORDER — BUPROPION HCL ER (SR) 150 MG PO TB12
150.0000 mg | ORAL_TABLET | Freq: Every day | ORAL | Status: DC
  Filled 2021-07-13 (×2): qty 1

## 2021-07-13 MED ORDER — LACTATED RINGERS IV SOLN: INTRAVENOUS | Status: DC

## 2021-07-13 MED ORDER — SODIUM CHLORIDE 0.9 % IR SOLN
Status: DC | PRN
  Administered 2021-07-13: 1000 mL

## 2021-07-13 MED ORDER — PROPOFOL 1000 MG/100ML IV EMUL
INTRAVENOUS | Status: AC
  Filled 2021-07-13: qty 100

## 2021-07-13 MED ORDER — NICOTINE 7 MG/24HR TD PT24
7.0000 mg | MEDICATED_PATCH | Freq: Every day | TRANSDERMAL | Status: DC
  Administered 2021-07-13 – 2021-07-14 (×2): 7 mg via TRANSDERMAL
  Filled 2021-07-13 (×2): qty 1

## 2021-07-13 MED ORDER — DIPHENHYDRAMINE HCL 25 MG PO CAPS: 25.0000 mg | ORAL_CAPSULE | Freq: Four times a day (QID) | ORAL | Status: DC | PRN

## 2021-07-13 MED ORDER — HYDROMORPHONE HCL 1 MG/ML IJ SOLN: 0.2500 mg | INTRAMUSCULAR | Status: DC | PRN

## 2021-07-13 MED ORDER — CLOPIDOGREL BISULFATE 75 MG PO TABS
75.0000 mg | ORAL_TABLET | Freq: Every day | ORAL | Status: DC
  Administered 2021-07-13 – 2021-07-14 (×2): 75 mg via ORAL
  Filled 2021-07-13 (×2): qty 1

## 2021-07-13 MED ORDER — CEFAZOLIN SODIUM-DEXTROSE 2-4 GM/100ML-% IV SOLN
2.0000 g | Freq: Three times a day (TID) | INTRAVENOUS | Status: DC
  Administered 2021-07-13 – 2021-07-14 (×2): 2 g via INTRAVENOUS
  Filled 2021-07-13 (×4): qty 100

## 2021-07-13 MED ORDER — OXYCODONE HCL 5 MG/5ML PO SOLN: 5.0000 mg | Freq: Once | ORAL | Status: DC | PRN

## 2021-07-13 MED ORDER — FENTANYL CITRATE (PF) 100 MCG/2ML IJ SOLN: 100.0000 ug | Freq: Once | INTRAMUSCULAR | Status: AC

## 2021-07-13 MED ORDER — FENTANYL CITRATE (PF) 100 MCG/2ML IJ SOLN
INTRAMUSCULAR | Status: DC | PRN
  Administered 2021-07-13 (×2): 25 ug via INTRAVENOUS

## 2021-07-13 MED ORDER — ONDANSETRON HCL 4 MG/2ML IJ SOLN: 4.0000 mg | Freq: Four times a day (QID) | INTRAMUSCULAR | Status: DC | PRN

## 2021-07-13 MED ORDER — AMISULPRIDE (ANTIEMETIC) 5 MG/2ML IV SOLN: 10.0000 mg | Freq: Once | INTRAVENOUS | Status: DC | PRN

## 2021-07-13 MED ORDER — PROPOFOL 10 MG/ML IV BOLUS
INTRAVENOUS | Status: DC | PRN
  Administered 2021-07-13: 100 ug/kg/min via INTRAVENOUS

## 2021-07-13 MED ORDER — OXYCODONE HCL 5 MG PO TABS: 5.0000 mg | ORAL_TABLET | ORAL | Status: DC | PRN

## 2021-07-13 MED ORDER — MIDAZOLAM HCL 2 MG/2ML IJ SOLN: 2.0000 mg | Freq: Once | INTRAMUSCULAR | Status: AC

## 2021-07-13 MED ORDER — SACUBITRIL-VALSARTAN 97-103 MG PO TABS
1.0000 | ORAL_TABLET | Freq: Two times a day (BID) | ORAL | Status: DC
  Administered 2021-07-13 – 2021-07-14 (×2): 1 via ORAL
  Filled 2021-07-13 (×3): qty 1

## 2021-07-13 MED ORDER — MIDAZOLAM HCL 2 MG/2ML IJ SOLN
INTRAMUSCULAR | Status: AC
  Filled 2021-07-13: qty 2

## 2021-07-13 MED ORDER — DEXAMETHASONE SODIUM PHOSPHATE 10 MG/ML IJ SOLN
INTRAMUSCULAR | Status: DC | PRN
  Administered 2021-07-13: 10 mg

## 2021-07-13 MED ORDER — INSULIN ASPART 100 UNIT/ML IJ SOLN
2.0000 [IU] | Freq: Once | INTRAMUSCULAR | Status: AC
  Administered 2021-07-13: 2 [IU] via SUBCUTANEOUS

## 2021-07-13 MED ORDER — PROPOFOL 10 MG/ML IV BOLUS
INTRAVENOUS | Status: AC
  Filled 2021-07-13: qty 20

## 2021-07-13 MED ORDER — INSULIN GLARGINE-YFGN 100 UNIT/ML ~~LOC~~ SOLN
50.0000 [IU] | Freq: Every day | SUBCUTANEOUS | Status: DC
  Administered 2021-07-14: 50 [IU] via SUBCUTANEOUS
  Filled 2021-07-13 (×2): qty 0.5

## 2021-07-13 MED ORDER — CHLORHEXIDINE GLUCONATE 0.12 % MT SOLN
OROMUCOSAL | Status: AC
  Administered 2021-07-13: 15 mL via OROMUCOSAL
  Filled 2021-07-13: qty 15

## 2021-07-13 MED ORDER — BUPIVACAINE HCL (PF) 0.25 % IJ SOLN
INTRAMUSCULAR | Status: AC
  Filled 2021-07-13: qty 30

## 2021-07-13 MED ORDER — CEFAZOLIN SODIUM-DEXTROSE 2-3 GM-%(50ML) IV SOLR
INTRAVENOUS | Status: DC | PRN
  Administered 2021-07-13: 2 g via INTRAVENOUS

## 2021-07-13 SURGICAL SUPPLY — 32 items
BAG COUNTER SPONGE SURGICOUNT (BAG) ×2 IMPLANT
BNDG ELASTIC 4X5.8 VLCR STR LF (GAUZE/BANDAGES/DRESSINGS) ×2 IMPLANT
BNDG GAUZE ELAST 4 BULKY (GAUZE/BANDAGES/DRESSINGS) ×2 IMPLANT
CORD BIPOLAR FORCEPS 12FT (ELECTRODE) ×2 IMPLANT
COVER SURGICAL LIGHT HANDLE (MISCELLANEOUS) ×2 IMPLANT
CUFF TOURN SGL QUICK 18X4 (TOURNIQUET CUFF) ×2 IMPLANT
GAUZE 4X4 16PLY ~~LOC~~+RFID DBL (SPONGE) ×2 IMPLANT
GAUZE PACKING IODOFORM 1/4X15 (PACKING) ×2 IMPLANT
GAUZE SPONGE 4X4 12PLY STRL (GAUZE/BANDAGES/DRESSINGS) ×2 IMPLANT
GAUZE XEROFORM 1X8 LF (GAUZE/BANDAGES/DRESSINGS) ×2 IMPLANT
GLOVE SURG ENC TEXT LTX SZ8 (GLOVE) ×2 IMPLANT
GLOVE SURG MICRO LTX SZ8 (GLOVE) ×2 IMPLANT
GOWN STRL REUS W/ TWL LRG LVL3 (GOWN DISPOSABLE) ×1 IMPLANT
GOWN STRL REUS W/ TWL XL LVL3 (GOWN DISPOSABLE) ×2 IMPLANT
GOWN STRL REUS W/TWL LRG LVL3 (GOWN DISPOSABLE) ×1
GOWN STRL REUS W/TWL XL LVL3 (GOWN DISPOSABLE) ×2
KIT BASIN OR (CUSTOM PROCEDURE TRAY) ×2 IMPLANT
KIT TURNOVER KIT B (KITS) ×2 IMPLANT
NEEDLE HYPO 25GX1X1/2 BEV (NEEDLE) ×2 IMPLANT
NS IRRIG 1000ML POUR BTL (IV SOLUTION) ×2 IMPLANT
PACK ORTHO EXTREMITY (CUSTOM PROCEDURE TRAY) ×2 IMPLANT
PAD ARMBOARD 7.5X6 YLW CONV (MISCELLANEOUS) ×2 IMPLANT
SET CYSTO W/LG BORE CLAMP LF (SET/KITS/TRAYS/PACK) ×2 IMPLANT
SPONGE T-LAP 4X18 ~~LOC~~+RFID (SPONGE) ×2 IMPLANT
SWAB COLLECTION DEVICE MRSA (MISCELLANEOUS) ×4 IMPLANT
SWAB CULTURE ESWAB REG 1ML (MISCELLANEOUS) ×4 IMPLANT
SYR CONTROL 10ML LL (SYRINGE) ×2 IMPLANT
TOWEL GREEN STERILE (TOWEL DISPOSABLE) ×2 IMPLANT
TOWEL GREEN STERILE FF (TOWEL DISPOSABLE) ×2 IMPLANT
TUBE CONNECTING 12X1/4 (SUCTIONS) ×2 IMPLANT
WATER STERILE IRR 1000ML POUR (IV SOLUTION) ×2 IMPLANT
YANKAUER SUCT BULB TIP NO VENT (SUCTIONS) ×2 IMPLANT

## 2021-07-13 NOTE — Progress Notes (Signed)
RT placed patient on CPAP HS auto. No O2 bleed in needed. RT also placed patient on overnight pulse ox. Patient tolerating well at this time.

## 2021-07-13 NOTE — Consult Note (Addendum)
Medical consult We will continue to follow and make adjustments to meds  Catherine Espinoza CVE:[id number] DOB: 12-Oct-1975 DOA: 07/13/2021  PCP: [name], NP   Reason for consult: Medical comanagement Chief Complaint: Hand abscess  HPI:  45 year old black female Dm tyii, htn, CAD with 2 vessel disease s/p LAD stent 10/13/2020 Ishc/non ischemic cardiomyopathy prior EF-20% Uncontrolled hyperlipidemia OSA sleep apnea using CPAP [previously used has not used in years because of nonfunctioning machine] BMI >40 morbid obesity History of hidradenitis suppurativa Chronic daily smoker half pack per day for many years  Admit electively Dr. Tempie Donning hand surgery after several weeks patient having hand discomfort-was treated in the outpatient setting with doxycycline for about 2 weeks and although swelling came down patient required intervention and underwent complex I&D of dorsal and volar abscess of the right hand Intraoperative cultures were taken  Endorses no complaints other than mild discomfort in hand Review of Systems:    Pertinent +'s: Mild hand pain Pertinent -"s: Fever chills nausea vomiting cough cold sputum blurred vision double vision unilateral weakness seizure dark stool tarry stool nausea vomiting constipation rash lower extremity swelling unilateral weakness fall congestion     Past Medical History:  Diagnosis Date   Anemia, unspecified    CAD (coronary artery disease)    Chronic left-sided thoracic back pain    Excessive or frequent menstruation    Morbid obesity ([place])    Obstructive sleep apnea 02/11/2011   Sleep study: severe OSA- rec CPAP 20cm small  full face mask   Proteinuria    Thoracic radiculopathy    Type II or unspecified type diabetes mellitus without mention of complication, not stated as uncontrolled    Unspecified essential hypertension    Past Surgical History:  Procedure Laterality Date   ABDOMINAL HYSTERECTOMY     ABLATION COLPOCLESIS      CESAREAN SECTION     CLEFT PALATE REPAIR     CORONARY STENT INTERVENTION N/A 10/13/2020   Procedure: CORONARY STENT INTERVENTION;  Surgeon: Burnell Blanks, MD;  Location: [facility];  Service: Cardiovascular;  Laterality: N/A;   cyst removal     ECTOPIC PREGNANCY SURGERY     x 2   OOPHORECTOMY Right 2002   RIGHT/LEFT HEART CATH AND CORONARY ANGIOGRAPHY N/A 10/13/2020   Procedure: RIGHT/LEFT HEART CATH AND CORONARY ANGIOGRAPHY;  Surgeon: Jolaine Artist, MD;  Location: [facility];  Service: Cardiovascular;  Laterality: N/A;    reports that she has been smoking cigarettes. She has a 5.00 pack-year smoking history. She has never used smokeless tobacco. She reports current alcohol use. She reports that she does not use drugs.  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 Authorizing Provider  acetaminophen (TYLENOL) 500 MG tablet Take 1,000 mg by mouth every 6 (six) hours as needed for mild pain or moderate pain.   Yes [provider]  aspirin 81 MG  chewable tablet CHEW 1 TABLET (81 MG TOTAL) BY MOUTH DAILY. 10/16/20 10/16/21 Yes Rai, Ripudeep K, MD  carvedilol (COREG) 12.5 MG tablet Take 1 & 1/2 tablets (18.75 mg total) by mouth 2 (two) times daily with a meal. 03/12/21 03/12/22 Yes Bensimhon, Shaune Pascal, MD  clopidogrel (PLAVIX) 75 MG tablet TAKE 1 TABLET (75 MG TOTAL) BY MOUTH DAILY. 03/06/21 03/06/22 Yes Milford, Maricela Bo, FNP  dapagliflozin propanediol (FARXIGA) 10 MG TABS tablet TAKE 1 TABLET (10 MG  TOTAL) BY MOUTH DAILY. 03/06/21 03/06/22 Yes Milford, Maricela Bo, FNP  doxycycline (VIBRA-TABS) 100 MG tablet Take 1 tablet (100 mg total) by mouth 2 (two) times daily. 07/11/21  Yes [name], NP  furosemide (LASIX) 40 MG tablet TAKE 1 TABLET (40 MG TOTAL) BY MOUTH DAILY. FOR EVERY 3 LB WEIGHT GAIN 12/07/20 12/07/21 Yes Milford, Jessica M, FNP  insulin degludec (TRESIBA) 100 UNIT/ML FlexTouch Pen INJECT UP TO 50 UNITS UNDER THE SKIN DAILY Patient taking differently: Inject 40 Units into the skin daily. 12/19/20 12/19/21 Yes [name], NP  rosuvastatin (CRESTOR) 40 MG tablet TAKE 1 TABLET (40 MG TOTAL) BY MOUTH DAILY. 03/06/21 03/06/22 Yes Milford, Maricela Bo, FNP  sacubitril-valsartan (ENTRESTO) 97-103 MG Take 1 tablet by mouth 2 (two) times daily. 03/30/21 03/30/22 Yes Milford, Maricela Bo, FNP  spironolactone (ALDACTONE) 25 MG tablet TAKE 1 TABLET (25 MG TOTAL) BY MOUTH DAILY. 03/06/21 03/06/22 Yes Milford, Maricela Bo, FNP  BAYER MICROLET LANCETS lancets Use as instructed to check blood sugar three times daily.  DX  E11.65 Patient taking differently: Use as instructed to check blood sugar three times daily.  DX  E11.65 10/24/17   [name], NP  blood glucose meter kit and supplies KIT Dispense based on patient and insurance preference. Use up to four times daily as directed. (FOR ICD-9 250.00, 250.01). 10/03/20   [name], NP  Blood Glucose Monitoring Suppl (FREESTYLE LITE) w/Device KIT USE UP TO 4 TIMES DAILY 10/03/20 10/03/21  [name], NP  Continuous Blood Gluc Receiver (FREESTYLE LIBRE 2 READER) DEVI USE AS DIRECTED 10/10/20 10/10/21  [name], NP  Continuous Blood Gluc Sensor (FREESTYLE LIBRE 2 SENSOR) MISC USE AS DIRECTED 07/04/21   [name], NP  Continuous Blood Gluc Sensor (FREESTYLE LIBRE SENSOR SYSTEM) MISC APPLY SENSOR EVERY 14 DAYS 10/03/20 10/03/21  [name], NP  fluconazole (DIFLUCAN) 150 MG tablet Take 1 tab by mouth today, then repeat in  3 days if needed. Patient not taking: Reported on 07/12/2021 07/11/21   [name], NP  glucose blood test strip USE UP TO 4 TIMES DAILY 10/03/20 10/03/21  [name], NP  Insulin Pen Needle 32G X 4 MM MISC USE AS DIRECTED WITH INSULIN 10/16/20 10/16/21  Rai, Vernelle Emerald, MD  Insulin Pen Needle 32G X 4 MM MISC USE AS DIRECTED ONCE DAILY 10/03/20 10/03/21  [name], NP  Insulin Syringe 27G X 1/2" 0.5 ML MISC Use as directed 03/16/20   [name], NP  Lancets (FREESTYLE) lancets USE UP TO FOUR TIMES DAILY AS DIRECTED 10/03/20 10/03/21  [name], NP  potassium chloride SA (KLOR-CON) 20 MEQ tablet TAKE 1 TABLET (20 MEQ TOTAL) BY MOUTH DAILY. Patient not taking: Reported on 07/12/2021 12/07/20 12/07/21  Rafael Bihari, FNP  traMADol (ULTRAM) 50 MG tablet Take 1 tablet (50 mg total) by mouth every 6 (six) hours as needed for up to 5 days. Patient not taking: Reported on 07/12/2021 07/11/21 07/16/21  [name], NP  atorvastatin (LIPITOR) 40 MG tablet Take  1 tablet (40 mg total) by mouth daily. 10/03/20 10/16/20  [name], NP  Insulin Glargine (BASAGLAR KWIKPEN) 100 UNIT/ML Inject 65 Units into the skin daily. Patient not taking: Reported on 10/12/2020 10/04/20 10/16/20  [name], NP  lisinopril (ZESTRIL) 20 MG tablet Take 1 tablet (20 mg total) by mouth daily. 10/03/20 10/16/20  [name], NP    Physical Exam:  Vitals:   07/13/21 1645 07/13/21 1700  BP: (!) 170/82 (!) 172/92  Pulse: 82 84  Resp: 17 20  Temp:    SpO2: 95% 93%    Obese pleasant coherent black female no distress thick neck Mallampati 4 No icterus no pallor Cannot appreciate thyroid no submandibular lymphadenopathy S1-S2 no murmur regular rate rhythm Chest clear no added sound no rales no rhonchi no adventitious sounds or wheeze Obese nontender nondistended no rebound no guarding ROM grossly intact to major joints Neurologically intact 5/5 power extraocular  movements intact no Babinski sign, knee ankle test is negative   I have personally reviewed following labs and imaging studies  Labs:  No labs performed  Imaging studies:  None  Medical tests:  EKG independently reviewed: None performed  Test discussed with performing physician: Yes discussed with admitting physician Dr. Tempie Donning hand surgeon  Decision to obtain old records:  Yes  Review and summation of old records:  Yes I have reviewed  Active Problems:   Hand abscess   Assessment/Plan Hand abscess Defer planning to hand surgeon regarding postop care, pain control, antibiotics pain control oxycodone 5-10 every 4 as needed Looks like hand soaks and diluted Hibiclens is wound care Have discontinued Doxy and fluconazole from prior to admission Follow cultures for guiding therapy-no current antibiotics HFrEF in January-->HF improved EF 40-45% 03/12/2021 with grade 1 diastolic dysfunction Resuming Coreg 18.75, Entresto twice daily, Aldactone 25 daily, Lasix 40 daily-appears euvolemic Also resume Farxiga 10 mg daily Baseline weight seems to be about 110 kg so we will adjust lasix accordingly DM TY 2 Glycemic control is actually improved from prior hospitalization at 5 and is around 10 Patient was in the process of being referred to endocrinology I will start sliding scale in addition to 50 units of Tresiba continue Farxiga I suspect if she has a normal GFR we will add metformin 500 twice daily which can help with weight salutatory benefits CAD with stent 09/2020 Continue above meds and Plavix Still smoker Will try Zyban 150 x 3 d and then-patient has had bad dreams with Chantix-I will combine this with nicotine patch and see how she does OSA not on CPAP Try auto titration mask at night and see how she does with that May be able to qualify for nighttime desaturations so we will order the same   Severity of Illness: The appropriate patient status for this patient is  INPATIENT. Inpatient status is judged to be reasonable and necessary in order to provide the required intensity of service to ensure the patient's safety. The patient's presenting symptoms, physical exam findings, and initial radiographic and laboratory data in the context of their chronic comorbidities is felt to place them at high risk for further clinical deterioration. Furthermore, it is not anticipated that the patient will be medically stable for discharge from the hospital within 2 midnights of admission.   * I certify that at the point of admission it is my clinical judgment that the patient will require inpatient hospital care spanning beyond 2 midnights from the point of admission due to high intensity of service, high  risk for further deterioration and high frequency of surveillance required.*   DVT prophylaxis: Lovenox Code Status: Confirmed full Family Communication: None at bedside Consults called: No  Time spent: 72 minutes   Verlon Au, MD [days-call my NP partners at night for Care related issues] [facility] --[facility] [employer] [facility] , [email or web address]; password [facility]  07/13/2021, 5:04 PM

## 2021-07-13 NOTE — Progress Notes (Signed)
Pt's CBG is 497. [facility] [employer] is notified.

## 2021-07-13 NOTE — Transfer of Care (Signed)
Immediate Anesthesia Transfer of Care Note  Patient: Catherine Espinoza  Procedure(s) Performed: IRRIGATION AND DEBRIDEMENT OF HAND (Right)  Patient Location: PACU  Anesthesia Type:MAC  Level of Consciousness: awake  Airway & Oxygen Therapy: Patient Spontanous Breathing  Post-op Assessment: Report given to RN and Post -op Vital signs reviewed and stable  Post vital signs: stable  Last Vitals:  Vitals Value Taken Time  BP 149/88 07/13/21 1630  Temp 36.3 C 07/13/21 1630  Pulse 85 07/13/21 1632  Resp 19 07/13/21 1632  SpO2 94 % 07/13/21 1632  Vitals shown include unvalidated device data.  Last Pain:  Vitals:   07/13/21 1525  TempSrc:   PainSc: 0-No pain         Complications: No notable events documented.

## 2021-07-13 NOTE — Anesthesia Postprocedure Evaluation (Signed)
Anesthesia Post Note  Patient: Catherine Espinoza  Procedure(s) Performed: IRRIGATION AND DEBRIDEMENT OF HAND (Right: Hand)     Patient location during evaluation: PACU Anesthesia Type: Regional and MAC Level of consciousness: awake and alert Pain management: pain level controlled Vital Signs Assessment: post-procedure vital signs reviewed and stable Respiratory status: spontaneous breathing, nonlabored ventilation and respiratory function stable Cardiovascular status: blood pressure returned to baseline and stable Postop Assessment: no apparent nausea or vomiting Anesthetic complications: no   No notable events documented.  Last Vitals:  Vitals:   07/13/21 1630 07/13/21 1645  BP: (!) 149/88 (!) 170/82  Pulse: 85 82  Resp: 16 17  Temp: (!) 36.3 C   SpO2: 94% 95%    Last Pain:  Vitals:   07/13/21 1630  TempSrc:   PainSc: 0-No pain                 Pervis Hocking

## 2021-07-13 NOTE — Op Note (Signed)
   Date of Surgery: 07/13/2021  INDICATIONS: Catherine Espinoza is a 45 y.o.-year-old female with a right collar button abscess involving the fourth webspace.  Risks, benefits, and alternatives to surgery were discussed.  The patient and Patient did consent to the procedure after extensive discussion.   PREOPERATIVE DIAGNOSIS: Right hand abscess  POSTOPERATIVE DIAGNOSIS: Same.  PROCEDURE:  I&D of complex R hand abscess involving volar and dorsal hand (82518)   SURGEON: Audria Nine, M.D.  ASSIST:   ANESTHESIA:  Regional  IV FLUIDS AND URINE: See anesthesia.  ESTIMATED BLOOD LOSS: 10 mL.  IMPLANTS: * No implants in log *   DRAINS: None   COMPLICATIONS: see description of procedure.  DESCRIPTION OF PROCEDURE: The patient was met in the preoperative holding area where the surgical site was marked and the consent form was verified.  The patient was then taken to the operating room and transferred to the operating table.  All bony prominences were well padded.  A tourniquet was applied to the right forearm.  The operative extremity was prepped and draped in the usual and sterile fashion.  A formal time-out was performed to confirm that this was the correct patient, surgery, side, and site.   Following timeout, the limb was exsanguinated by gravity.  A longitudinal incision was made just proximal to the fourth webspace on both the dorsal and volar aspects of the hand.  I made the volar incision first and immediately encountered gross purulence.  The dorsal incision was then made.  I used a hemostat to bluntly spread between both incisions and to break up any potential loculations.  Multiple culture swabs were taken.  The wound was thoroughly irrigated with copious sterile saline.  The tourniquet was let down and hemostasis achieved with direct pressure.  The wound was packed with sterile packing.  The wound was dressed with xeroform, 4x4, kerlix, and an ace wrap.  She was then reversed from  anesthesia and transferred to the postoperative bed.  All counts were correct x 2 at the end of the procedure.  She was taken to the PACU in stable condition.   POSTOPERATIVE PLAN: She will be admitted postoperatively.  She has poorly controlled DM with her last A1c being >10.  Will follow up intraop cultures and likely consult ID for abx recommendations. We will start BID wound care tomorrow morning with hand soaks in diluted Hibiclens solution.   Audria Nine, MD 4:50 PM

## 2021-07-13 NOTE — Brief Op Note (Signed)
07/13/2021  4:25 PM  PATIENT:  Catherine Espinoza  45 y.o. female  PRE-OPERATIVE DIAGNOSIS:  PAIN AND ABSCESS RIGHT HAND  POST-OPERATIVE DIAGNOSIS: Right hand abscess  PROCEDURE:  Procedure(s): IRRIGATION AND DEBRIDEMENT OF HAND (Right)  SURGEON:  Surgeon(s) and Role:    * Sherilyn Cooter, MD - Primary  PHYSICIAN ASSISTANT:   ASSISTANTS: none   ANESTHESIA:   regional  EBL:  10 cc   BLOOD ADMINISTERED:none  DRAINS: none   LOCAL MEDICATIONS USED:  NONE  SPECIMEN:  Swabs x 4  DISPOSITION OF SPECIMEN:   Micro  COUNTS:  YES  TOURNIQUET:  * No tourniquets in log *  DICTATION: .Dragon Dictation  PLAN OF CARE: Admit for overnight observation  PATIENT DISPOSITION:  PACU - hemodynamically stable.   Delay start of Pharmacological VTE agent (>24hrs) due to surgical blood loss or risk of bleeding: not applicable

## 2021-07-13 NOTE — Anesthesia Preprocedure Evaluation (Addendum)
Anesthesia Evaluation  Patient identified by MRN, date of birth, ID band Patient awake    Reviewed: Allergy & Precautions, NPO status , Patient's Chart, lab work & pertinent test results, reviewed documented beta blocker date and time   Airway Mallampati: III  TM Distance: >3 FB Neck ROM: Full    Dental  (+) Teeth Intact, Dental Advisory Given   Pulmonary sleep apnea (non-compliant w/ CPAP, in the process of getting new machine) , Current Smoker,  Current smoker, 1/2 ppd    Pulmonary exam normal breath sounds clear to auscultation       Cardiovascular hypertension, Pt. on home beta blockers and Pt. on medications + CAD, + Cardiac Stents (DES to LAD 09/2020, plavix LD yesterday) and +CHF (LVEF 70-48%, grade 1 diastolic dysfunction)  Normal cardiovascular exam Rhythm:Regular Rate:Normal  Echo 03/12/21: 1. Left ventricular ejection fraction, by estimation, is 40 to 45%. The  left ventricle has mildly decreased function. The left ventricle  demonstrates global hypokinesis. There is mild concentric left ventricular  hypertrophy. Left ventricular diastolic  parameters are consistent with Grade I diastolic dysfunction (impaired  relaxation). Elevated left ventricular end-diastolic pressure.  2. Right ventricular systolic function is normal. The right ventricular  size is normal. There is normal pulmonary artery systolic pressure.  3. The pericardial effusion is circumferential.  4. The mitral valve is normal in structure. Trivial mitral valve  regurgitation. No evidence of mitral stenosis.  5. The aortic valve is normal in structure. Aortic valve regurgitation is  trivial. No aortic stenosis is present.  6. The inferior vena cava is normal in size with greater than 50%  respiratory variability, suggesting right atrial pressure of 3 mmHg.  7. Compared to prior echo, LVF has significantly improved.    Cath for stent 09/2020: ? Mid  LAD-1 lesion is 40% stenosed. ? Mid LAD-2 lesion is 80% stenosed. ? A drug-eluting stent was successfully placed using a SYNERGY XD [id number]. ? Post intervention, there is a 0% residual stenosis. ? Post intervention, there is a 0% residual stenosis. 1. Severe mid LAD stenosis 2. Successful PTCA/DES x 1 mid LAD.  Recommendations: Continue DAPT with ASA and Plavix for at least six months.     Neuro/Psych negative neurological ROS  negative psych ROS   GI/Hepatic Neg liver ROS, GERD  Controlled,  Endo/Other  diabetes, Poorly Controlled, Type 2, Insulin Dependent, Oral Hypoglycemic AgentsMorbid obesityBMI 42 a1c 10.7 FS 160  Renal/GU negative Renal ROS  negative genitourinary   Musculoskeletal Pain ,abscess R hand   Abdominal (+) + obese,   Peds  Hematology negative hematology ROS (+)   Anesthesia Other Findings   Reproductive/Obstetrics negative OB ROS                            Anesthesia Physical Anesthesia Plan  ASA: 3  Anesthesia Plan: MAC and Regional   Post-op Pain Management:  Regional for Post-op pain   Induction:   PONV Risk Score and Plan: 2 and Propofol infusion and TIVA  Airway Management Planned: Natural Airway and Simple Face Mask  Additional Equipment: None  Intra-op Plan:   Post-operative Plan:   Informed Consent: I have reviewed the patients History and Physical, chart, labs and discussed the procedure including the risks, benefits and alternatives for the proposed anesthesia with the patient or authorized representative who has indicated his/her understanding and acceptance.     Dental advisory given  Plan Discussed with: CRNA  Anesthesia Plan  Comments:        Anesthesia Quick Evaluation

## 2021-07-13 NOTE — Anesthesia Procedure Notes (Signed)
Anesthesia Regional Block: Supraclavicular block   Pre-Anesthetic Checklist: , timeout performed,  Correct Patient, Correct Site, Correct Laterality,  Correct Procedure, Correct Position, site marked,  Risks and benefits discussed,  Surgical consent,  Pre-op evaluation,  At surgeon's request and post-op pain management  Laterality: Right  Prep: Maximum Sterile Barrier Precautions used, chloraprep       Needles:  Injection technique: Single-shot  Needle Type: Echogenic Stimulator Needle     Needle Length: 9cm  Needle Gauge: 22     Additional Needles:   Procedures:,,,, ultrasound used (permanent image in chart),,    Narrative:  Start time: 07/13/2021 3:20 PM End time: 07/13/2021 3:25 PM Injection made incrementally with aspirations every 5 mL.  Performed by: Personally  Anesthesiologist: Pervis Hocking, DO  Additional Notes: Monitors applied. No increased pain on injection. No increased resistance to injection. Injection made in 5cc increments. Good needle visualization. Patient tolerated procedure well.

## 2021-07-13 NOTE — Interval H&P Note (Signed)
History and Physical Interval Note:  07/13/2021 3:18 PM  Catherine Espinoza  has presented today for surgery, with the diagnosis of PAIN AND ABSCESS RIGHT HAND.  The various methods of treatment have been discussed with the patient and family. After consideration of risks, benefits and other options for treatment, the patient has consented to  Procedure(s): IRRIGATION AND DEBRIDEMENT OF HAND (Right) as a surgical intervention.  The patient's history has been reviewed, patient examined, no change in status, stable for surgery.  I have reviewed the patient's chart and labs.  Questions were answered to the patient's satisfaction.      [name]

## 2021-07-14 ENCOUNTER — Encounter (HOSPITAL_COMMUNITY): Payer: Self-pay | Admitting: Orthopedic Surgery

## 2021-07-14 DIAGNOSIS — L02531 Carbuncle of right hand: Secondary | ICD-10-CM | POA: Diagnosis not present

## 2021-07-14 DIAGNOSIS — L02519 Cutaneous abscess of unspecified hand: Secondary | ICD-10-CM | POA: Diagnosis not present

## 2021-07-14 LAB — COMPREHENSIVE METABOLIC PANEL
ALT: 21 U/L (ref 0–44)
AST: 14 U/L — ABNORMAL LOW (ref 15–41)
Albumin: 2.6 g/dL — ABNORMAL LOW (ref 3.5–5.0)
Alkaline Phosphatase: 81 U/L (ref 38–126)
Anion gap: 8 (ref 5–15)
BUN: 26 mg/dL — ABNORMAL HIGH (ref 6–20)
CO2: 22 mmol/L (ref 22–32)
Calcium: 8.7 mg/dL — ABNORMAL LOW (ref 8.9–10.3)
Chloride: 99 mmol/L (ref 98–111)
Creatinine, Ser: 1 mg/dL (ref 0.44–1.00)
GFR, Estimated: >60 mL/min (ref >60)
Glucose, Bld: 409 mg/dL — ABNORMAL HIGH (ref 70–99)
Potassium: 3.9 mmol/L (ref 3.5–5.1)
Sodium: 129 mmol/L — ABNORMAL LOW (ref 135–145)
Total Bilirubin: 0.5 mg/dL (ref 0.3–1.2)
Total Protein: 6.4 g/dL — ABNORMAL LOW (ref 6.5–8.1)

## 2021-07-14 LAB — CBC WITH DIFFERENTIAL/PLATELET
Abs Immature Granulocytes: 0.02 10*3/uL (ref 0.00–0.07)
Basophils Absolute: 0 10*3/uL (ref 0.0–0.1)
Basophils Relative: 0 %
Eosinophils Absolute: 0 10*3/uL (ref 0.0–0.5)
Eosinophils Relative: 0 %
HCT: 44.4 % (ref 36.0–46.0)
Hemoglobin: 14.7 g/dL (ref 12.0–15.0)
Immature Granulocytes: 0 %
Lymphocytes Relative: 10 %
Lymphs Abs: 0.7 10*3/uL (ref 0.7–4.0)
MCH: 29.6 pg (ref 26.0–34.0)
MCHC: 33.1 g/dL (ref 30.0–36.0)
MCV: 89.5 fL (ref 80.0–100.0)
Monocytes Absolute: 0.1 10*3/uL (ref 0.1–1.0)
Monocytes Relative: 1 %
Neutro Abs: 6 10*3/uL (ref 1.7–7.7)
Neutrophils Relative %: 89 %
Platelets: 250 10*3/uL (ref 150–400)
RBC: 4.96 MIL/uL (ref 3.87–5.11)
RDW: 13 % (ref 11.5–15.5)
WBC: 6.7 10*3/uL (ref 4.0–10.5)
nRBC: 0 % (ref 0.0–0.2)

## 2021-07-14 LAB — GLUCOSE, CAPILLARY
Glucose-Capillary: 394 mg/dL — ABNORMAL HIGH (ref 70–99)
Glucose-Capillary: 253 mg/dL — ABNORMAL HIGH (ref 70–99)
Glucose-Capillary: 159 mg/dL — ABNORMAL HIGH (ref 70–99)

## 2021-07-14 MED ORDER — INSULIN ASPART 100 UNIT/ML IJ SOLN: 6.0000 [IU] | Freq: Three times a day (TID) | INTRAMUSCULAR | Status: DC

## 2021-07-14 MED ORDER — OXYCODONE HCL 5 MG PO TABS: 5.0000 mg | ORAL_TABLET | Freq: Four times a day (QID) | ORAL | 0 refills | Status: AC | PRN

## 2021-07-14 MED ORDER — CHLORHEXIDINE GLUCONATE 4 % EX LIQD
CUTANEOUS | Status: AC
  Filled 2021-07-14: qty 30

## 2021-07-14 MED ORDER — DOXYCYCLINE HYCLATE 100 MG PO TABS
100.0000 mg | ORAL_TABLET | Freq: Two times a day (BID) | ORAL | Status: DC
  Filled 2021-07-14: qty 1

## 2021-07-14 MED ORDER — GLIMEPIRIDE 1 MG PO TABS
1.0000 mg | ORAL_TABLET | Freq: Every day | ORAL | Status: DC
  Filled 2021-07-14: qty 1

## 2021-07-14 MED ORDER — DOXYCYCLINE HYCLATE 50 MG PO CAPS: 50.0000 mg | ORAL_CAPSULE | Freq: Two times a day (BID) | ORAL | 0 refills | Status: AC

## 2021-07-14 NOTE — Discharge Instructions (Signed)
Audria Nine, M.D. Hand Surgery  POST-OPERATIVE DISCHARGE INSTRUCTIONS   PRESCRIPTIONS: You have been given a prescription to be taken as directed for post-operative pain control.  You may also take over the counter ibuprofen/aleve and tylenol for pain. Take this as directed on the packaging. Do not exceed 3000 mg tylenol/acetaminophen in 24 hours.  Ibuprofen 600-800 mg (3-4) tablets by mouth every 6 hours as needed for pain.  OR Aleve 2 tablets by mouth every 12 hours (twice daily) as needed for pain.  AND/OR Tylenol 1000 mg (2 tablets) every 8 hours as needed for pain.  Please use your pain medication carefully, as refills are limited and you may not be provided with one.  As stated above, please use over the counter pain medicine - it will also be helpful with decreasing your swelling.    ANESTHESIA: After your surgery, post-surgical discomfort or pain is likely. This discomfort can last several days to a few weeks. At certain times of the day your discomfort may be more intense.   Did you receive a nerve block?  A nerve block can provide pain relief for one hour to two days after your surgery. As long as the nerve block is working, you will experience little or no sensation in the area the surgeon operated on.  As the nerve block wears off, you will begin to experience pain or discomfort. It is very important that you begin taking your prescribed pain medication before the nerve block fully wears off. Treating your pain at the first sign of the block wearing off will ensure your pain is better controlled and more tolerable when full-sensation returns. Do not wait until the pain is intolerable, as the medicine will be less effective. It is better to treat pain in advance than to try and catch up.   General Anesthesia:  If you did not receive a nerve block during your surgery, you will need to start taking your pain medication shortly after your surgery and should continue to  do so as prescribed by your surgeon.     ICE AND ELEVATION: Motion of your fingers is very important s to decrease the swelling. This should be done at least 10 repetitions 3-4 times a day.  Elevation, as much as possible for the next 48 hours, is critical for decreasing swelling as well as for pain relief. Elevation means when you are seated or lying down, you hand should be at or above your heart. When walking, the hand needs to be at or above the level of your elbow.  If the bandage gets too tight, it may need to be loosened. Please contact our office and we will instruct you in how to do this.    WOUND CARE:  Remove dressing and perform wound care twice per day.  Soak hand in warm water with 3-4 squirts of Hibiclens solution for 5-10 minutes.  Pat dry.  Apply nonadherent gauze, 4x4, and ace wrap.      HAND THERAPY:  You may not need any. If you do, we will begin this at your follow up visit in the clinic.    ACTIVITY AND WORK: You are encouraged to move any fingers which are not in the bandage. Attached is an instruction sheet on finger motion. Do this as much as you need to make your fingers move fully and keep the swelling down.  Light use of the fingers is allowed to assist the other hand with daily hygiene and eating,  but strong gripping or lifting is often uncomfortable and should be avoided.  You might miss a variable period of time from work and hopefully this issue has been discussed prior to surgery. You may not do any heavy work with your affected hand for about 2 weeks.    [facility] [address] [place],  [place]  [address] [phone]

## 2021-07-14 NOTE — Progress Notes (Signed)
Assesment/PLAN  P  Hand abscess Defer planning to hand surgeon Gram-positive cocci--we will restart doxycycline and monitor wound as per surgeon She has indicated she does not want to stay in the hospital HFrEF in January-->HF improved EF 40-45% 03/12/2021 with grade 1 diastolic dysfunction Resuming Coreg 18.75, Entresto twice daily, Aldactone 25 daily, Lasix 40 daily-appears euvolemic Also resume Farxiga 10 mg daily DM TY 2 CBGs in the 300 range Increase mealtime insulin from 3 to 6 units continue 50 units of Tresiba [did not receive full dose yesterday presurgery] continue [place] Had side effects with metformin and does not want to use We discussed use of Amaryl midday and she is okay with this CAD with stent 09/2020 Continue above meds and Plavix Still smoker Will try Zyban 150 x 3 d titrated upwards and nicotine patch OSA not on CPAP Try auto titration mask at night and see how she does with that Able to sleep with CPAP machine DME CPAP ordered   DVT-, Family   Dispo: Inpatient pending cultures  S -  45 year old black female Dm tyii, htn, CAD with 2 vessel disease s/p LAD stent 10/13/2020 Ishc/non ischemic cardiomyopathy prior EF-20% Uncontrolled hyperlipidemia OSA sleep apnea using CPAP [previously used has not used in years because of nonfunctioning machine] BMI >40 morbid obesity History of hidradenitis suppurativa Chronic daily smoker half pack per day for many years   Admit electively Dr. Tempie Donning hand surgery after several weeks patient having hand discomfort-was treated in the outpatient setting with doxycycline for about 2 weeks and although swelling came down patient required intervention and underwent complex I&D of dorsal and volar abscess of the right hand Intraoperative cultures were taken   TODAY  O BP (!) 160/85 (BP Location: Left Arm)   Pulse 80   Temp 98.6 F (37 C) (Oral)   Resp 17   Ht 5\' 5"  (1.651 m)   Wt 113.4 kg   LMP 09/13/2011   SpO2  91%   BMI 41.60 kg/m   Awake coherent S1-S2 no murmur And admitted for stress Abdomen no rebound no guarding ROM intact no focal deficit Chest clear no added sound   Labs/studies reviewed today  Sodium 129 BUNs/creatinine 26/1.0  Hemoglobin 14.7 WBC 6.7 platelet 250

## 2021-07-14 NOTE — Discharge Summary (Signed)
Physician Discharge Summary  Patient ID: Catherine Espinoza MRN: [id number] DOB/AGE: 06-01-1976 45 y.o.  Admit date: 07/13/2021 Discharge date: 07/14/2021  Admission Diagnoses:  Discharge Diagnoses:  Active Problems:   Hand abscess   Discharged Condition: good  Hospital Course: Admitted from surgery with right hand abscess.  Underwent successful I&D.  Stayed overnight for observation.  Intraop cx growing GPCs.  Hospitalist consulted for multiple medical problems including poorly controlled DM2.  Discharged to home on appropriate PO abx.  Discharge instructions given to patient.   Consults:  Hospitalist  Significant Diagnostic Studies: microbiology: wound culture: positive for GPCs  Treatments: antibiotics: Ancef  Discharge Exam: Blood pressure (!) 160/85, pulse 80, temperature 98.6 F (37 C), temperature source Oral, resp. rate 17, height _0  (1.651 m), weight 113.4 kg, last menstrual period 09/13/2011, SpO2 91 %. General appearance: alert Pulm: Normal WOB on RA CV: Extremities warm and well perfused R hand: Wound clean and dry, no drainage or erythema  Disposition: Home   Allergies as of 07/14/2021       Reactions   Ibuprofen    REACTION: knots in mouth with SOB   Labetalol Nausea Only   Aspirin Other (See Comments)   Knots in mouth/ Higher doses        Medication List     STOP taking these medications    doxycycline 100 MG tablet Commonly known as: VIBRA-TABS Replaced by: doxycycline 50 MG capsule       TAKE these medications    acetaminophen 500 MG tablet Commonly known as: TYLENOL Take 1,000 mg by mouth every 6 (six) hours as needed for mild pain or moderate pain.   aspirin 81 MG chewable tablet CHEW 1 TABLET (81 MG TOTAL) BY MOUTH DAILY.   Bayer Microlet Lancets lancets Use as instructed to check blood sugar three times daily.  DX  E11.65   freestyle lancets USE UP TO FOUR TIMES DAILY AS DIRECTED   blood glucose meter kit and supplies  Kit Dispense based on patient and insurance preference. Use up to four times daily as directed. (FOR ICD-9 250.00, 250.01).   carvedilol 12.5 MG tablet Commonly known as: COREG Take 1 & 1/2 tablets (18.75 mg total) by mouth 2 (two) times daily with a meal.   clopidogrel 75 MG tablet Commonly known as: PLAVIX TAKE 1 TABLET (75 MG TOTAL) BY MOUTH DAILY.   doxycycline 50 MG capsule Commonly known as: VIBRAMYCIN Take 1 capsule (50 mg total) by mouth 2 (two) times daily for 7 days. Replaces: doxycycline 100 MG tablet   Entresto 97-103 MG Generic drug: sacubitril-valsartan Take 1 tablet by mouth 2 (two) times daily.   Farxiga 10 MG Tabs tablet Generic drug: dapagliflozin propanediol TAKE 1 TABLET (10 MG TOTAL) BY MOUTH DAILY.   fluconazole 150 MG tablet Commonly known as: Diflucan Take 1 tab by mouth today, then repeat in 3 days if needed.   FreeStyle Libre 2 Reader Kerrin Mo USE AS DIRECTED   FreeStyle Libre Sensor System Misc APPLY SENSOR EVERY 14 DAYS   FreeStyle Libre 2 Sensor Misc USE AS DIRECTED   FREESTYLE LITE test strip Generic drug: glucose blood USE UP TO 4 TIMES DAILY   FreeStyle Lite w/Device Kit USE UP TO 4 TIMES DAILY   furosemide 40 MG tablet Commonly known as: LASIX TAKE 1 TABLET (40 MG TOTAL) BY MOUTH DAILY. FOR EVERY 3 LB WEIGHT GAIN   Insulin Syringe 27G X 1/2" 0.5 ML Misc Use as directed   oxyCODONE 5 MG immediate  release tablet Commonly known as: Roxicodone Take 1 tablet (5 mg total) by mouth every 6 (six) hours as needed for up to 5 days for severe pain.   BD Pen Needle Nano U/F 32G X 4 MM Misc Generic drug: Insulin Pen Needle USE AS DIRECTED ONCE DAILY   PenTips 32G X 4 MM Misc Generic drug: Insulin Pen Needle USE AS DIRECTED WITH INSULIN   potassium chloride SA 20 MEQ tablet Commonly known as: KLOR-CON TAKE 1 TABLET (20 MEQ TOTAL) BY MOUTH DAILY.   rosuvastatin 40 MG tablet Commonly known as: CRESTOR TAKE 1 TABLET (40 MG TOTAL) BY  MOUTH DAILY.   spironolactone 25 MG tablet Commonly known as: ALDACTONE TAKE 1 TABLET (25 MG TOTAL) BY MOUTH DAILY.   traMADol 50 MG tablet Commonly known as: ULTRAM Take 1 tablet (50 mg total) by mouth every 6 (six) hours as needed for up to 5 days.   Tyler Aas FlexTouch 100 UNIT/ML FlexTouch Pen Generic drug: insulin degludec INJECT UP TO 50 UNITS UNDER THE SKIN DAILY What changed:  how much to take how to take this when to take this               Durable Medical Equipment  (From admission, onward)           Start     Ordered   07/14/21 0958  For home use only DME continuous positive airway pressure (CPAP)  Once       Question Answer Comment  Length of Need Lifetime   Patient has OSA or probable OSA Yes   Is the patient currently using CPAP in the home No   Settings Autotitration   Signs and symptoms of probable OSA  (select all that apply) Snoring   Signs and symptoms of probable OSA  (select all that apply) Gasping during sleep   CPAP supplies needed Mask, headgear, cushions, filters, heated tubing and water chamber      07/14/21 0957             Signed: Sherilyn Cooter 07/14/2021, 10:56 AM

## 2021-07-14 NOTE — Plan of Care (Signed)
  Problem: Education: Goal: Knowledge of General Education information will improve Description: Including pain rating scale, medication(s)/side effects and non-pharmacologic comfort measures Outcome: Progressing   Problem: Activity: Goal: Risk for activity intolerance will decrease Outcome: Progressing   Problem: Nutrition: Goal: Adequate nutrition will be maintained Outcome: Progressing   

## 2021-07-14 NOTE — Progress Notes (Signed)
RT NOTE:  Overnight pulse-ox removed. Report generated, printed and sent to [facility]N to be put in patients chart.

## 2021-07-14 NOTE — Progress Notes (Signed)
CBG 397. [facility] is notified.

## 2021-07-14 NOTE — Progress Notes (Signed)
Per Jeannette Corpus to give 2 units of Novolog and recheck in an hour.

## 2021-07-14 NOTE — Progress Notes (Signed)
   Subjective:  No acute events overnight. Resting comfortably.  Peripheral nerve block still working so no pain or sensation.   Objective:   VITALS:   Vitals:   07/14/21 0107 07/14/21 0107 07/14/21 0454 07/14/21 0731  BP: (!) 165/93 (!) 165/93 (!) 173/85 (!) 160/85  Pulse: 84 85 85 80  Resp: 17 17 20 17   Temp: 98.8 F (37.1 C) 98.8 F (37.1 C) 97.6 F (36.4 C) 98.6 F (37 C)  TempSrc: Oral Oral Oral Oral  SpO2: 96% 96% 96% 91%  Weight:      Height:        Gen: Resting comfortably, NAD Pulm: Nl WOB on RA CV: Extremities warm and well perfused R hand: wound clean and dry, no drainage or surrounding erythema,  sensation and motor limited by nerve block    Lab Results  Component Value Date   WBC 6.7 07/14/2021   HGB 14.7 07/14/2021   HCT 44.4 07/14/2021   MCV 89.5 07/14/2021   PLT 250 07/14/2021     Assessment/Plan:  1 Day Post-Op s/p R hand I&D for fourth web space collar button abscess  - Intraop cultures growing GPCs, will send home on PO doxycycline - Dressing changed today, discussed home wound care with patient; BID soaks in diluted Hibiclens solution, pat dry, apply dry dressing - Will see next week for a wound check - Pt anxious to be dc'd home today    Catherine Espinoza 07/14/2021, 10:42 AM [phone]

## 2021-07-14 NOTE — Progress Notes (Signed)
Discharge summary packet provided to pt with instructions. Pt verbalized understanding of instructions.  Pt d/c to home as ordered. No complaints . Pt remains alert/oriented in no acute distress. Family is responsible for her ride.

## 2021-07-15 MED FILL — Insulin Degludec Soln Pen-Injector 100 Unit/ML: SUBCUTANEOUS | 30 days supply | Qty: 15 | Fill #1 | Status: AC

## 2021-07-16 ENCOUNTER — Other Ambulatory Visit (HOSPITAL_BASED_OUTPATIENT_CLINIC_OR_DEPARTMENT_OTHER): Payer: Self-pay

## 2021-07-18 LAB — AEROBIC/ANAEROBIC CULTURE W GRAM STAIN (SURGICAL/DEEP WOUND)

## 2021-07-20 ENCOUNTER — Ambulatory Visit (INDEPENDENT_AMBULATORY_CARE_PROVIDER_SITE_OTHER): Payer: No Typology Code available for payment source | Admitting: Orthopedic Surgery

## 2021-07-20 ENCOUNTER — Encounter: Payer: Self-pay | Admitting: Orthopedic Surgery

## 2021-07-20 VITALS — BP 140/88 | HR 85 | Wt 250.0 lb

## 2021-07-20 DIAGNOSIS — L02511 Cutaneous abscess of right hand: Secondary | ICD-10-CM

## 2021-07-20 NOTE — Progress Notes (Signed)
Post-Op Visit Note   Patient: Catherine Espinoza           Date of Birth: 10-19-1975           MRN: [id number] Visit Date: 07/20/2021 PCP: [name], NP   Assessment & Plan:  Chief Complaint:  Chief Complaint  Patient presents with   Right Hand - Post-op Follow-up   Visit Diagnoses:  1. Abscess of right hand     Plan: Her incisions are healing well.  They were left open.  She has been doing BID soaks in diluted Hibiclens solution.  There is no erythema or drainage from the incisions.  She has no pain today.  Her swelling has resolved.  She has no concerns today.  She can continue doing the Hibiclens soaks until the wounds completely heal.  She can follow up with me again as needed.   Follow-Up Instructions: No follow-ups on file.   Orders:  No orders of the defined types were placed in this encounter.  No orders of the defined types were placed in this encounter.   Imaging: No results found.  PMFS History: Patient Active Problem List   Diagnosis Date Noted   Abscess of right hand 07/13/2021   Abscess, hand 07/11/2021   CAD (coronary artery disease)    Coronary artery disease involving native coronary artery of native heart with unstable angina pectoris ([place])    Acute CHF (congestive heart failure) ([place]) 10/12/2020   Preventative health care 06/21/2016   GERD (gastroesophageal reflux disease) 12/25/2014   Onychomycosis 08/11/2014   Cellulitis 10/11/2013   HTN (hypertension) 10/11/2013   Hidradenitis suppurativa 08/02/2011   Obstructive sleep apnea 02/15/2011   Hypertension 02/15/2011   Hyperlipidemia 01/16/2011   PROTEINURIA 11/16/2009   Uncontrolled type 2 diabetes mellitus with hyperglycemia, with long-term current use of insulin ([place]) 11/03/2009   ANEMIA 11/03/2009   MORBID OBESITY 10/31/2009   TOBACCO ABUSE 10/31/2009   Past Medical History:  Diagnosis Date   Anemia, unspecified    CAD (coronary artery disease)    Chronic left-sided thoracic back  pain    Excessive or frequent menstruation    Morbid obesity ([place])    Obstructive sleep apnea 02/11/2011   Sleep study: severe OSA- rec CPAP 20cm small  full face mask   Proteinuria    Thoracic radiculopathy    Type II or unspecified type diabetes mellitus without mention of complication, not stated as uncontrolled    Unspecified essential hypertension     Family History  Problem Relation Age of Onset   Heart attack Maternal Aunt    Diabetes Mother    Cancer Father        oral cancer    Past Surgical History:  Procedure Laterality Date   ABDOMINAL HYSTERECTOMY     ABLATION COLPOCLESIS     CESAREAN SECTION     CLEFT PALATE REPAIR     CORONARY STENT INTERVENTION N/A 10/13/2020   Procedure: CORONARY STENT INTERVENTION;  Surgeon: Burnell Blanks, MD;  Location: [facility];  Service: Cardiovascular;  Laterality: N/A;   cyst removal     ECTOPIC PREGNANCY SURGERY     x 2   I & D EXTREMITY Right 07/13/2021   Procedure: IRRIGATION AND DEBRIDEMENT OF HAND;  Surgeon: Sherilyn Cooter, MD;  Location: [facility];  Service: Orthopedics;  Laterality: Right;   OOPHORECTOMY Right 2002   RIGHT/LEFT HEART CATH AND CORONARY ANGIOGRAPHY N/A 10/13/2020   Procedure: RIGHT/LEFT HEART CATH AND CORONARY ANGIOGRAPHY;  Surgeon:  Bensimhon, Shaune Pascal, MD;  Location: [facility];  Service: Cardiovascular;  Laterality: N/A;   Social History   Occupational History   Occupation: cna/med Engineer, production: [employer]  Tobacco Use   Smoking status: Every Day    Packs/day: 0.50    Years: 10.00    Pack years: 5.00    Types: Cigarettes   Smokeless tobacco: Never  Vaping Use   Vaping Use: Never used  Substance and Sexual Activity   Alcohol use: Yes    Alcohol/week: 0.0 standard drinks    Comment: occasional use   Drug use: No   Sexual activity: Yes    Birth control/protection: Surgical

## 2021-08-02 ENCOUNTER — Other Ambulatory Visit (HOSPITAL_BASED_OUTPATIENT_CLINIC_OR_DEPARTMENT_OTHER): Payer: Self-pay

## 2021-08-14 LAB — FUNGUS CULTURE WITH STAIN

## 2021-08-14 LAB — FUNGAL ORGANISM REFLEX

## 2021-08-14 LAB — FUNGUS CULTURE RESULT

## 2021-08-19 ENCOUNTER — Other Ambulatory Visit (HOSPITAL_COMMUNITY): Payer: Self-pay | Admitting: Family Medicine

## 2021-08-19 MED FILL — Furosemide Tab 40 MG: ORAL | 30 days supply | Qty: 30 | Fill #3 | Status: AC

## 2021-08-20 ENCOUNTER — Other Ambulatory Visit (HOSPITAL_BASED_OUTPATIENT_CLINIC_OR_DEPARTMENT_OTHER): Payer: Self-pay

## 2021-08-20 MED ORDER — DAPAGLIFLOZIN PROPANEDIOL 10 MG PO TABS
ORAL_TABLET | Freq: Every day | ORAL | 2 refills | Status: DC
  Filled 2021-08-20: qty 30, 30d supply, fill #0
  Filled 2021-10-13: qty 30, 30d supply, fill #1

## 2021-08-20 MED ORDER — SPIRONOLACTONE 25 MG PO TABS
ORAL_TABLET | Freq: Every day | ORAL | 2 refills | Status: DC
  Filled 2021-08-20: qty 30, 30d supply, fill #0
  Filled 2021-10-13: qty 30, 30d supply, fill #1
  Filled 2021-12-24: qty 30, 30d supply, fill #2

## 2021-08-20 MED ORDER — ROSUVASTATIN CALCIUM 40 MG PO TABS
ORAL_TABLET | Freq: Every day | ORAL | 2 refills | Status: DC
  Filled 2021-08-20: qty 30, 30d supply, fill #0
  Filled 2021-10-13: qty 30, 30d supply, fill #1
  Filled 2021-12-24: qty 30, 30d supply, fill #2

## 2021-08-20 MED ORDER — CLOPIDOGREL BISULFATE 75 MG PO TABS
ORAL_TABLET | Freq: Every day | ORAL | 2 refills | Status: DC
  Filled 2021-08-20: qty 30, 30d supply, fill #0
  Filled 2021-10-13: qty 30, 30d supply, fill #1
  Filled 2021-12-24: qty 30, 30d supply, fill #2

## 2021-09-06 ENCOUNTER — Other Ambulatory Visit (HOSPITAL_BASED_OUTPATIENT_CLINIC_OR_DEPARTMENT_OTHER): Payer: Self-pay

## 2021-09-06 MED FILL — Insulin Degludec Soln Pen-Injector 100 Unit/ML: SUBCUTANEOUS | 30 days supply | Qty: 15 | Fill #2 | Status: AC

## 2021-09-21 ENCOUNTER — Encounter (HOSPITAL_BASED_OUTPATIENT_CLINIC_OR_DEPARTMENT_OTHER): Payer: Self-pay | Admitting: Family

## 2021-09-25 ENCOUNTER — Other Ambulatory Visit (HOSPITAL_BASED_OUTPATIENT_CLINIC_OR_DEPARTMENT_OTHER): Payer: Self-pay

## 2021-09-25 ENCOUNTER — Ambulatory Visit (HOSPITAL_BASED_OUTPATIENT_CLINIC_OR_DEPARTMENT_OTHER): Payer: No Typology Code available for payment source | Admitting: Cardiovascular Disease

## 2021-10-13 MED FILL — Furosemide Tab 40 MG: ORAL | 30 days supply | Qty: 30 | Fill #4 | Status: AC

## 2021-10-15 ENCOUNTER — Other Ambulatory Visit (HOSPITAL_BASED_OUTPATIENT_CLINIC_OR_DEPARTMENT_OTHER): Payer: Self-pay

## 2021-10-15 ENCOUNTER — Other Ambulatory Visit (HOSPITAL_COMMUNITY): Payer: Self-pay | Admitting: Internal Medicine

## 2021-10-15 MED ORDER — EMPAGLIFLOZIN 10 MG PO TABS
10.0000 mg | ORAL_TABLET | Freq: Every day | ORAL | 6 refills | Status: DC
  Filled 2021-10-15: qty 30, 30d supply, fill #0

## 2021-10-15 NOTE — Telephone Encounter (Signed)
Spoke with patient to inform her that her insurance will not cover her farxgia and that we will change it to [place] which is covered.   Pt aware, agreeable, and verbalized understanding

## 2021-10-17 ENCOUNTER — Other Ambulatory Visit (HOSPITAL_BASED_OUTPATIENT_CLINIC_OR_DEPARTMENT_OTHER): Payer: Self-pay

## 2021-12-04 ENCOUNTER — Other Ambulatory Visit (HOSPITAL_BASED_OUTPATIENT_CLINIC_OR_DEPARTMENT_OTHER): Payer: Self-pay

## 2021-12-04 ENCOUNTER — Encounter: Payer: Self-pay | Admitting: Internal Medicine

## 2021-12-04 ENCOUNTER — Other Ambulatory Visit: Payer: Self-pay

## 2021-12-04 ENCOUNTER — Ambulatory Visit (INDEPENDENT_AMBULATORY_CARE_PROVIDER_SITE_OTHER): Payer: No Typology Code available for payment source | Admitting: Internal Medicine

## 2021-12-04 VITALS — BP 144/82 | HR 95 | Ht 65.0 in | Wt 250.8 lb

## 2021-12-04 DIAGNOSIS — E1159 Type 2 diabetes mellitus with other circulatory complications: Secondary | ICD-10-CM | POA: Diagnosis not present

## 2021-12-04 DIAGNOSIS — Z794 Long term (current) use of insulin: Secondary | ICD-10-CM

## 2021-12-04 DIAGNOSIS — E782 Mixed hyperlipidemia: Secondary | ICD-10-CM | POA: Insufficient documentation

## 2021-12-04 DIAGNOSIS — E119 Type 2 diabetes mellitus without complications: Secondary | ICD-10-CM | POA: Insufficient documentation

## 2021-12-04 DIAGNOSIS — E1165 Type 2 diabetes mellitus with hyperglycemia: Secondary | ICD-10-CM | POA: Diagnosis not present

## 2021-12-04 DIAGNOSIS — R739 Hyperglycemia, unspecified: Secondary | ICD-10-CM

## 2021-12-04 LAB — POCT GLYCOSYLATED HEMOGLOBIN (HGB A1C): Hemoglobin A1C: 9 % — AB (ref 4.0–5.6)

## 2021-12-04 LAB — POCT GLUCOSE (DEVICE FOR HOME USE): Glucose Fasting, POC: 263 mg/dL — AB (ref 70–99)

## 2021-12-04 MED ORDER — FREESTYLE LIBRE 2 SENSOR MISC
3 refills | Status: DC
  Filled 2021-12-04: qty 6, 84d supply, fill #0
  Filled 2022-05-01: qty 2, 28d supply, fill #1
  Filled 2022-06-21: qty 1, 14d supply, fill #2
  Filled 2022-07-23: qty 2, 28d supply, fill #3
  Filled 2022-10-16: qty 2, 28d supply, fill #4

## 2021-12-04 MED ORDER — INSULIN DEGLUDEC 100 UNIT/ML ~~LOC~~ SOPN
40.0000 [IU] | PEN_INJECTOR | Freq: Every day | SUBCUTANEOUS | 6 refills | Status: DC
  Filled 2021-12-04: qty 30, 75d supply, fill #0

## 2021-12-04 MED ORDER — INSULIN PEN NEEDLE 32G X 6 MM MISC
1.0000 | Freq: Every day | 3 refills | Status: AC
  Filled 2021-12-04: qty 100, 90d supply, fill #0

## 2021-12-04 MED ORDER — RYBELSUS 7 MG PO TABS
7.0000 mg | ORAL_TABLET | Freq: Every day | ORAL | 1 refills | Status: DC
  Filled 2021-12-04: qty 90, 90d supply, fill #0

## 2021-12-04 MED ORDER — EMPAGLIFLOZIN 25 MG PO TABS
25.0000 mg | ORAL_TABLET | Freq: Every day | ORAL | 3 refills | Status: DC
  Filled 2021-12-04: qty 90, 90d supply, fill #0
  Filled 2022-03-07: qty 90, 90d supply, fill #1

## 2021-12-04 MED ORDER — TOUJEO MAX SOLOSTAR 300 UNIT/ML ~~LOC~~ SOPN
40.0000 [IU] | PEN_INJECTOR | Freq: Every day | SUBCUTANEOUS | 3 refills | Status: DC
  Filled 2021-12-04: qty 12, 90d supply, fill #0
  Filled 2022-03-07: qty 12, 90d supply, fill #1

## 2021-12-04 NOTE — Patient Instructions (Signed)
-   Decrease Tresiba to 40 units daily  ?- Increase Jardiance 25 mg, 1 tablet every morning  ?- Start Rybelsus 3 mg , 1 tablet every morning for a month, then increase to 7 mg daily  ? ? ? ?HOW TO TREAT LOW BLOOD SUGARS (Blood sugar LESS THAN 70 MG/DL) ?Please follow the RULE OF 15 for the treatment of hypoglycemia treatment (when your (blood sugars are less than 70 mg/dL)  ? ?STEP 1: Take 15 grams of carbohydrates when your blood sugar is low, which includes:  ?3-4 GLUCOSE TABS  OR ?3-4 OZ OF JUICE OR REGULAR SODA OR ?ONE TUBE OF GLUCOSE GEL   ? ?STEP 2: RECHECK blood sugar in 15 MINUTES ?STEP 3: If your blood sugar is still low at the 15 minute recheck --> then, go back to STEP 1 and treat AGAIN with another 15 grams of carbohydrates. ? ?

## 2021-12-04 NOTE — Progress Notes (Signed)
Name: Catherine Espinoza  MRN/ DOB: [id number], [birth year]   Age/ Sex: 46 y.o., female    PCP: [name], NP   Reason for Endocrinology Evaluation: Type 2 Diabetes Mellitus     Date of Initial Endocrinology Visit: 12/04/2021     PATIENT IDENTIFIER: Catherine Espinoza is a 46 y.o. female with a past medical history of T2DM, HTN, CAD and CHF. The patient presented for initial endocrinology clinic visit on 12/04/2021 for consultative assistance with her diabetes management.    HPI: Ms. Diop was    Diagnosed with DM 2011 Prior Medications tried/Intolerance: Metformin -GI intolerance Currently checking blood sugars multiple x / day, through CGM Hypoglycemia episodes : Yes               symptoms: Yes                 Frequency: 2/week Hemoglobin A1c has ranged from 9.4% in 2012, peaking at 14.9% in 2019.  Patient has required hospitalization within the last 1 year from hyper or hypoglycemia: No  In terms of diet, the patient does eat Doritos, chips, peanut butter and crackers instead of breakfast.  She does admit to drinking regular [facility]  She is a Designer, multimedia   HOME DIABETES REGIMEN: Jardiance 10 mg daily  Tresiba 50 units daily   Statin: yes ACE-I/ARB: yes Prior Diabetic Education: Yes    CONTINUOUS GLUCOSE MONITORING RECORD INTERPRETATION    Dates of Recording: 12/6-12/29/2022  Sensor description:freestyle libre  Results statistics:   CGM use % of time 14  Average and SD 169/36.9  Time in range    58    %  % Time Above 180 29  % Time above 250 12  % Time Below target 1    Glycemic patterns summary: BG's low to normal during the night and high during the day   Hyperglycemic episodes  postprandial  Hypoglycemic episodes occurred overnight   Overnight periods: trends down        DIABETIC COMPLICATIONS: Microvascular complications:   Denies: CKD, neuropathy, retinopathy Last eye exam: Completed years ago  Macrovascular complications:   CAD/CHF Denies:  PVD, CVA   PAST HISTORY: Past Medical History:  Past Medical History:  Diagnosis Date   Anemia, unspecified    CAD (coronary artery disease)    Chronic left-sided thoracic back pain    Excessive or frequent menstruation    Morbid obesity ([place])    Obstructive sleep apnea 02/11/2011   Sleep study: severe OSA- rec CPAP 20cm small  full face mask   Proteinuria    Thoracic radiculopathy    Type II or unspecified type diabetes mellitus without mention of complication, not stated as uncontrolled    Unspecified essential hypertension    Past Surgical History:  Past Surgical History:  Procedure Laterality Date   ABDOMINAL HYSTERECTOMY     ABLATION COLPOCLESIS     CESAREAN SECTION     CLEFT PALATE REPAIR     CORONARY STENT INTERVENTION N/A 10/13/2020   Procedure: CORONARY STENT INTERVENTION;  Surgeon: Burnell Blanks, MD;  Location: [facility];  Service: Cardiovascular;  Laterality: N/A;   cyst removal     ECTOPIC PREGNANCY SURGERY     x 2   I & D EXTREMITY Right 07/13/2021   Procedure: IRRIGATION AND DEBRIDEMENT OF HAND;  Surgeon: Sherilyn Cooter, MD;  Location: [facility];  Service: Orthopedics;  Laterality: Right;   OOPHORECTOMY Right 2002   RIGHT/LEFT HEART CATH AND CORONARY ANGIOGRAPHY N/A  10/13/2020   Procedure: RIGHT/LEFT HEART CATH AND CORONARY ANGIOGRAPHY;  Surgeon: Jolaine Artist, MD;  Location: [facility];  Service: Cardiovascular;  Laterality: N/A;    Social History:  reports that she has been smoking cigarettes. She has a 5.00 pack-year smoking history. She has never used smokeless tobacco. She reports current alcohol use. She reports that she does not use drugs. Family History:  Family History  Problem Relation Age of Onset   Heart attack Maternal Aunt    Diabetes Mother    Cancer Father        oral cancer     HOME MEDICATIONS: Allergies as of 12/04/2021       Reactions   Ibuprofen    REACTION: knots in mouth with SOB    Labetalol Nausea Only   Aspirin Other (See Comments)   Knots in mouth/ Higher doses        Medication List        Accurate as of December 04, 2021 12:35 PM. If you have any questions, ask your nurse or doctor.          STOP taking these medications    fluconazole 150 MG tablet Commonly known as: Diflucan Stopped by: Dorita Sciara, MD   Tyler Aas FlexTouch 100 UNIT/ML FlexTouch Pen Generic drug: insulin degludec Stopped by: Dorita Sciara, MD       TAKE these medications    acetaminophen 500 MG tablet Commonly known as: TYLENOL Take 1,000 mg by mouth every 6 (six) hours as needed for mild pain or moderate pain.   Bayer Microlet Lancets lancets Use as instructed to check blood sugar three times daily.  DX  E11.65   blood glucose meter kit and supplies Kit Dispense based on patient and insurance preference. Use up to four times daily as directed. (FOR ICD-9 250.00, 250.01).   carvedilol 12.5 MG tablet Commonly known as: COREG Take 1 & 1/2 tablets (18.75 mg total) by mouth 2 (two) times daily with a meal.   clopidogrel 75 MG tablet Commonly known as: PLAVIX TAKE 1 TABLET (75 MG TOTAL) BY MOUTH DAILY.   empagliflozin 25 MG Tabs tablet Commonly known as: Jardiance Take 1 tablet (25 mg total) by mouth daily before breakfast. What changed:  medication strength how much to take Changed by: Dorita Sciara, MD   Entresto 97-103 MG Generic drug: sacubitril-valsartan Take 1 tablet by mouth 2 (two) times daily.   FreeStyle Libre 2 Sensor Misc USE AS DIRECTED   furosemide 40 MG tablet Commonly known as: LASIX TAKE 1 TABLET (40 MG TOTAL) BY MOUTH DAILY. FOR EVERY 3 LB WEIGHT GAIN   Insulin Pen Needle 32G X 6 MM Misc Use as directed once daily in the afternoon Started by: Dorita Sciara, MD   Insulin Syringe 27G X 1/2" 0.5 ML Misc Use as directed   potassium chloride SA 20 MEQ tablet Commonly known as: KLOR-CON M TAKE 1 TABLET (20 MEQ  TOTAL) BY MOUTH DAILY.   rosuvastatin 40 MG tablet Commonly known as: CRESTOR TAKE 1 TABLET (40 MG TOTAL) BY MOUTH DAILY.   Rybelsus 7 MG Tabs Generic drug: Semaglutide Take 1 tablet by mouth daily. Started by: Dorita Sciara, MD   spironolactone 25 MG tablet Commonly known as: ALDACTONE TAKE 1 TABLET (25 MG TOTAL) BY MOUTH DAILY.   Toujeo Max SoloStar 300 UNIT/ML Solostar Pen Generic drug: insulin glargine (2 Unit Dial) Inject 40 Units into the skin daily in the afternoon. Started by:  Dorita Sciara, MD         ALLERGIES: Allergies  Allergen Reactions   Ibuprofen     REACTION: knots in mouth with SOB   Labetalol Nausea Only   Aspirin Other (See Comments)    Knots in mouth/ Higher doses      REVIEW OF SYSTEMS: A comprehensive ROS was conducted with the patient and is negative except as per HPI and below:  Review of Systems  Gastrointestinal:  Negative for diarrhea and nausea.  Genitourinary:  Negative for frequency.  Neurological:  Negative for tingling.  Endo/Heme/Allergies:  Negative for polydipsia.     OBJECTIVE:   VITAL SIGNS: BP (!) 144/82 (BP Location: Left Arm, Patient Position: Sitting, Cuff Size: Large)    Pulse 95    Ht _0  (1.651 m)    Wt 250 lb 12.8 oz (113.8 kg)    LMP 09/13/2011    SpO2 97%    BMI 41.74 kg/m    PHYSICAL EXAM:  General: Pt appears well and is in NAD  Neck: General: Supple without adenopathy or carotid bruits. Thyroid: Thyroid size normal.  No goiter or nodules appreciated.  Lungs: Clear with good BS bilat with no rales, rhonchi, or wheezes  Heart: RRR with normal S1 and S2 and no gallops; no murmurs; no rub  Abdomen: Normoactive bowel sounds, soft, nontender, without masses or organomegaly palpable  Extremities:  Lower extremities - No pretibial edema. No lesions.  Neuro: MS is good with appropriate affect, pt is alert and Ox3    DM foot exam: 12/04/2021  The skin of the feet is intact without sores or  ulcerations. The pedal pulses are 2+ on right and 2+ on left. The sensation is intact to a screening 5.07, 10 gram monofilament bilaterally    DATA REVIEWED:  Lab Results  Component Value Date   HGBA1C 9.0 (A) 12/04/2021   HGBA1C 10.7 (H) 07/04/2021   HGBA1C 13.5 (A) 10/04/2020   Lab Results  Component Value Date   MICROALBUR 6.6 (H) 06/21/2016   LDLCALC 134 (H) 10/24/2017   CREATININE 1.00 07/14/2021   Lab Results  Component Value Date   MICRALBCREAT 7.2 06/21/2016    Lab Results  Component Value Date   CHOL 250 (H) 07/04/2021   HDL 42.80 07/04/2021   LDLCALC 134 (H) 10/24/2017   LDLDIRECT 171.0 07/04/2021   TRIG 239.0 (H) 07/04/2021   CHOLHDL 6 07/04/2021        Latest Reference Range & Units 07/14/21 02:07  Sodium 135 - 145 mmol/L 129 (L)  Potassium 3.5 - 5.1 mmol/L 3.9  Chloride 98 - 111 mmol/L 99  CO2 22 - 32 mmol/L 22  Glucose 70 - 99 mg/dL 409 (H)  BUN 6 - 20 mg/dL 26 (H)  Creatinine 0.44 - 1.00 mg/dL 1.00  Calcium 8.9 - 10.3 mg/dL 8.7 (L)  Anion gap 5 - 15  8  Alkaline Phosphatase 38 - 126 U/L 81  Albumin 3.5 - 5.0 g/dL 2.6 (L)  AST 15 - 41 U/L 14 (L)  ALT 0 - 44 U/L 21  Total Protein 6.5 - 8.1 g/dL 6.4 (L)  Total Bilirubin 0.3 - 1.2 mg/dL 0.5  GFR, Estimated >60 mL/min >60    Latest Reference Range & Units 07/04/21 12:01  Total CHOL/HDL Ratio  6  Cholesterol 0 - 200 mg/dL 250 (H)  HDL Cholesterol >39.00 mg/dL 42.80  Direct LDL mg/dL 171.0  NonHDL  207.11  Triglycerides 0.0 - 149.0 mg/dL 239.0 (H)  VLDL  0.0 - 40.0 mg/dL 47.8 (H)     ASSESSMENT / PLAN / RECOMMENDATIONS:   1) Type 2 Diabetes Mellitus, poorly controlled, With macrovascular complications - Most recent A1c of 9.0%. Goal A1c < 7.0 %.    Plan: GENERAL: I have discussed with the patient the pathophysiology of diabetes. We went over the natural progression of the disease. We talked about both insulin resistance and insulin deficiency. We stressed the importance of lifestyle changes  including diet and exercise. I explained the complications associated with diabetes including retinopathy, nephropathy, neuropathy as well as increased risk of cardiovascular disease. We went over the benefit seen with glycemic control.   I explained to the patient that diabetic patients are at higher than normal risk for amputations.  We discussed the importance of limiting the amount of carbohydrates and avoiding sugar sweetened beverages She is interested in glimepiride, we did discuss risks versus the benefits we also discussed the risk and the benefits of GLP-1 agonist.  She is trying to avoid any more injections, and we opted to try Rybelsus, cautioned against GI side effects She was given Rybelsus samples of the milligrams today, and a prescription of the 7 mg will be sent to the pharmacy In review of her CGM the patient has been noted with hypoglycemia overnight, we will reduce basal insulin as below Per pharmacy Tyler Aas is not covered, will switch to Toujeo  MEDICATIONS: Take Toujeo 40 units daily Increase Jardiance 25 mg daily Start Rybelsus 3 mg daily, may increase to 7 mg after 1 month  EDUCATION / INSTRUCTIONS: BG monitoring instructions: Patient is instructed to check her blood sugars 3 times a day, before meals. Call [facility] if: BG persistently < 70 I reviewed the Rule of 15 for the treatment of hypoglycemia in detail with the patient. Literature supplied.   2) Diabetic complications:  Eye: Does not have known diabetic retinopathy.  Neuro/ Feet: Does not have known diabetic peripheral neuropathy. Renal: Patient does not have known baseline CKD. She is  on an ACEI/ARB at present.  3) Dyslipidemia/CAD :    -Patient is on statin therapy but her TG is elevated as well as LDL -Today we discussed cardiovascular benefits of statin therapy and I have encouraged her to continue -If LDL remains elevated and she has not seen cardiology before her next visit with  me I will add Zetia  Medication Continue rosuvastatin 40 mg daily    Signed electronically by: Mack Guise, MD  [facility]  [address] [place], [place] [address] Phone: [phone] FAX: [phone]   CC: [name], [address] [place] [address] Phone: [phone]  Fax: [phone]    Return to Endocrinology clinic as below: Future Appointments  Date Time Provider [place]  01/30/2022  3:15 PM Pixie Casino, MD [facility]  02/20/2022  2:00 PM Bensimhon, Shaune Pascal, MD [facility] None  04/08/2022  2:00 PM Vern Prestia, Melanie Crazier, MD [facility] None

## 2021-12-05 ENCOUNTER — Encounter: Payer: Self-pay | Admitting: Internal Medicine

## 2021-12-24 ENCOUNTER — Other Ambulatory Visit (HOSPITAL_COMMUNITY): Payer: Self-pay | Admitting: Family Medicine

## 2021-12-24 ENCOUNTER — Other Ambulatory Visit (HOSPITAL_BASED_OUTPATIENT_CLINIC_OR_DEPARTMENT_OTHER): Payer: Self-pay

## 2021-12-24 MED ORDER — ENTRESTO 97-103 MG PO TABS
1.0000 | ORAL_TABLET | Freq: Two times a day (BID) | ORAL | 0 refills | Status: DC
  Filled 2021-12-24 – 2022-03-07 (×2): qty 90, 45d supply, fill #0

## 2021-12-24 MED ORDER — FUROSEMIDE 40 MG PO TABS
40.0000 mg | ORAL_TABLET | Freq: Every day | ORAL | 0 refills | Status: DC
  Filled 2021-12-24: qty 30, 30d supply, fill #0

## 2021-12-25 ENCOUNTER — Other Ambulatory Visit (HOSPITAL_BASED_OUTPATIENT_CLINIC_OR_DEPARTMENT_OTHER): Payer: Self-pay

## 2022-01-01 ENCOUNTER — Other Ambulatory Visit (HOSPITAL_BASED_OUTPATIENT_CLINIC_OR_DEPARTMENT_OTHER): Payer: Self-pay

## 2022-01-02 ENCOUNTER — Other Ambulatory Visit (HOSPITAL_BASED_OUTPATIENT_CLINIC_OR_DEPARTMENT_OTHER): Payer: Self-pay

## 2022-01-30 ENCOUNTER — Ambulatory Visit: Payer: No Typology Code available for payment source | Admitting: Internal Medicine

## 2022-02-19 ENCOUNTER — Ambulatory Visit (INDEPENDENT_AMBULATORY_CARE_PROVIDER_SITE_OTHER): Payer: No Typology Code available for payment source | Admitting: Internal Medicine

## 2022-02-19 ENCOUNTER — Encounter: Payer: Self-pay | Admitting: Internal Medicine

## 2022-02-19 ENCOUNTER — Other Ambulatory Visit (HOSPITAL_COMMUNITY): Payer: Self-pay

## 2022-02-19 ENCOUNTER — Other Ambulatory Visit (HOSPITAL_BASED_OUTPATIENT_CLINIC_OR_DEPARTMENT_OTHER): Payer: Self-pay

## 2022-02-19 ENCOUNTER — Ambulatory Visit (HOSPITAL_COMMUNITY)
Admission: RE | Admit: 2022-02-19 | Discharge: 2022-02-19 | Disposition: A | Discharge status: Home / Self Care | Payer: No Typology Code available for payment source | Source: Ambulatory Visit | Attending: Internal Medicine | Admitting: Internal Medicine

## 2022-02-19 VITALS — BP 180/110 | HR 86 | Wt 283.0 lb

## 2022-02-19 VITALS — BP 166/98 | HR 85 | Ht 65.05 in | Wt 283.0 lb

## 2022-02-19 DIAGNOSIS — E785 Hyperlipidemia, unspecified: Secondary | ICD-10-CM

## 2022-02-19 DIAGNOSIS — F17210 Nicotine dependence, cigarettes, uncomplicated: Secondary | ICD-10-CM | POA: Diagnosis not present

## 2022-02-19 DIAGNOSIS — I5022 Chronic systolic (congestive) heart failure: Secondary | ICD-10-CM

## 2022-02-19 DIAGNOSIS — Z7902 Long term (current) use of antithrombotics/antiplatelets: Secondary | ICD-10-CM | POA: Insufficient documentation

## 2022-02-19 DIAGNOSIS — Z6841 Body Mass Index (BMI) 40.0 and over, adult: Secondary | ICD-10-CM | POA: Diagnosis not present

## 2022-02-19 DIAGNOSIS — I2510 Atherosclerotic heart disease of native coronary artery without angina pectoris: Secondary | ICD-10-CM

## 2022-02-19 DIAGNOSIS — Z7982 Long term (current) use of aspirin: Secondary | ICD-10-CM | POA: Diagnosis not present

## 2022-02-19 DIAGNOSIS — E7849 Other hyperlipidemia: Secondary | ICD-10-CM | POA: Diagnosis not present

## 2022-02-19 DIAGNOSIS — E1165 Type 2 diabetes mellitus with hyperglycemia: Secondary | ICD-10-CM

## 2022-02-19 DIAGNOSIS — I110 Hypertensive heart disease with heart failure: Secondary | ICD-10-CM | POA: Diagnosis present

## 2022-02-19 DIAGNOSIS — Z720 Tobacco use: Secondary | ICD-10-CM

## 2022-02-19 DIAGNOSIS — G4733 Obstructive sleep apnea (adult) (pediatric): Secondary | ICD-10-CM | POA: Insufficient documentation

## 2022-02-19 DIAGNOSIS — Z955 Presence of coronary angioplasty implant and graft: Secondary | ICD-10-CM | POA: Insufficient documentation

## 2022-02-19 DIAGNOSIS — I10 Essential (primary) hypertension: Secondary | ICD-10-CM

## 2022-02-19 MED ORDER — CARVEDILOL 12.5 MG PO TABS
25.0000 mg | ORAL_TABLET | Freq: Two times a day (BID) | ORAL | 11 refills | Status: DC
  Filled 2022-02-19: qty 60, 15d supply, fill #0
  Filled 2022-03-07: qty 60, 15d supply, fill #1
  Filled 2022-06-19: qty 60, 15d supply, fill #2
  Filled 2022-07-23: qty 60, 15d supply, fill #3

## 2022-02-19 MED ORDER — REPATHA SURECLICK 140 MG/ML ~~LOC~~ SOAJ
1.0000 | SUBCUTANEOUS | 11 refills | Status: DC
  Filled 2022-02-19: qty 2, 28d supply, fill #0
  Filled 2022-03-07 – 2022-03-12 (×2): qty 2, 28d supply, fill #1
  Filled 2022-04-11: qty 2, 28d supply, fill #2
  Filled 2022-05-28: qty 2, 28d supply, fill #3
  Filled 2022-06-19: qty 2, 28d supply, fill #4

## 2022-02-19 NOTE — Progress Notes (Signed)
Advanced Heart Failure Clinic Note   PCP: [name], NP HF Cardiologist: Dr. Haroldine Laws  Reason for visit/CC: Follow up for HF  HPI: Catherine Espinoza is a 46 yo female with T2DM, HTN, morbid obesity BMI 40, CAD with previous LAD stent, OSA and systolic HF  Presented to ED on 10/11/20 for leg swelling. Echo showed EF 20-25%. R/LHC completed; patient had PCI to LAD, distal RCA disease treated medically, preserved CO, elevated filling pressures. She was diuresed and started on GDMT. Her discharge weight was 220 lbs.  Here with her mother. Feeling better. Working FT at [employer]. BP tends to be 140s. Denies CP or SOB. No edema orthopnea or PND. Stressed out with work. Has never started CPAP  Echo 03/12/21 EF 40-45% Personally reviewed   Cardiac Studies  Echo 1/22: EF 20-25% with global hypokinesis, mild LVH, grade I DD, RVSP 54, small pericardial effusion and IVC dilated w/ RAP 15.  R/LHC 1/22: RHC/LHC: PCI to LAD Prox RCA lesion is 30% stenosed. RPDA lesion is 95% stenosed. RPAV lesion is 99% stenosed. Prox LAD to Mid LAD lesion is 40% stenosed. Mid LAD lesion is 80% stenosed. 2nd Diag lesion is 50% stenosed.   Findings:  Ao = 147/97 (119) LV =  140/27 RA =  12 RV = 68/15 PA = 72/37 (51) PCW = 34 Fick cardiac output/index = 6.3/2.9 PVR = 2.6 WU FA sat = 97% PA sat = 72%, 72%  Assessment: 1. 2v CAD with high grade lesions in the mid LAD and distal RCA 2. 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 Markedly elevated filling pressures with normal cardiac output  Plan/Discussion: Plan PCI of LAD followed by aggressive diuresis and titration of GDMT  ROS: All systems reviewed and negative except as per HPI.   Past Medical History:  Diagnosis Date   Anemia, unspecified    CAD (coronary artery disease)    Chronic left-sided thoracic back pain    Excessive or frequent menstruation    Morbid obesity ([place])    Obstructive sleep apnea 02/11/2011    Sleep study: severe OSA- rec CPAP 20cm small  full face mask   Proteinuria    Thoracic radiculopathy    Type II or unspecified type diabetes mellitus without mention of complication, not stated as uncontrolled    Unspecified essential hypertension     Current Outpatient Medications  Medication Sig Dispense Refill   acetaminophen (TYLENOL) 500 MG tablet Take 1,000 mg by mouth every 6 (six) hours as needed for mild pain or moderate pain.     BAYER MICROLET LANCETS lancets Use as instructed to check blood sugar three times daily.  DX  E11.65 (Patient taking differently: Use as instructed to check blood sugar three times daily.  DX  E11.65) 100 each 5   blood glucose meter kit and supplies KIT Dispense based on patient and insurance preference. Use up to four times daily as directed. (FOR ICD-9 250.00, 250.01). 1 each 0   carvedilol (COREG) 12.5 MG tablet Take 1 & 1/2 tablets (18.75 mg total) by mouth 2 (two) times daily with a meal. 60 tablet 11   clopidogrel (PLAVIX) 75 MG tablet TAKE 1 TABLET (75 MG TOTAL) BY MOUTH DAILY. 30 tablet 2   Continuous Blood Gluc Sensor (FREESTYLE LIBRE 2 SENSOR) MISC USE AS DIRECTED 6 each 3   empagliflozin (JARDIANCE) 25 MG TABS tablet Take 1 tablet (25 mg total) by mouth daily before breakfast. 90 tablet 3   Evolocumab (REPATHA  SURECLICK) 062 MG/ML SOAJ Inject 1 Dose into the skin every 14 (fourteen) days. 2 mL 11   furosemide (LASIX) 40 MG tablet Take 1 tablet (40 mg total) by mouth daily. NEEDS FOLLOW UP APPOINTMENT FOR ANYMORE REFILLS 30 tablet 0   insulin glargine, 2 Unit Dial, (TOUJEO MAX SOLOSTAR) 300 UNIT/ML Solostar Pen Inject 40 Units into the skin daily in the afternoon. 15 mL 3   Insulin Pen Needle 32G X 6 MM MISC Use as directed once daily in the afternoon 100 each 3   Insulin Syringe 27G X 1/2" 0.5 ML MISC Use as directed 100 each 3   rosuvastatin (CRESTOR) 40 MG tablet TAKE 1 TABLET (40 MG TOTAL) BY MOUTH DAILY. 30 tablet 2   sacubitril-valsartan  (ENTRESTO) 97-103 MG Take 1 tablet by mouth 2 (two) times daily. NEEDS APPOINTMENT FOR ANY MORE REFILLS 90 tablet 0   Semaglutide (RYBELSUS) 7 MG TABS Take 1 tablet by mouth daily. 90 tablet 1   spironolactone (ALDACTONE) 25 MG tablet TAKE 1 TABLET (25 MG TOTAL) BY MOUTH DAILY. 30 tablet 2   No current facility-administered medications for this encounter.    Allergies  Allergen Reactions   Ibuprofen     REACTION: knots in mouth with SOB   Labetalol Nausea Only   Aspirin Other (See Comments)    Knots in mouth/ Higher doses     Social History   Socioeconomic History   Marital status: Single    Spouse name: Not on file   Number of children: 1   Years of education: Not on file   Highest education level: Associate degree: occupational, Hotel manager, or vocational program  Occupational History   Occupation: cna/med Engineer, production: [employer]  Tobacco Use   Smoking status: Every Day    Packs/day: 0.50    Years: 10.00    Pack years: 5.00    Types: Cigarettes   Smokeless tobacco: Never  Vaping Use   Vaping Use: Never used  Substance and Sexual Activity   Alcohol use: Yes    Alcohol/week: 0.0 standard drinks    Comment: occasional use   Drug use: No   Sexual activity: Yes    Birth control/protection: Surgical  Other Topics Concern   Not on file  Social History Narrative   Married-filing for divorced   Daughter born 2003   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 Strain: Not on file  Food Insecurity: Not on file  Transportation Needs: Not on file  Physical Activity: Not on file  Stress: Not on file  Social Connections: Not on file  Intimate Partner Violence: Not on file   Family History  Problem Relation Age of Onset   Heart attack Maternal Aunt    Diabetes Mother    Cancer Father        oral cancer   Vitals:   02/19/22 1413  BP: (!) 180/110  Pulse: 86  SpO2: 96%  Weight: 128.4 kg (283 lb)    Wt Readings from Last  3 Encounters:  02/19/22 128.4 kg (283 lb)  02/19/22 128.4 kg (283 lb)  12/04/21 113.8 kg (250 lb 12.8 oz)   PHYSICAL EXAM: General:  Well appearing. No resp difficulty HEENT: normal Neck: supple. no JVD. Carotids 2+ bilat; no bruits. No lymphadenopathy or thryomegaly appreciated. Cor: PMI nondisplaced. Regular rate & rhythm. No rubs, gallops or murmurs. Lungs: clear Abdomen: obese soft, nontender, nondistended. No hepatosplenomegaly. No bruits or masses. Good  bowel sounds. Extremities: no cyanosis, clubbing, rash, edema Neuro: alert & orientedx3, cranial nerves grossly intact. moves all 4 extremities w/o difficulty. Affect pleasant   ASSESSMENT & PLAN:  1.  Chronic Systolic Heart Failure - Echo 1/22 EF 20-25% with global hypokinesis, mild LVH, grade I DD, RVSP 54, small pericardial effusion and IVC dilated w/ RAP 15.  - Cath with LAD lesion Had PCI to LAD - CM felt to be mixed iCM and HTN - Echo 03/12/21 EF 40-45% - NYHA II - Volume status looks good Continue lasix 40 mg daily + 20 mEq of KCl. - Increase carvedilol to 25 bid - Continue spiro 25 mg daily. - Continue Farxiga 10 mg daily. No GU symptoms. - Continue Entresto 97/103 mg bid. - Repeat echo  2. CAD - S/P PCI LAD 1/22 - On Aspirin + crestor + plavix.  - Can stop Plavix - No s/s angina - Lipids being managed by Dr. Debara Pickett - Stressed need for smoking cessation, stress reduction and DM2 control  3. Uncontrolled T2DM - A1c 9.0% - Per Endo now on Semaglutide   4. Hypertension - Still elevated - Didn't take meds today yet - Increase carvedilol to 25 bid   5. Obesity - Body mass index is 36.69 kg/m. - Discussed need to lose weight  6. Tobacco use - Smoking 1/2 ppd - Encouraged her to quit - Continue semglutide  7. OSA - Repeat sleep study  8. Hyperlipidenmia - followed by Dr. Rosie Fate, MD 02/19/22

## 2022-02-19 NOTE — Patient Instructions (Addendum)
Medication Changes:  STOP Plavix.  INCREASE carvedilol to '25mg'$  twice a day. Refill has been sent to your pharmacy.   Testing/Procedures:  Your physician has requested that you have an echocardiogram. Echocardiography is a painless test that uses sound waves to create images of your heart. It provides your doctor with information about the size and shape of your heart and how well your heart's chambers and valves are working. This procedure takes approximately one hour. There are no restrictions for this procedure.   Your provider has recommended that you have a home sleep study.  We have provided you with the equipment in our office today. Please download the app and follow the instructions. YOUR PIN NUMBER IS: [id number]. Once you have completed the test you just dispose of the equipment, the information is automatically uploaded to [place] via blue-tooth technology. If your test is positive for sleep apnea and you need a home CPAP machine you will be contacted by Dr Theodosia Blender office [facility]) to set this up.  Special Instructions // Education:  Do the following things EVERYDAY: Weigh yourself in the morning before breakfast. Write it down and keep it in a log. Take your medicines as prescribed Eat low salt foods--Limit salt (sodium) to 2000 mg per day.  Stay as active as you can everyday Limit all fluids for the day to less than 2 liters  Follow-Up in: 4 months.   At the [facility], you and your health needs are our priority. We have a designated team specialized in the treatment of Heart Failure. This Care Team includes your primary Heart Failure Specialized Cardiologist (physician), Advanced Practice Providers (APPs- Physician Assistants and Nurse Practitioners), and Pharmacist who all work together to provide you with the care you need, when you need it.   You may see any of the following providers on your designated Care Team at your next follow up:  Dr Glori Bickers Dr Haynes Kerns, NP Lyda Jester, [place] [facility] [place], [place] Audry Riles, PharmD   Please be sure to bring in all your medications bottles to every appointment.   Need to Contact us:  If you have any questions or concerns before your next appointment please send [place] a message through [place] or call our office at [phone].    TO LEAVE A MESSAGE FOR THE NURSE SELECT OPTION 2, PLEASE LEAVE A MESSAGE INCLUDING: YOUR NAME DATE OF BIRTH CALL BACK NUMBER REASON FOR CALL**this is important as we prioritize the call backs  YOU WILL RECEIVE A CALL BACK THE SAME DAY AS LONG AS YOU CALL BEFORE 4:00 PM

## 2022-02-19 NOTE — Progress Notes (Signed)
LIPID CLINIC CONSULT NOTE  Chief Complaint:  Manage dyslipidemia  Primary Care Physician: [name], NP  Primary Cardiologist:  None  HPI:  Catherine Espinoza is a 46 y.o. female who is being seen today for the evaluation of dyslipidemia at the request of Pierre Bali, MD.  This a pleasant 46 year old female with an unfortunate history of heart disease.  She has a history of coronary artery disease and presented with heart failure symptoms.  Ultimately found to have two-vessel coronary artery disease with high-grade lesions in the mid LAD and distal RCA.  LVEF was less than 20%.  She underwent PCI to the LAD in January 2022 with subsequent improvement in LVEF to 40 to 45% in June 2022.  She has longstanding history of dyslipidemia, type 2 diabetes which initially was poorly controlled with A1c 13 to 14%, now below 10%.  Also she has had significant weight loss more than 30 to 40 pounds.  Her most recent lipid profile showed total cholesterol 250, triglycerides 239, HDL 42 and direct LDL 171.  This is on therapy with 40 mg rosuvastatin.  Therefore her untreated LDL cholesterol much greater than 190 suggestive of a familial hyperlipidemia.  Her family history is not clear for heart disease although she has a number of close relatives who have had early onset heart disease or sudden death.  She has worked to improve her diet significantly including reducing saturated fats and carbohydrates.  PMHx:  Past Medical History:  Diagnosis Date   Anemia, unspecified    CAD (coronary artery disease)    Chronic left-sided thoracic back pain    Excessive or frequent menstruation    Morbid obesity ([place])    Obstructive sleep apnea 02/11/2011   Sleep study: severe OSA- rec CPAP 20cm small  full face mask   Proteinuria    Thoracic radiculopathy    Type II or unspecified type diabetes mellitus without mention of complication, not stated as uncontrolled    Unspecified essential hypertension      Past Surgical History:  Procedure Laterality Date   ABDOMINAL HYSTERECTOMY     ABLATION COLPOCLESIS     CESAREAN SECTION     CLEFT PALATE REPAIR     CORONARY STENT INTERVENTION N/A 10/13/2020   Procedure: CORONARY STENT INTERVENTION;  Surgeon: Burnell Blanks, MD;  Location: [facility];  Service: Cardiovascular;  Laterality: N/A;   cyst removal     ECTOPIC PREGNANCY SURGERY     x 2   I & D EXTREMITY Right 07/13/2021   Procedure: IRRIGATION AND DEBRIDEMENT OF HAND;  Surgeon: Sherilyn Cooter, MD;  Location: [facility];  Service: Orthopedics;  Laterality: Right;   OOPHORECTOMY Right 2002   RIGHT/LEFT HEART CATH AND CORONARY ANGIOGRAPHY N/A 10/13/2020   Procedure: RIGHT/LEFT HEART CATH AND CORONARY ANGIOGRAPHY;  Surgeon: Jolaine Artist, MD;  Location: [facility];  Service: Cardiovascular;  Laterality: N/A;    FAMHx:  Family History  Problem Relation Age of Onset   Heart attack Maternal Aunt    Diabetes Mother    Cancer Father        oral cancer    SOCHx:   reports that she has been smoking cigarettes. She has a 5.00 pack-year smoking history. She has never used smokeless tobacco. She reports current alcohol use. She reports that she does not use drugs.  ALLERGIES:  Allergies  Allergen Reactions   Ibuprofen     REACTION: knots in mouth with SOB   Labetalol  Nausea Only   Aspirin Other (See Comments)    Knots in mouth/ Higher doses     ROS: Pertinent items noted in HPI and remainder of comprehensive ROS otherwise negative.  HOME MEDS: Current Outpatient Medications on File Prior to Visit  Medication Sig Dispense Refill   acetaminophen (TYLENOL) 500 MG tablet Take 1,000 mg by mouth every 6 (six) hours as needed for mild pain or moderate pain.     BAYER MICROLET LANCETS lancets Use as instructed to check blood sugar three times daily.  DX  E11.65 (Patient taking differently: Use as instructed to check blood sugar three times daily.  DX  E11.65) 100  each 5   blood glucose meter kit and supplies KIT Dispense based on patient and insurance preference. Use up to four times daily as directed. (FOR ICD-9 250.00, 250.01). 1 each 0   carvedilol (COREG) 12.5 MG tablet Take 1 & 1/2 tablets (18.75 mg total) by mouth 2 (two) times daily with a meal. 60 tablet 11   clopidogrel (PLAVIX) 75 MG tablet TAKE 1 TABLET (75 MG TOTAL) BY MOUTH DAILY. 30 tablet 2   Continuous Blood Gluc Sensor (FREESTYLE LIBRE 2 SENSOR) MISC USE AS DIRECTED 6 each 3   empagliflozin (JARDIANCE) 25 MG TABS tablet Take 1 tablet (25 mg total) by mouth daily before breakfast. 90 tablet 3   furosemide (LASIX) 40 MG tablet Take 1 tablet (40 mg total) by mouth daily. NEEDS FOLLOW UP APPOINTMENT FOR ANYMORE REFILLS 30 tablet 0   insulin glargine, 2 Unit Dial, (TOUJEO MAX SOLOSTAR) 300 UNIT/ML Solostar Pen Inject 40 Units into the skin daily in the afternoon. 15 mL 3   Insulin Pen Needle 32G X 6 MM MISC Use as directed once daily in the afternoon 100 each 3   Insulin Syringe 27G X 1/2" 0.5 ML MISC Use as directed 100 each 3   rosuvastatin (CRESTOR) 40 MG tablet TAKE 1 TABLET (40 MG TOTAL) BY MOUTH DAILY. 30 tablet 2   sacubitril-valsartan (ENTRESTO) 97-103 MG Take 1 tablet by mouth 2 (two) times daily. NEEDS APPOINTMENT FOR ANY MORE REFILLS 90 tablet 0   Semaglutide (RYBELSUS) 7 MG TABS Take 1 tablet by mouth daily. 90 tablet 1   spironolactone (ALDACTONE) 25 MG tablet TAKE 1 TABLET (25 MG TOTAL) BY MOUTH DAILY. 30 tablet 2   No current facility-administered medications on file prior to visit.    LABS/IMAGING: No results found for this or any previous visit (from the past 48 hour(s)). No results found.  LIPID PANEL:    Component Value Date/Time   CHOL 250 (H) 07/04/2021 1201   TRIG 239.0 (H) 07/04/2021 1201   HDL 42.80 07/04/2021 1201   CHOLHDL 6 07/04/2021 1201   VLDL 47.8 (H) 07/04/2021 1201   LDLCALC 134 (H) 10/24/2017 1406   LDLDIRECT 171.0 07/04/2021 1201    WEIGHTS: Wt  Readings from Last 3 Encounters:  02/19/22 283 lb (128.4 kg)  12/04/21 250 lb 12.8 oz (113.8 kg)  07/20/21 250 lb (113.4 kg)    VITALS: BP (!) 166/98   Pulse 85   Ht 5' 5.05" (1.652 m)   Wt 283 lb (128.4 kg)   LMP 09/13/2011   SpO2 96%   BMI 47.02 kg/m   EXAM: Deferred  EKG: N/A  ASSESSMENT: Probable familial hyperlipidemia, untreated LDL cholesterol greater than 190 Coronary artery disease status post PCI to the LAD (09/2020) 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   Ms. Strieter has a probable familial hyperlipidemia.  She is on 40 mg of rosuvastatin which revealed a 50% LDL reduction however her LDL remains high at 171.  This suggests more likely she has a familial hyperlipidemia and possibly a high LP(a).  The fact that she has had very early onset heart disease is also highly suggestive of this.  She has numerous other risk factors and would be considered high risk with a target LDL less than 55.  This may be difficult to achieve.  She will certainly need additional therapy and is a good candidate to add PCSK9 inhibitor to her rosuvastatin.  We will reach out for prior authorization for this.  Plan a repeat lipid NMR and LP(a) in about 3 months.  She may need additional therapy such as Nexletol or Nexlizet to reach target.  We did discuss the genetic nature of this and the fact that she should have her daughter who was in her 52s screened for cholesterol disorder.  In addition I offered genetic testing however she declined.  She is somewhat overwhelmed by the disease.  I asked her to reach out to heart failure clinic to see if perhaps she could benefit from some counseling as she feels like her heart is a "time bomb".  Thanks again for referring this very pleasant patient.  Pixie Casino, MD, [facility], [place] Director of the Advanced Lipid Disorders &  Cardiovascular Risk Reduction Clinic Diplomate of the American Board of Clinical Lipidology Attending Cardiologist  Direct Dial: [phone]  Fax: [phone]  Website:  [email or web address] 02/19/2022, 8:38 AM

## 2022-02-19 NOTE — Patient Instructions (Signed)
Medication Instructions:  Dr. Debara Pickett recommends Repatha '140mg'$ /mL (PCSK9). This is an injectable cholesterol medication self-administered once every 14 days. This medication will likely need prior approval with your insurance company, which we will work on. If the medication is not approved initially, we may need to do an appeal with your insurance.   Administer medication in area of fatty tissue such as abdomen, outer thigh, back of upper arm - and rotate site with each injection Store medication in refrigerator until ready to administer - allow to sit at room temp for 30 mins - 1 hour prior to injection Dispose of medication in a SHARPS container - your pharmacy should be able to direct you on this and proper disposal   If you need a co-pay card for Repatha: [email or web address]-[email or web address] >> paying for Repatha or red box that says "Repatha Copay Card" in top right If you need a co-pay card for Praluent: https://[email or web address]  Patient Assistance:    These foundations have funds at various times.   The PAN Foundation: https://www.panfoundation.org/disease-funds/hypercholesterolemia/ -- can sign up for wait list  The [facility] offers assistance to help pay for medication copays.  They will cover copays for all cholesterol lowering meds, including statins, fibrates, omega-3 fish oils like Vascepa, ezetimibe, Repatha, Praluent, Nexletol, Nexlizet.  The cards are usually good for $2,500 or 12 months, whichever comes first. Go to healthwellfoundation.org Click on "Apply Now" Answer questions as to whom is applying (patient or representative) Your disease fund will be "hypercholesterolemia - Medicare access" They will ask questions about finances and which medications you are taking for cholesterol When you submit, the approval is usually within minutes.  You will need to print the card information from the site You will need to show this information to your pharmacy, they will bill  your Medicare Part D plan first -then bill Health Well --for the copay.   You can also call them at [phone], although the hold times can be quite long.     *If you need a refill on your cardiac medications before your next appointment, please call your pharmacy*   Lab Work: FASTING lab work to check cholesterol 3-4 months -- NMR lipoprofile, LPa If you have labs (blood work) drawn today and your tests are completely normal, you will receive your results only by: [place] (if you have MyChart) OR A paper copy in the mail If you have any lab test that is abnormal or we need to change your treatment, we will call you to review the results.   Testing/Procedures: NONE   Follow-Up: At [facility], you and your health needs are our priority.  As part of our continuing mission to provide you with exceptional heart care, we have created designated Provider Care Teams.  These Care Teams include your primary Cardiologist (physician) and Advanced Practice Providers (APPs -  Physician Assistants and Nurse Practitioners) who all work together to provide you with the care you need, when you need it.  We recommend signing up for the patient portal called "MyChart".  Sign up information is provided on this After Visit Summary.  MyChart is used to connect with patients for Virtual Visits (Telemedicine).  Patients are able to view lab/test results, encounter notes, upcoming appointments, etc.  Non-urgent messages can be sent to your provider as well.   To learn more about what you can do with MyChart, go to [email or web address].    Your next appointment:   3-4 months with Dr. Debara Pickett -- lipid clinic - [facility]  or [facility] [employer] or video visit

## 2022-02-20 ENCOUNTER — Encounter (HOSPITAL_COMMUNITY): Payer: No Typology Code available for payment source | Admitting: Internal Medicine

## 2022-02-22 ENCOUNTER — Telehealth (HOSPITAL_COMMUNITY): Payer: Self-pay | Admitting: Surgery

## 2022-02-22 NOTE — Telephone Encounter (Signed)
-----   Message from Harvie Junior, [place] sent at 02/22/2022  3:36 PM EDT ----- Regarding: RE: Home sleep study No pre cert reqd ----- Message ----- From: Micki Riley, RN Sent: 02/19/2022   3:14 PM EDT To: Harvie Junior, CMA Subject: Home sleep study                               Does she need pre cert for home sleep study? Sent home with today.

## 2022-02-22 NOTE — Telephone Encounter (Signed)
Patient called and made aware that she can proceed with ordered home sleep study.

## 2022-02-27 ENCOUNTER — Encounter (INDEPENDENT_AMBULATORY_CARE_PROVIDER_SITE_OTHER): Payer: No Typology Code available for payment source | Admitting: Cardiology

## 2022-02-27 DIAGNOSIS — G4733 Obstructive sleep apnea (adult) (pediatric): Secondary | ICD-10-CM

## 2022-02-28 NOTE — Procedures (Signed)
   SLEEP STUDY REPORT Patient Information Study Date: 02/27/22 Patient Name: Catherine Espinoza Patient ID: [id number] Birth Date: 1975-10-31 Age: 46 Gender: Female Referring Physician:Daniel Bensimhon, MD  TEST DESCRIPTION: Home sleep apnea testing was completed using the WatchPat, a Type 1 device, utilizing peripheral arterial tonometry (PAT), chest movement, actigraphy, pulse oximetry, pulse rate, body position and snore. AHI was calculated with apnea and hypopnea using valid sleep time as the denominator. RDI includes apneas, hypopneas, and RERAs. The data acquired and the scoring of sleep and all associated events were performed in accordance with the recommended standards and specifications as outlined in the AASM Manual for the Scoring of Sleep and Associated Events 2.2.0 (2015).  FINDINGS: 1. Severe Obstructive Sleep Apnea with AHI 47.7/hr. 2. No significant Central Sleep Apnea with pAHIc 7.8/hr. 3. Oxygen desaturations as low as 69%. 4. Severe snoring was present. O2 sats were < 88% for 180.2 min. 5. Total sleep time was 8 hrs and 59 min. 6. 29.6% of total sleep time was spent in REM sleep. 7. Shortened sleep onset latency at 8 min. 8. Shortened REM sleep onset latency at 69 min. 9. Total awakenings were 18.  DIAGNOSIS: Severe Obstructive Sleep Apnea (G47.33) Nocturnal Hypoxemia  RECOMMENDATIONS: 1. Clinical correlation of these findings is necessary. The decision to treat obstructive sleep apnea (OSA) is usually based on the presence of apnea symptoms or the presence of associated medical conditions such as Hypertension, Congestive Heart Failure, Atrial Fibrillation or Obesity. The most common symptoms of OSA are snoring, gasping for breath while sleeping, daytime sleepiness and fatigue.  2. Initiating apnea therapy is recommended given the presence of symptoms and/or associated conditions. Recommend proceeding with one of the following:   a. Auto-CPAP therapy with a  pressure range of 5-20cm H2O.   b. An oral appliance (OA) that can be obtained from certain dentists with expertise in sleep medicine. These are primarily of use in non-obese patients with mild and moderate disease.   c. An ENT consultation which may be useful to look for specific causes of obstruction and possible treatment options.   d. If patient is intolerant to PAP therapy, consider referral to ENT for evaluation for hypoglossal nerve stimulator.  3. Close follow-up is necessary to ensure success with CPAP or oral appliance therapy for maximum benefit .  4. A follow-up oximetry study on CPAP is recommended to assess the adequacy of therapy and determine the need for supplemental oxygen or the potential need for Bi-level therapy. An arterial blood gas to determine the adequacy of baseline ventilation and oxygenation should also be considered.  5. Healthy sleep recommendations include: adequate nightly sleep (normal 7-9 hrs/night), avoidance of caffeine after noon and alcohol near bedtime, and maintaining a sleep environment that is cool, dark and quiet.  6. Weight loss for overweight patients is recommended. Even modest amounts of weight loss can significantly improve the severity of sleep apnea.  7. Snoring recommendations include: weight loss where appropriate, side sleeping, and avoidance of alcohol before bed.  8. Operation of motor vehicle should be avoided when sleepy.  Signature: Electronically Signed: 02/28/22 Fransico Him, MD; [facility]; [place], American Board of Sleep Medicine

## 2022-03-01 ENCOUNTER — Telehealth: Payer: Self-pay | Admitting: *Deleted

## 2022-03-01 ENCOUNTER — Other Ambulatory Visit: Payer: Self-pay | Admitting: Cardiology

## 2022-03-01 DIAGNOSIS — G4733 Obstructive sleep apnea (adult) (pediatric): Secondary | ICD-10-CM

## 2022-03-01 DIAGNOSIS — I5021 Acute systolic (congestive) heart failure: Secondary | ICD-10-CM

## 2022-03-01 DIAGNOSIS — I10 Essential (primary) hypertension: Secondary | ICD-10-CM

## 2022-03-01 NOTE — Telephone Encounter (Signed)
-----   Message from Sueanne Margarita, MD sent at 02/28/2022  7:44 PM EDT ----- Please let patient know that they have sleep apnea.  Recommend therapeutic CPAP titration for treatment of patient's sleep disordered breathing.  If unable to perform an in lab titration then initiate ResMed auto CPAP from 4 to 15cm H2O with heated humidity and mask of choice and overnight pulse ox on CPAP.

## 2022-03-01 NOTE — Telephone Encounter (Signed)
Patient notified of sleep study results and recommendations. She had no questions about sleep apnea. States that she  has had a CPAP in the past. "My machine is just old, and it's time for a new one." She will proceed with CPAP titration pending insurance approval.

## 2022-03-06 ENCOUNTER — Ambulatory Visit (HOSPITAL_COMMUNITY)
Admission: RE | Admit: 2022-03-06 | Discharge: 2022-03-06 | Disposition: A | Discharge status: Home / Self Care | Payer: No Typology Code available for payment source | Source: Ambulatory Visit | Attending: Internal Medicine | Admitting: Internal Medicine

## 2022-03-06 DIAGNOSIS — E785 Hyperlipidemia, unspecified: Secondary | ICD-10-CM | POA: Insufficient documentation

## 2022-03-06 DIAGNOSIS — I5022 Chronic systolic (congestive) heart failure: Secondary | ICD-10-CM | POA: Insufficient documentation

## 2022-03-06 DIAGNOSIS — E119 Type 2 diabetes mellitus without complications: Secondary | ICD-10-CM | POA: Diagnosis not present

## 2022-03-06 DIAGNOSIS — G4730 Sleep apnea, unspecified: Secondary | ICD-10-CM | POA: Insufficient documentation

## 2022-03-06 DIAGNOSIS — I110 Hypertensive heart disease with heart failure: Secondary | ICD-10-CM | POA: Diagnosis not present

## 2022-03-06 LAB — ECHOCARDIOGRAM COMPLETE
Area-P 1/2: 3.74 cm2
Calc EF: 41.8 %
S' Lateral: 3.8 cm
Single Plane A2C EF: 43.7 %
Single Plane A4C EF: 42.4 %

## 2022-03-07 ENCOUNTER — Other Ambulatory Visit (HOSPITAL_COMMUNITY): Payer: Self-pay | Admitting: Internal Medicine

## 2022-03-07 ENCOUNTER — Other Ambulatory Visit (HOSPITAL_COMMUNITY): Payer: Self-pay | Admitting: Family Medicine

## 2022-03-07 ENCOUNTER — Other Ambulatory Visit (HOSPITAL_BASED_OUTPATIENT_CLINIC_OR_DEPARTMENT_OTHER): Payer: Self-pay

## 2022-03-07 MED ORDER — FUROSEMIDE 40 MG PO TABS
40.0000 mg | ORAL_TABLET | Freq: Every day | ORAL | 3 refills | Status: DC
  Filled 2022-03-07: qty 90, 90d supply, fill #0

## 2022-03-07 MED ORDER — ROSUVASTATIN CALCIUM 40 MG PO TABS
ORAL_TABLET | Freq: Every day | ORAL | 3 refills | Status: DC
  Filled 2022-03-07: qty 90, 90d supply, fill #0

## 2022-03-07 MED ORDER — SPIRONOLACTONE 25 MG PO TABS
ORAL_TABLET | Freq: Every day | ORAL | 3 refills | Status: DC
  Filled 2022-03-07: qty 90, 90d supply, fill #0

## 2022-03-08 ENCOUNTER — Other Ambulatory Visit (HOSPITAL_BASED_OUTPATIENT_CLINIC_OR_DEPARTMENT_OTHER): Payer: Self-pay

## 2022-03-11 ENCOUNTER — Ambulatory Visit (HOSPITAL_BASED_OUTPATIENT_CLINIC_OR_DEPARTMENT_OTHER)
Admission: RE | Admit: 2022-03-11 | Discharge: 2022-03-11 | Disposition: A | Discharge status: Home / Self Care | Payer: No Typology Code available for payment source | Source: Ambulatory Visit | Attending: Family | Admitting: Family

## 2022-03-11 ENCOUNTER — Telehealth: Payer: Self-pay | Admitting: Family

## 2022-03-11 ENCOUNTER — Encounter: Payer: Self-pay | Admitting: Family

## 2022-03-11 ENCOUNTER — Other Ambulatory Visit (HOSPITAL_BASED_OUTPATIENT_CLINIC_OR_DEPARTMENT_OTHER): Payer: Self-pay

## 2022-03-11 ENCOUNTER — Telehealth: Payer: Self-pay | Admitting: *Deleted

## 2022-03-11 ENCOUNTER — Ambulatory Visit (INDEPENDENT_AMBULATORY_CARE_PROVIDER_SITE_OTHER): Payer: No Typology Code available for payment source | Admitting: Family

## 2022-03-11 VITALS — BP 177/80 | HR 82 | Temp 98.4°F | Resp 16 | Wt 283.0 lb

## 2022-03-11 DIAGNOSIS — R109 Unspecified abdominal pain: Secondary | ICD-10-CM | POA: Insufficient documentation

## 2022-03-11 DIAGNOSIS — I509 Heart failure, unspecified: Secondary | ICD-10-CM | POA: Diagnosis not present

## 2022-03-11 DIAGNOSIS — Z794 Long term (current) use of insulin: Secondary | ICD-10-CM

## 2022-03-11 DIAGNOSIS — E1159 Type 2 diabetes mellitus with other circulatory complications: Secondary | ICD-10-CM

## 2022-03-11 DIAGNOSIS — G4733 Obstructive sleep apnea (adult) (pediatric): Secondary | ICD-10-CM | POA: Diagnosis not present

## 2022-03-11 DIAGNOSIS — E785 Hyperlipidemia, unspecified: Secondary | ICD-10-CM

## 2022-03-11 DIAGNOSIS — F418 Other specified anxiety disorders: Secondary | ICD-10-CM

## 2022-03-11 DIAGNOSIS — R3129 Other microscopic hematuria: Secondary | ICD-10-CM

## 2022-03-11 DIAGNOSIS — F17200 Nicotine dependence, unspecified, uncomplicated: Secondary | ICD-10-CM

## 2022-03-11 DIAGNOSIS — I10 Essential (primary) hypertension: Secondary | ICD-10-CM

## 2022-03-11 LAB — POC URINALSYSI DIPSTICK (AUTOMATED)
Bilirubin, UA: NEGATIVE
Glucose, UA: POSITIVE — AB
Ketones, UA: NEGATIVE
Leukocytes, UA: NEGATIVE
Nitrite, UA: NEGATIVE
Protein, UA: POSITIVE — AB
Spec Grav, UA: 1.02 (ref 1.010–1.025)
Urobilinogen, UA: 0.2 E.U./dL
pH, UA: 6 (ref 5.0–8.0)

## 2022-03-11 MED ORDER — BUPROPION HCL ER (SR) 150 MG PO TB12
150.0000 mg | ORAL_TABLET | Freq: Two times a day (BID) | ORAL | 2 refills | Status: DC
  Filled 2022-03-11: qty 60, 30d supply, fill #0

## 2022-03-11 MED ORDER — TRAMADOL HCL 50 MG PO TABS
50.0000 mg | ORAL_TABLET | Freq: Three times a day (TID) | ORAL | 0 refills | Status: AC | PRN
  Filled 2022-03-11: qty 15, 5d supply, fill #0

## 2022-03-11 NOTE — Telephone Encounter (Signed)
KUB does not show any obvious kidney stone. Urinalysis does not suggest UTI but we will see what the urine culture shows.   She has a lot of blood in her urine. I would recommend that she see urology for further evaluation of this. Referral placed.   In regards to back pain,  unfortunately, she is allergic to NSAIDS and steroids will increase her sugar too much.  I would recommend that she try tramadol '50mg'$  3x daily as needed for the next 5 days, then transition over to tylenol.

## 2022-03-11 NOTE — Assessment & Plan Note (Signed)
On repatha per lipid clinic.

## 2022-03-11 NOTE — Patient Instructions (Signed)
Complete x-ray on first floor. Start wellbutrin.

## 2022-03-11 NOTE — Assessment & Plan Note (Addendum)
Continues to smoke.  Trial of wellbutin.

## 2022-03-11 NOTE — Progress Notes (Addendum)
Subjective:   By signing my name below, I, Catherine Espinoza, attest that this documentation has been prepared under the direction and in the presence of Catherine Alar, NP 03/11/2022    Patient ID: Catherine Espinoza, female    DOB: 1976-07-18, 46 y.o.   MRN: [id number]  Chief Complaint  Patient presents with   Flank Pain    Complains of right flank pain for about 10 days. No nausea or vomiting.     Flank Pain Pertinent negatives include no dysuria.   Patient is in today for a office visit.   She complains of 8/10 right side flank pain for the past 10 days. She denies having any burning, fever, nausea or vomiting. Her pain radiates to her right abdomen. She had muscular back pain in the past and notes her current pain feels different. She had her gallbladder surgically removed in the past.  She reports having anxiety due to her congestive heart failure. She is interested in seeing a counselor to manage her anxiety.  She continues smoking cigarettes regularly at this time. She has tried switching to vape products but found it was too strong and stopped. She has tried chantix in the past but found she had nightmares while on it. She has tried Wellbutrin to help with smoking cessation but stopped due to being told it was bad for her liver. She has reduced the amount of cigarettes she typically smokes daily.  She continues seeing her cardiologist to manage it. She reports finding improvements during her lab work last week. She continues taking 40 mg lasix daily PO and reports no new issues while using it. She continues taking repatha injections and reports no new issues while using it.  She reports no new issues with sleep but found she had sleep apnea during her recent sleep study. She has a CPAP machine at home but has not used it in a while.    Health Maintenance Due  Topic Date Due   Hepatitis C Screening  Never done   OPHTHALMOLOGY EXAM  06/26/2017   FOOT EXAM  02/05/2019   COVID-19  Vaccine (3 - Moderna series) 01/14/2020   COLONOSCOPY (Pts 45-55yr Insurance coverage will need to be confirmed)  Never done    Past Medical History:  Diagnosis Date   Abscess of right hand 07/13/2021   Anemia, unspecified    CAD (coronary artery disease)    Chronic left-sided thoracic back pain    Excessive or frequent menstruation    Morbid obesity [place]    Obstructive sleep apnea 02/11/2011   Sleep study: severe OSA- rec CPAP 20cm small  full face mask   Proteinuria    Thoracic radiculopathy    Type II or unspecified type diabetes mellitus without mention of complication, not stated as uncontrolled    Unspecified essential hypertension     Past Surgical History:  Procedure Laterality Date   ABDOMINAL HYSTERECTOMY     ABLATION COLPOCLESIS     CESAREAN SECTION     CLEFT PALATE REPAIR     CORONARY STENT INTERVENTION N/A 10/13/2020   Procedure: CORONARY STENT INTERVENTION;  Surgeon: MBurnell Blanks MD;  [facility];  Service: Cardiovascular;  Laterality: N/A;   cyst removal     ECTOPIC PREGNANCY SURGERY     x 2   I & D EXTREMITY Right 07/13/2021   Procedure: IRRIGATION AND DEBRIDEMENT OF HAND;  Surgeon: BSherilyn Cooter MD;  [facility]  Service: Orthopedics;  Laterality: Right;  OOPHORECTOMY Right 2002   RIGHT/LEFT HEART CATH AND CORONARY ANGIOGRAPHY N/A 10/13/2020   Procedure: RIGHT/LEFT HEART CATH AND CORONARY ANGIOGRAPHY;  Surgeon: Jolaine Artist, MD;  Location: [facility];  Service: Cardiovascular;  Laterality: N/A;    Family History  Problem Relation Age of Onset   Heart attack Maternal Aunt    Diabetes Mother    Cancer Father        oral cancer    Social History   Socioeconomic History   Marital status: Single    Spouse name: Not on file   Number of children: 1   Years of education: Not on file   Highest education level: Associate degree: occupational, Hotel manager, or vocational program  Occupational History    Occupation: cna/med Engineer, production: [employer]  Tobacco Use   Smoking status: Every Day    Packs/day: 0.50    Years: 10.00    Total pack years: 5.00    Types: Cigarettes   Smokeless tobacco: Never  Vaping Use   Vaping Use: Never used  Substance and Sexual Activity   Alcohol use: Yes    Alcohol/week: 0.0 standard drinks of alcohol    Comment: occasional use   Drug use: No   Sexual activity: Yes    Birth control/protection: Surgical  Other Topics Concern   Not on file  Social History Narrative   Married-filing for divorced   Daughter born 2003   Works as cna/med Designer, multimedia   Social Determinants of Health   Financial Resource Strain: High Risk (10/12/2020)   Overall Financial Resource Strain (CARDIA)    Difficulty of Paying Living Expenses: Hard  Food Insecurity: No Food Insecurity (10/12/2020)   Hunger Vital Sign    Worried About Running Out of Food in the Last Year: Never true    [place] in the Last Year: Never true  Transportation Needs: No Transportation Needs (10/12/2020)   PRAPARE - Hydrologist (Medical): No    Lack of Transportation (Non-Medical): No  Physical Activity: Not on file  Stress: Not on file  Social Connections: Not on file  Intimate Partner Violence: Not At Risk (10/12/2020)   Humiliation, Afraid, Rape, and Kick questionnaire    Fear of Current or Ex-Partner: No    Emotionally Abused: No    Physically Abused: No    Sexually Abused: No    Outpatient Medications Prior to Visit  Medication Sig Dispense Refill   acetaminophen (TYLENOL) 500 MG tablet Take 1,000 mg by mouth every 6 (six) hours as needed for mild pain or moderate pain.     BAYER MICROLET LANCETS lancets Use as instructed to check blood sugar three times daily.  DX  E11.65 (Patient taking differently: Use as instructed to check blood sugar three times daily.  DX  E11.65) 100 each 5   blood glucose meter kit and supplies KIT Dispense based on patient and  insurance preference. Use up to four times daily as directed. (FOR ICD-9 250.00, 250.01). 1 each 0   carvedilol (COREG) 12.5 MG tablet Take 2 tablets (25 mg total) by mouth 2 (two) times daily with a meal. 60 tablet 11   Continuous Blood Gluc Sensor (FREESTYLE LIBRE 2 SENSOR) MISC USE AS DIRECTED 6 each 3   empagliflozin (JARDIANCE) 25 MG TABS tablet Take 1 tablet (25 mg total) by mouth daily before breakfast. 90 tablet 3   Evolocumab (REPATHA SURECLICK) 621 MG/ML SOAJ Inject 1 Dose into the  skin every 14 (fourteen) days. 2 mL 11   furosemide (LASIX) 40 MG tablet Take 1 tablet (40 mg total) by mouth daily. 90 tablet 3   insulin glargine, 2 Unit Dial, (TOUJEO MAX SOLOSTAR) 300 UNIT/ML Solostar Pen Inject 40 Units into the skin daily in the afternoon. 15 mL 3   Insulin Pen Needle 32G X 6 MM MISC Use as directed once daily in the afternoon 100 each 3   Insulin Syringe 27G X 1/2" 0.5 ML MISC Use as directed 100 each 3   rosuvastatin (CRESTOR) 40 MG tablet TAKE 1 TABLET (40 MG TOTAL) BY MOUTH DAILY. 90 tablet 3   sacubitril-valsartan (ENTRESTO) 97-103 MG Take 1 tablet by mouth 2 (two) times daily. NEEDS APPOINTMENT FOR ANY MORE REFILLS 90 tablet 0   Semaglutide (RYBELSUS) 7 MG TABS Take 1 tablet by mouth daily. 90 tablet 1   spironolactone (ALDACTONE) 25 MG tablet TAKE 1 TABLET (25 MG TOTAL) BY MOUTH DAILY. 90 tablet 3   No facility-administered medications prior to visit.    Allergies  Allergen Reactions   Ibuprofen     REACTION: knots in mouth with SOB   Labetalol Nausea Only   Aspirin Other (See Comments)    Knots in mouth/ Higher doses     Review of Systems  Gastrointestinal:  Negative for nausea and vomiting.  Genitourinary:  Positive for flank pain (right). Negative for dysuria.  Psychiatric/Behavioral:  The patient is not nervous/anxious.        Objective:    Physical Exam Constitutional:      General: She is not in acute distress.    Appearance: Normal appearance. She is not  ill-appearing.  HENT:     Head: Normocephalic and atraumatic.     Right Ear: External ear normal.     Left Ear: External ear normal.  Eyes:     Extraocular Movements: Extraocular movements intact.     Pupils: Pupils are equal, round, and reactive to light.  Cardiovascular:     Rate and Rhythm: Normal rate and regular rhythm.     Heart sounds: Normal heart sounds. No murmur heard.    No gallop.  Pulmonary:     Effort: Pulmonary effort is normal. No respiratory distress.     Breath sounds: Normal breath sounds. No wheezing or rales.  Abdominal:     Palpations: Abdomen is soft.     Tenderness: There is no abdominal tenderness. There is right CVA tenderness (mild). There is no left CVA tenderness.  Skin:    General: Skin is warm and dry.  Neurological:     Mental Status: She is alert and oriented to person, place, and time.  Psychiatric:        Judgment: Judgment normal.     BP (!) 177/80 (BP Location: Right Arm, Patient Position: Sitting, Cuff Size: Large)   Pulse 82   Temp 98.4 F (36.9 C) (Oral)   Resp 16   Wt 283 lb (128.4 kg)   LMP 09/13/2011   SpO2 100%   BMI 47.02 kg/m  Wt Readings from Last 3 Encounters:  03/11/22 283 lb (128.4 kg)  02/19/22 283 lb (128.4 kg)  02/19/22 283 lb (128.4 kg)       Assessment & Plan:   Problem List Items Addressed This Visit       Unprioritized   TOBACCO ABUSE    Continues to smoke.  Trial of wellbutin.       Obstructive sleep apnea    Uncontrolled. Has  cpap titration study pending per cardiology.       Hypertension    BP is elevated- likely related to pain. Repeat in 1 week.       Hyperlipidemia    On repatha per lipid clinic.        Flank pain - Primary    New.  Present x 1.5 weeks. Will obtain KUB to evaluate for kidney stone.        Relevant Orders   DG Abd 2 Views   POCT Urinalysis Dipstick (Automated) (Completed)   Urine Culture   Diabetes mellitus (HCC)    Lab Results  Component Value Date   HGBA1C 9.0  (A) 12/04/2021   HGBA1C 10.7 (H) 07/04/2021   HGBA1C 13.5 (A) 10/04/2020   Lab Results  Component Value Date   MICROALBUR 6.6 (H) 06/21/2016   LDLCALC 134 (H) 10/24/2017   CREATININE 1.00 07/14/2021  A1C is improving.  Management per cardiology.        Depression with anxiety    Would like referral to counseling. given pamphlet.       Relevant Medications   buPROPion (WELLBUTRIN SR) 150 MG 12 hr tablet   CHF (congestive heart failure) (HCC)    Wt Readings from Last 3 Encounters:  03/11/22 283 lb (128.4 kg)  02/19/22 283 lb (128.4 kg)  02/19/22 283 lb (128.4 kg)  Weight is stable.  Continue lasix 109m once daily.         Meds ordered this encounter  Medications   buPROPion (WELLBUTRIN SR) 150 MG 12 hr tablet    Sig: Take 1 tablet (150 mg total) by mouth 2 (two) times daily.    Dispense:  60 tablet    Refill:  2    Order Specific Question:   Supervising Provider    Answer:   BPenni HomansA [[id number]]    I, MNance Pear NP, personally preformed the services described in this documentation.  All medical record entries made by the scribe were at my direction and in my presence.  I have reviewed the chart and discharge instructions (if applicable) and agree that the record reflects my personal performance and is accurate and complete. 03/11/2022   I,Catherine Espinoza,acting as a sEducation administratorfor MNance Pear NP.,have documented all relevant documentation on the behalf of MNance Pear NP,as directed by  MNance Pear NP while in the presence of MNance Pear NP.   MNance Pear NP

## 2022-03-11 NOTE — Assessment & Plan Note (Signed)
Uncontrolled. Has cpap titration study pending per cardiology.

## 2022-03-11 NOTE — Assessment & Plan Note (Signed)
BP is elevated- likely related to pain. Repeat in 1 week.

## 2022-03-11 NOTE — Telephone Encounter (Signed)
Prior Authorization for CPAP titration sent to [facility] via Phone. Reference # [id number].

## 2022-03-11 NOTE — Assessment & Plan Note (Signed)
Would like referral to counseling. given pamphlet.

## 2022-03-11 NOTE — Assessment & Plan Note (Signed)
Lab Results  Component Value Date   HGBA1C 9.0 (A) 12/04/2021   HGBA1C 10.7 (H) 07/04/2021   HGBA1C 13.5 (A) 10/04/2020   Lab Results  Component Value Date   MICROALBUR 6.6 (H) 06/21/2016   LDLCALC 134 (H) 10/24/2017   CREATININE 1.00 07/14/2021   A1C is improving.  Management per cardiology.

## 2022-03-11 NOTE — Assessment & Plan Note (Signed)
Wt Readings from Last 3 Encounters:  03/11/22 283 lb (128.4 kg)  02/19/22 283 lb (128.4 kg)  02/19/22 283 lb (128.4 kg)   Weight is stable.  Continue lasix '40mg'$  once daily.

## 2022-03-11 NOTE — Telephone Encounter (Signed)
Patient advised of results ans provider's comments. She was at the pharmacy picking up her new medication. She will be aware for call from urology.

## 2022-03-11 NOTE — Assessment & Plan Note (Signed)
New.  Present x 1.5 weeks. Will obtain KUB to evaluate for kidney stone.

## 2022-03-12 ENCOUNTER — Other Ambulatory Visit (HOSPITAL_BASED_OUTPATIENT_CLINIC_OR_DEPARTMENT_OTHER): Payer: Self-pay

## 2022-03-12 LAB — URINE CULTURE
MICRO NUMBER:: 13512856
SPECIMEN QUALITY:: ADEQUATE

## 2022-03-13 ENCOUNTER — Encounter: Payer: Self-pay | Admitting: Family

## 2022-03-19 ENCOUNTER — Ambulatory Visit: Payer: No Typology Code available for payment source | Admitting: Family

## 2022-03-19 ENCOUNTER — Telehealth: Payer: Self-pay | Admitting: Family

## 2022-03-19 NOTE — Telephone Encounter (Signed)
Pt came in for her appt at 2:34, which appt was at 2:20 appt with provider, pt was asked to r/s but pt stated does not need to reschedule due to feeling a lot better. Pt stated it was just a fu appt for her back.

## 2022-03-20 NOTE — Telephone Encounter (Signed)
CPAP titration PA received. Auth # [id number]. Valid dates 03/30/22 to 06/30/22.

## 2022-03-21 ENCOUNTER — Ambulatory Visit: Payer: No Typology Code available for payment source

## 2022-03-21 DIAGNOSIS — I5022 Chronic systolic (congestive) heart failure: Secondary | ICD-10-CM

## 2022-04-08 ENCOUNTER — Ambulatory Visit (INDEPENDENT_AMBULATORY_CARE_PROVIDER_SITE_OTHER): Payer: No Typology Code available for payment source | Admitting: Internal Medicine

## 2022-04-08 ENCOUNTER — Other Ambulatory Visit (HOSPITAL_BASED_OUTPATIENT_CLINIC_OR_DEPARTMENT_OTHER): Payer: Self-pay

## 2022-04-08 ENCOUNTER — Encounter: Payer: Self-pay | Admitting: Internal Medicine

## 2022-04-08 VITALS — BP 136/84 | HR 91 | Ht 65.05 in | Wt 281.0 lb

## 2022-04-08 DIAGNOSIS — Z794 Long term (current) use of insulin: Secondary | ICD-10-CM

## 2022-04-08 DIAGNOSIS — R635 Abnormal weight gain: Secondary | ICD-10-CM | POA: Diagnosis not present

## 2022-04-08 DIAGNOSIS — E1165 Type 2 diabetes mellitus with hyperglycemia: Secondary | ICD-10-CM

## 2022-04-08 DIAGNOSIS — E1159 Type 2 diabetes mellitus with other circulatory complications: Secondary | ICD-10-CM

## 2022-04-08 LAB — POCT GLYCOSYLATED HEMOGLOBIN (HGB A1C): Hemoglobin A1C: 9.1 % — AB (ref 4.0–5.6)

## 2022-04-08 MED ORDER — RYBELSUS 14 MG PO TABS
14.0000 mg | ORAL_TABLET | Freq: Every day | ORAL | 2 refills | Status: DC
  Filled 2022-04-08: qty 90, 90d supply, fill #0

## 2022-04-08 MED ORDER — TOUJEO MAX SOLOSTAR 300 UNIT/ML ~~LOC~~ SOPN
50.0000 [IU] | PEN_INJECTOR | Freq: Every day | SUBCUTANEOUS | 3 refills | Status: DC
  Filled 2022-04-08 – 2022-04-30 (×4): qty 15, 90d supply, fill #0

## 2022-04-08 NOTE — Patient Instructions (Addendum)
-   Increase Toujeo 50 units daily  - Continue Jardiance 25 mg, 1 tablet every morning  - Increase Rybelsus to 14 mg daily     HOW TO TREAT LOW BLOOD SUGARS (Blood sugar LESS THAN 70 MG/DL) Please follow the RULE OF 15 for the treatment of hypoglycemia treatment (when your (blood sugars are less than 70 mg/dL)   STEP 1: Take 15 grams of carbohydrates when your blood sugar is low, which includes:  3-4 GLUCOSE TABS  OR 3-4 OZ OF JUICE OR REGULAR SODA OR ONE TUBE OF GLUCOSE GEL    STEP 2: RECHECK blood sugar in 15 MINUTES STEP 3: If your blood sugar is still low at the 15 minute recheck --> then, go back to STEP 1 and treat AGAIN with another 15 grams of carbohydrates.   24-Hour Urine Collection  You will be collecting your urine for a 24-hour period of time. Your timer starts with your first urine of the morning (For example - If you first pee at [place], your timer will start at [place]) [place] away your first urine of the morning Collect your urine every time you pee for the next 24 hours STOP your urine collection 24 hours after you started the collection (For example - You would stop at 9AM the day after you started)

## 2022-04-08 NOTE — Progress Notes (Signed)
Name: Catherine Espinoza  MRN/ DOB: [id number], 1976-03-19   Age/ Sex: 46 y.o., female    PCP: [name], NP   Reason for Endocrinology Evaluation: Type 2 Diabetes Mellitus     Date of Initial Endocrinology Visit: 12/04/2021    PATIENT IDENTIFIER: Catherine Espinoza is a 46 y.o. female with a past medical history of T2DM, HTN, CAD and CHF. The patient presented for initial endocrinology clinic visit on 12/04/2021 for consultative assistance with her diabetes management.    HPI: Catherine Espinoza was    Diagnosed with DM 2011 Prior Medications tried/Intolerance: Metformin -GI intolerance Currently checking blood sugars multiple x / day, through CGM Hypoglycemia episodes : Yes               symptoms: Yes                 Frequency: 2/week Hemoglobin A1c has ranged from 9.4% in 2012, peaking at 14.9% in 2019.  Patient has required hospitalization within the last 1 year from hyper or hypoglycemia: No  In terms of diet, the patient does eat Doritos, chips, peanut butter and crackers instead of breakfast.  She does admit to drinking regular [facility]  She is a tech   On her initial visit , her A1c 9.0% . Continued Toujeo , increased Jardiance and started Rybelsus   SUBJECTIVE:   During the last visit (11/2021): A1c 9.0 %   Today (04/08/22): Catherine Espinoza is here for follow-up on diabetes management. She is accompanied by her mother. She checks her blood sugars multiple  times daily.  She has not had hypoglycemic episodes since the last clinic visit  She has chronic diarrhea that she attributes to hx of cholecystectomy  Pending urology eval for microscopic hematuria    HOME DIABETES REGIMEN: Jardiance 25 mg daily  Ryebsus 7 mg daily  Toujeo 40 units daily    Statin: yes ACE-I/ARB: yes Prior Diabetic Education: Yes    CONTINUOUS GLUCOSE MONITORING RECORD INTERPRETATION    Dates of Recording: 6/27-7/06/2022  Sensor description:freestyle libre  Results statistics:    CGM use % of time 6  Average and SD 208/-  Time in range  0  %  % Time Above 180 97  % Time above 250 3  % Time Below target 0    Glycemic patterns summary: Limited data, Bg's high all day and night   Hyperglycemic episodes  all day and night   Hypoglycemic episodes occurred N/A  Overnight periods: remains elevated      DIABETIC COMPLICATIONS: Microvascular complications:   Denies: CKD, neuropathy, retinopathy Last eye exam: Completed years ago  Macrovascular complications:  CAD/CHF Denies:  PVD, CVA   PAST HISTORY: Past Medical History:  Past Medical History:  Diagnosis Date   Abscess of right hand 07/13/2021   Anemia, unspecified    CAD (coronary artery disease)    Chronic left-sided thoracic back pain    Excessive or frequent menstruation    Morbid obesity ([place])    Obstructive sleep apnea 02/11/2011   Sleep study: severe OSA- rec CPAP 20cm small  full face mask   Proteinuria    Thoracic radiculopathy    Type II or unspecified type diabetes mellitus without mention of complication, not stated as uncontrolled    Unspecified essential hypertension    Past Surgical History:  Past Surgical History:  Procedure Laterality Date   ABDOMINAL HYSTERECTOMY     ABLATION COLPOCLESIS     CESAREAN SECTION     CLEFT  PALATE REPAIR     CORONARY STENT INTERVENTION N/A 10/13/2020   Procedure: CORONARY STENT INTERVENTION;  Surgeon: Burnell Blanks, MD;  Location: [place] [facility];  Service: Cardiovascular;  Laterality: N/A;   cyst removal     ECTOPIC PREGNANCY SURGERY     x 2   I & D EXTREMITY Right 07/13/2021   Procedure: IRRIGATION AND DEBRIDEMENT OF HAND;  Surgeon: Sherilyn Cooter, MD;  Location: [place];  Service: Orthopedics;  Laterality: Right;   OOPHORECTOMY Right 2002   RIGHT/LEFT HEART CATH AND CORONARY ANGIOGRAPHY N/A 10/13/2020   Procedure: RIGHT/LEFT HEART CATH AND CORONARY ANGIOGRAPHY;  Surgeon: Jolaine Artist, MD;  Location: [place] [facility];   Service: Cardiovascular;  Laterality: N/A;    Social History:  reports that she has been smoking cigarettes. She has a 5.00 pack-year smoking history. She has never used smokeless tobacco. She reports current alcohol use. She reports that she does not use drugs. Family History:  Family History  Problem Relation Age of Onset   Heart attack Maternal Aunt    Diabetes Mother    Cancer Father        oral cancer     HOME MEDICATIONS: Allergies as of 04/08/2022       Reactions   Ibuprofen    REACTION: knots in mouth with SOB   Labetalol Nausea Only   Aspirin Other (See Comments)   Knots in mouth/ Higher doses        Medication List        Accurate as of April 08, 2022  1:53 PM. If you have any questions, ask your nurse or doctor.          STOP taking these medications    Bayer Microlet Lancets lancets Stopped by: Dorita Sciara, MD       TAKE these medications    acetaminophen 500 MG tablet Commonly known as: TYLENOL Take 1,000 mg by mouth every 6 (six) hours as needed for mild pain or moderate pain.   blood glucose meter kit and supplies Kit Dispense based on patient and insurance preference. Use up to four times daily as directed. (FOR ICD-9 250.00, 250.01).   buPROPion 150 MG 12 hr tablet Commonly known as: Wellbutrin SR Take 1 tablet (150 mg total) by mouth 2 (two) times daily.   carvedilol 12.5 MG tablet Commonly known as: COREG Take 2 tablets (25 mg total) by mouth 2 (two) times daily with a meal.   Entresto 97-103 MG Generic drug: sacubitril-valsartan Take 1 tablet by mouth 2 (two) times daily. NEEDS APPOINTMENT FOR ANY MORE REFILLS   FreeStyle Libre 2 Sensor Misc USE AS DIRECTED   furosemide 40 MG tablet Commonly known as: LASIX Take 1 tablet (40 mg total) by mouth daily.   Insulin Syringe 27G X 1/2" 0.5 ML Misc Use as directed   Jardiance 25 MG Tabs tablet Generic drug: empagliflozin Take 1 tablet (25 mg total) by mouth daily before  breakfast.   Repatha SureClick 700 MG/ML Soaj Generic drug: Evolocumab Inject 1 Dose into the skin every 14 (fourteen) days.   rosuvastatin 40 MG tablet Commonly known as: CRESTOR TAKE 1 TABLET (40 MG TOTAL) BY MOUTH DAILY.   Rybelsus 7 MG Tabs Generic drug: Semaglutide Take 1 tablet by mouth daily.   spironolactone 25 MG tablet Commonly known as: ALDACTONE TAKE 1 TABLET (25 MG TOTAL) BY MOUTH DAILY.   TechLite Pen Needles 32G X 6 MM Misc Generic drug: Insulin Pen Needle Use  as directed once daily in the afternoon   Toujeo Max SoloStar 300 UNIT/ML Solostar Pen Generic drug: insulin glargine (2 Unit Dial) Inject 40 Units into the skin daily in the afternoon.         ALLERGIES: Allergies  Allergen Reactions   Ibuprofen     REACTION: knots in mouth with SOB   Labetalol Nausea Only   Aspirin Other (See Comments)    Knots in mouth/ Higher doses         OBJECTIVE:   VITAL SIGNS: BP 136/84 (BP Location: Left Arm, Patient Position: Sitting, Cuff Size: Large)   Pulse 91   Ht 5' 5.05" (1.652 m)   Wt 281 lb (127.5 kg)   LMP 09/13/2011   SpO2 94%   BMI 46.69 kg/m    PHYSICAL EXAM:  General: Pt appears well and is in NAD  Lungs: Clear with good BS bilat with no rales, rhonchi, or wheezes  Heart: RRR with normal S1 and S2 and no gallops; no murmurs; no rub  Abdomen: Normoactive bowel sounds, soft, nontender, without masses or organomegaly palpable  Extremities:  Lower extremities - No pretibial edema. No lesions.  Neuro: MS is good with appropriate affect, pt is alert and Ox3    DM foot exam: 12/04/2021  The skin of the feet is intact without sores or ulcerations. The pedal pulses are 2+ on right and 2+ on left. The sensation is intact to a screening 5.07, 10 gram monofilament bilaterally    DATA REVIEWED:  Lab Results  Component Value Date   HGBA1C 9.0 (A) 12/04/2021   HGBA1C 10.7 (H) 07/04/2021   HGBA1C 13.5 (A) 10/04/2020   Lab Results  Component  Value Date   MICROALBUR 6.6 (H) 06/21/2016   LDLCALC 134 (H) 10/24/2017   CREATININE 1.00 07/14/2021   Lab Results  Component Value Date   MICRALBCREAT 7.2 06/21/2016    Lab Results  Component Value Date   CHOL 250 (H) 07/04/2021   HDL 42.80 07/04/2021   LDLCALC 134 (H) 10/24/2017   LDLDIRECT 171.0 07/04/2021   TRIG 239.0 (H) 07/04/2021   CHOLHDL 6 07/04/2021        Latest Reference Range & Units 07/14/21 02:07  Sodium 135 - 145 mmol/L 129 (L)  Potassium 3.5 - 5.1 mmol/L 3.9  Chloride 98 - 111 mmol/L 99  CO2 22 - 32 mmol/L 22  Glucose 70 - 99 mg/dL 409 (H)  BUN 6 - 20 mg/dL 26 (H)  Creatinine 0.44 - 1.00 mg/dL 1.00  Calcium 8.9 - 10.3 mg/dL 8.7 (L)  Anion gap 5 - 15  8  Alkaline Phosphatase 38 - 126 U/L 81  Albumin 3.5 - 5.0 g/dL 2.6 (L)  AST 15 - 41 U/L 14 (L)  ALT 0 - 44 U/L 21  Total Protein 6.5 - 8.1 g/dL 6.4 (L)  Total Bilirubin 0.3 - 1.2 mg/dL 0.5  GFR, Estimated >60 mL/min >60    Latest Reference Range & Units 07/04/21 12:01  Total CHOL/HDL Ratio  6  Cholesterol 0 - 200 mg/dL 250 (H)  HDL Cholesterol >39.00 mg/dL 42.80  Direct LDL mg/dL 171.0  NonHDL  207.11  Triglycerides 0.0 - 149.0 mg/dL 239.0 (H)  VLDL 0.0 - 40.0 mg/dL 47.8 (H)     ASSESSMENT / PLAN / RECOMMENDATIONS:   1) Type 2 Diabetes Mellitus, poorly controlled, With macrovascular complications - Most recent A1c of 9.1%. Goal A1c < 7.0 %.      - Despite increasing Jardiance, and started her  on Rybelsus the patient's glucose control remains to be elevated with no noticeable weight loss but weight gain instead  -Patient has been encouraged to scan freestyle libre every 8 hours because it shows on the download that there is only 6% use -I am going to increase her insulin as well as Rybelsus -I have advised the patient to avoid sugar sweetened beverages as unfortunately she continues to drink juice and sweet tea    MEDICATIONS: Increase Toujeo 50 units daily Continue Jardiance 25 mg  daily Increase Rybelsus 14 mg daily  EDUCATION / INSTRUCTIONS: BG monitoring instructions: Patient is instructed to check her blood sugars 3 times a day, before meals. Call [facility] if: BG persistently < 70 I reviewed the Rule of 15 for the treatment of hypoglycemia in detail with the patient. Literature supplied.   2) Diabetic complications:  Eye: Does not have known diabetic retinopathy.  Neuro/ Feet: Does not have known diabetic peripheral neuropathy. Renal: Patient does not have known baseline CKD. She is  on an ACEI/ARB at present.  3) Dyslipidemia/CAD :   - Per cardiology   4) Weight gain :   -I am going to screen her for Cushing syndrome with 24-hour urine cortisol collection   Follow-up in 4 months  Signed electronically by: Mack Guise, MD  [facility]  [facility] [address] [place], [place] [address] Phone: [phone] FAX: [phone]   CC: [name], NP [address] [place] [address] Phone: [phone]  Fax: [phone]    Return to Endocrinology clinic as below: Future Appointments  Date Time Provider [place]  04/08/2022  2:00 PM Nakita Santerre, Melanie Crazier, MD [facility] None  05/28/2022 11:00 AM Pixie Casino, MD [facility]  06/20/2022 11:30 AM [facility] PA/NP [facility] None

## 2022-04-09 ENCOUNTER — Encounter: Payer: Self-pay | Admitting: Internal Medicine

## 2022-04-10 ENCOUNTER — Encounter: Payer: Self-pay | Admitting: Internal Medicine

## 2022-04-11 ENCOUNTER — Other Ambulatory Visit (HOSPITAL_BASED_OUTPATIENT_CLINIC_OR_DEPARTMENT_OTHER): Payer: Self-pay

## 2022-04-11 MED ORDER — SEMAGLUTIDE (1 MG/DOSE) 4 MG/3ML ~~LOC~~ SOPN
1.0000 mg | PEN_INJECTOR | SUBCUTANEOUS | 3 refills | Status: DC
  Filled 2022-04-11: qty 9, 84d supply, fill #0
  Filled 2022-06-12 – 2022-06-19 (×2): qty 9, 84d supply, fill #1

## 2022-04-30 ENCOUNTER — Other Ambulatory Visit (HOSPITAL_BASED_OUTPATIENT_CLINIC_OR_DEPARTMENT_OTHER): Payer: Self-pay

## 2022-05-01 ENCOUNTER — Other Ambulatory Visit (HOSPITAL_BASED_OUTPATIENT_CLINIC_OR_DEPARTMENT_OTHER): Payer: Self-pay

## 2022-05-19 ENCOUNTER — Ambulatory Visit (HOSPITAL_BASED_OUTPATIENT_CLINIC_OR_DEPARTMENT_OTHER): Payer: No Typology Code available for payment source | Attending: Cardiology | Admitting: Cardiology

## 2022-05-25 LAB — NMR, LIPOPROFILE
Cholesterol, Total: 136 mg/dL (ref 100–199)
HDL Particle Number: 24.2 umol/L — ABNORMAL LOW (ref >=30.5)
HDL-C: 42 mg/dL (ref >39)
LDL Particle Number: 1171 nmol/L — ABNORMAL HIGH (ref <1000)
LDL Size: 20.7 nm (ref >20.5)
LDL-C (NIH Calc): 73 mg/dL (ref 0–99)
LP-IR Score: 55 — ABNORMAL HIGH (ref <=45)
Small LDL Particle Number: 783 nmol/L — ABNORMAL HIGH (ref <=527)
Triglycerides: 115 mg/dL (ref 0–149)

## 2022-05-25 LAB — LIPOPROTEIN A (LPA): Lipoprotein (a): 402.9 nmol/L — ABNORMAL HIGH (ref <75.0)

## 2022-05-27 NOTE — Progress Notes (Signed)
She qualifies since she had a stent to LAD and a DM. I will reach out to her

## 2022-05-28 ENCOUNTER — Encounter (HOSPITAL_BASED_OUTPATIENT_CLINIC_OR_DEPARTMENT_OTHER): Payer: Self-pay | Admitting: Internal Medicine

## 2022-05-28 ENCOUNTER — Ambulatory Visit (INDEPENDENT_AMBULATORY_CARE_PROVIDER_SITE_OTHER): Payer: No Typology Code available for payment source | Admitting: Internal Medicine

## 2022-05-28 VITALS — BP 159/97 | HR 92 | Ht 60.05 in | Wt 268.0 lb

## 2022-05-28 DIAGNOSIS — E7841 Elevated Lipoprotein(a): Secondary | ICD-10-CM

## 2022-05-28 DIAGNOSIS — I5022 Chronic systolic (congestive) heart failure: Secondary | ICD-10-CM

## 2022-05-28 DIAGNOSIS — I2510 Atherosclerotic heart disease of native coronary artery without angina pectoris: Secondary | ICD-10-CM | POA: Diagnosis not present

## 2022-05-28 DIAGNOSIS — E7849 Other hyperlipidemia: Secondary | ICD-10-CM | POA: Diagnosis not present

## 2022-05-28 NOTE — Progress Notes (Signed)
Ok sounds good

## 2022-05-28 NOTE — Progress Notes (Signed)
LIPID CLINIC CONSULT NOTE  Chief Complaint:  Manage dyslipidemia  Primary Care Physician: [name], NP  Primary Cardiologist:  None  HPI:  Catherine Espinoza is a 46 y.o. female who is being seen today for the evaluation of dyslipidemia at the request of Pierre Bali, MD.  This a pleasant 46 year old female with an unfortunate history of heart disease.  She has a history of coronary artery disease and presented with heart failure symptoms.  Ultimately found to have two-vessel coronary artery disease with high-grade lesions in the mid LAD and distal RCA.  LVEF was less than 20%.  She underwent PCI to the LAD in January 2022 with subsequent improvement in LVEF to 40 to 45% in June 2022.  She has longstanding history of dyslipidemia, type 2 diabetes which initially was poorly controlled with A1c 13 to 14%, now below 10%.  Also she has had significant weight loss more than 30 to 40 pounds.  Her most recent lipid profile showed total cholesterol 250, triglycerides 239, HDL 42 and direct LDL 171.  This is on therapy with 40 mg rosuvastatin.  Therefore her untreated LDL cholesterol much greater than 190 suggestive of a familial hyperlipidemia.  Her family history is not clear for heart disease although she has a number of close relatives who have had early onset heart disease or sudden death.  She has worked to improve her diet significantly including reducing saturated fats and carbohydrates.  05/28/2022    PMHx:  Past Medical History:  Diagnosis Date   Abscess of right hand 07/13/2021   Anemia, unspecified    CAD (coronary artery disease)    Chronic left-sided thoracic back pain    Excessive or frequent menstruation    Morbid obesity ([place])    Obstructive sleep apnea 02/11/2011   Sleep study: severe OSA- rec CPAP 20cm small  full face mask   Proteinuria    Thoracic radiculopathy    Type II or unspecified type diabetes mellitus without mention of complication, not stated as  uncontrolled    Unspecified essential hypertension     Past Surgical History:  Procedure Laterality Date   ABDOMINAL HYSTERECTOMY     ABLATION COLPOCLESIS     CESAREAN SECTION     CLEFT PALATE REPAIR     CORONARY STENT INTERVENTION N/A 10/13/2020   Procedure: CORONARY STENT INTERVENTION;  Surgeon: Burnell Blanks, MD;  Location: [place] [facility];  Service: Cardiovascular;  Laterality: N/A;   cyst removal     ECTOPIC PREGNANCY SURGERY     x 2   I & D EXTREMITY Right 07/13/2021   Procedure: IRRIGATION AND DEBRIDEMENT OF HAND;  Surgeon: Sherilyn Cooter, MD;  Location: [place];  Service: Orthopedics;  Laterality: Right;   OOPHORECTOMY Right 2002   RIGHT/LEFT HEART CATH AND CORONARY ANGIOGRAPHY N/A 10/13/2020   Procedure: RIGHT/LEFT HEART CATH AND CORONARY ANGIOGRAPHY;  Surgeon: Jolaine Artist, MD;  Location: [place] [facility];  Service: Cardiovascular;  Laterality: N/A;    FAMHx:  Family History  Problem Relation Age of Onset   Heart attack Maternal Aunt    Diabetes Mother    Cancer Father        oral cancer    SOCHx:   reports that she has been smoking cigarettes. She has a 5.00 pack-year smoking history. She has never used smokeless tobacco. She reports current alcohol use. She reports that she does not use drugs.  ALLERGIES:  Allergies  Allergen Reactions   Ibuprofen  REACTION: knots in mouth with SOB   Labetalol Nausea Only   Aspirin Other (See Comments)    Knots in mouth/ Higher doses     ROS: Pertinent items noted in HPI and remainder of comprehensive ROS otherwise negative.  HOME MEDS: Current Outpatient Medications on File Prior to Visit  Medication Sig Dispense Refill   acetaminophen (TYLENOL) 500 MG tablet Take 1,000 mg by mouth every 6 (six) hours as needed for mild pain or moderate pain.     blood glucose meter kit and supplies KIT Dispense based on patient and insurance preference. Use up to four times daily as directed. (FOR ICD-9 250.00,  250.01). 1 each 0   buPROPion (WELLBUTRIN SR) 150 MG 12 hr tablet Take 1 tablet (150 mg total) by mouth 2 (two) times daily. (Patient not taking: Reported on 04/08/2022) 60 tablet 2   carvedilol (COREG) 12.5 MG tablet Take 2 tablets (25 mg total) by mouth 2 (two) times daily with a meal. 60 tablet 11   Continuous Blood Gluc Sensor (FREESTYLE LIBRE 2 SENSOR) MISC USE AS DIRECTED 6 each 3   empagliflozin (JARDIANCE) 25 MG TABS tablet Take 1 tablet (25 mg total) by mouth daily before breakfast. 90 tablet 3   Evolocumab (REPATHA SURECLICK) 161 MG/ML SOAJ Inject 1 Dose into the skin every 14 (fourteen) days. 2 mL 11   furosemide (LASIX) 40 MG tablet Take 1 tablet (40 mg total) by mouth daily. 90 tablet 3   insulin glargine, 2 Unit Dial, (TOUJEO MAX SOLOSTAR) 300 UNIT/ML Solostar Pen Inject 50 Units into the skin daily in the afternoon. 15 mL 3   Insulin Pen Needle 32G X 6 MM MISC Use as directed once daily in the afternoon 100 each 3   Insulin Syringe 27G X 1/2" 0.5 ML MISC Use as directed 100 each 3   rosuvastatin (CRESTOR) 40 MG tablet TAKE 1 TABLET (40 MG TOTAL) BY MOUTH DAILY. 90 tablet 3   sacubitril-valsartan (ENTRESTO) 97-103 MG Take 1 tablet by mouth 2 (two) times daily. NEEDS APPOINTMENT FOR ANY MORE REFILLS 90 tablet 0   Semaglutide, 1 MG/DOSE, 4 MG/3ML SOPN Inject 1 mg as directed once a week. 9 mL 3   spironolactone (ALDACTONE) 25 MG tablet TAKE 1 TABLET (25 MG TOTAL) BY MOUTH DAILY. 90 tablet 3   No current facility-administered medications on file prior to visit.    LABS/IMAGING: No results found for this or any previous visit (from the past 48 hour(s)). No results found.  LIPID PANEL:    Component Value Date/Time   CHOL 250 (H) 07/04/2021 1201   TRIG 239.0 (H) 07/04/2021 1201   HDL 42.80 07/04/2021 1201   CHOLHDL 6 07/04/2021 1201   VLDL 47.8 (H) 07/04/2021 1201   LDLCALC 134 (H) 10/24/2017 1406   LDLDIRECT 171.0 07/04/2021 1201    WEIGHTS: Wt Readings from Last 3  Encounters:  04/08/22 281 lb (127.5 kg)  03/11/22 283 lb (128.4 kg)  02/19/22 283 lb (128.4 kg)    VITALS: LMP 09/13/2011   EXAM: Deferred  EKG: N/A  ASSESSMENT: Probable familial hyperlipidemia, untreated LDL cholesterol greater than 190 Coronary artery disease status post PCI to the LAD (09/2020) 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   Catherine Espinoza has a probable familial hyperlipidemia.  She is on 40 mg of rosuvastatin which revealed a  50% LDL reduction however her LDL remains high at 171.  This suggests more likely she has a familial hyperlipidemia and possibly a high LP(a).  The fact that she has had very early onset heart disease is also highly suggestive of this.  She has numerous other risk factors and would be considered high risk with a target LDL less than 55.  This may be difficult to achieve.  She will certainly need additional therapy and is a good candidate to add PCSK9 inhibitor to her rosuvastatin.  We will reach out for prior authorization for this.  Plan a repeat lipid NMR and LP(a) in about 3 months.  She may need additional therapy such as Nexletol or Nexlizet to reach target.  We did discuss the genetic nature of this and the fact that she should have her daughter who was in her 43s screened for cholesterol disorder.  In addition I offered genetic testing however she declined.  She is somewhat overwhelmed by the disease.  I asked her to reach out to heart failure clinic to see if perhaps she could benefit from some counseling as she feels like her heart is a "time bomb".  Thanks again for referring this very pleasant patient.  Catherine Casino, MD, [facility], [place] Director of the Advanced Lipid Disorders &  Cardiovascular Risk Reduction Clinic Diplomate of the American Board  of Clinical Lipidology Attending Cardiologist  Direct Dial: [phone]  Fax: [phone]  Website:  [email or web address] Ahlaya Ende 05/28/2022, 11:13 AM

## 2022-05-28 NOTE — Patient Instructions (Signed)
Medication Instructions:  NO CHANGES  *If you need a refill on your cardiac medications before your next appointment, please call your pharmacy*    Follow-Up: At [facility], you and your health needs are our priority.  As part of our continuing mission to provide you with exceptional heart care, we have created designated Provider Care Teams.  These Care Teams include your primary Cardiologist (physician) and Advanced Practice Providers (APPs -  Physician Assistants and Nurse Practitioners) who all work together to provide you with the care you need, when you need it.  We recommend signing up for the patient portal called "MyChart".  Sign up information is provided on this After Visit Summary.  MyChart is used to connect with patients for Virtual Visits (Telemedicine).  Patients are able to view lab/test results, encounter notes, upcoming appointments, etc.  Non-urgent messages can be sent to your provider as well.   To learn more about what you can do with MyChart, go to [email or web address].    Your next appointment:    6 months with Dr. Hilty 

## 2022-05-29 ENCOUNTER — Other Ambulatory Visit (HOSPITAL_BASED_OUTPATIENT_CLINIC_OR_DEPARTMENT_OTHER): Payer: Self-pay

## 2022-05-29 ENCOUNTER — Encounter (HOSPITAL_BASED_OUTPATIENT_CLINIC_OR_DEPARTMENT_OTHER): Payer: Self-pay | Admitting: Internal Medicine

## 2022-06-12 ENCOUNTER — Other Ambulatory Visit (HOSPITAL_BASED_OUTPATIENT_CLINIC_OR_DEPARTMENT_OTHER): Payer: Self-pay

## 2022-06-19 NOTE — Progress Notes (Signed)
Advanced Heart Failure Clinic Note   PCP: [name], NP HF Cardiologist: Dr. Haroldine Laws  Reason for visit/CC: Follow up for HF  HPI: Catherine Espinoza is a 46 yo female with T2DM, HTN, morbid obesity BMI 40, CAD with previous LAD stent, OSA and systolic HF  Presented to ED on 10/11/20 for leg swelling. Echo showed EF 20-25%. R/LHC completed; patient had PCI to LAD, distal RCA disease treated medically, preserved CO, elevated filling pressures. She was diuresed and started on GDMT.   Echo 03/12/21 EF 40-45%   Last seen for f/u in May 2023. Volume stable. Coreg increased d/t HTN. Sleep study reordered but not completed by patient.  Severe OSA with AHI 47 in May 2023. She missed appointment for CPAP titration in August d/t illness.  Echo 03/06/22: EF 50-55%, grade I DD, RV systolic function not well visualized, RVSP 32 mmHg  She is here today for f/u. Has lost 20 lbs since starting ozempic a few months ago. She stopped insulin d/t hypoglycemia. 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 No CP, shortness of breath, orthopnea, PND or LE edema. Reports adherence with all medications.  Denies ETOH use. Smokes about 1/2 ppd. Not ready to quit.   Cardiac Studies  Echo 1/22: EF 20-25% with global hypokinesis, mild LVH, grade I DD, RVSP 54, small pericardial effusion and IVC dilated w/ RAP 15.  R/LHC 1/22: RHC/LHC: PCI to LAD Prox RCA lesion is 30% stenosed. RPDA lesion is 95% stenosed. RPAV lesion is 99% stenosed. Prox LAD to Mid LAD lesion is 40% stenosed. Mid LAD lesion is 80% stenosed. 2nd Diag lesion is 50% stenosed.   Findings:  Ao = 147/97 (119) LV =  140/27 RA =  12 RV = 68/15 PA = 72/37 (51) PCW = 34 Fick cardiac output/index = 6.3/2.9 PVR = 2.6 WU FA sat = 97% PA sat = 72%, 72%  Assessment: 1. 2v CAD with high grade lesions in the mid LAD and distal RCA 2. 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 Markedly elevated  filling pressures with normal cardiac output  Plan/Discussion: Plan PCI of LAD followed by aggressive diuresis and titration of GDMT  ROS: All systems reviewed and negative except as per HPI.   Past Medical History:  Diagnosis Date   Abscess of right hand 07/13/2021   Anemia, unspecified    CAD (coronary artery disease)    Chronic left-sided thoracic back pain    Excessive or frequent menstruation    Morbid obesity ([place])    Obstructive sleep apnea 02/11/2011   Sleep study: severe OSA- rec CPAP 20cm small  full face mask   Proteinuria    Thoracic radiculopathy    Type II or unspecified type diabetes mellitus without mention of complication, not stated as uncontrolled    Unspecified essential hypertension     Current Outpatient Medications  Medication Sig Dispense Refill   acetaminophen (TYLENOL) 500 MG tablet Take 1,000 mg by mouth every 6 (six) hours as needed for mild pain or moderate pain.     blood glucose meter kit and supplies KIT Dispense based on patient and insurance preference. Use up to four times daily as directed. (FOR ICD-9 250.00, 250.01). 1 each 0   carvedilol (COREG) 12.5 MG tablet Take 2 tablets (25 mg total) by mouth 2 (two) times daily with a meal. 60 tablet 11   Continuous Blood Gluc Sensor (FREESTYLE LIBRE 2 SENSOR) MISC USE AS DIRECTED 6 each  3   empagliflozin (JARDIANCE) 25 MG TABS tablet Take 1 tablet (25 mg total) by mouth daily before breakfast. 90 tablet 3   Evolocumab (REPATHA SURECLICK) 992 MG/ML SOAJ Inject 1 Dose into the skin every 14 (fourteen) days. 2 mL 11   furosemide (LASIX) 40 MG tablet Take 1 tablet (40 mg total) by mouth daily. 90 tablet 3   Insulin Pen Needle 32G X 6 MM MISC Use as directed once daily in the afternoon 100 each 3   Insulin Syringe 27G X 1/2" 0.5 ML MISC Use as directed 100 each 3   isosorbide-hydrALAZINE (BIDIL) 20-37.5 MG tablet Take 1 tablet by mouth 3 (three) times daily. 90 tablet 11   rosuvastatin (CRESTOR) 40 MG tablet  TAKE 1 TABLET (40 MG TOTAL) BY MOUTH DAILY. 90 tablet 3   sacubitril-valsartan (ENTRESTO) 97-103 MG Take 1 tablet by mouth 2 (two) times daily. NEEDS APPOINTMENT FOR ANY MORE REFILLS 90 tablet 0   Semaglutide, 1 MG/DOSE, 4 MG/3ML SOPN Inject 1 mg as directed once a week. 9 mL 3   spironolactone (ALDACTONE) 25 MG tablet TAKE 1 TABLET (25 MG TOTAL) BY MOUTH DAILY. 90 tablet 3   buPROPion (WELLBUTRIN SR) 150 MG 12 hr tablet Take 1 tablet (150 mg total) by mouth 2 (two) times daily. (Patient not taking: Reported on 06/20/2022) 60 tablet 2   insulin glargine, 2 Unit Dial, (TOUJEO MAX SOLOSTAR) 300 UNIT/ML Solostar Pen Inject 50 Units into the skin daily in the afternoon. (Patient not taking: Reported on 06/20/2022) 15 mL 3   No current facility-administered medications for this encounter.    Allergies  Allergen Reactions   Ibuprofen     REACTION: knots in mouth with SOB   Labetalol Nausea Only   Aspirin Other (See Comments)    Knots in mouth/ Higher doses     Social History   Socioeconomic History   Marital status: Single    Spouse name: Not on file   Number of children: 1   Years of education: Not on file   Highest education level: Associate degree: occupational, Hotel manager, or vocational program  Occupational History   Occupation: cna/med Engineer, production: [employer]  Tobacco Use   Smoking status: Every Day    Packs/day: 0.50    Years: 10.00    Total pack years: 5.00    Types: Cigarettes   Smokeless tobacco: Never  Vaping Use   Vaping Use: Never used  Substance and Sexual Activity   Alcohol use: Yes    Alcohol/week: 0.0 standard drinks of alcohol    Comment: occasional use   Drug use: No   Sexual activity: Yes    Birth control/protection: Surgical  Other Topics Concern   Not on file  Social History Narrative   Married-filing for divorced   Daughter born 2003   Works as cna/med Designer, multimedia   Social Determinants of Health   Financial Resource Strain: High Risk  (10/12/2020)   Overall Financial Resource Strain (CARDIA)    Difficulty of Paying Living Expenses: Hard  Food Insecurity: No Food Insecurity (10/12/2020)   Hunger Vital Sign    Worried About Running Out of Food in the Last Year: Never true    [place] in the Last Year: Never true  Transportation Needs: No Transportation Needs (10/12/2020)   PRAPARE - Hydrologist (Medical): No    Lack of Transportation (Non-Medical): No  Physical Activity: Not on file  Stress: Not on  file  Social Connections: Not on file  Intimate Partner Violence: Not At Risk (10/12/2020)   Humiliation, Afraid, Rape, and Kick questionnaire    Fear of Current or Ex-Partner: No    Emotionally Abused: No    Physically Abused: No    Sexually Abused: No   Family History  Problem Relation Age of Onset   Heart attack Maternal Aunt    Diabetes Mother    Cancer Father        oral cancer   Vitals:   06/20/22 1122  BP: (!) 160/98  Pulse: 89  SpO2: 96%  Weight: 119.4 kg (263 lb 3.2 oz)     Wt Readings from Last 3 Encounters:  06/20/22 119.4 kg (263 lb 3.2 oz)  05/28/22 121.6 kg (268 lb)  04/08/22 127.5 kg (281 lb)   PHYSICAL EXAM: General:  Well appearing. No resp difficulty HEENT: normal Neck: supple. no JVD. Carotids 2+ bilat; no bruits. No lymphadenopathy or thryomegaly appreciated. Cor: PMI nondisplaced. Regular rate & rhythm. No rubs, gallops or murmurs. Lungs: clear Abdomen: obese soft, nontender, nondistended. No hepatosplenomegaly. No bruits or masses. Good bowel sounds. Extremities: no cyanosis, clubbing, rash, edema Neuro: alert & orientedx3, cranial nerves grossly intact. moves all 4 extremities w/o difficulty. Affect pleasant   ASSESSMENT & PLAN:  1.  Chronic Systolic Heart Failure - Echo 1/22 EF 20-25% with global hypokinesis, mild LVH, grade I DD, RVSP 54, small pericardial effusion and IVC dilated w/ RAP 15.  - Cath with LAD lesion s/p PCI - Echo 03/12/21 EF  40-45% - Echo 06/23: EF 50-55% - CM felt to be mixed iCM and HTN - NYHA II - Volume status looks good Continue lasix 40 mg daily + 20 mEq of KCl. - Continue carvedilol 25 bid - Continue spiro 25 mg daily. - Continue Farxiga 10 mg daily. No GU symptoms. - Continue Entresto 97/103 mg bid. - BP elevated. Add bidil 1 tab TID - Labs today  2. CAD - S/P PCI LAD 1/22 - On Aspirin + crestor + plavix.  - No s/s angina - Lipids being managed by Dr. Debara Pickett - Stressed need for smoking cessation, stress reduction and DM2 control  3. Uncontrolled T2DM - A1c 9.0% - On semaglutide and farxiga - recently stopped insulin d/t hypoglycemia - following with endocrine   4. Hypertension - BP elevated - OSA likely contributing - Meds as above   5. Obesity - Body mass index is 51 - Has lost 20 lb so far with ozempic  6. Tobacco use - Smoking 1/2 ppd - Encouraged her to quit. Not ready. She is no longer taking wellbutrin.  7. OSA - Severe. AHI 47 - recommended she reschedule CPAP titration  8. Hyperlipidenmia - followed by Dr. Debara Pickett  Follow-up: 6 months with APP  [facility], Ewelina Naves N, PA-C 06/20/22

## 2022-06-20 ENCOUNTER — Encounter (HOSPITAL_COMMUNITY): Payer: Self-pay

## 2022-06-20 ENCOUNTER — Other Ambulatory Visit (HOSPITAL_BASED_OUTPATIENT_CLINIC_OR_DEPARTMENT_OTHER): Payer: Self-pay

## 2022-06-20 ENCOUNTER — Other Ambulatory Visit (HOSPITAL_COMMUNITY): Payer: Self-pay

## 2022-06-20 ENCOUNTER — Ambulatory Visit (HOSPITAL_COMMUNITY)
Admission: RE | Admit: 2022-06-20 | Discharge: 2022-06-20 | Disposition: A | Discharge status: Home / Self Care | Payer: No Typology Code available for payment source | Source: Ambulatory Visit | Attending: Physician Assistant | Admitting: Physician Assistant

## 2022-06-20 VITALS — BP 160/98 | HR 89 | Wt 263.2 lb

## 2022-06-20 DIAGNOSIS — I10 Essential (primary) hypertension: Secondary | ICD-10-CM

## 2022-06-20 DIAGNOSIS — I3139 Other pericardial effusion (noninflammatory): Secondary | ICD-10-CM | POA: Diagnosis not present

## 2022-06-20 DIAGNOSIS — E11649 Type 2 diabetes mellitus with hypoglycemia without coma: Secondary | ICD-10-CM | POA: Insufficient documentation

## 2022-06-20 DIAGNOSIS — F17200 Nicotine dependence, unspecified, uncomplicated: Secondary | ICD-10-CM

## 2022-06-20 DIAGNOSIS — I110 Hypertensive heart disease with heart failure: Secondary | ICD-10-CM | POA: Insufficient documentation

## 2022-06-20 DIAGNOSIS — F17210 Nicotine dependence, cigarettes, uncomplicated: Secondary | ICD-10-CM | POA: Insufficient documentation

## 2022-06-20 DIAGNOSIS — Z7984 Long term (current) use of oral hypoglycemic drugs: Secondary | ICD-10-CM | POA: Diagnosis not present

## 2022-06-20 DIAGNOSIS — I5022 Chronic systolic (congestive) heart failure: Secondary | ICD-10-CM

## 2022-06-20 DIAGNOSIS — I2510 Atherosclerotic heart disease of native coronary artery without angina pectoris: Secondary | ICD-10-CM | POA: Diagnosis not present

## 2022-06-20 DIAGNOSIS — Z6841 Body Mass Index (BMI) 40.0 and over, adult: Secondary | ICD-10-CM | POA: Insufficient documentation

## 2022-06-20 DIAGNOSIS — Z006 Encounter for examination for normal comparison and control in clinical research program: Secondary | ICD-10-CM

## 2022-06-20 DIAGNOSIS — Z79899 Other long term (current) drug therapy: Secondary | ICD-10-CM | POA: Diagnosis not present

## 2022-06-20 DIAGNOSIS — IMO0001 Reserved for inherently not codable concepts without codable children: Secondary | ICD-10-CM

## 2022-06-20 DIAGNOSIS — G4733 Obstructive sleep apnea (adult) (pediatric): Secondary | ICD-10-CM | POA: Diagnosis not present

## 2022-06-20 LAB — BASIC METABOLIC PANEL
Anion gap: 6 (ref 5–15)
BUN: 8 mg/dL (ref 6–20)
CO2: 25 mmol/L (ref 22–32)
Calcium: 8.9 mg/dL (ref 8.9–10.3)
Chloride: 107 mmol/L (ref 98–111)
Creatinine, Ser: 0.73 mg/dL (ref 0.44–1.00)
GFR, Estimated: >60 mL/min (ref >60)
Glucose, Bld: 95 mg/dL (ref 70–99)
Potassium: 3.5 mmol/L (ref 3.5–5.1)
Sodium: 138 mmol/L (ref 135–145)

## 2022-06-20 MED ORDER — ISOSORB DINITRATE-HYDRALAZINE 20-37.5 MG PO TABS
1.0000 | ORAL_TABLET | Freq: Three times a day (TID) | ORAL | 11 refills | Status: DC
  Filled 2022-06-20: qty 90, 30d supply, fill #0

## 2022-06-20 NOTE — Patient Instructions (Signed)
START Bidil one tab three times daily  Labs today We will only contact you if something comes back abnormal or we need to make some changes. Otherwise no news is good news!  Your physician wants you to follow-up in: 6 months (March 2024) You will receive a reminder letter in the mail two months in advance. If you don't receive a letter, please call our office to schedule the follow-up appointment January 2024.   Do the following things EVERYDAY: Weigh yourself in the morning before breakfast. Write it down and keep it in a log. Take your medicines as prescribed Eat low salt foods--Limit salt (sodium) to 2000 mg per day.  Stay as active as you can everyday Limit all fluids for the day to less than 2 liters  At the [facility], you and your health needs are our priority. As part of our continuing mission to provide you with exceptional heart care, we have created designated Provider Care Teams. These Care Teams include your primary Cardiologist (physician) and Advanced Practice Providers (APPs- Physician Assistants and Nurse Practitioners) who all work together to provide you with the care you need, when you need it.   You may see any of the following providers on your designated Care Team at your next follow up: Dr Glori Bickers Dr Loralie Champagne Dr. Roxana Hires, NP Lyda Jester, [place] [facility] [place], [place] Forestine Na, NP Audry Riles, PharmD   Please be sure to bring in all your medications bottles to every appointment.   If you have any questions or concerns before your next appointment please send [place] a message through [place] or call our office at [phone].    TO LEAVE A MESSAGE FOR THE NURSE SELECT OPTION 2, PLEASE LEAVE A MESSAGE INCLUDING: YOUR NAME DATE OF BIRTH CALL BACK NUMBER REASON FOR CALL**this is important as we prioritize the call backs  YOU WILL RECEIVE A CALL BACK THE SAME DAY AS LONG AS YOU CALL BEFORE 4:00  PM

## 2022-06-20 NOTE — Research (Signed)
Called and spoke to patient about the [place] trial. Screening appt made for Oct 5 @ 0800. Emailed [facility] for review and patient brochure.

## 2022-06-21 ENCOUNTER — Other Ambulatory Visit (HOSPITAL_BASED_OUTPATIENT_CLINIC_OR_DEPARTMENT_OTHER): Payer: Self-pay

## 2022-07-03 DIAGNOSIS — Z006 Encounter for examination for normal comparison and control in clinical research program: Secondary | ICD-10-CM

## 2022-07-03 NOTE — Research (Signed)
Called patient to remind her of appointment 07/04/2022 @ 0800. Instructions given for her to fast and parking code given. She verbalized understanding and asked for an email to be sent since she is at work. Email sent with all the information in it

## 2022-07-05 DIAGNOSIS — Z006 Encounter for examination for normal comparison and control in clinical research program: Secondary | ICD-10-CM

## 2022-07-05 NOTE — Research (Signed)
Patient was a no show for appt on 07/04/2022 after confirming on 07/03/22. Will try to call to follow up

## 2022-07-19 ENCOUNTER — Telehealth (HOSPITAL_COMMUNITY): Payer: Self-pay | Admitting: *Deleted

## 2022-07-19 NOTE — Telephone Encounter (Signed)
Received fax from [facility]. Family dentistry   Per Dr Haroldine Laws: ok to proceed with dental treatment, no special precautions, no antibiotcs needed prior  Form faxed back to them at [phone]

## 2022-07-23 ENCOUNTER — Other Ambulatory Visit (HOSPITAL_BASED_OUTPATIENT_CLINIC_OR_DEPARTMENT_OTHER): Payer: Self-pay

## 2022-07-24 ENCOUNTER — Other Ambulatory Visit (HOSPITAL_BASED_OUTPATIENT_CLINIC_OR_DEPARTMENT_OTHER): Payer: Self-pay

## 2022-08-01 ENCOUNTER — Telehealth: Payer: Self-pay

## 2022-08-01 NOTE — Telephone Encounter (Signed)
Tried to call pt- NA- LMOM 

## 2022-08-01 NOTE — Telephone Encounter (Signed)
Called pt to see if she was still interested in the [place]) trial. Pt said that the reason she did not show up to her appointment was because she was scared of the study. Asked her if she would like to come in and we could go over all the aspects of the study or we could try to do it via phone.  She said she was at work right now and asked me to call her back in about an hour or so. Will call pt back.

## 2022-08-21 ENCOUNTER — Ambulatory Visit (INDEPENDENT_AMBULATORY_CARE_PROVIDER_SITE_OTHER): Payer: No Typology Code available for payment source | Admitting: Internal Medicine

## 2022-08-21 ENCOUNTER — Encounter: Payer: Self-pay | Admitting: Internal Medicine

## 2022-08-21 ENCOUNTER — Other Ambulatory Visit (HOSPITAL_BASED_OUTPATIENT_CLINIC_OR_DEPARTMENT_OTHER): Payer: Self-pay

## 2022-08-21 VITALS — BP 134/70 | HR 100 | Ht 60.05 in | Wt 257.0 lb

## 2022-08-21 DIAGNOSIS — E1159 Type 2 diabetes mellitus with other circulatory complications: Secondary | ICD-10-CM

## 2022-08-21 LAB — POCT GLYCOSYLATED HEMOGLOBIN (HGB A1C): Hemoglobin A1C: 6.5 % — AB (ref 4.0–5.6)

## 2022-08-21 MED ORDER — EMPAGLIFLOZIN 25 MG PO TABS
25.0000 mg | ORAL_TABLET | Freq: Every day | ORAL | 3 refills | Status: DC
  Filled 2022-08-21: qty 90, 90d supply, fill #0

## 2022-08-21 MED ORDER — SEMAGLUTIDE (1 MG/DOSE) 4 MG/3ML ~~LOC~~ SOPN
1.0000 mg | PEN_INJECTOR | SUBCUTANEOUS | 3 refills | Status: DC
  Filled 2022-08-21 – 2022-09-26 (×2): qty 3, 28d supply, fill #0
  Filled 2022-10-19 – 2022-12-24 (×7): qty 3, 28d supply, fill #1

## 2022-08-21 NOTE — Patient Instructions (Signed)
-   Continue Jardiance 25 mg, 1 tablet every morning  - Continue Ozempic 1 mg weekly    HOW TO TREAT LOW BLOOD SUGARS (Blood sugar LESS THAN 70 MG/DL) Please follow the RULE OF 15 for the treatment of hypoglycemia treatment (when your (blood sugars are less than 70 mg/dL)   STEP 1: Take 15 grams of carbohydrates when your blood sugar is low, which includes:  3-4 GLUCOSE TABS  OR 3-4 OZ OF JUICE OR REGULAR SODA OR ONE TUBE OF GLUCOSE GEL    STEP 2: RECHECK blood sugar in 15 MINUTES STEP 3: If your blood sugar is still low at the 15 minute recheck --> then, go back to STEP 1 and treat AGAIN with another 15 grams of carbohydrates.

## 2022-08-21 NOTE — Progress Notes (Signed)
Name: Catherine Espinoza  MRN/ DOB: [id number], [birth year]   Age/ Sex: 46 y.o., female    PCP: [name], NP   Reason for Endocrinology Evaluation: Type 2 Diabetes Mellitus     Date of Initial Endocrinology Visit: 12/04/2021    PATIENT IDENTIFIER: Catherine Espinoza is a 46 y.o. female with a past medical history of T2DM, HTN, CAD and CHF. The patient presented for initial endocrinology clinic visit on 12/04/2021 for consultative assistance with her diabetes management.    HPI: Catherine Espinoza was    Diagnosed with DM 2011 Prior Medications tried/Intolerance: Metformin -GI intolerance Currently checking blood sugars multiple x / day, through CGM Hypoglycemia episodes : Yes               symptoms: Yes                 Frequency: 2/week Hemoglobin A1c has ranged from 9.4% in 2012, peaking at 14.9% in 2019.  Patient has required hospitalization within the last 1 year from hyper or hypoglycemia: No  In terms of diet, the patient does eat Doritos, chips, peanut butter and crackers instead of breakfast.  She does admit to drinking regular [facility]  She is a tech   On her initial visit , her A1c 9.0% . Continued Toujeo , increased Jardiance and started Rybelsus which was switched to Ozempic per her request   She self discontinued  Toujeo 04/2022 due to hypoglycemia , A1c 3 months later was 6.5%     I attempted to screen her for [place] with 24-hour urinary cortisol in July 2023 but she did not return the sample  SUBJECTIVE:   During the last visit (04/08/2022): A1c 9.1 %   Today (08/21/22): Catherine Espinoza is here for follow-up on diabetes management. She checks her blood sugars multiple  times daily.  She has  had hypoglycemic episodes since the last clinic visit.  She has self discontinued Toujeo due to hypoglycemia   She has chronic diarrhea that she attributes to hx of cholecystectomy , she does vomit with eating greasy food . She takes the pepto bismol for belching       HOME DIABETES REGIMEN: Jardiance 25 mg daily  Ozempic 1 mg weekly   Toujeo 50 units daily- not taking     Statin: yes ACE-I/ARB: yes Prior Diabetic Education: Yes    CONTINUOUS GLUCOSE MONITORING RECORD INTERPRETATION    Dates of Recording: 11/9-//10/21/2021  Sensor description:freestyle libre  Results statistics:   CGM use % of time 19  Average and SD 106/22.4  Time in range  95 %  % Time Above 180 0  % Time above 250 0  % Time Below target 4    Glycemic patterns summary: Limited data during the day but BG's optimal during the night and late in the evening   Hyperglycemic episodes N/A  Hypoglycemic episodes occurred at night   Overnight periods: remains optimal      DIABETIC COMPLICATIONS: Microvascular complications:   Denies: CKD, neuropathy, retinopathy Last eye exam: Completed years ago  Macrovascular complications:  CAD/CHF Denies:  PVD, CVA   PAST HISTORY: Past Medical History:  Past Medical History:  Diagnosis Date   Abscess of right hand 07/13/2021   Anemia, unspecified    CAD (coronary artery disease)    Chronic left-sided thoracic back pain    Excessive or frequent menstruation    Morbid obesity ([place])    Obstructive sleep apnea 02/11/2011   Sleep study: severe OSA- rec CPAP  20cm small  full face mask   Proteinuria    Thoracic radiculopathy    Type II or unspecified type diabetes mellitus without mention of complication, not stated as uncontrolled    Unspecified essential hypertension    Past Surgical History:  Past Surgical History:  Procedure Laterality Date   ABDOMINAL HYSTERECTOMY     ABLATION COLPOCLESIS     CESAREAN SECTION     CLEFT PALATE REPAIR     CORONARY STENT INTERVENTION N/A 10/13/2020   Procedure: CORONARY STENT INTERVENTION;  Surgeon: Burnell Blanks, MD;  Location: [facility] [facility];  Service: Cardiovascular;  Laterality: N/A;   cyst removal     ECTOPIC PREGNANCY SURGERY     x 2   I & D  EXTREMITY Right 07/13/2021   Procedure: IRRIGATION AND DEBRIDEMENT OF HAND;  Surgeon: Sherilyn Cooter, MD;  Location: [facility];  Service: Orthopedics;  Laterality: Right;   OOPHORECTOMY Right 2002   RIGHT/LEFT HEART CATH AND CORONARY ANGIOGRAPHY N/A 10/13/2020   Procedure: RIGHT/LEFT HEART CATH AND CORONARY ANGIOGRAPHY;  Surgeon: Jolaine Artist, MD;  Location: [facility];  Service: Cardiovascular;  Laterality: N/A;    Social History:  reports that she has been smoking cigarettes. She has a 5.00 pack-year smoking history. She has never used smokeless tobacco. She reports current alcohol use. She reports that she does not use drugs. Family History:  Family History  Problem Relation Age of Onset   Heart attack Maternal Aunt    Diabetes Mother    Cancer Father        oral cancer     HOME MEDICATIONS: Allergies as of 08/21/2022       Reactions   Ibuprofen    REACTION: knots in mouth with SOB   Labetalol Nausea Only   Aspirin Other (See Comments)   Knots in mouth/ Higher doses        Medication List        Accurate as of August 21, 2022  1:35 PM. If you have any questions, ask your nurse or doctor.          acetaminophen 500 MG tablet Commonly known as: TYLENOL Take 1,000 mg by mouth every 6 (six) hours as needed for mild pain or moderate pain.   blood glucose meter kit and supplies Kit Dispense based on patient and insurance preference. Use up to four times daily as directed. (FOR ICD-9 250.00, 250.01).   carvedilol 12.5 MG tablet Commonly known as: COREG Take 2 tablets (25 mg total) by mouth 2 (two) times daily with a meal.   Entresto 97-103 MG Generic drug: sacubitril-valsartan Take 1 tablet by mouth 2 (two) times daily. NEEDS APPOINTMENT FOR ANY MORE REFILLS   FreeStyle Libre 2 Sensor Misc USE AS DIRECTED   furosemide 40 MG tablet Commonly known as: LASIX Take 1 tablet (40 mg total) by mouth daily.   Insulin Syringe 27G X 1/2" 0.5 ML Misc Use  as directed   isosorbide-hydrALAZINE 20-37.5 MG tablet Commonly known as: BiDil Take 1 tablet by mouth 3 (three) times daily.   Jardiance 25 MG Tabs tablet Generic drug: empagliflozin Take 1 tablet (25 mg total) by mouth daily before breakfast.   Ozempic (1 MG/DOSE) 4 MG/3ML Sopn Generic drug: Semaglutide (1 MG/DOSE) Inject 1 mg as directed once a week.   Repatha SureClick 031 MG/ML Soaj Generic drug: Evolocumab Inject 1 Dose into the skin every 14 (fourteen) days.   rosuvastatin 40 MG tablet Commonly known as: CRESTOR  TAKE 1 TABLET (40 MG TOTAL) BY MOUTH DAILY.   spironolactone 25 MG tablet Commonly known as: ALDACTONE TAKE 1 TABLET (25 MG TOTAL) BY MOUTH DAILY.   TechLite Pen Needles 32G X 6 MM Misc Generic drug: Insulin Pen Needle Use as directed once daily in the afternoon   Toujeo Max SoloStar 300 UNIT/ML Solostar Pen Generic drug: insulin glargine (2 Unit Dial) Inject 50 Units into the skin daily in the afternoon.         ALLERGIES: Allergies  Allergen Reactions   Ibuprofen     REACTION: knots in mouth with SOB   Labetalol Nausea Only   Aspirin Other (See Comments)    Knots in mouth/ Higher doses         OBJECTIVE:   VITAL SIGNS: BP 134/70 (BP Location: Left Arm, Patient Position: Sitting, Cuff Size: Large)   Pulse 100   Ht 5' 0.05" (1.525 m)   Wt 257 lb (116.6 kg)   LMP 09/13/2011   SpO2 99%   BMI 50.11 kg/m    PHYSICAL EXAM:  General: Pt appears well and is in NAD  Lungs: Clear with good BS bilat with no rales, rhonchi, or wheezes  Heart: RRR   Abdomen: Normoactive bowel sounds, soft, nontender, without masses or organomegaly palpable  Extremities:  Lower extremities - No pretibial edema. No lesions.  Neuro: MS is good with appropriate affect, pt is alert and Ox3    DM foot exam: 12/04/2021  The skin of the feet is intact without sores or ulcerations. The pedal pulses are 2+ on right and 2+ on left. The sensation is intact to a  screening 5.07, 10 gram monofilament bilaterally    DATA REVIEWED:  Lab Results  Component Value Date   HGBA1C 9.1 (A) 04/08/2022   HGBA1C 9.0 (A) 12/04/2021   HGBA1C 10.7 (H) 07/04/2021     Latest Reference Range & Units 06/20/22 12:05  Sodium 135 - 145 mmol/L 138  Potassium 3.5 - 5.1 mmol/L 3.5  Chloride 98 - 111 mmol/L 107  CO2 22 - 32 mmol/L 25  Glucose 70 - 99 mg/dL 95  BUN 6 - 20 mg/dL 8  Creatinine 0.44 - 1.00 mg/dL 0.73  Calcium 8.9 - 10.3 mg/dL 8.9  Anion gap 5 - 15  6     ASSESSMENT / PLAN / RECOMMENDATIONS:   1) Type 2 Diabetes Mellitus, Optimally controlled, With macrovascular complications - Most recent A1c of 6.5 %. Goal A1c < 7.0 %.     - Praised the pt on improved glycemic control  - She does have vomiting if she eats a greasy meal but this is manageable  - NO changes     MEDICATIONS: Stop  Toujeo  Continue Jardiance 25 mg daily Continue Ozempic 1 mg weekly    EDUCATION / INSTRUCTIONS: BG monitoring instructions: Patient is instructed to check her blood sugars 3 times a day, before meals. Call [facility] if: BG persistently < 70 I reviewed the Rule of 15 for the treatment of hypoglycemia in detail with the patient. Literature supplied.   2) Diabetic complications:  Eye: Does not have known diabetic retinopathy.  Neuro/ Feet: Does not have known diabetic peripheral neuropathy. Renal: Patient does not have known baseline CKD. She is  on an ACEI/ARB at present.  3) Dyslipidemia/CAD :   - Per cardiology    Follow-up in 6 months  Signed electronically by: Mack Guise, MD  [facility]  [facility] [address], [address] [place], [place] [address] Phone: [phone] FAX: [phone]   CC: [name], NP [place] [address] [place] [address] Phone: [phone]  Fax: [phone]    Return to Endocrinology clinic as below: Future Appointments  Date  Time Provider [place]  11/26/2022 10:15 AM Hilty, Nadean Corwin, MD [facility]

## 2022-08-25 ENCOUNTER — Telehealth: Payer: No Typology Code available for payment source | Admitting: Nurse Practitioner

## 2022-08-25 DIAGNOSIS — R6889 Other general symptoms and signs: Secondary | ICD-10-CM | POA: Diagnosis not present

## 2022-08-25 MED ORDER — OSELTAMIVIR PHOSPHATE 75 MG PO CAPS: 75.0000 mg | ORAL_CAPSULE | Freq: Two times a day (BID) | ORAL | 0 refills | Status: AC

## 2022-08-25 NOTE — Patient Instructions (Signed)
Catherine Espinoza, thank you for joining Gildardo Pounds, NP for today's virtual visit.  While this provider is not your primary care provider (PCP), if your PCP is located in our provider database this encounter information will be shared with them immediately following your visit.   [facility] account gives you access to today's visit and all your visits, tests, and labs performed at [facility] " click here if you don't have a [facility] account or go to [email or web address]  Consent: (Patient) Catherine Espinoza provided verbal consent for this virtual visit at the beginning of the encounter.  Current Medications:  Current Outpatient Medications:    oseltamivir (TAMIFLU) 75 MG capsule, Take 1 capsule (75 mg total) by mouth 2 (two) times daily for 5 days., Disp: 10 capsule, Rfl: 0   acetaminophen (TYLENOL) 500 MG tablet, Take 1,000 mg by mouth every 6 (six) hours as needed for mild pain or moderate pain., Disp: , Rfl:    blood glucose meter kit and supplies KIT, Dispense based on patient and insurance preference. Use up to four times daily as directed. (FOR ICD-9 250.00, 250.01)., Disp: 1 each, Rfl: 0   carvedilol (COREG) 12.5 MG tablet, Take 2 tablets (25 mg total) by mouth 2 (two) times daily with a meal., Disp: 60 tablet, Rfl: 11   Continuous Blood Gluc Sensor (FREESTYLE LIBRE 2 SENSOR) MISC, USE AS DIRECTED, Disp: 6 each, Rfl: 3   empagliflozin (JARDIANCE) 25 MG TABS tablet, Take 1 tablet (25 mg total) by mouth daily before breakfast., Disp: 90 tablet, Rfl: 3   Evolocumab (REPATHA SURECLICK) 622 MG/ML SOAJ, Inject 1 Dose into the skin every 14 (fourteen) days., Disp: 2 mL, Rfl: 11   furosemide (LASIX) 40 MG tablet, Take 1 tablet (40 mg total) by mouth daily., Disp: 90 tablet, Rfl: 3   Insulin Pen Needle 32G X 6 MM MISC, Use as directed once daily in the afternoon, Disp: 100 each, Rfl: 3   Insulin Syringe 27G X 1/2" 0.5 ML MISC, Use as directed, Disp: 100  each, Rfl: 3   isosorbide-hydrALAZINE (BIDIL) 20-37.5 MG tablet, Take 1 tablet by mouth 3 (three) times daily., Disp: 90 tablet, Rfl: 11   rosuvastatin (CRESTOR) 40 MG tablet, TAKE 1 TABLET (40 MG TOTAL) BY MOUTH DAILY., Disp: 90 tablet, Rfl: 3   sacubitril-valsartan (ENTRESTO) 97-103 MG, Take 1 tablet by mouth 2 (two) times daily. NEEDS APPOINTMENT FOR ANY MORE REFILLS, Disp: 90 tablet, Rfl: 0   Semaglutide, 1 MG/DOSE, 4 MG/3ML SOPN, Inject 1 mg as directed once a week., Disp: 9 mL, Rfl: 3   spironolactone (ALDACTONE) 25 MG tablet, TAKE 1 TABLET (25 MG TOTAL) BY MOUTH DAILY., Disp: 90 tablet, Rfl: 3   Medications ordered in this encounter:  Meds ordered this encounter  Medications   oseltamivir (TAMIFLU) 75 MG capsule    Sig: Take 1 capsule (75 mg total) by mouth 2 (two) times daily for 5 days.    Dispense:  10 capsule    Refill:  0    Order Specific Question:   Supervising Provider    Answer:   Chase Picket [id number]     *If you need refills on other medications prior to your next appointment, please contact your pharmacy*  Follow-Up: Call back or seek an in-person evaluation if the symptoms worsen or if the condition fails to improve as anticipated.  [place] [phone]  Other Instructions May alternate with Tylenol and Motrin for fever and  pain relief   If you have been instructed to have an in-person evaluation today at a local Urgent Care facility, please use the link below. It will take you to a list of all of our available [facility], including address, phone number and hours of operation. Please do not delay care.  [facility]  If you or a family member do not have a primary care provider, use the link below to schedule a visit and establish care. When you choose a [facility] primary care physician or advanced practice provider, you gain a long-term partner in health. Find a Primary Care Provider  Learn more about [facility]'s in-office and virtual care options: [facility]

## 2022-08-25 NOTE — Progress Notes (Signed)
Virtual Visit Consent   Catherine Espinoza, you are scheduled for a virtual visit with a [facility] provider today. Just as with appointments in the office, your consent must be obtained to participate. Your consent will be active for this visit and any virtual visit you may have with one of our providers in the next 365 days. If you have a MyChart account, a copy of this consent can be sent to you electronically.  As this is a virtual visit, video technology does not allow for your provider to perform a traditional examination. This may limit your provider's ability to fully assess your condition. If your provider identifies any concerns that need to be evaluated in person or the need to arrange testing (such as labs, EKG, etc.), we will make arrangements to do so. Although advances in technology are sophisticated, we cannot ensure that it will always work on either your end or our end. If the connection with a video visit is poor, the visit may have to be switched to a telephone visit. With either a video or telephone visit, we are not always able to ensure that we have a secure connection.  By engaging in this virtual visit, you consent to the provision of healthcare and authorize for your insurance to be billed (if applicable) for the services provided during this visit. Depending on your insurance coverage, you may receive a charge related to this service.  I need to obtain your verbal consent now. Are you willing to proceed with your visit today? Catherine Espinoza has provided verbal consent on 08/25/2022 for a virtual visit (video or telephone). Gildardo Pounds, NP  Date: 08/25/2022 2:42 PM  Virtual Visit via Video Note   I, Gildardo Pounds, connected with  Catherine Espinoza  ([id number], [birth year]) on 08/25/22 at  3:15 PM EST by a video-enabled telemedicine application and verified that I am speaking with the correct person using two identifiers.  Location: Patient: Virtual Visit Location Patient:  Home Provider: Virtual Visit Location Provider: Home Office   I discussed the limitations of evaluation and management by telemedicine and the availability of in person appointments. The patient expressed understanding and agreed to proceed.    History of Present Illness: Catherine Espinoza is a 46 y.o. who identifies as a female who was assigned female at birth, and is being seen today for flulike symptoms.  Ms. Kaser notes 2-day onset of fever, chills, body aches, decreased appetite and cough. COVID negative.   Problems:  Patient Active Problem List   Diagnosis Date Noted   Flank pain 03/11/2022   Depression with anxiety 03/11/2022   Type 2 diabetes mellitus with hyperglycemia, with long-term current use of insulin ([place]) 12/04/2021   Diabetes mellitus ([place]) 12/04/2021   Mixed dyslipidemia 12/04/2021   CAD (coronary artery disease)    Coronary artery disease involving native coronary artery of native heart with unstable angina pectoris (HCC)    CHF (congestive heart failure) ([place]) 10/12/2020   Preventative health care 06/21/2016   GERD (gastroesophageal reflux disease) 12/25/2014   Onychomycosis 08/11/2014   HTN (hypertension) 10/11/2013   Hidradenitis suppurativa 08/02/2011   Obstructive sleep apnea 02/15/2011   Hypertension 02/15/2011   Hyperlipidemia 01/16/2011   PROTEINURIA 11/16/2009   Uncontrolled type 2 diabetes mellitus with hyperglycemia, with long-term current use of insulin ([place]) 11/03/2009   ANEMIA 11/03/2009   MORBID OBESITY 10/31/2009   TOBACCO ABUSE 10/31/2009    Allergies:  Allergies  Allergen Reactions   Ibuprofen     REACTION:  knots in mouth with SOB   Labetalol Nausea Only   Aspirin Other (See Comments)    Knots in mouth/ Higher doses    Medications:  Current Outpatient Medications:    oseltamivir (TAMIFLU) 75 MG capsule, Take 1 capsule (75 mg total) by mouth 2 (two) times daily for 5 days., Disp: 10 capsule, Rfl: 0   acetaminophen (TYLENOL) 500 MG  tablet, Take 1,000 mg by mouth every 6 (six) hours as needed for mild pain or moderate pain., Disp: , Rfl:    blood glucose meter kit and supplies KIT, Dispense based on patient and insurance preference. Use up to four times daily as directed. (FOR ICD-9 250.00, 250.01)., Disp: 1 each, Rfl: 0   carvedilol (COREG) 12.5 MG tablet, Take 2 tablets (25 mg total) by mouth 2 (two) times daily with a meal., Disp: 60 tablet, Rfl: 11   Continuous Blood Gluc Sensor (FREESTYLE LIBRE 2 SENSOR) MISC, USE AS DIRECTED, Disp: 6 each, Rfl: 3   empagliflozin (JARDIANCE) 25 MG TABS tablet, Take 1 tablet (25 mg total) by mouth daily before breakfast., Disp: 90 tablet, Rfl: 3   Evolocumab (REPATHA SURECLICK) 144 MG/ML SOAJ, Inject 1 Dose into the skin every 14 (fourteen) days., Disp: 2 mL, Rfl: 11   furosemide (LASIX) 40 MG tablet, Take 1 tablet (40 mg total) by mouth daily., Disp: 90 tablet, Rfl: 3   Insulin Pen Needle 32G X 6 MM MISC, Use as directed once daily in the afternoon, Disp: 100 each, Rfl: 3   Insulin Syringe 27G X 1/2" 0.5 ML MISC, Use as directed, Disp: 100 each, Rfl: 3   isosorbide-hydrALAZINE (BIDIL) 20-37.5 MG tablet, Take 1 tablet by mouth 3 (three) times daily., Disp: 90 tablet, Rfl: 11   rosuvastatin (CRESTOR) 40 MG tablet, TAKE 1 TABLET (40 MG TOTAL) BY MOUTH DAILY., Disp: 90 tablet, Rfl: 3   sacubitril-valsartan (ENTRESTO) 97-103 MG, Take 1 tablet by mouth 2 (two) times daily. NEEDS APPOINTMENT FOR ANY MORE REFILLS, Disp: 90 tablet, Rfl: 0   Semaglutide, 1 MG/DOSE, 4 MG/3ML SOPN, Inject 1 mg as directed once a week., Disp: 9 mL, Rfl: 3   spironolactone (ALDACTONE) 25 MG tablet, TAKE 1 TABLET (25 MG TOTAL) BY MOUTH DAILY., Disp: 90 tablet, Rfl: 3  Observations/Objective: Patient is well-developed, well-nourished in no acute distress.  Resting comfortably  at home.  Head is normocephalic, atraumatic.  No labored breathing.  Speech is clear and coherent with logical content.  Patient is alert and  oriented at baseline.    Assessment and Plan: 1. Flu-like symptoms - oseltamivir (TAMIFLU) 75 MG capsule; Take 1 capsule (75 mg total) by mouth 2 (two) times daily for 5 days.  Dispense: 10 capsule; Refill: 0  May alternate Motrin for fever and pain relief   Follow Up Instructions: I discussed the assessment and treatment plan with the patient. The patient was provided an opportunity to ask questions and all were answered. The patient agreed with the plan and demonstrated an understanding of the instructions.  A copy of instructions were sent to the patient via MyChart unless otherwise noted below.    The patient was advised to call back or seek an in-person evaluation if the symptoms worsen or if the condition fails to improve as anticipated.  Time:  I spent 11 minutes with the patient via telehealth technology discussing the above problems/concerns.    Gildardo Pounds, NP

## 2022-08-26 ENCOUNTER — Encounter: Payer: Self-pay | Admitting: Internal Medicine

## 2022-08-30 ENCOUNTER — Other Ambulatory Visit (HOSPITAL_BASED_OUTPATIENT_CLINIC_OR_DEPARTMENT_OTHER): Payer: Self-pay

## 2022-09-26 ENCOUNTER — Other Ambulatory Visit: Payer: Self-pay

## 2022-09-26 ENCOUNTER — Other Ambulatory Visit (HOSPITAL_BASED_OUTPATIENT_CLINIC_OR_DEPARTMENT_OTHER): Payer: Self-pay

## 2022-09-26 ENCOUNTER — Other Ambulatory Visit (HOSPITAL_COMMUNITY): Payer: Self-pay | Admitting: Family Medicine

## 2022-09-26 MED ORDER — ENTRESTO 97-103 MG PO TABS
1.0000 | ORAL_TABLET | Freq: Two times a day (BID) | ORAL | 0 refills | Status: DC
  Filled 2022-09-26: qty 90, 45d supply, fill #0

## 2022-10-08 ENCOUNTER — Other Ambulatory Visit (HOSPITAL_BASED_OUTPATIENT_CLINIC_OR_DEPARTMENT_OTHER): Payer: Self-pay

## 2022-10-08 MED ORDER — CHLORHEXIDINE GLUCONATE 0.12 % MT SOLN
15.0000 mL | Freq: Two times a day (BID) | OROMUCOSAL | 0 refills | Status: DC
  Filled 2022-10-08: qty 473, 16d supply, fill #0

## 2022-10-08 MED ORDER — PENICILLIN V POTASSIUM 500 MG PO TABS
500.0000 mg | ORAL_TABLET | Freq: Four times a day (QID) | ORAL | 0 refills | Status: DC
  Filled 2022-10-08: qty 56, 14d supply, fill #0

## 2022-10-17 ENCOUNTER — Other Ambulatory Visit (HOSPITAL_BASED_OUTPATIENT_CLINIC_OR_DEPARTMENT_OTHER): Payer: Self-pay

## 2022-10-21 ENCOUNTER — Other Ambulatory Visit (HOSPITAL_BASED_OUTPATIENT_CLINIC_OR_DEPARTMENT_OTHER): Payer: Self-pay

## 2022-10-29 ENCOUNTER — Other Ambulatory Visit (HOSPITAL_BASED_OUTPATIENT_CLINIC_OR_DEPARTMENT_OTHER): Payer: Self-pay

## 2022-10-30 ENCOUNTER — Other Ambulatory Visit (HOSPITAL_BASED_OUTPATIENT_CLINIC_OR_DEPARTMENT_OTHER): Payer: Self-pay

## 2022-11-07 ENCOUNTER — Other Ambulatory Visit (HOSPITAL_BASED_OUTPATIENT_CLINIC_OR_DEPARTMENT_OTHER): Payer: Self-pay

## 2022-11-08 ENCOUNTER — Telehealth: Payer: Self-pay

## 2022-11-08 ENCOUNTER — Encounter: Payer: Self-pay | Admitting: Internal Medicine

## 2022-11-08 ENCOUNTER — Other Ambulatory Visit (HOSPITAL_BASED_OUTPATIENT_CLINIC_OR_DEPARTMENT_OTHER): Payer: Self-pay

## 2022-11-08 NOTE — Telephone Encounter (Signed)
Are they any other alternatives for patient as the Ozempic cost to much

## 2022-11-12 ENCOUNTER — Telehealth: Payer: Self-pay | Admitting: Internal Medicine

## 2022-11-12 NOTE — Telephone Encounter (Signed)
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    Form is in MD folder.

## 2022-11-26 ENCOUNTER — Ambulatory Visit (HOSPITAL_BASED_OUTPATIENT_CLINIC_OR_DEPARTMENT_OTHER): Payer: Commercial Managed Care - PPO | Admitting: Internal Medicine

## 2022-11-29 ENCOUNTER — Other Ambulatory Visit (HOSPITAL_BASED_OUTPATIENT_CLINIC_OR_DEPARTMENT_OTHER): Payer: Self-pay

## 2022-12-02 ENCOUNTER — Telehealth: Payer: Self-pay

## 2022-12-02 ENCOUNTER — Encounter: Payer: Self-pay | Admitting: Internal Medicine

## 2022-12-02 ENCOUNTER — Other Ambulatory Visit (HOSPITAL_COMMUNITY): Payer: Self-pay

## 2022-12-02 NOTE — Telephone Encounter (Signed)
PA request received via fax from [place] for Ozempic (1 MG/DOSE) '4MG'$ /3ML pen-injectors  PA has been faxed to [employer] via [place] and is pending determination.   KEY: [id number]

## 2022-12-02 NOTE — Telephone Encounter (Signed)
Ozempic needs a PA.

## 2022-12-12 ENCOUNTER — Other Ambulatory Visit (HOSPITAL_BASED_OUTPATIENT_CLINIC_OR_DEPARTMENT_OTHER): Payer: Self-pay

## 2022-12-12 MED ORDER — CEPHALEXIN 500 MG PO CAPS
500.0000 mg | ORAL_CAPSULE | Freq: Two times a day (BID) | ORAL | 0 refills | Status: DC
  Filled 2022-12-12: qty 14, 7d supply, fill #0

## 2022-12-13 ENCOUNTER — Other Ambulatory Visit (HOSPITAL_BASED_OUTPATIENT_CLINIC_OR_DEPARTMENT_OTHER): Payer: Self-pay

## 2022-12-24 ENCOUNTER — Other Ambulatory Visit (HOSPITAL_BASED_OUTPATIENT_CLINIC_OR_DEPARTMENT_OTHER): Payer: Self-pay

## 2023-01-23 NOTE — Telephone Encounter (Signed)
PA has been APPROVED from 12/05/2022-12/04/2023. Approval letter has been attached in patients documents

## 2023-02-04 ENCOUNTER — Other Ambulatory Visit: Payer: Self-pay | Admitting: Internal Medicine

## 2023-02-05 ENCOUNTER — Other Ambulatory Visit (HOSPITAL_BASED_OUTPATIENT_CLINIC_OR_DEPARTMENT_OTHER): Payer: Self-pay

## 2023-02-05 MED ORDER — FREESTYLE LIBRE 2 SENSOR MISC
3 refills | Status: DC
  Filled 2023-02-05: qty 2, 28d supply, fill #0

## 2023-02-14 ENCOUNTER — Other Ambulatory Visit (HOSPITAL_BASED_OUTPATIENT_CLINIC_OR_DEPARTMENT_OTHER): Payer: Self-pay

## 2023-02-21 ENCOUNTER — Ambulatory Visit (INDEPENDENT_AMBULATORY_CARE_PROVIDER_SITE_OTHER): Payer: Commercial Managed Care - PPO | Admitting: Internal Medicine

## 2023-02-21 ENCOUNTER — Other Ambulatory Visit (HOSPITAL_BASED_OUTPATIENT_CLINIC_OR_DEPARTMENT_OTHER): Payer: Self-pay

## 2023-02-21 ENCOUNTER — Encounter: Payer: Self-pay | Admitting: Internal Medicine

## 2023-02-21 VITALS — BP 134/86 | HR 94 | Ht 60.05 in | Wt 265.0 lb

## 2023-02-21 DIAGNOSIS — Z7985 Long-term (current) use of injectable non-insulin antidiabetic drugs: Secondary | ICD-10-CM

## 2023-02-21 DIAGNOSIS — E1159 Type 2 diabetes mellitus with other circulatory complications: Secondary | ICD-10-CM | POA: Diagnosis not present

## 2023-02-21 DIAGNOSIS — Z7984 Long term (current) use of oral hypoglycemic drugs: Secondary | ICD-10-CM | POA: Diagnosis not present

## 2023-02-21 LAB — POCT GLYCOSYLATED HEMOGLOBIN (HGB A1C): Hemoglobin A1C: 6.7 % — AB (ref 4.0–5.6)

## 2023-02-21 LAB — POCT GLUCOSE (DEVICE FOR HOME USE): POC Glucose: 158 mg/dl — AB (ref 70–99)

## 2023-02-21 MED ORDER — SEMAGLUTIDE (1 MG/DOSE) 4 MG/3ML ~~LOC~~ SOPN
1.0000 mg | PEN_INJECTOR | SUBCUTANEOUS | 3 refills | Status: DC
  Filled 2023-02-21: qty 3, 28d supply, fill #0
  Filled 2023-07-21: qty 9, 84d supply, fill #0

## 2023-02-21 MED ORDER — EMPAGLIFLOZIN 25 MG PO TABS
25.0000 mg | ORAL_TABLET | Freq: Every day | ORAL | 3 refills | Status: DC
  Filled 2023-02-21: qty 30, 30d supply, fill #0

## 2023-02-21 NOTE — Progress Notes (Signed)
Name: Catherine Espinoza  MRN/ DOB: [id number], January 13, 1976   Age/ Sex: 47 y.o., female    PCP: Sandford Craze, NP   Reason for Endocrinology Evaluation: Type 2 Diabetes Mellitus     Date of Initial Endocrinology Visit: 12/04/2021    PATIENT IDENTIFIER: [name] is a 47 y.o. female with a past medical history of T2DM, HTN, CAD and CHF. The patient presented for initial endocrinology clinic visit on 12/04/2021 for consultative assistance with her diabetes management.    HPI: [name] was    Diagnosed with DM 2011 Prior Medications tried/Intolerance: Metformin -GI intolerance Hemoglobin A1c has ranged from 9.4% in 2012, peaking at 14.9% in 2019.   On her initial visit , her A1c 9.0% . Continued Toujeo , increased Jardiance and started Rybelsus which was switched to Ozempic per her request   She self discontinued  Toujeo 04/2022 due to hypoglycemia , A1c 3 months later was 6.5%     I attempted to screen her for [place] with 24-hour urinary cortisol in July 2023 but she did not return the sample  SUBJECTIVE:   During the last visit (08/21/2022): A1c 6.5 %      Today (02/21/23): [name] is here for follow-up on diabetes management. She has not been able to use the freestyle libre  not covered by her insurance anymore.    She continues with occasional diarrhea, depending on what she eats No nausea or vomiting  She is interested in weight loss surgery, has a new insurance with very high deductible   HOME DIABETES REGIMEN: Jardiance 25 mg daily  Ozempic 1 mg weekly       Statin: yes ACE-I/ARB: yes Prior Diabetic Education: Yes      DIABETIC COMPLICATIONS: Microvascular complications:   Denies: CKD, neuropathy, retinopathy Last eye exam: Completed years ago  Macrovascular complications:  CAD/CHF Denies:  PVD, CVA   PAST HISTORY: Past Medical History:  Past Medical History:  Diagnosis Date   Abscess of right hand 07/13/2021   Anemia,  unspecified    CAD (coronary artery disease)    Chronic left-sided thoracic back pain    Excessive or frequent menstruation    Morbid obesity (HCC)    Obstructive sleep apnea 02/11/2011   Sleep study: severe OSA- rec CPAP 20cm small  full face mask   Proteinuria    Thoracic radiculopathy    Type II or unspecified type diabetes mellitus without mention of complication, not stated as uncontrolled    Unspecified essential hypertension    Past Surgical History:  Past Surgical History:  Procedure Laterality Date   ABDOMINAL HYSTERECTOMY     ABLATION COLPOCLESIS     CESAREAN SECTION     CLEFT PALATE REPAIR     CORONARY STENT INTERVENTION N/A 10/13/2020   Procedure: CORONARY STENT INTERVENTION;  Surgeon: Kathleene Hazel, MD;  Location: [facility];  Service: Cardiovascular;  Laterality: N/A;   cyst removal     ECTOPIC PREGNANCY SURGERY     x 2   I & D EXTREMITY Right 07/13/2021   Procedure: IRRIGATION AND DEBRIDEMENT OF HAND;  Surgeon: Marlyne Beards, MD;  Location: [facility] OR;  Service: Orthopedics;  Laterality: Right;   OOPHORECTOMY Right 2002   RIGHT/LEFT HEART CATH AND CORONARY ANGIOGRAPHY N/A 10/13/2020   Procedure: RIGHT/LEFT HEART CATH AND CORONARY ANGIOGRAPHY;  Surgeon: Dolores Patty, MD;  Location: [facility];  Service: Cardiovascular;  Laterality: N/A;    Social History:  reports that she has been  smoking cigarettes. She has a 5.00 pack-year smoking history. She has never used smokeless tobacco. She reports current alcohol use. She reports that she does not use drugs. Family History:  Family History  Problem Relation Age of Onset   Heart attack Maternal Aunt    Diabetes Mother    Cancer Father        oral cancer     HOME MEDICATIONS: Allergies as of 02/21/2023       Reactions   Ibuprofen    REACTION: knots in mouth with SOB   Labetalol Nausea Only   Aspirin Other (See Comments)   Knots in mouth/ Higher doses        Medication List         Accurate as of Feb 21, 2023  2:08 PM. If you have any questions, ask your nurse or doctor.          STOP taking these medications    cephALEXin 500 MG capsule Commonly known as: KEFLEX Stopped by: Scarlette Shorts, MD   chlorhexidine 0.12 % solution Commonly known as: Peridex Stopped by: Scarlette Shorts, MD   penicillin v potassium 500 MG tablet Commonly known as: VEETID Stopped by: Scarlette Shorts, MD       TAKE these medications    acetaminophen 500 MG tablet Commonly known as: TYLENOL Take 1,000 mg by mouth every 6 (six) hours as needed for mild pain or moderate pain.   blood glucose meter kit and supplies Kit Dispense based on patient and insurance preference. Use up to four times daily as directed. (FOR ICD-9 250.00, 250.01).   carvedilol 12.5 MG tablet Commonly known as: COREG Take 2 tablets (25 mg total) by mouth 2 (two) times daily with a meal.   empagliflozin 25 MG Tabs tablet Commonly known as: Jardiance Take 1 tablet (25 mg total) by mouth daily before breakfast.   Entresto 97-103 MG Generic drug: sacubitril-valsartan Take 1 tablet by mouth 2 (two) times daily. NEEDS APPOINTMENT FOR ANY MORE REFILLS   FreeStyle Libre 2 Sensor Misc USE AS DIRECTED   furosemide 40 MG tablet Commonly known as: LASIX Take 1 tablet (40 mg total) by mouth daily.   Insulin Syringe 27G X 1/2" 0.5 ML Misc Use as directed   isosorbide-hydrALAZINE 20-37.5 MG tablet Commonly known as: BiDil Take 1 tablet by mouth 3 (three) times daily.   Ozempic (1 MG/DOSE) 4 MG/3ML Sopn Generic drug: Semaglutide (1 MG/DOSE) Inject 1 mg as directed once a week.   Repatha SureClick 140 MG/ML Soaj Generic drug: Evolocumab Inject 1 Dose into the skin every 14 (fourteen) days.   rosuvastatin 40 MG tablet Commonly known as: CRESTOR TAKE 1 TABLET (40 MG TOTAL) BY MOUTH DAILY.   spironolactone 25 MG tablet Commonly known as: ALDACTONE TAKE 1 TABLET (25 MG TOTAL) BY  MOUTH DAILY.   TechLite Pen Needles 32G X 6 MM Misc Generic drug: Insulin Pen Needle Use as directed once daily in the afternoon         ALLERGIES: Allergies  Allergen Reactions   Ibuprofen     REACTION: knots in mouth with SOB   Labetalol Nausea Only   Aspirin Other (See Comments)    Knots in mouth/ Higher doses         OBJECTIVE:   VITAL SIGNS: Ht 5' 0.05" (1.525 m)   Wt 265 lb (120.2 kg)   LMP 09/13/2011   BMI 51.67 kg/m    PHYSICAL EXAM:  General: Pt appears well and  is in NAD  Lungs: Clear with good BS bilat  Heart: RRR   Abdomen: Soft, nontender  Extremities:  Lower extremities - No pretibial edema. No lesions.  Neuro: MS is good with appropriate affect, pt is alert and Ox3    DM foot exam: 02/21/2023  The skin of the feet is intact without sores or ulcerations. The pedal pulses are 2+ on right and 2+ on left. The sensation is intact to a screening 5.07, 10 gram monofilament bilaterally    DATA REVIEWED:  Lab Results  Component Value Date   HGBA1C 6.5 (A) 08/21/2022   HGBA1C 9.1 (A) 04/08/2022   HGBA1C 9.0 (A) 12/04/2021     Latest Reference Range & Units 06/20/22 12:05  Sodium 135 - 145 mmol/L 138  Potassium 3.5 - 5.1 mmol/L 3.5  Chloride 98 - 111 mmol/L 107  CO2 22 - 32 mmol/L 25  Glucose 70 - 99 mg/dL 95  BUN 6 - 20 mg/dL 8  Creatinine 0.98 - 1.19 mg/dL 1.47  Calcium 8.9 - 82.9 mg/dL 8.9  Anion gap 5 - 15  6     ASSESSMENT / PLAN / RECOMMENDATIONS:   1) Type 2 Diabetes Mellitus, Optimally controlled, With macrovascular complications - Most recent A1c of 6.7 %. Goal A1c < 7.0 %.     -A1c remains at goal - Pt with a very high deductible $5000, she has not been able to get freestyle libre nor has she been able to get Ozempic to the pharmacy, but has been getting Ozempic from a friend for $25, would not change the dose at this time, she is hoping that insurance will improve by the next renewal cycle - Interested in weight loss  sx    MEDICATIONS:  Continue Jardiance 25 mg daily Continue Ozempic 1 mg weekly    EDUCATION / INSTRUCTIONS: BG monitoring instructions: Patient is instructed to check her blood sugars 3 times a day, before meals. Call  Endocrinology clinic if: BG persistently < 70 I reviewed the Rule of 15 for the treatment of hypoglycemia in detail with the patient. Literature supplied.   2) Diabetic complications:  Eye: Does not have known diabetic retinopathy.  Neuro/ Feet: Does not have known diabetic peripheral neuropathy. Renal: Patient does not have known baseline CKD. She is  on an ACEI/ARB at present.  3) Dyslipidemia/CAD :   - Per cardiology    Follow-up in 6 months  Signed electronically by: Lyndle Herrlich, MD  [facility] End[facility]  [facility] [address] [place], [place] [address] Phone: [phone] FAX: [phone]   CC: Sandford Craze, NP [address] [place] [address] Phone: [phone]  Fax: [phone]    Return to Endocrinology clinic as below: Future Appointments  Date Time Provider Department Center  02/27/2023  9:20 AM Bensimhon, Bevelyn Buckles, MD [facility] None

## 2023-02-21 NOTE — Patient Instructions (Signed)
-   Continue Jardiance 25 mg, 1 tablet every morning  - Continue Ozempic 1 mg weekly    HOW TO TREAT LOW BLOOD SUGARS (Blood sugar LESS THAN 70 MG/DL) Please follow the RULE OF 15 for the treatment of hypoglycemia treatment (when your (blood sugars are less than 70 mg/dL)   STEP 1: Take 15 grams of carbohydrates when your blood sugar is low, which includes:  3-4 GLUCOSE TABS  OR 3-4 OZ OF JUICE OR REGULAR SODA OR ONE TUBE OF GLUCOSE GEL    STEP 2: RECHECK blood sugar in 15 MINUTES STEP 3: If your blood sugar is still low at the 15 minute recheck --> then, go back to STEP 1 and treat AGAIN with another 15 grams of carbohydrates.    

## 2023-02-24 NOTE — Progress Notes (Signed)
[facility] Note   PCP: Sandford Craze, NP HF Cardiologist: Dr. Gala Romney  Reason for visit/CC: Follow up for HF  HPI: Catherine Espinoza is a 47 y.o.female with DM2, HTN, morbid obesity, CAD with previous LAD stent, OSA and systolic HF  Presented to ED on 10/11/20 for leg swelling. Echo EF 20-25%. R/LHC completed; patient had PCI to LAD, distal RCA disease treated medically, preserved CO, elevated filling pressures.   Echo 03/12/21 EF 40-45%   Sleep study 5/23 Severe OSA with AHI 47 She missed appointment for CPAP titration in August d/t illness.  Echo 06/23: EF 50-55%, grade I DD, RV systolic function not well visualized, RVSP 32 mmHg  She is here today for f/u. Says she feels great.  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 Remains on ozempic but has not lost weight. About to increase the dose. Denies CP, SOB, orthopnea or PND. Says BP has been good. Compliant with meds. Did not tolerate Bidil due to dizziness.   Denies ETOH use. Smokes about 1/2 ppd. Not ready to quit.   Cardiac Studies  Echo 1/22: EF 20-25% with global hypokinesis, mild LVH, grade I DD, RVSP 54, small pericardial effusion and IVC dilated w/ RAP 15.  R/LHC 1/22: RHC/LHC: PCI to LAD Prox RCA lesion is 30% stenosed. RPDA lesion is 95% stenosed. RPAV lesion is 99% stenosed. Prox LAD to Mid LAD lesion is 40% stenosed. Mid LAD lesion is 80% stenosed. 2nd Diag lesion is 50% stenosed.   Findings:  Ao = 147/97 (119) LV =  140/27 RA =  12 RV = 68/15 PA = 72/37 (51) PCW = 34 Fick cardiac output/index = 6.3/2.9 PVR = 2.6 WU FA sat = 97% PA sat = 72%, 72%  Assessment: 1. 2v CAD with high grade lesions in the mid LAD and distal RCA 2. 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 Markedly elevated filling pressures with normal cardiac output  Plan/Discussion: Plan PCI of LAD followed by aggressive diuresis and titration of GDMT  ROS: All systems  reviewed and negative except as per HPI.   Past Medical History:  Diagnosis Date   Abscess of right hand 07/13/2021   Anemia, unspecified    CAD (coronary artery disease)    Chronic left-sided thoracic back pain    Excessive or frequent menstruation    Morbid obesity (HCC)    Obstructive sleep apnea 02/11/2011   Sleep study: severe OSA- rec CPAP 20cm small  full face mask   Proteinuria    Thoracic radiculopathy    Type II or unspecified type diabetes mellitus without mention of complication, not stated as uncontrolled    Unspecified essential hypertension     Current Outpatient Medications  Medication Sig Dispense Refill   acetaminophen (TYLENOL) 500 MG tablet Take 1,000 mg by mouth every 6 (six) hours as needed for mild pain or moderate pain.     blood glucose meter kit and supplies KIT Dispense based on patient and insurance preference. Use up to four times daily as directed. (FOR ICD-9 250.00, 250.01). 1 each 0   carvedilol (COREG) 12.5 MG tablet Take 2 tablets (25 mg total) by mouth 2 (two) times daily with a meal. 60 tablet 11   Continuous Glucose Sensor (FREESTYLE LIBRE 2 SENSOR) MISC USE AS DIRECTED 6 each 3   empagliflozin (JARDIANCE) 25 MG TABS tablet Take 1 tablet (25 mg total) by mouth daily before breakfast. 90 tablet 3  furosemide (LASIX) 40 MG tablet Take 1 tablet (40 mg total) by mouth daily. 90 tablet 3   Insulin Pen Needle 32G X 6 MM MISC Use as directed once daily in the afternoon 100 each 3   Insulin Syringe 27G X 1/2" 0.5 ML MISC Use as directed 100 each 3   rosuvastatin (CRESTOR) 40 MG tablet TAKE 1 TABLET (40 MG TOTAL) BY MOUTH DAILY. 90 tablet 3   sacubitril-valsartan (ENTRESTO) 97-103 MG Take 1 tablet by mouth 2 (two) times daily. NEEDS APPOINTMENT FOR ANY MORE REFILLS 90 tablet 0   Semaglutide, 1 MG/DOSE, 4 MG/3ML SOPN Inject 1 mg as directed once a week. 9 mL 3   spironolactone (ALDACTONE) 25 MG tablet TAKE 1 TABLET (25 MG TOTAL) BY MOUTH DAILY. 90 tablet 3    No current facility-administered medications for this encounter.    Allergies  Allergen Reactions   Ibuprofen     REACTION: knots in mouth with SOB   Labetalol Nausea Only   Aspirin Other (See Comments)    Knots in mouth/ Higher doses     Social History   Socioeconomic History   Marital status: Single    Spouse name: Not on file   Number of children: 1   Years of education: Not on file   Highest education level: Associate degree: occupational, Scientist, product/process development, or vocational program  Occupational History   Occupation: cna/med Theme park manager: [employer]  Tobacco Use   Smoking status: Every Day    Packs/day: 0.50    Years: 10.00    Additional pack years: 0.00    Total pack years: 5.00    Types: Cigarettes   Smokeless tobacco: Never  Vaping Use   Vaping Use: Never used  Substance and Sexual Activity   Alcohol use: Yes    Alcohol/week: 0.0 standard drinks of alcohol    Comment: occasional use   Drug use: No   Sexual activity: Yes    Birth control/protection: Surgical  Other Topics Concern   Not on file  Social History Narrative   Married-filing for divorced   Daughter born 2003   Works as cna/med [employer]   Social Determinants of Health   Financial Resource Strain: High Risk (10/12/2020)   Overall Financial Resource Strain (CARDIA)    Difficulty of Paying Living Expenses: Hard  Food Insecurity: No Food Insecurity (10/12/2020)   Hunger Vital Sign    Worried About Running Out of Food in the Last Year: Never true    Ran Out of Food in the Last Year: Never true  Transportation Needs: No Transportation Needs (10/12/2020)   PRAPARE - Administrator, Civil Service (Medical): No    Lack of Transportation (Non-Medical): No  Physical Activity: Not on file  Stress: Not on file  Social Connections: Not on file  Intimate Partner Violence: Not At Risk (10/12/2020)   Humiliation, Afraid, Rape, and Kick questionnaire    Fear of Current or Ex-Partner: No     Emotionally Abused: No    Physically Abused: No    Sexually Abused: No   Family History  Problem Relation Age of Onset   Heart attack Maternal Aunt    Diabetes Mother    Cancer Father        oral cancer   Vitals:   02/27/23 0921  BP: (!) 170/100  Pulse: 81  SpO2: 97%  Weight: 120 kg (264 lb 9.6 oz)      Wt Readings from Last 3 Encounters:  02/27/23 120 kg (264 lb 9.6 oz)  02/21/23 120.2 kg (265 lb)  08/21/22 116.6 kg (257 lb)   PHYSICAL EXAM: General:  Well appearing. No resp difficulty HEENT: normal Neck: supple. no JVD. Carotids 2+ bilat; no bruits. No lymphadenopathy or thryomegaly appreciated. Cor: PMI nondisplaced. Regular rate & rhythm. No rubs, gallops or murmurs. Lungs: clear Abdomen: obese soft, nontender, nondistended. No hepatosplenomegaly. No bruits or masses. Good bowel sounds. Extremities: no cyanosis, clubbing, rash, edema Neuro: alert & orientedx3, cranial nerves grossly intact. moves all 4 extremities w/o difficulty. Affect pleasant   ASSESSMENT & PLAN:  1.  Chronic Systolic Heart Failure - Echo 1/61 EF 20-25% with global hypokinesis, mild LVH, grade I DD, RVSP 54, small pericardial effusion and IVC dilated w/ RAP 15.  - Cath with LAD lesion s/p PCI - Echo 03/12/21 EF 40-45% - Echo 06/23: EF 50-55% - CM felt to be mixed iCM and HTN - NYHA I-II - Volume status looks good Continue lasix 40 mg daily + 20 mEq of KCl. - Continue carvedilol 25 bid - Continue spiro 25 mg daily. - Continue Farxiga 10 mg daily. No GU symptoms. - Continue Entresto 97/103 mg bid. - Did not tolerate Bidil due to lightheadedness - Repeat echo at next visit. If stable can graduate HF Clinic  2. CAD - S/P PCI LAD 1/22 - On Aspirin + crestor + plavix.  - No s/s angina - Lipids being managed by Dr. Rennis Golden - Stressed need for smoking cessation, stress reduction and DM2 control  3. DM2 - Improved control. A1c 9.0%-> 6.7 (02/21/23) - On semaglutide and farxiga - following  with endocrine   4. Hypertension - BP elevated here but says it has been better when she checks it elsewhere - Keep daily BP log. If SBP > 140 routinely add amlodipine (din't toelrate Bidil) - OSA and obesity likely contributing - Meds as above   5. Obesity - Body mass index is 51.59 kg/m. - Managed by PCP  6. Tobacco use - Smoking 1/2 ppd - Discussed cessation   7. OSA - Severe. AHI 47 - recommended she reschedule CPAP titration - Will reach out to Dr. Mayford Knife to f/u  8. Hyperlipidenmia - followed by Dr. Rennis Golden  Follow-up: 4 months with APP with echo  Arvilla Meres, MD 02/27/23

## 2023-02-27 ENCOUNTER — Encounter (HOSPITAL_COMMUNITY): Payer: Self-pay | Admitting: Internal Medicine

## 2023-02-27 ENCOUNTER — Ambulatory Visit (HOSPITAL_COMMUNITY)
Admission: RE | Admit: 2023-02-27 | Discharge: 2023-02-27 | Disposition: A | Discharge status: Home / Self Care | Payer: Commercial Managed Care - PPO | Source: Ambulatory Visit | Attending: Internal Medicine | Admitting: Internal Medicine

## 2023-02-27 VITALS — BP 170/100 | HR 81 | Wt 264.6 lb

## 2023-02-27 DIAGNOSIS — Z79899 Other long term (current) drug therapy: Secondary | ICD-10-CM | POA: Insufficient documentation

## 2023-02-27 DIAGNOSIS — F17200 Nicotine dependence, unspecified, uncomplicated: Secondary | ICD-10-CM | POA: Diagnosis not present

## 2023-02-27 DIAGNOSIS — Z7984 Long term (current) use of oral hypoglycemic drugs: Secondary | ICD-10-CM | POA: Diagnosis not present

## 2023-02-27 DIAGNOSIS — Z955 Presence of coronary angioplasty implant and graft: Secondary | ICD-10-CM | POA: Insufficient documentation

## 2023-02-27 DIAGNOSIS — I110 Hypertensive heart disease with heart failure: Secondary | ICD-10-CM | POA: Insufficient documentation

## 2023-02-27 DIAGNOSIS — I2510 Atherosclerotic heart disease of native coronary artery without angina pectoris: Secondary | ICD-10-CM | POA: Insufficient documentation

## 2023-02-27 DIAGNOSIS — Z7985 Long-term (current) use of injectable non-insulin antidiabetic drugs: Secondary | ICD-10-CM | POA: Insufficient documentation

## 2023-02-27 DIAGNOSIS — I428 Other cardiomyopathies: Secondary | ICD-10-CM | POA: Insufficient documentation

## 2023-02-27 DIAGNOSIS — Z794 Long term (current) use of insulin: Secondary | ICD-10-CM | POA: Insufficient documentation

## 2023-02-27 DIAGNOSIS — E119 Type 2 diabetes mellitus without complications: Secondary | ICD-10-CM | POA: Diagnosis not present

## 2023-02-27 DIAGNOSIS — Z6841 Body Mass Index (BMI) 40.0 and over, adult: Secondary | ICD-10-CM | POA: Insufficient documentation

## 2023-02-27 DIAGNOSIS — I5022 Chronic systolic (congestive) heart failure: Secondary | ICD-10-CM

## 2023-02-27 DIAGNOSIS — F17210 Nicotine dependence, cigarettes, uncomplicated: Secondary | ICD-10-CM | POA: Diagnosis not present

## 2023-02-27 DIAGNOSIS — G4733 Obstructive sleep apnea (adult) (pediatric): Secondary | ICD-10-CM | POA: Diagnosis not present

## 2023-02-27 DIAGNOSIS — R9431 Abnormal electrocardiogram [ECG] [EKG]: Secondary | ICD-10-CM | POA: Insufficient documentation

## 2023-02-27 DIAGNOSIS — E785 Hyperlipidemia, unspecified: Secondary | ICD-10-CM | POA: Insufficient documentation

## 2023-02-27 DIAGNOSIS — I10 Essential (primary) hypertension: Secondary | ICD-10-CM

## 2023-02-27 NOTE — Patient Instructions (Signed)
Medication Changes:  None, continue current medications  Lab Work:  none  Testing/Procedures:  Your physician has requested that you have an echocardiogram. Echocardiography is a painless test that uses sound waves to create images of your heart. It provides your doctor with information about the size and shape of your heart and how well your heart's chambers and valves are working. This procedure takes approximately one hour. There are no restrictions for this procedure. Please do NOT wear cologne, perfume, aftershave, or lotions (deodorant is allowed). Please arrive 15 minutes prior to your appointment time. IN 4 MONTHS  Referrals:  none  Special Instructions // Education:  Your physician has requested that you regularly monitor and record your blood pressure readings at home. Please use the same machine at the same time of day to check your readings and record them to bring to your follow-up visit.  Do the following things EVERYDAY: Weigh yourself in the morning before breakfast. Write it down and keep it in a log. Take your medicines as prescribed Eat low salt foods--Limit salt (sodium) to 2000 mg per day.  Stay as active as you can everyday Limit all fluids for the day to less than 2 liters   Follow-Up in: 4 months  At the [facility], you and your health needs are our priority. We have a designated team specialized in the treatment of Heart Failure. This Care Team includes your primary Heart Failure Specialized Cardiologist (physician), Advanced Practice Providers (APPs- Physician Assistants and Nurse Practitioners), and Pharmacist who all work together to provide you with the care you need, when you need it.   You may see any of the following providers on your designated Care Team at your next follow up:  Dr. Arvilla Meres Dr. Marca Ancona Dr. Marcos Eke, NP Robbie Lis, [place] [facility] [place], [place] Brynda Peon,  NP Karle Plumber, PharmD   Please be sure to bring in all your medications bottles to every appointment.   Need to Contact us:  If you have any questions or concerns before your next appointment please send [place] a message through [place] or call our office at [phone].    TO LEAVE A MESSAGE FOR THE NURSE SELECT OPTION 2, PLEASE LEAVE A MESSAGE INCLUDING: YOUR NAME DATE OF BIRTH CALL BACK NUMBER REASON FOR CALL**this is important as we prioritize the call backs  YOU WILL RECEIVE A CALL BACK THE SAME DAY AS LONG AS YOU CALL BEFORE 4:00 PM

## 2023-03-03 ENCOUNTER — Telehealth: Payer: Self-pay | Admitting: *Deleted

## 2023-03-03 DIAGNOSIS — I10 Essential (primary) hypertension: Secondary | ICD-10-CM

## 2023-03-03 DIAGNOSIS — G4733 Obstructive sleep apnea (adult) (pediatric): Secondary | ICD-10-CM

## 2023-03-03 DIAGNOSIS — I2510 Atherosclerotic heart disease of native coronary artery without angina pectoris: Secondary | ICD-10-CM

## 2023-03-03 DIAGNOSIS — I5022 Chronic systolic (congestive) heart failure: Secondary | ICD-10-CM

## 2023-03-03 NOTE — Telephone Encounter (Signed)
Catherine Espinoza, Catherine Buckles, MD  Quintella Reichert, MD; Reesa Chew, CMA  Can you guys reach out to this patient again. She was unable to get her CPAP equipment due to insurance reasons. Thanks  Per Dr Mayford Knife, If unable to perform an in lab titration then initiate ResMed auto CPAP from 4 to 15cm H2O with heated humidity and mask of choice and overnight pulse ox on CPAP.       Reached out to [facility] spoke to [name] who states try to send the order in and see if the insurance will approve the APAP.  Upon patient request DME selection is [facility]. Patient understands he will be contacted by [facility] to set up his cpap. Patient understands to call if [facility] does not contact him with new setup in a timely manner. Patient understands they will be called once confirmation has been received from [facility] that they have received their new machine to schedule 10 week follow up appointment.   [facility] notified of new cpap order  Please add to airview Patient was grateful for the call and thanked me.

## 2023-03-04 ENCOUNTER — Other Ambulatory Visit (HOSPITAL_BASED_OUTPATIENT_CLINIC_OR_DEPARTMENT_OTHER): Payer: Self-pay

## 2023-03-09 ENCOUNTER — Encounter (HOSPITAL_COMMUNITY): Payer: Self-pay

## 2023-03-10 NOTE — Telephone Encounter (Signed)
Per 5/30 OV with Dr Gala Romney  - BP elevated here but says it has been better when she checks it elsewhere - Keep daily BP log. If SBP > 140 routinely add amlodipine (din't toelrate Bidil)   Will route to provider for dosage of amlodipine

## 2023-03-17 ENCOUNTER — Other Ambulatory Visit (HOSPITAL_BASED_OUTPATIENT_CLINIC_OR_DEPARTMENT_OTHER): Payer: Self-pay

## 2023-03-17 MED ORDER — AMLODIPINE BESYLATE 5 MG PO TABS
5.0000 mg | ORAL_TABLET | Freq: Every day | ORAL | 11 refills | Status: DC
  Filled 2023-03-17: qty 30, 30d supply, fill #0

## 2023-03-17 NOTE — Telephone Encounter (Signed)
Pt has not started norvasc  Norvasc 5 mg daily started -pt aware

## 2023-03-18 ENCOUNTER — Telehealth: Payer: Self-pay | Admitting: Family

## 2023-03-18 ENCOUNTER — Other Ambulatory Visit (HOSPITAL_BASED_OUTPATIENT_CLINIC_OR_DEPARTMENT_OTHER): Payer: Self-pay

## 2023-03-18 ENCOUNTER — Ambulatory Visit (INDEPENDENT_AMBULATORY_CARE_PROVIDER_SITE_OTHER): Payer: Commercial Managed Care - PPO | Admitting: Family

## 2023-03-18 ENCOUNTER — Other Ambulatory Visit (HOSPITAL_COMMUNITY): Payer: Self-pay | Admitting: Internal Medicine

## 2023-03-18 VITALS — BP 197/103 | HR 93 | Temp 99.0°F | Wt 272.0 lb

## 2023-03-18 DIAGNOSIS — E1129 Type 2 diabetes mellitus with other diabetic kidney complication: Secondary | ICD-10-CM | POA: Diagnosis not present

## 2023-03-18 DIAGNOSIS — Z7985 Long-term (current) use of injectable non-insulin antidiabetic drugs: Secondary | ICD-10-CM

## 2023-03-18 DIAGNOSIS — Z7984 Long term (current) use of oral hypoglycemic drugs: Secondary | ICD-10-CM

## 2023-03-18 DIAGNOSIS — R809 Proteinuria, unspecified: Secondary | ICD-10-CM | POA: Diagnosis not present

## 2023-03-18 DIAGNOSIS — I10 Essential (primary) hypertension: Secondary | ICD-10-CM | POA: Diagnosis not present

## 2023-03-18 DIAGNOSIS — L732 Hidradenitis suppurativa: Secondary | ICD-10-CM | POA: Diagnosis not present

## 2023-03-18 DIAGNOSIS — I159 Secondary hypertension, unspecified: Secondary | ICD-10-CM

## 2023-03-18 DIAGNOSIS — E785 Hyperlipidemia, unspecified: Secondary | ICD-10-CM | POA: Diagnosis not present

## 2023-03-18 DIAGNOSIS — E118 Type 2 diabetes mellitus with unspecified complications: Secondary | ICD-10-CM

## 2023-03-18 MED ORDER — CEPHALEXIN 500 MG PO CAPS
500.0000 mg | ORAL_CAPSULE | Freq: Three times a day (TID) | ORAL | 0 refills | Status: DC
  Filled 2023-03-18: qty 21, 7d supply, fill #0

## 2023-03-18 MED ORDER — CARVEDILOL 12.5 MG PO TABS
25.0000 mg | ORAL_TABLET | Freq: Two times a day (BID) | ORAL | 11 refills | Status: DC
  Filled 2023-03-18: qty 60, 15d supply, fill #0
  Filled 2023-05-06: qty 60, 15d supply, fill #1

## 2023-03-18 NOTE — Assessment & Plan Note (Signed)
Very uncontrolled.  Pt had not started amlodipine yet which was prescribed by cardiology. She obtained her rx downstairs during her visit and took first dose, then came back up for bp recheck which was still high but improved. She is advised to continue amlodipine and plan follow up in 1 week.

## 2023-03-18 NOTE — Progress Notes (Signed)
Subjective:     Patient ID: Catherine Espinoza, female    DOB: April 13, 1976, 48 y.o.   MRN: [id number]  Chief Complaint  Patient presents with   Recurrent Skin Infections    Complains of skin infections/ abscesses throughout     HPI  Discussed the use of AI scribe software for clinical note transcription with the patient, who gave verbal consent to proceed.  History of Present Illness   The patient, with a history of diabetes and hypertension, presents with persistent skin boils. She describes the boils as not painful, but they do not resolve and frequently drain pus. The patient is particularly bothered by the odor of the pus, which she describes as persistent and noticeable. She reports that the boils do not resolve with antibiotics. The patient also has a family history of similar skin conditions, suggesting a possible hereditary component.  The patient's diabetes is well-controlled, with a recent A1c of 6.7. She is currently on Jardiance and Ozempic for diabetes management. For hypertension, she is on amlodipine, carvedilol, Entresto, spironolactone, and Lasix.     Wt Readings from Last 3 Encounters:  03/18/23 272 lb (123.4 kg)  02/27/23 264 lb 9.6 oz (120 kg)  02/21/23 265 lb (120.2 kg)         Health Maintenance Due  Topic Date Due   Hepatitis C Screening  Never done   Diabetic kidney evaluation - Urine ACR  06/21/2017   OPHTHALMOLOGY EXAM  06/26/2017   Colonoscopy  Never done   COVID-19 Vaccine (3 - 2023-24 season) 05/31/2022    Past Medical History:  Diagnosis Date   Abscess of right hand 07/13/2021   Anemia, unspecified    CAD (coronary artery disease)    Chronic left-sided thoracic back pain    Excessive or frequent menstruation    Morbid obesity (HCC)    Obstructive sleep apnea 02/11/2011   Sleep study: severe OSA- rec CPAP 20cm small  full face mask   Proteinuria    Thoracic radiculopathy    Type II or unspecified type diabetes mellitus without mention of  complication, not stated as uncontrolled    Unspecified essential hypertension     Past Surgical History:  Procedure Laterality Date   ABDOMINAL HYSTERECTOMY     ABLATION COLPOCLESIS     CESAREAN SECTION     CLEFT PALATE REPAIR     CORONARY STENT INTERVENTION N/A 10/13/2020   Procedure: CORONARY STENT INTERVENTION;  Surgeon: Kathleene Hazel, MD;  Location: [facility];  Service: Cardiovascular;  Laterality: N/A;   cyst removal     ECTOPIC PREGNANCY SURGERY     x 2   I & D EXTREMITY Right 07/13/2021   Procedure: IRRIGATION AND DEBRIDEMENT OF HAND;  Surgeon: Marlyne Beards, MD;  Location: [facility] OR;  Service: Orthopedics;  Laterality: Right;   OOPHORECTOMY Right 2002   RIGHT/LEFT HEART CATH AND CORONARY ANGIOGRAPHY N/A 10/13/2020   Procedure: RIGHT/LEFT HEART CATH AND CORONARY ANGIOGRAPHY;  Surgeon: Dolores Patty, MD;  Location: [facility];  Service: Cardiovascular;  Laterality: N/A;    Family History  Problem Relation Age of Onset   Heart attack Maternal Aunt    Diabetes Mother    Cancer Father        oral cancer    Social History   Socioeconomic History   Marital status: Single    Spouse name: Not on file   Number of children: 1   Years of education: Not on file  Highest education level: Associate degree: occupational, Scientist, product/process development, or vocational program  Occupational History   Occupation: cna/med Theme park manager: [employer]  Tobacco Use   Smoking status: Every Day    Packs/day: 0.50    Years: 10.00    Additional pack years: 0.00    Total pack years: 5.00    Types: Cigarettes   Smokeless tobacco: Never  Vaping Use   Vaping Use: Never used  Substance and Sexual Activity   Alcohol use: Yes    Alcohol/week: 0.0 standard drinks of alcohol    Comment: occasional use   Drug use: No   Sexual activity: Yes    Birth control/protection: Surgical  Other Topics Concern   Not on file  Social History Narrative   Married-filing for divorced    Daughter born 2003   Works as cna/med [employer]   Social Determinants of Health   Financial Resource Strain: High Risk (10/12/2020)   Overall Financial Resource Strain (CARDIA)    Difficulty of Paying Living Expenses: Hard  Food Insecurity: No Food Insecurity (10/12/2020)   Hunger Vital Sign    Worried About Running Out of Food in the Last Year: Never true    Ran Out of Food in the Last Year: Never true  Transportation Needs: No Transportation Needs (10/12/2020)   PRAPARE - Administrator, Civil Service (Medical): No    Lack of Transportation (Non-Medical): No  Physical Activity: Not on file  Stress: Not on file  Social Connections: Not on file  Intimate Partner Violence: Not At Risk (10/12/2020)   Humiliation, Afraid, Rape, and Kick questionnaire    Fear of Current or Ex-Partner: No    Emotionally Abused: No    Physically Abused: No    Sexually Abused: No    Outpatient Medications Prior to Visit  Medication Sig Dispense Refill   acetaminophen (TYLENOL) 500 MG tablet Take 1,000 mg by mouth every 6 (six) hours as needed for mild pain or moderate pain.     amLODipine (NORVASC) 5 MG tablet Take 1 tablet (5 mg total) by mouth daily. 30 tablet 11   blood glucose meter kit and supplies KIT Dispense based on patient and insurance preference. Use up to four times daily as directed. (FOR ICD-9 250.00, 250.01). 1 each 0   carvedilol (COREG) 12.5 MG tablet Take 2 tablets (25 mg total) by mouth 2 (two) times daily with a meal. 60 tablet 11   Continuous Glucose Sensor (FREESTYLE LIBRE 2 SENSOR) MISC USE AS DIRECTED 6 each 3   empagliflozin (JARDIANCE) 25 MG TABS tablet Take 1 tablet (25 mg total) by mouth daily before breakfast. 90 tablet 3   furosemide (LASIX) 40 MG tablet Take 1 tablet (40 mg total) by mouth daily. 90 tablet 3   Insulin Pen Needle 32G X 6 MM MISC Use as directed once daily in the afternoon 100 each 3   Insulin Syringe 27G X 1/2" 0.5 ML MISC Use as directed 100 each 3    rosuvastatin (CRESTOR) 40 MG tablet TAKE 1 TABLET (40 MG TOTAL) BY MOUTH DAILY. 90 tablet 3   sacubitril-valsartan (ENTRESTO) 97-103 MG Take 1 tablet by mouth 2 (two) times daily. NEEDS APPOINTMENT FOR ANY MORE REFILLS 90 tablet 0   Semaglutide, 1 MG/DOSE, 4 MG/3ML SOPN Inject 1 mg as directed once a week. 9 mL 3   spironolactone (ALDACTONE) 25 MG tablet TAKE 1 TABLET (25 MG TOTAL) BY MOUTH DAILY. 90 tablet 3   No facility-administered medications  prior to visit.    Allergies  Allergen Reactions   Ibuprofen     REACTION: knots in mouth with SOB   Labetalol Nausea Only   Aspirin Other (See Comments)    Knots in mouth/ Higher doses     ROS See HPI    Objective:    Physical Exam Constitutional:      Appearance: Normal appearance.  Skin:    Comments:  Multiple small boils present on both breasts, one with active drainage upon manipulation. No fluctuance. Foul odor noted from draining lesions.  Neurological:     General: No focal deficit present.     Mental Status: She is alert and oriented to person, place, and time.  Psychiatric:        Mood and Affect: Mood normal.        Behavior: Behavior normal.        Thought Content: Thought content normal.        Judgment: Judgment normal.      BP (!) 197/103 (BP Location: Left Arm, Patient Position: Sitting, Cuff Size: Large)   Pulse 93   Temp 99 F (37.2 C) (Oral)   Wt 272 lb (123.4 kg)   LMP 09/13/2011   SpO2 98%   BMI 53.03 kg/m  Wt Readings from Last 3 Encounters:  03/18/23 272 lb (123.4 kg)  02/27/23 264 lb 9.6 oz (120 kg)  02/21/23 265 lb (120.2 kg)       Assessment & Plan:   Problem List Items Addressed This Visit       Unprioritized   Hypertension    Very uncontrolled.  Pt had not started amlodipine yet which was prescribed by cardiology. She obtained her rx downstairs during her visit and took first dose, then came back up for bp recheck which was still high but improved. She is advised to continue  amlodipine and plan follow up in 1 week.       Relevant Orders   Comp Met (CMET)   Hyperlipidemia   Relevant Orders   Lipid panel   HTN (hypertension)   Relevant Orders   Comp Met (CMET)   Hidradenitis suppurativa - Primary   Relevant Medications   cephALEXin (KEFLEX) 500 MG capsule   Other Relevant Orders   Ambulatory referral to Dermatology   Controlled diabetes mellitus type 2 with complications (HCC)    A1C is at goal. Well-controlled with last A1c at 6.7 on 02/21/2023. Currently on Jardiance and Ozempic. -management per endo       I am having Allyanna Frisk start on cephALEXin. I am also having her maintain her Insulin Syringe, blood glucose meter kit and supplies, acetaminophen, Insulin Pen Needle, rosuvastatin, spironolactone, furosemide, Entresto, FreeStyle Libre 2 Sensor, Semaglutide (1 MG/DOSE), empagliflozin, amLODipine, and carvedilol.  Meds ordered this encounter  Medications   cephALEXin (KEFLEX) 500 MG capsule    Sig: Take 1 capsule (500 mg total) by mouth 3 (three) times daily.    Dispense:  21 capsule    Refill:  0    Order Specific Question:   Supervising Provider    Answer:   Danise Edge A [[id number]]

## 2023-03-18 NOTE — Telephone Encounter (Signed)
Please advise pt I sent an antibiotic downstairs for her to start.

## 2023-03-18 NOTE — Assessment & Plan Note (Signed)
A1C is at goal. Well-controlled with last A1c at 6.7 on 02/21/2023. Currently on Jardiance and Ozempic. -management per endo

## 2023-03-19 ENCOUNTER — Other Ambulatory Visit (HOSPITAL_BASED_OUTPATIENT_CLINIC_OR_DEPARTMENT_OTHER): Payer: Self-pay

## 2023-03-19 LAB — LIPID PANEL
Cholesterol: 227 mg/dL — ABNORMAL HIGH (ref 0–200)
HDL: 43.7 mg/dL (ref >39.00)
LDL Cholesterol: 156 mg/dL — ABNORMAL HIGH (ref 0–99)
NonHDL: 183.32
Total CHOL/HDL Ratio: 5
Triglycerides: 137 mg/dL (ref 0.0–149.0)
VLDL: 27.4 mg/dL (ref 0.0–40.0)

## 2023-03-19 LAB — COMPREHENSIVE METABOLIC PANEL
ALT: 12 U/L (ref 0–35)
AST: 9 U/L (ref 0–37)
Albumin: 3.1 g/dL — ABNORMAL LOW (ref 3.5–5.2)
Alkaline Phosphatase: 68 U/L (ref 39–117)
BUN: 16 mg/dL (ref 6–23)
CO2: 27 mEq/L (ref 19–32)
Calcium: 8.7 mg/dL (ref 8.4–10.5)
Chloride: 106 mEq/L (ref 96–112)
Creatinine, Ser: 0.66 mg/dL (ref 0.40–1.20)
GFR: 104.66 mL/min (ref >60.00)
Glucose, Bld: 92 mg/dL (ref 70–99)
Potassium: 4 mEq/L (ref 3.5–5.1)
Sodium: 139 mEq/L (ref 135–145)
Total Bilirubin: 0.2 mg/dL (ref 0.2–1.2)
Total Protein: 6.5 g/dL (ref 6.0–8.3)

## 2023-03-19 LAB — MICROALBUMIN / CREATININE URINE RATIO
Creatinine,U: 137.7 mg/dL
Microalb Creat Ratio: 176.9 mg/g — ABNORMAL HIGH (ref 0.0–30.0)
Microalb, Ur: 243.6 mg/dL — ABNORMAL HIGH (ref 0.0–1.9)

## 2023-03-19 NOTE — Telephone Encounter (Signed)
Patient notified that antibiotic was sent in.

## 2023-03-22 ENCOUNTER — Telehealth: Payer: Self-pay | Admitting: Family

## 2023-03-22 DIAGNOSIS — R809 Proteinuria, unspecified: Secondary | ICD-10-CM

## 2023-03-22 NOTE — Telephone Encounter (Addendum)
Lab work shows that the protein level in her blood is low likely because her kidneys are excreting the protein into her urine.  I would like for her to see a kidney specialist and in the meantime complete a 24 hour urine to see how much protein she is losing through her urine each day.   Also, her cholesterol is elevated. Has she missed any doses of crestor?

## 2023-03-24 NOTE — Telephone Encounter (Signed)
Patient notified of results and she will be here to [facility], she will get the 24 hr urine test container and instructions then. She will also discuss cholesterol levels with provider tomorrow, she reports she is taking crestor daily as prescribed.

## 2023-03-25 ENCOUNTER — Ambulatory Visit (INDEPENDENT_AMBULATORY_CARE_PROVIDER_SITE_OTHER): Payer: Commercial Managed Care - PPO | Admitting: Family

## 2023-03-25 ENCOUNTER — Other Ambulatory Visit (HOSPITAL_BASED_OUTPATIENT_CLINIC_OR_DEPARTMENT_OTHER): Payer: Self-pay

## 2023-03-25 VITALS — BP 175/90 | HR 92 | Temp 98.5°F | Wt 272.0 lb

## 2023-03-25 DIAGNOSIS — I10 Essential (primary) hypertension: Secondary | ICD-10-CM | POA: Diagnosis not present

## 2023-03-25 DIAGNOSIS — G4733 Obstructive sleep apnea (adult) (pediatric): Secondary | ICD-10-CM | POA: Diagnosis not present

## 2023-03-25 DIAGNOSIS — R809 Proteinuria, unspecified: Secondary | ICD-10-CM | POA: Diagnosis not present

## 2023-03-25 DIAGNOSIS — L732 Hidradenitis suppurativa: Secondary | ICD-10-CM

## 2023-03-25 DIAGNOSIS — F17200 Nicotine dependence, unspecified, uncomplicated: Secondary | ICD-10-CM

## 2023-03-25 MED ORDER — AMLODIPINE BESYLATE 10 MG PO TABS
10.0000 mg | ORAL_TABLET | Freq: Every day | ORAL | 0 refills | Status: DC
  Filled 2023-03-25 (×3): qty 90, 90d supply, fill #0

## 2023-03-25 NOTE — Progress Notes (Unsigned)
Subjective:     Patient ID: Catherine Espinoza, female    DOB: [birth year], 47 y.o.   MRN: [id number]  Chief Complaint  Patient presents with   Hypertension    Here for follow up    HPI  Discussed the use of AI scribe software for clinical note transcription with the patient, who gave verbal consent to proceed.  History of Present Illness   The patient, with a history of hypertension and sleep apnea, presents for follow-up. She reports that she has not yet received the prescribed antibiotic for her boils. She is currently on amlodipine for hypertension, which has improved her blood pressure from 197 to 175, but it remains above the target of 140. She denies any issues with the amlodipine, including swelling in the feet. She also reports that she has resumed using her CPAP machine for sleep apnea, and her scores have improved from 2.4 to 0.6 apneas per hour since she started CPAP. She is scheduled for an overnight oxygen study to assess for any oxygen needs.   In addition, she has been referred to dermatology for her boils, and she has an appointment scheduled for July. She also reports that she has been referred to a nephrologist due to proteinuria, but she has not yet heard back from the office. She expresses concern about her cholesterol levels and the proteinuria, fearing that it may be indicative of kidney disease. She also mentions that she continues to smoke.           Health Maintenance Due  Topic Date Due   Hepatitis C Screening  Never done   OPHTHALMOLOGY EXAM  06/26/2017   Colonoscopy  Never done   COVID-19 Vaccine (3 - 2023-24 season) 05/31/2022    Past Medical History:  Diagnosis Date   Abscess of right hand 07/13/2021   Anemia, unspecified    CAD (coronary artery disease)    Chronic left-sided thoracic back pain    Excessive or frequent menstruation    Morbid obesity (HCC)    Obstructive sleep apnea 02/11/2011   Sleep study: severe OSA- rec CPAP 20cm small  full  face mask   Proteinuria    Thoracic radiculopathy    Type II or unspecified type diabetes mellitus without mention of complication, not stated as uncontrolled    Unspecified essential hypertension     Past Surgical History:  Procedure Laterality Date   ABDOMINAL HYSTERECTOMY     ABLATION COLPOCLESIS     CESAREAN SECTION     CLEFT PALATE REPAIR     CORONARY STENT INTERVENTION N/A 10/13/2020   Procedure: CORONARY STENT INTERVENTION;  Surgeon: Kathleene Hazel, MD;  Location: [facility];  Service: Cardiovascular;  Laterality: N/A;   cyst removal     ECTOPIC PREGNANCY SURGERY     x 2   I & D EXTREMITY Right 07/13/2021   Procedure: IRRIGATION AND DEBRIDEMENT OF HAND;  Surgeon: Marlyne Beards, MD;  Location: [facility] OR;  Service: Orthopedics;  Laterality: Right;   OOPHORECTOMY Right 2002   RIGHT/LEFT HEART CATH AND CORONARY ANGIOGRAPHY N/A 10/13/2020   Procedure: RIGHT/LEFT HEART CATH AND CORONARY ANGIOGRAPHY;  Surgeon: Dolores Patty, MD;  Location: [facility];  Service: Cardiovascular;  Laterality: N/A;    Family History  Problem Relation Age of Onset   Heart attack Maternal Aunt    Diabetes Mother    Cancer Father        oral cancer    Social History   Socioeconomic  History   Marital status: Single    Spouse name: Not on file   Number of children: 1   Years of education: Not on file   Highest education level: Associate degree: occupational, Scientist, product/process development, or vocational program  Occupational History   Occupation: cna/med Theme park manager: [employer]  Tobacco Use   Smoking status: Every Day    Packs/day: 0.50    Years: 10.00    Additional pack years: 0.00    Total pack years: 5.00    Types: Cigarettes   Smokeless tobacco: Never  Vaping Use   Vaping Use: Never used  Substance and Sexual Activity   Alcohol use: Yes    Alcohol/week: 0.0 standard drinks of alcohol    Comment: occasional use   Drug use: No   Sexual activity: Yes    Birth  control/protection: Surgical  Other Topics Concern   Not on file  Social History Narrative   Married-filing for divorced   Daughter born 2003   Works as cna/med [employer]   Social Determinants of Health   Financial Resource Strain: High Risk (10/12/2020)   Overall Financial Resource Strain (CARDIA)    Difficulty of Paying Living Expenses: Hard  Food Insecurity: No Food Insecurity (10/12/2020)   Hunger Vital Sign    Worried About Running Out of Food in the Last Year: Never true    Ran Out of Food in the Last Year: Never true  Transportation Needs: No Transportation Needs (10/12/2020)   PRAPARE - Administrator, Civil Service (Medical): No    Lack of Transportation (Non-Medical): No  Physical Activity: Not on file  Stress: Not on file  Social Connections: Not on file  Intimate Partner Violence: Not At Risk (10/12/2020)   Humiliation, Afraid, Rape, and Kick questionnaire    Fear of Current or Ex-Partner: No    Emotionally Abused: No    Physically Abused: No    Sexually Abused: No    Outpatient Medications Prior to Visit  Medication Sig Dispense Refill   acetaminophen (TYLENOL) 500 MG tablet Take 1,000 mg by mouth every 6 (six) hours as needed for mild pain or moderate pain.     blood glucose meter kit and supplies KIT Dispense based on patient and insurance preference. Use up to four times daily as directed. (FOR ICD-9 250.00, 250.01). 1 each 0   carvedilol (COREG) 12.5 MG tablet Take 2 tablets (25 mg total) by mouth 2 (two) times daily with a meal. 60 tablet 11   cephALEXin (KEFLEX) 500 MG capsule Take 1 capsule (500 mg total) by mouth 3 (three) times daily. 21 capsule 0   Continuous Glucose Sensor (FREESTYLE LIBRE 2 SENSOR) MISC USE AS DIRECTED 6 each 3   empagliflozin (JARDIANCE) 25 MG TABS tablet Take 1 tablet (25 mg total) by mouth daily before breakfast. 90 tablet 3   furosemide (LASIX) 40 MG tablet Take 1 tablet (40 mg total) by mouth daily. 90 tablet 3   Insulin Pen  Needle 32G X 6 MM MISC Use as directed once daily in the afternoon 100 each 3   Insulin Syringe 27G X 1/2" 0.5 ML MISC Use as directed 100 each 3   rosuvastatin (CRESTOR) 40 MG tablet TAKE 1 TABLET (40 MG TOTAL) BY MOUTH DAILY. 90 tablet 3   sacubitril-valsartan (ENTRESTO) 97-103 MG Take 1 tablet by mouth 2 (two) times daily. NEEDS APPOINTMENT FOR ANY MORE REFILLS 90 tablet 0   Semaglutide, 1 MG/DOSE, 4 MG/3ML SOPN Inject 1  mg as directed once a week. 9 mL 3   spironolactone (ALDACTONE) 25 MG tablet TAKE 1 TABLET (25 MG TOTAL) BY MOUTH DAILY. 90 tablet 3   amLODipine (NORVASC) 5 MG tablet Take 1 tablet (5 mg total) by mouth daily. 30 tablet 11   No facility-administered medications prior to visit.    Allergies  Allergen Reactions   Ibuprofen     REACTION: knots in mouth with SOB   Labetalol Nausea Only   Aspirin Other (See Comments)    Knots in mouth/ Higher doses     ROS See HPI    Objective:    Physical Exam Constitutional:      General: She is not in acute distress.    Appearance: Normal appearance. She is well-developed.  HENT:     Head: Normocephalic and atraumatic.     Right Ear: External ear normal.     Left Ear: External ear normal.  Eyes:     General: No scleral icterus. Neck:     Thyroid: No thyromegaly.  Cardiovascular:     Rate and Rhythm: Normal rate and regular rhythm.     Heart sounds: Normal heart sounds. No murmur heard. Pulmonary:     Effort: Pulmonary effort is normal. No respiratory distress.     Breath sounds: Normal breath sounds. No wheezing.  Musculoskeletal:     Cervical back: Neck supple.  Skin:    General: Skin is warm and dry.  Neurological:     Mental Status: She is alert and oriented to person, place, and time.  Psychiatric:        Mood and Affect: Mood normal.        Behavior: Behavior normal.        Thought Content: Thought content normal.        Judgment: Judgment normal.      BP (!) 175/90 (BP Location: Left Arm, Patient  Position: Sitting, Cuff Size: Large)   Pulse 92   Temp 98.5 F (36.9 C) (Oral)   Wt 272 lb (123.4 kg)   LMP 09/13/2011   SpO2 97%   BMI 53.03 kg/m  Wt Readings from Last 3 Encounters:  03/25/23 272 lb (123.4 kg)  03/18/23 272 lb (123.4 kg)  02/27/23 264 lb 9.6 oz (120 kg)       Assessment & Plan:   Problem List Items Addressed This Visit       Unprioritized   TOBACCO ABUSE     Smoking: Patient continues to smoke. -Encourage smoking cessation for overall health improvement.      Proteinuria    Kidney function is good. Possible causes include diabetes, hypertension, or other kidney issues. -Referral to nephrology at [facility]. -Collect 24-hour urine sample for further evaluation.       Obstructive sleep apnea    atient is using CPAP and improving scores. Cardiologist has requested overnight oxygen study. -Continue CPAP use. -Complete overnight oxygen study as directed by cardiologist.       Hypertension - Primary      Blood pressure improved from 197 to 175 on Amlodipine, but still above target of <140. No reported side effects. -Increase Amlodipine from 5mg  to 10mg  daily. -Check blood pressure in two weeks.      Relevant Medications   amLODipine (NORVASC) 10 MG tablet   Hidradenitis suppurativa    Reports boils are unchanged since last visit.  Patient has not yet started antibiotics. -Ensure prescription is ready for patient to pick up and begin. Keep  dermatology appointment as scheduled        I have discontinued Catherine Espinoza's amLODipine. I am also having her start on amLODipine. Additionally, I am having her maintain her Insulin Syringe, blood glucose meter kit and supplies, acetaminophen, Insulin Pen Needle, rosuvastatin, spironolactone, furosemide, Entresto, FreeStyle Libre 2 Sensor, Semaglutide (1 MG/DOSE), empagliflozin, carvedilol, and cephALEXin.  Meds ordered this encounter  Medications   amLODipine (NORVASC) 10 MG tablet     Sig: Take 1 tablet (10 mg total) by mouth daily.    Dispense:  90 tablet    Refill:  0    Order Specific Question:   Supervising Provider    Answer:   Danise Edge A [[id number]]

## 2023-03-26 ENCOUNTER — Other Ambulatory Visit (HOSPITAL_BASED_OUTPATIENT_CLINIC_OR_DEPARTMENT_OTHER): Payer: Self-pay

## 2023-03-26 NOTE — Assessment & Plan Note (Addendum)
   Blood pressure improved from 197 to 175 on Amlodipine, but still above target of <140. No reported side effects. -Increase Amlodipine from 5mg  to 10mg  daily. -Check blood pressure in two weeks.

## 2023-03-26 NOTE — Assessment & Plan Note (Signed)
Blood pressure improved from 197 to 175 on Amlodipine, but still above target of <140. No reported side effects. -Increase Amlodipine from 5mg  to 10mg  daily. -Check blood pressure in two weeks.

## 2023-03-26 NOTE — Assessment & Plan Note (Signed)
Reports boils are unchanged since last visit.  Patient has not yet started antibiotics. -Ensure prescription is ready for patient to pick up and begin. Keep dermatology appointment as scheduled

## 2023-03-26 NOTE — Assessment & Plan Note (Signed)
atient is using CPAP and improving scores. Cardiologist has requested overnight oxygen study. -Continue CPAP use. -Complete overnight oxygen study as directed by cardiologist.

## 2023-03-26 NOTE — Assessment & Plan Note (Signed)
Kidney function is good. Possible causes include diabetes, hypertension, or other kidney issues. -Referral to nephrology at [facility]. -Collect 24-hour urine sample for further evaluation.

## 2023-03-26 NOTE — Assessment & Plan Note (Signed)
  Smoking: Patient continues to smoke. -Encourage smoking cessation for overall health improvement.

## 2023-03-27 ENCOUNTER — Other Ambulatory Visit (HOSPITAL_BASED_OUTPATIENT_CLINIC_OR_DEPARTMENT_OTHER): Payer: Self-pay

## 2023-03-27 ENCOUNTER — Other Ambulatory Visit: Payer: Commercial Managed Care - PPO

## 2023-03-27 DIAGNOSIS — R809 Proteinuria, unspecified: Secondary | ICD-10-CM

## 2023-03-27 NOTE — Progress Notes (Signed)
Pt returning 24 hr urine.  Start date:03/26/23 @ 3pm End date: 03/27/23 @ 3pm. Totals Vol:  1150 mL

## 2023-03-28 LAB — PROTEIN, URINE, 24 HOUR: Protein, 24H Urine: 2346 mg/24 h — ABNORMAL HIGH (ref 0–149)

## 2023-04-02 ENCOUNTER — Emergency Department (HOSPITAL_BASED_OUTPATIENT_CLINIC_OR_DEPARTMENT_OTHER)
Admission: EM | Admit: 2023-04-02 | Discharge: 2023-04-02 | Disposition: A | Discharge status: Home / Self Care | Payer: Commercial Managed Care - PPO | Attending: Emergency Medicine | Admitting: Emergency Medicine

## 2023-04-02 ENCOUNTER — Emergency Department (HOSPITAL_BASED_OUTPATIENT_CLINIC_OR_DEPARTMENT_OTHER): Payer: Commercial Managed Care - PPO

## 2023-04-02 ENCOUNTER — Other Ambulatory Visit (HOSPITAL_BASED_OUTPATIENT_CLINIC_OR_DEPARTMENT_OTHER): Payer: Self-pay

## 2023-04-02 ENCOUNTER — Other Ambulatory Visit: Payer: Self-pay

## 2023-04-02 DIAGNOSIS — M79631 Pain in right forearm: Secondary | ICD-10-CM | POA: Insufficient documentation

## 2023-04-02 DIAGNOSIS — I10 Essential (primary) hypertension: Secondary | ICD-10-CM | POA: Insufficient documentation

## 2023-04-02 DIAGNOSIS — L089 Local infection of the skin and subcutaneous tissue, unspecified: Secondary | ICD-10-CM

## 2023-04-02 DIAGNOSIS — I2510 Atherosclerotic heart disease of native coronary artery without angina pectoris: Secondary | ICD-10-CM | POA: Insufficient documentation

## 2023-04-02 DIAGNOSIS — E119 Type 2 diabetes mellitus without complications: Secondary | ICD-10-CM | POA: Insufficient documentation

## 2023-04-02 DIAGNOSIS — R2231 Localized swelling, mass and lump, right upper limb: Secondary | ICD-10-CM | POA: Diagnosis not present

## 2023-04-02 DIAGNOSIS — Z79899 Other long term (current) drug therapy: Secondary | ICD-10-CM | POA: Insufficient documentation

## 2023-04-02 DIAGNOSIS — Z794 Long term (current) use of insulin: Secondary | ICD-10-CM | POA: Insufficient documentation

## 2023-04-02 DIAGNOSIS — W540XXA Bitten by dog, initial encounter: Secondary | ICD-10-CM | POA: Diagnosis not present

## 2023-04-02 LAB — CBC WITH DIFFERENTIAL/PLATELET
Abs Immature Granulocytes: 0.02 10*3/uL (ref 0.00–0.07)
Basophils Absolute: 0 10*3/uL (ref 0.0–0.1)
Basophils Relative: 1 %
Eosinophils Absolute: 0.1 10*3/uL (ref 0.0–0.5)
Eosinophils Relative: 1 %
HCT: 41.2 % (ref 36.0–46.0)
Hemoglobin: 13.5 g/dL (ref 12.0–15.0)
Immature Granulocytes: 0 %
Lymphocytes Relative: 28 %
Lymphs Abs: 2.3 10*3/uL (ref 0.7–4.0)
MCH: 29.3 pg (ref 26.0–34.0)
MCHC: 32.8 g/dL (ref 30.0–36.0)
MCV: 89.6 fL (ref 80.0–100.0)
Monocytes Absolute: 0.6 10*3/uL (ref 0.1–1.0)
Monocytes Relative: 7 %
Neutro Abs: 5.1 10*3/uL (ref 1.7–7.7)
Neutrophils Relative %: 63 %
Platelets: 247 10*3/uL (ref 150–400)
RBC: 4.6 MIL/uL (ref 3.87–5.11)
RDW: 13.6 % (ref 11.5–15.5)
WBC: 8.2 10*3/uL (ref 4.0–10.5)
nRBC: 0 % (ref 0.0–0.2)

## 2023-04-02 LAB — BASIC METABOLIC PANEL
Anion gap: 6 (ref 5–15)
BUN: 14 mg/dL (ref 6–20)
CO2: 24 mmol/L (ref 22–32)
Calcium: 8.9 mg/dL (ref 8.9–10.3)
Chloride: 106 mmol/L (ref 98–111)
Creatinine, Ser: 0.74 mg/dL (ref 0.44–1.00)
GFR, Estimated: >60 mL/min (ref >60)
Glucose, Bld: 114 mg/dL — ABNORMAL HIGH (ref 70–99)
Potassium: 3.9 mmol/L (ref 3.5–5.1)
Sodium: 136 mmol/L (ref 135–145)

## 2023-04-02 MED ORDER — FLUCONAZOLE 150 MG PO TABS
150.0000 mg | ORAL_TABLET | Freq: Every day | ORAL | 1 refills | Status: DC
  Filled 2023-04-02: qty 1, 1d supply, fill #0

## 2023-04-02 MED ORDER — AMOXICILLIN-POT CLAVULANATE 875-125 MG PO TABS
1.0000 | ORAL_TABLET | Freq: Two times a day (BID) | ORAL | 0 refills | Status: DC
  Filled 2023-04-02: qty 14, 7d supply, fill #0

## 2023-04-02 NOTE — ED Triage Notes (Signed)
Patient presents to ED via POV from home. Here with right forearm pain. Dog bite 2.5 weeks ago. Dog updated on rabies vaccine. 2 puncture wounds noted. Edema and redness noted. Patient reports fall 1 week ago. Where patient attempted to catch herself. Denies LOC, hitting head or being on blood thinners.

## 2023-04-02 NOTE — Discharge Instructions (Addendum)
Contact a health care provider if: You have more redness, swelling, or pain around your wound. Your wound feels warm to the touch. You have a fever or chills. You have a general feeling of sickness (malaise). You feel nauseous or you vomit. You have pain that does not get better. Get help right away if: You have a red streak that leads away from your wound. You have non-clear fluid or more blood coming from your wound. There is pus or a bad smell coming from your wound. You have trouble moving your injured area. You have numbness or tingling that spreads beyond your wound. 

## 2023-04-02 NOTE — ED Provider Notes (Signed)
[place] [facility] AT [facility] Provider Note   CSN: [id number] Arrival date & time: 04/02/23  4098     History  Chief Complaint  Patient presents with   Arm Pain    Catherine Espinoza is a 47 y.o. female who presents emergency department with right forearm pain.  Patient states that she was bitten by her dog 2 and half weeks ago.  The dog is a mini pitbull.  She was treating it with Neosporin.  She fell last week and hit her forearm on the ground.  Yesterday she began having increased pain swelling and redness in the forearm.  She is unsure if this has to do with her previous fall or with the dog bite.  She has not had any fever or chills.  She has comorbidities that include obesity, hypertension, diabetes and CAD.  She denies any numbness or tingling in the forearm or hand  The history is provided by the patient.  Arm Pain       Home Medications Prior to Admission medications   Medication Sig Start Date End Date Taking? Authorizing Provider  acetaminophen (TYLENOL) 500 MG tablet Take 1,000 mg by mouth every 6 (six) hours as needed for mild pain or moderate pain.    [provider]  amLODipine (NORVASC) 10 MG tablet Take 1 tablet (10 mg total) by mouth daily. 03/25/23   Sandford Craze, NP  blood glucose meter kit and supplies KIT Dispense based on patient and insurance preference. Use up to four times daily as directed. (FOR ICD-9 250.00, 250.01). 10/03/20   Sandford Craze, NP  carvedilol (COREG) 12.5 MG tablet Take 2 tablets (25 mg total) by mouth 2 (two) times daily with a meal. 03/18/23 03/17/24  Bensimhon, Bevelyn Buckles, MD  cephALEXin (KEFLEX) 500 MG capsule Take 1 capsule (500 mg total) by mouth 3 (three) times daily. 03/18/23   Sandford Craze, NP  Continuous Glucose Sensor (FREESTYLE LIBRE 2 SENSOR) MISC USE AS DIRECTED 02/05/23   Shamleffer, Konrad Dolores, MD  empagliflozin (JARDIANCE) 25 MG TABS tablet Take 1 tablet (25 mg total) by mouth  daily before breakfast. 02/21/23   Shamleffer, Konrad Dolores, MD  furosemide (LASIX) 40 MG tablet Take 1 tablet (40 mg total) by mouth daily. 03/07/22   Milford, Anderson Malta, FNP  Insulin Pen Needle 32G X 6 MM MISC Use as directed once daily in the afternoon 12/04/21   Shamleffer, Konrad Dolores, MD  Insulin Syringe 27G X 1/2" 0.5 ML MISC Use as directed 03/16/20   Sandford Craze, NP  rosuvastatin (CRESTOR) 40 MG tablet TAKE 1 TABLET (40 MG TOTAL) BY MOUTH DAILY. 03/07/22   Bensimhon, Bevelyn Buckles, MD  sacubitril-valsartan (ENTRESTO) 97-103 MG Take 1 tablet by mouth 2 (two) times daily. NEEDS APPOINTMENT FOR ANY MORE REFILLS 09/26/22 09/26/23  Jacklynn Ganong, FNP  Semaglutide, 1 MG/DOSE, 4 MG/3ML SOPN Inject 1 mg as directed once a week. 02/21/23   Shamleffer, Konrad Dolores, MD  spironolactone (ALDACTONE) 25 MG tablet TAKE 1 TABLET (25 MG TOTAL) BY MOUTH DAILY. 03/07/22   Bensimhon, Bevelyn Buckles, MD      Allergies    Ibuprofen, Labetalol, and Aspirin    Review of Systems   Review of Systems  Physical Exam Updated Vital Signs BP (!) 193/110   Pulse (!) 101   Temp 98 F (36.7 C) (Oral)   Resp 17   Ht 5\' 5"  (1.651 m)   Wt 117.9 kg   LMP 09/13/2011   SpO2 98%  BMI 43.27 kg/m  Physical Exam Vitals and nursing note reviewed.  Constitutional:      General: She is not in acute distress.    Appearance: She is well-developed. She is not diaphoretic.  HENT:     Head: Normocephalic and atraumatic.     Right Ear: External ear normal.     Left Ear: External ear normal.     Nose: Nose normal.     Mouth/Throat:     Mouth: Mucous membranes are moist.  Eyes:     General: No scleral icterus.    Conjunctiva/sclera: Conjunctivae normal.  Cardiovascular:     Rate and Rhythm: Normal rate and regular rhythm.     Heart sounds: Normal heart sounds. No murmur heard.    No friction rub. No gallop.  Pulmonary:     Effort: Pulmonary effort is normal. No respiratory distress.     Breath sounds: Normal  breath sounds.  Abdominal:     General: Bowel sounds are normal. There is no distension.     Palpations: Abdomen is soft. There is no mass.     Tenderness: There is no abdominal tenderness. There is no guarding.  Musculoskeletal:     Cervical back: Normal range of motion.     Comments: Normal musculoskeletal right wrist elbow and shoulder examination. Cap refill less than 2 seconds, radial pulse 2+. The right forearm shows 2 puncture wounds on the forearm.  No active purulence.  There is surrounding swelling and erythema on the dorsal and ulnar side of the forearm as compared to the left forearm.  It is tender to palpation and warm to the touch.  No fluctuance or induration.   Skin:    General: Skin is warm and dry.  Neurological:     Mental Status: She is alert and oriented to person, place, and time.  Psychiatric:        Behavior: Behavior normal.     ED Results / Procedures / Treatments   Labs (all labs ordered are listed, but only abnormal results are displayed) Labs Reviewed - No data to display  EKG None  Radiology No results found.  Procedures Procedures    Medications Ordered in ED Medications - No data to display  ED Course/ Medical Decision Making/ A&P Clinical Course as of 04/02/23 1026  Wed Apr 02, 2023  4098 Patient here with right forearm pain.  Differential diagnosis includes hematoma, more likely cellulitis secondary to dog bite.  She has not been taking any antibiotics.  She does not have evidence of compartment syndrome.  She did have an injury to the forearm and will obtain imaging to rule out underlying fracture although this is much less likely on my differential diagnosis.  She is neurovascularly intact.  Initial plan is to get basic labs and x-ray.  Her blood pressure is notably elevated and heart rate of 101. [AH]  1024 DG Forearm Right [AH]  1024 Basic metabolic panel(!) [AH]  1025 Glucose(!): 114 [AH]  1025 CBC with Differential Labs reassuring  [AH]  1025 DG Forearm Right I personally visualized and interpreted the images using our PACS system. Acute findings include:  No acute findings   [AH]    Clinical Course User Index [AH] Arthor Captain, PA-C                             Medical Decision Making Patient with left forearm pain- appears to be likely infection. Plan tx  with augmentin. Return precautions. Tylenol, ice.   Amount and/or Complexity of Data Reviewed Labs: ordered. Decision-making details documented in ED Course. Radiology: ordered and independent interpretation performed. Decision-making details documented in ED Course.  Risk Risk Details: DM.   Patient notably hypertensive.  Addressed this with the patient.  She states that she is recently had an increase in her Norvasc dose and carvedilol.  She is working on her blood pressure diligently in the outpatient setting with both her cardiologist and primary care physician.  She is otherwise asymptomatic.        Final Clinical Impression(s) / ED Diagnoses Final diagnoses:  None    Rx / DC Orders ED Discharge Orders     None         Arthor Captain, PA-C 04/02/23 1037    Curatolo, Adam, DO 04/02/23 1039

## 2023-04-10 ENCOUNTER — Other Ambulatory Visit (HOSPITAL_BASED_OUTPATIENT_CLINIC_OR_DEPARTMENT_OTHER): Payer: Self-pay

## 2023-04-10 ENCOUNTER — Encounter: Payer: Self-pay | Admitting: Dermatology

## 2023-04-10 ENCOUNTER — Ambulatory Visit (INDEPENDENT_AMBULATORY_CARE_PROVIDER_SITE_OTHER): Payer: Commercial Managed Care - PPO | Admitting: Dermatology

## 2023-04-10 VITALS — BP 134/90

## 2023-04-10 DIAGNOSIS — L249 Irritant contact dermatitis, unspecified cause: Secondary | ICD-10-CM | POA: Diagnosis not present

## 2023-04-10 DIAGNOSIS — L309 Dermatitis, unspecified: Secondary | ICD-10-CM

## 2023-04-10 DIAGNOSIS — L732 Hidradenitis suppurativa: Secondary | ICD-10-CM | POA: Diagnosis not present

## 2023-04-10 MED ORDER — CLINDAMYCIN PHOSPHATE 1 % EX LOTN
1.0000 "application " | TOPICAL_LOTION | Freq: Every day | CUTANEOUS | 2 refills | Status: AC
  Filled 2023-04-10: qty 60, 30d supply, fill #0
  Filled 2023-05-06: qty 60, 30d supply, fill #1

## 2023-04-10 MED ORDER — DOXYCYCLINE HYCLATE 100 MG PO TABS
100.0000 mg | ORAL_TABLET | Freq: Two times a day (BID) | ORAL | 0 refills | Status: AC
  Filled 2023-04-10: qty 60, 30d supply, fill #0

## 2023-04-10 NOTE — Progress Notes (Signed)
   New Patient Visit   Subjective  Catherine Espinoza is a 47 y.o. female who presents for the following: bumps at all the skin folds for 31 years. She was seen by Sandford Craze at [facility] in 03/18/2023 and was prescribed Keflex for HS which did not help. She uses Hibiclens at home. She is complaining of drainage and odor coming from lesions.      The following portions of the chart were reviewed this encounter and updated as appropriate: medications, allergies, medical history  Review of Systems:  No other skin or systemic complaints except as noted in HPI or Assessment and Plan.  Objective  Well appearing patient in no apparent distress; mood and affect are within normal limits.  A focused examination was performed of the following areas:  Axillae, inframammary areas, groin, thighs, buttocks  Relevant exam findings are noted in the Assessment and Plan.    Assessment & Plan     HIDRADENITIS SUPPURATIVA [place] Stage 3: multiple interconnected sinus tracts and abscesses throughout and entire area   Exam: Bilateral indurated sub q cysts at sinus tracts and comedonal plugging  Not at goal  Hidradenitis Suppurativa is a chronic; persistent; non-curable, but treatable condition due to abnormal inflamed sweat glands in the body folds (axilla, inframammary, groin, medial thighs), causing recurrent painful draining cysts and scarring. It can be associated with severe scarring acne and cysts; also abscesses and scarring of scalp. The goal is control and prevention of flares, as it is not curable. Scars are permanent and can be thickened. Treatment may include daily use of topical medication and oral antibiotics.  Oral isotretinoin may also be helpful.  For some cases, Humira or Cosentyx (biologic injections) may be prescribed to decrease the inflammatory process and prevent flares.  When indicated, inflamed cysts may also be treated surgically.   Triggers discussed including smoking,  sugar, dairy, hormones, and obesity  Treatment Plan: Cerave 4% BPO cleanser Dove soap to other areas of body Clindamycin lotion to areas daily Doxycycline 100mg  BID x 7 days for flares  IRRITANT CONTACT DERMATITIS Exam: Scaly pink papules and/or plaques at the right arm  Treatment Plan: -OTC 1% Hydrocortisone cream twice daily for 1 week    Return in about 3 months (around 07/11/2023) for HS Follow UP.  Jaclynn Guarneri, CMA, am acting as scribe for [employer], DO.   Documentation: I have reviewed the above documentation for accuracy and completeness, and I agree with the above.  Langston Reusing, DO

## 2023-04-10 NOTE — Patient Instructions (Addendum)
Thank you for visiting our clinic today. We appreciate your commitment to improving your health and managing your condition of hidradenitis suppurativa. Here is a summary of the key instructions we discussed:  - Lifestyle Modifications:   - Reduce smoking, sugar, and dairy intake as these can trigger inflammation.  - Skin Care Routine:   - Continue using antibacterial washes like Hibiclens.   - Start using benzoyl peroxide (e.g., CeraVe 4% benzoyl peroxide) and Dove soap for hydration.   - Apply clindamycin lotion daily to affected areas to reduce bacterial count.  - Medication for Flare-ups:   - Keep doxycycline on hand; take 100 mg twice daily with food at the start of a flare-up for up to 7 days.  - Follow-up Appointment:   - Schedule a follow-up in 3 months to assess the effectiveness of the new regimen and discuss further treatment options if necessary.  Please remember to fill your prescription for doxycycline even if you are not currently experiencing a flare, so you have it available when needed. If you have any questions or concerns before our next appointment, do not hesitate to contact the office.       Due to recent changes in healthcare laws, you may see results of your pathology and/or laboratory studies on MyChart before the doctors have had a chance to review them. We understand that in some cases there may be results that are confusing or concerning to you. Please understand that not all results are received at the same time and often the doctors may need to interpret multiple results in order to provide you with the best plan of care or course of treatment. Therefore, we ask that you please give [place] 2 business days to thoroughly review all your results before contacting the office for clarification. Should we see a critical lab result, you will be contacted sooner.   If You Need Anything After Your Visit  If you have any questions or concerns for your doctor, please  call our main line at [phone] If no one answers, please leave a voicemail as directed and we will return your call as soon as possible. Messages left after 4 pm will be answered the following business day.   You may also send [place] a message via MyChart. We typically respond to MyChart messages within 1-2 business days.  For prescription refills, please ask your pharmacy to contact our office. Our fax number is [phone].  If you have an urgent issue when the clinic is closed that cannot wait until the next business day, you can page your doctor at the number below.    Please note that while we do our best to be available for urgent issues outside of office hours, we are not available 24/7.   If you have an urgent issue and are unable to reach [place], you may choose to seek medical care at your doctor's office, retail clinic, urgent care center, or emergency room.  If you have a medical emergency, please immediately call 911 or go to the emergency department. In the event of inclement weather, please call our main line at [phone] for an update on the status of any delays or closures.  Dermatology Medication Tips: Please keep the boxes that topical medications come in in order to help keep track of the instructions about where and how to use these. Pharmacies typically print the medication instructions only on the boxes and not directly on the medication tubes.   If your medication is  too expensive, please contact our office at [phone] or send [place] a message through MyChart.   We are unable to tell what your co-pay for medications will be in advance as this is different depending on your insurance coverage. However, we may be able to find a substitute medication at lower cost or fill out paperwork to get insurance to cover a needed medication.   If a prior authorization is required to get your medication covered by your insurance company, please allow [place] 1-2 business days to complete  this process.  Drug prices often vary depending on where the prescription is filled and some pharmacies may offer cheaper prices.  The website www.goodrx.com contains coupons for medications through different pharmacies. The prices here do not account for what the cost may be with help from insurance (it may be cheaper with your insurance), but the website can give you the price if you did not use any insurance.  - You can print the associated coupon and take it with your prescription to the pharmacy.  - You may also stop by our office during regular business hours and pick up a GoodRx coupon card.  - If you need your prescription sent electronically to a different pharmacy, notify our office through [facility] or by phone at [phone]

## 2023-04-11 ENCOUNTER — Other Ambulatory Visit (HOSPITAL_BASED_OUTPATIENT_CLINIC_OR_DEPARTMENT_OTHER): Payer: Self-pay

## 2023-05-01 ENCOUNTER — Other Ambulatory Visit: Payer: Self-pay | Admitting: Internal Medicine

## 2023-05-01 DIAGNOSIS — R808 Other proteinuria: Secondary | ICD-10-CM

## 2023-05-01 DIAGNOSIS — N189 Chronic kidney disease, unspecified: Secondary | ICD-10-CM

## 2023-05-01 DIAGNOSIS — I129 Hypertensive chronic kidney disease with stage 1 through stage 4 chronic kidney disease, or unspecified chronic kidney disease: Secondary | ICD-10-CM

## 2023-05-01 LAB — BASIC METABOLIC PANEL
BUN: 10 (ref 4–21)
CO2: 24 — AB (ref 13–22)
Chloride: 107 (ref 99–108)
Creatinine: 0.7 (ref 0.5–1.1)
Glucose: 135
Potassium: 4 mEq/L (ref 3.5–5.1)
Sodium: 138 (ref 137–147)

## 2023-05-01 LAB — COMPREHENSIVE METABOLIC PANEL
Albumin: 2.9 — AB (ref 3.5–5.0)
Calcium: 9.1 (ref 8.7–10.7)
eGFR: 104

## 2023-05-13 ENCOUNTER — Ambulatory Visit
Admission: RE | Admit: 2023-05-13 | Discharge: 2023-05-13 | Disposition: A | Discharge status: Home / Self Care | Payer: Commercial Managed Care - PPO | Source: Ambulatory Visit | Attending: Internal Medicine | Admitting: Internal Medicine

## 2023-05-13 DIAGNOSIS — N189 Chronic kidney disease, unspecified: Secondary | ICD-10-CM

## 2023-05-13 DIAGNOSIS — R808 Other proteinuria: Secondary | ICD-10-CM

## 2023-05-13 DIAGNOSIS — I129 Hypertensive chronic kidney disease with stage 1 through stage 4 chronic kidney disease, or unspecified chronic kidney disease: Secondary | ICD-10-CM

## 2023-06-27 NOTE — Progress Notes (Signed)
Advanced Heart Failure Clinic Note   PCP: Sandford Craze, NP HF Cardiologist: Dr. Gala Romney  Reason for visit/CC: Follow up for HF  HPI: Catherine Espinoza is a 47 y.o.female with DM2, HTN, morbid obesity, CAD with previous LAD stent, OSA and systolic HF  Presented to ED on 10/11/20 for leg swelling. Echo EF 20-25%. R/LHC completed; patient had PCI to LAD, distal RCA disease treated medically, preserved CO, elevated filling pressures.   Echo 03/12/21 EF 40-45%   Sleep study 5/23 Severe OSA with AHI 47 She missed appointment for CPAP titration in August d/t illness.  Echo 6/23: EF 50-55%, grade I DD, RV systolic function not well visualized, RVSP 32 mmHg  Today she returns for HF follow up. Overall feeling fine. She is not SOB with activity or work duties. Works at [employer], on her feet most of the day. Denies palpitations, abnormal bleeding, CP, dizziness, edema, or PND/Orthopnea. Appetite ok. No fever or chills. Weight at home 260-270 pounds. Taking all medications. Denies ETOH use. Smokes about 1/2 ppd. Not ready to quit. BP at home average 130-140s. Wears CPAP.   Echo today 06/30/23, EF appears 50-55% on my read, official MD interpretation pending.  Cardiac Studies  - Echo 6/23: EF 50-55%, grade I DD, RV systolic function not well visualized, RVSP 32 mmHg  Echo 1/22: EF 20-25% with global hypokinesis, mild LVH, grade I DD, RVSP 54, small pericardial effusion and IVC dilated w/ RAP 15.  R/LHC 1/22: 2v CAD with high grade lesions in the mid LAD and distal RCA, severe mixed ischemic/non-ischemic CM EF < 20%, markedly elevated filling pressures with normal CO RHC/LHC: PCI to LAD Prox RCA lesion is 30% stenosed. RPDA lesion is 95% stenosed. RPAV lesion is 99% stenosed. Prox LAD to Mid LAD lesion is 40% stenosed. Mid LAD lesion is 80% stenosed. 2nd Diag lesion is 50% stenosed.    Ao = 147/97 (119) LV =  140/27 RA =  12 RV = 68/15 PA = 72/37 (51) PCW = 34 Fick cardiac output/index  = 6.3/2.9 PVR = 2.6 WU FA sat = 97% PA sat = 72%, 72%  ROS: All systems reviewed and negative except as per HPI.   Past Medical History:  Diagnosis Date   Abscess of right hand 07/13/2021   Anemia, unspecified    CAD (coronary artery disease)    Chronic left-sided thoracic back pain    Excessive or frequent menstruation    Morbid obesity (HCC)    Obstructive sleep apnea 02/11/2011   Sleep study: severe OSA- rec CPAP 20cm small  full face mask   Proteinuria    Thoracic radiculopathy    Type II or unspecified type diabetes mellitus without mention of complication, not stated as uncontrolled    Unspecified essential hypertension    Current Outpatient Medications  Medication Sig Dispense Refill   acetaminophen (TYLENOL) 500 MG tablet Take 1,000 mg by mouth every 6 (six) hours as needed for mild pain or moderate pain.     amLODipine (NORVASC) 10 MG tablet Take 1 tablet (10 mg total) by mouth daily. 90 tablet 0   blood glucose meter kit and supplies KIT Dispense based on patient and insurance preference. Use up to four times daily as directed. (FOR ICD-9 250.00, 250.01). 1 each 0   carvedilol (COREG) 12.5 MG tablet Take 2 tablets (25 mg total) by mouth 2 (two) times daily with a meal. 60 tablet 11   clindamycin (CLEOCIN-T) 1 % lotion Apply 1 application to affected areas  daily. 60 mL 2   empagliflozin (JARDIANCE) 25 MG TABS tablet Take 1 tablet (25 mg total) by mouth daily before breakfast. 90 tablet 3   fluconazole (DIFLUCAN) 150 MG tablet Take 1 tablet (150 mg total) by mouth daily. (Patient taking differently: Take 150 mg by mouth daily. As needed) 1 tablet 1   furosemide (LASIX) 40 MG tablet Take 1 tablet (40 mg total) by mouth daily. 90 tablet 3   Insulin Pen Needle 32G X 6 MM MISC Use as directed once daily in the afternoon 100 each 3   Insulin Syringe 27G X 1/2" 0.5 ML MISC Use as directed 100 each 3   rosuvastatin (CRESTOR) 40 MG tablet TAKE 1 TABLET (40 MG TOTAL) BY MOUTH DAILY.  90 tablet 3   sacubitril-valsartan (ENTRESTO) 97-103 MG Take 1 tablet by mouth 2 (two) times daily. NEEDS APPOINTMENT FOR ANY MORE REFILLS 90 tablet 0   Semaglutide, 1 MG/DOSE, 4 MG/3ML SOPN Inject 1 mg as directed once a week. 9 mL 3   spironolactone (ALDACTONE) 25 MG tablet TAKE 1 TABLET (25 MG TOTAL) BY MOUTH DAILY. 90 tablet 3   Continuous Glucose Sensor (FREESTYLE LIBRE 2 SENSOR) MISC USE AS DIRECTED (Patient not taking: Reported on 06/30/2023) 6 each 3   No current facility-administered medications for this encounter.   Allergies  Allergen Reactions   Ibuprofen     REACTION: knots in mouth with SOB   Labetalol Nausea Only   Aspirin Other (See Comments)    Knots in mouth/ Higher doses    Social History   Socioeconomic History   Marital status: Single    Spouse name: Not on file   Number of children: 1   Years of education: Not on file   Highest education level: Associate degree: occupational, Scientist, product/process development, or vocational program  Occupational History   Occupation: cna/med Theme park manager: [employer]  Tobacco Use   Smoking status: Every Day    Current packs/day: 0.50    Average packs/day: 0.5 packs/day for 10.0 years (5.0 ttl pk-yrs)    Types: Cigarettes   Smokeless tobacco: Never  Vaping Use   Vaping status: Never Used  Substance and Sexual Activity   Alcohol use: Yes    Alcohol/week: 0.0 standard drinks of alcohol    Comment: occasional use   Drug use: No   Sexual activity: Yes    Birth control/protection: Surgical  Other Topics Concern   Not on file  Social History Narrative   Married-filing for divorced   Daughter born 2003   Works as cna/med [employer]   Social Determinants of Health   Financial Resource Strain: High Risk (10/12/2020)   Overall Financial Resource Strain (CARDIA)    Difficulty of Paying Living Expenses: Hard  Food Insecurity: No Food Insecurity (10/12/2020)   Hunger Vital Sign    Worried About Running Out of Food in the Last Year: Never true     Ran Out of Food in the Last Year: Never true  Transportation Needs: No Transportation Needs (10/12/2020)   PRAPARE - Administrator, Civil Service (Medical): No    Lack of Transportation (Non-Medical): No  Physical Activity: Not on file  Stress: Not on file  Social Connections: Unknown (02/08/2022)   [facility]   Social Network    Social Network: Not on file  Intimate Partner Violence: Unknown (01/01/2022)   Received from [facility]   HITS    Physically Hurt: Not  on file    Insult or Talk Down To: Not on file    Threaten Physical Harm: Not on file    Scream or Curse: Not on file   Family History  Problem Relation Age of Onset   Heart attack Maternal Aunt    Diabetes Mother    Cancer Father        oral cancer   BP (!) 148/88   Pulse 81   Wt 124.1 kg (273 lb 9.6 oz)   LMP 09/13/2011   SpO2 96%   BMI 45.53 kg/m   Wt Readings from Last 3 Encounters:  06/30/23 124.1 kg (273 lb 9.6 oz)  04/02/23 117.9 kg (260 lb)  03/25/23 123.4 kg (272 lb)   PHYSICAL EXAM: General:  NAD. No resp difficulty, walked into clinic HEENT: Normal Neck: Supple. No JVD. Carotids 2+ bilat; no bruits. No lymphadenopathy or thryomegaly appreciated. Cor: PMI nondisplaced. Regular rate & rhythm. No rubs, gallops or murmurs. Lungs: Clear Abdomen: Soft, obese, nontender, nondistended. No hepatosplenomegaly. No bruits or masses. Good bowel sounds. Extremities: No cyanosis, clubbing, rash, edema Neuro: Alert & oriented x 3, cranial nerves grossly intact. Moves all 4 extremities w/o difficulty. Affect pleasant.  ECG (personally reviewed): NSR 78 bpm  ASSESSMENT & PLAN: 1.  Chronic Systolic Heart Failure - Echo 1/61 EF 20-25% with global hypokinesis, mild LVH, grade I DD, RVSP 54, small pericardial effusion and IVC dilated w/ RAP 15.  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 - NYHA I-II. Volume status looks good - Continue Lasix 40 mg daily + 20 mEq of KCl. - Continue carvedilol 25 mg bid - Continue spironolactone 25 mg daily. - Continue Jardiance - Continue Entresto 97/103 mg bid. - Did not tolerate Bidil due to lightheadedness - Labs from 05/13/23 reviewed and stable, K 4, SCr 0.72  2. CAD - S/P PCI LAD 1/22 - Continue ASA + Crestor - No s/s angina - Lipids being managed by Dr. Rennis Golden - Stressed need for smoking cessation, stress reduction and DM2 control  3. DM2 - Improved control. A1c 9.0%-> 6.7 (02/21/23) - On semaglutide and Farxiga - Following with endocrine   4. HTN - BP elevated here, but better controlled on home checks - Didn't toelrate Bidil - Continue amlodipine - OSA and obesity likely contributing - Meds as above   5. Obesity - Body mass index is 45.53 kg/m. - Managed by PCP  6. Tobacco use - Smoking 1/2 ppd - Discussed cessation   7. OSA - Severe. AHI 47 - Compliant with CPAP  8. HLD - On Crestor 40 mg daily. - She stopped Repatha and has been lost to follow up at Lipid Clinic - Previously followed by Dr. Rennis Golden, repeat Lipid panel today.  Follow up in 4 months with Dr. Gala Romney. Consider graduation for AHF clinic at that time.  Anderson [place], FNP 06/30/23

## 2023-06-30 ENCOUNTER — Encounter (HOSPITAL_COMMUNITY): Payer: Self-pay

## 2023-06-30 ENCOUNTER — Ambulatory Visit (HOSPITAL_COMMUNITY)
Admission: RE | Admit: 2023-06-30 | Discharge: 2023-06-30 | Disposition: A | Discharge status: Home / Self Care | Payer: Commercial Managed Care - PPO | Source: Ambulatory Visit | Attending: Internal Medicine | Admitting: Internal Medicine

## 2023-06-30 ENCOUNTER — Ambulatory Visit (HOSPITAL_BASED_OUTPATIENT_CLINIC_OR_DEPARTMENT_OTHER)
Admission: RE | Admit: 2023-06-30 | Discharge: 2023-06-30 | Disposition: A | Discharge status: Home / Self Care | Payer: Commercial Managed Care - PPO | Source: Ambulatory Visit | Attending: Family Medicine | Admitting: Family Medicine

## 2023-06-30 ENCOUNTER — Telehealth (HOSPITAL_COMMUNITY): Payer: Self-pay

## 2023-06-30 VITALS — BP 148/88 | HR 81 | Wt 273.6 lb

## 2023-06-30 DIAGNOSIS — E119 Type 2 diabetes mellitus without complications: Secondary | ICD-10-CM

## 2023-06-30 DIAGNOSIS — F17200 Nicotine dependence, unspecified, uncomplicated: Secondary | ICD-10-CM

## 2023-06-30 DIAGNOSIS — Z79899 Other long term (current) drug therapy: Secondary | ICD-10-CM | POA: Diagnosis not present

## 2023-06-30 DIAGNOSIS — Z6841 Body Mass Index (BMI) 40.0 and over, adult: Secondary | ICD-10-CM | POA: Diagnosis not present

## 2023-06-30 DIAGNOSIS — F17210 Nicotine dependence, cigarettes, uncomplicated: Secondary | ICD-10-CM | POA: Diagnosis not present

## 2023-06-30 DIAGNOSIS — G4733 Obstructive sleep apnea (adult) (pediatric): Secondary | ICD-10-CM | POA: Insufficient documentation

## 2023-06-30 DIAGNOSIS — I083 Combined rheumatic disorders of mitral, aortic and tricuspid valves: Secondary | ICD-10-CM | POA: Diagnosis not present

## 2023-06-30 DIAGNOSIS — E785 Hyperlipidemia, unspecified: Secondary | ICD-10-CM

## 2023-06-30 DIAGNOSIS — Z7985 Long-term (current) use of injectable non-insulin antidiabetic drugs: Secondary | ICD-10-CM | POA: Diagnosis not present

## 2023-06-30 DIAGNOSIS — Z7984 Long term (current) use of oral hypoglycemic drugs: Secondary | ICD-10-CM | POA: Insufficient documentation

## 2023-06-30 DIAGNOSIS — I5022 Chronic systolic (congestive) heart failure: Secondary | ICD-10-CM

## 2023-06-30 DIAGNOSIS — I10 Essential (primary) hypertension: Secondary | ICD-10-CM

## 2023-06-30 DIAGNOSIS — Z7982 Long term (current) use of aspirin: Secondary | ICD-10-CM | POA: Diagnosis not present

## 2023-06-30 DIAGNOSIS — I2510 Atherosclerotic heart disease of native coronary artery without angina pectoris: Secondary | ICD-10-CM

## 2023-06-30 DIAGNOSIS — Z794 Long term (current) use of insulin: Secondary | ICD-10-CM

## 2023-06-30 DIAGNOSIS — E669 Obesity, unspecified: Secondary | ICD-10-CM | POA: Insufficient documentation

## 2023-06-30 DIAGNOSIS — Z955 Presence of coronary angioplasty implant and graft: Secondary | ICD-10-CM | POA: Insufficient documentation

## 2023-06-30 DIAGNOSIS — I110 Hypertensive heart disease with heart failure: Secondary | ICD-10-CM | POA: Insufficient documentation

## 2023-06-30 LAB — ECHOCARDIOGRAM COMPLETE
Area-P 1/2: 4.08 cm2
Calc EF: 44.4 %
MV M vel: 4.38 m/s
MV Peak grad: 76.7 mm[Hg]
S' Lateral: 4.2 cm
Single Plane A2C EF: 41.5 %
Single Plane A4C EF: 46.9 %

## 2023-06-30 LAB — LIPID PANEL
Cholesterol: 240 mg/dL — ABNORMAL HIGH (ref 0–200)
HDL: 48 mg/dL (ref >40)
LDL Cholesterol: 167 mg/dL — ABNORMAL HIGH (ref 0–99)
Total CHOL/HDL Ratio: 5 {ratio}
Triglycerides: 123 mg/dL (ref <150)
VLDL: 25 mg/dL (ref 0–40)

## 2023-06-30 NOTE — Progress Notes (Signed)
  Echocardiogram 2D Echocardiogram has been performed.  Catherine Espinoza 06/30/2023, 9:59 AM

## 2023-06-30 NOTE — Telephone Encounter (Signed)
-----   Message from [place] sent at 06/30/2023  3:19 PM EDT ----- LDL remains elevated at 167, goal is < 55.  She is on Crestor 40 mg daily. Needs to restart Repatha. Please refer back to Lipid Clinic to get back on PCSK9i

## 2023-06-30 NOTE — Patient Instructions (Signed)
Medication Changes:  No Changes In Medications at this time.   Lab Work:  Labs done today, your results will be available in MyChart, we will contact you for abnormal readings.  Follow-Up in: 4 MONTH FOLLOW UP PLEASE CALL OUR OFFICE AROUND LATE NOVEMBER TO GET SCHEDULED FOR YOUR APPOINTMENT. PHONE NUMBER IS [phone] OPTION 2    At the Advanced Heart Failure Clinic, you and your health needs are our priority. We have a designated team specialized in the treatment of Heart Failure. This Care Team includes your primary Heart Failure Specialized Cardiologist (physician), Advanced Practice Providers (APPs- Physician Assistants and Nurse Practitioners), and Pharmacist who all work together to provide you with the care you need, when you need it.   You may see any of the following providers on your designated Care Team at your next follow up:  Dr. Arvilla Meres Dr. Marca Ancona Dr. Marcos Eke, NP Robbie Lis, [place] [facility] [place], [place] Brynda Peon, NP Karle Plumber, PharmD   Please be sure to bring in all your medications bottles to every appointment.   Need to Contact us:  If you have any questions or concerns before your next appointment please send [place] a message through [place] or call our office at [phone].    TO LEAVE A MESSAGE FOR THE NURSE SELECT OPTION 2, PLEASE LEAVE A MESSAGE INCLUDING: YOUR NAME DATE OF BIRTH CALL BACK NUMBER REASON FOR CALL**this is important as we prioritize the call backs  YOU WILL RECEIVE A CALL BACK THE SAME DAY AS LONG AS YOU CALL BEFORE 4:00 PM

## 2023-06-30 NOTE — Telephone Encounter (Signed)
Spoke with patient regarding the following results. Patient made aware and patient verbalized understanding.   Referral to pharmD placed.   Advised patient to call back to office with any issues, questions, or concerns. Patient verbalized understanding.

## 2023-07-08 ENCOUNTER — Telehealth: Payer: Self-pay | Admitting: Pharmacist

## 2023-07-08 ENCOUNTER — Telehealth: Payer: Self-pay | Admitting: Pharmacy Technician

## 2023-07-08 ENCOUNTER — Other Ambulatory Visit (HOSPITAL_COMMUNITY): Payer: Self-pay

## 2023-07-08 ENCOUNTER — Ambulatory Visit: Payer: Commercial Managed Care - PPO | Attending: Family Medicine

## 2023-07-08 NOTE — Telephone Encounter (Signed)
Assess coverage for PCSK9i

## 2023-07-08 NOTE — Telephone Encounter (Signed)
Pharmacy Patient Advocate Encounter  Received notification from [facility] that Prior Authorization for repatha has been APPROVED from 07/08/23 to 07/06/24. Ran test claim, Copay is $45.00- one month. This test claim was processed through [facility]- copay amounts may vary at other pharmacies due to pharmacy/plan contracts, or as the patient moves through the different stages of their insurance plan.   PA #/Case ID/Reference #: [id number]

## 2023-07-08 NOTE — Progress Notes (Deleted)
Patient ID: Catherine Espinoza                 DOB: 1976-03-22                    MRN: [id number]      HPI: Catherine Espinoza is a 47 y.o. female patient referred to lipid clinic by Melany Guernsey. PMH is significant for DM2, HTN, morbid obesity, CAD with previous LAD stent, OSA and systolic HF.Echo 1/22 EF 20-25%,Echo 6/23: EF 50-55%,Echo today 06/30/23, EF appears 50-55%  Patient was on Evalocumab -last fill 05/2022 patient has stopped using it has lost follow up with Dr.Hilty.  Patient presented today for lipid clinic today.  Reviewed options for lowering LDL cholesterol, including ezetimibe, PCSK-9 inhibitors, bempedoic acid and inclisiran.  Discussed mechanisms of action, dosing, side effects and potential decreases in LDL cholesterol.  Also reviewed cost information and potential options for patient assistance.   Current Medications: Crestor 40 mg daily  Intolerances: none Risk Factors: DM2, HTN, morbid obesity, CAD with previous LAD stent, OSA and systolic HF  LDL goal: <55 mg/dl Last Lab: LDLc 621, TC 240, TG 123, HDLc 48 - while on high intensity statin  Current BP/HF medications: Spironolactone 25 mg daily, amlodipine 10 mg daily, carvedilol 25 mg twice daily, Jardiance 25 mg daily, Entresto 97-103 mg twice daily, Lasix 40 mg daily + 20 mEq of KCl  Intolerances: Bidil due to lightheadedness  BP goal:<130/80  Diet:   Exercise:   Family History:   Social History:   Labs: Lipid Panel     Component Value Date/Time   CHOL 240 (H) 06/30/2023 1035   TRIG 123 06/30/2023 1035   HDL 48 06/30/2023 1035   CHOLHDL 5.0 06/30/2023 1035   VLDL 25 06/30/2023 1035   LDLCALC 167 (H) 06/30/2023 1035   LDLDIRECT 171.0 07/04/2021 1201    Past Medical History:  Diagnosis Date   Abscess of right hand 07/13/2021   Anemia, unspecified    CAD (coronary artery disease)    Chronic left-sided thoracic back pain    Excessive or frequent menstruation    Morbid obesity (HCC)    Obstructive  sleep apnea 02/11/2011   Sleep study: severe OSA- rec CPAP 20cm small  full face mask   Proteinuria    Thoracic radiculopathy    Type II or unspecified type diabetes mellitus without mention of complication, not stated as uncontrolled    Unspecified essential hypertension     Current Outpatient Medications on File Prior to Visit  Medication Sig Dispense Refill   acetaminophen (TYLENOL) 500 MG tablet Take 1,000 mg by mouth every 6 (six) hours as needed for mild pain or moderate pain.     amLODipine (NORVASC) 10 MG tablet Take 1 tablet (10 mg total) by mouth daily. 90 tablet 0   aspirin EC 81 MG tablet Take 81 mg by mouth daily. Swallow whole.     blood glucose meter kit and supplies KIT Dispense based on patient and insurance preference. Use up to four times daily as directed. (FOR ICD-9 250.00, 250.01). 1 each 0   carvedilol (COREG) 12.5 MG tablet Take 2 tablets (25 mg total) by mouth 2 (two) times daily with a meal. 60 tablet 11   clindamycin (CLEOCIN-T) 1 % lotion Apply 1 application to affected areas daily. 60 mL 2   Continuous Glucose Sensor (FREESTYLE LIBRE 2 SENSOR) MISC USE AS DIRECTED (Patient not taking: Reported on 06/30/2023) 6 each 3   empagliflozin (JARDIANCE) 25  MG TABS tablet Take 1 tablet (25 mg total) by mouth daily before breakfast. 90 tablet 3   fluconazole (DIFLUCAN) 150 MG tablet Take 1 tablet (150 mg total) by mouth daily. (Patient taking differently: Take 150 mg by mouth daily. As needed) 1 tablet 1   furosemide (LASIX) 40 MG tablet Take 1 tablet (40 mg total) by mouth daily. 90 tablet 3   Insulin Pen Needle 32G X 6 MM MISC Use as directed once daily in the afternoon 100 each 3   Insulin Syringe 27G X 1/2" 0.5 ML MISC Use as directed 100 each 3   rosuvastatin (CRESTOR) 40 MG tablet TAKE 1 TABLET (40 MG TOTAL) BY MOUTH DAILY. 90 tablet 3   sacubitril-valsartan (ENTRESTO) 97-103 MG Take 1 tablet by mouth 2 (two) times daily. NEEDS APPOINTMENT FOR ANY MORE REFILLS 90 tablet  0   Semaglutide, 1 MG/DOSE, 4 MG/3ML SOPN Inject 1 mg as directed once a week. 9 mL 3   spironolactone (ALDACTONE) 25 MG tablet TAKE 1 TABLET (25 MG TOTAL) BY MOUTH DAILY. 90 tablet 3   No current facility-administered medications on file prior to visit.    Allergies  Allergen Reactions   Ibuprofen     REACTION: knots in mouth with SOB   Labetalol Nausea Only   Aspirin Other (See Comments)    Knots in mouth/ Higher doses     Assessment/Plan:  1. Hyperlipidemia -  No problems updated. No problem-specific Assessment & Plan notes found for this encounter.    Thank you,  Carmela Hurt, Pharm.D [facility] A Division of [facility] [address], [place], [place] [address]  Phone: [phone]; Fax: [phone]

## 2023-07-08 NOTE — Telephone Encounter (Signed)
Pharmacy Patient Advocate Encounter   Received notification from [facility] Calls Messages that prior authorization for repatha is required/requested.   Insurance verification completed.   The patient is insured through [employer] .   Per test claim: PA required; PA submitted to [facility] via CoverMyMeds Key/confirmation #/[id number] Status is pending

## 2023-07-10 ENCOUNTER — Ambulatory Visit: Payer: Commercial Managed Care - PPO | Admitting: Dermatology

## 2023-07-10 ENCOUNTER — Encounter: Payer: Self-pay | Admitting: Dermatology

## 2023-07-10 VITALS — BP 151/98

## 2023-07-10 DIAGNOSIS — L732 Hidradenitis suppurativa: Secondary | ICD-10-CM | POA: Diagnosis not present

## 2023-07-10 DIAGNOSIS — L723 Sebaceous cyst: Secondary | ICD-10-CM

## 2023-07-10 DIAGNOSIS — Z720 Tobacco use: Secondary | ICD-10-CM | POA: Diagnosis not present

## 2023-07-10 MED ORDER — TRIAMCINOLONE ACETONIDE 10 MG/ML IJ SUSP
10.0000 mg | Freq: Once | INTRAMUSCULAR | Status: AC
Start: 2023-07-10 — End: 2023-07-10
  Administered 2023-07-10: 10 mg

## 2023-07-10 NOTE — Progress Notes (Signed)
   Follow-Up Visit   Subjective  Catherine Espinoza is a 47 y.o. female who presents for the following: HS follow up - it is better under her arms and in her groin area. She has one on her breast that keeps flaring. She is washing with Cerave BPO 4% and applying Clindamycin lotion daily. She is taking Doxycycline 100 mg x 7 days for flares    The following portions of the chart were reviewed this encounter and updated as appropriate: medications, allergies, medical history  Review of Systems:  No other skin or systemic complaints except as noted in HPI or Assessment and Plan.  Objective  Well appearing patient in no apparent distress; mood and affect are within normal limits.   A focused examination was performed of the following areas:   Relevant exam findings are noted in the Assessment and Plan.    Assessment & Plan   1. Hidradenitis Suppurativa - Assessment: Active inflamed lesion on the breast. - Plan: Continue regimen of washing with benzoyl peroxide and CeraVe. Apply Clindamycin lotion as directed. Utilize doxycycline for flares as needed. Administered 0.15 mL of corticosteroid injection into the inflamed lesion. Reassess the effectiveness of the treatment at the next visit in three months.  2. Tobacco Use - Assessment: Patient is a smoker. - Plan: Encourage the patient to reduce smoking to a quarter pack a day. Discuss the adverse effects of smoking on hidradenitis suppurativa and overall health. Plan to reevaluate smoking habits at the next visit.  3. Dietary Recommendations - Assessment: Current diet not specified. - Plan: Advise the patient to increase intake of anti-inflammatory foods, such as vegetables and berries. Highlight the potential benefits of an anti-inflammatory diet on hidradenitis suppurativa and overall health.     Hidradenitis suppurativa  Related Medications triamcinolone acetonide (KENALOG) 10 MG/ML injection 10 mg   Inflamed sebaceous  cyst Right Breast  Continue washing with Cerave 4% BPO cleanser daily, Clindamycin lotion every day. Continue taking doxycycline 100 mg 1 po bid as needed for flares.  Advised patient to try to avoid squeezing areas. Discussed cutting smoking back and adding more vegetables and berries to her diet to help fight the inflammation.  Intralesional injection - Right Breast Procedure Note Intralesional Injection  Location: right breast  Informed Consent: Discussed risks (infection, pain, bleeding, bruising, thinning of the skin, loss of skin pigment, lack of resolution, and recurrence of lesion) and benefits of the procedure, as well as the alternatives. Informed consent was obtained. Preparation: The area was prepared a standard fashion.  Anesthesia:none  Procedure Details: An intralesional injection was performed with Kenalog 10 mg/cc. 0.15 cc in total were injected. NDC #: [id number] Lot # [id number] Exp: 10/2024  Total number of injections: 1  Plan: The patient was instructed on post-op care. Recommend OTC analgesia as needed for pain.   triamcinolone acetonide (KENALOG) 10 MG/ML injection 10 mg - Right Breast     Return in about 3 months (around 10/10/2023) for HS , Follow up.  I, Joanie Coddington, CMA, am acting as scribe for [employer], DO .   Documentation: I have reviewed the above documentation for accuracy and completeness, and I agree with the above.  Langston Reusing, DO

## 2023-07-10 NOTE — Patient Instructions (Signed)
Hello [name],  Thank you for visiting my office today. Your dedication to managing your hidradenitis is commendable, and I appreciate your openness during our discussion. Below is a summary of the key instructions from today's visit:  - Cleansing Routine: Continue washing with CeraVe to maintain skin hygiene.  - Topical Treatment: Apply Clindamycin lotion as previously directed to affected areas.  - Oral Medication: Use doxycycline during flare-ups, especially if you notice drainage, to help control infection.  - New Treatment Approach: Today, we tried injecting the affected area on your breast to help reduce inflammation. This treatment may provide relief for a duration ranging from two weeks to three months.  - Preventive Measures: Please avoid squeezing the lesions to prevent worsening the infection.  - Lifestyle Modification: Work on reducing your smoking to a quarter pack a day to help improve your condition.  - Dietary Changes: Incorporate more anti-inflammatory foods into your diet, such as vegetables and berries, to support overall skin health.  Follow-Up: We will follow up in three months to assess the progress and effectiveness of the injection. Should you have any questions or concerns before then, please do not hesitate to contact our office.  Warm regards,  Dr. [facility] Re[facility]    Important Information  Due to recent changes in healthcare laws, you may see results of your pathology and/or laboratory studies on MyChart before the doctors have had a chance to review them. We understand that in some cases there may be results that are confusing or concerning to you. Please understand that not all results are received at the same time and often the doctors may need to interpret multiple results in order to provide you with the best plan of care or course of treatment. Therefore, we ask that you please give [place] 2 business days to thoroughly review all your results  before contacting the office for clarification. Should we see a critical lab result, you will be contacted sooner.   If You Need Anything After Your Visit  If you have any questions or concerns for your doctor, please call our main line at [phone] If no one answers, please leave a voicemail as directed and we will return your call as soon as possible. Messages left after 4 pm will be answered the following business day.   You may also send [place] a message via MyChart. We typically respond to MyChart messages within 1-2 business days.  For prescription refills, please ask your pharmacy to contact our office. Our fax number is [phone].  If you have an urgent issue when the clinic is closed that cannot wait until the next business day, you can page your doctor at the number below.    Please note that while we do our best to be available for urgent issues outside of office hours, we are not available 24/7.   If you have an urgent issue and are unable to reach [place], you may choose to seek medical care at your doctor's office, retail clinic, urgent care center, or emergency room.  If you have a medical emergency, please immediately call 911 or go to the emergency department. In the event of inclement weather, please call our main line at [phone] for an update on the status of any delays or closures.  Dermatology Medication Tips: Please keep the boxes that topical medications come in in order to help keep track of the instructions about where and how to use these. Pharmacies typically print the medication instructions only on  the boxes and not directly on the medication tubes.   If your medication is too expensive, please contact our office at [phone] or send [place] a message through MyChart.   We are unable to tell what your co-pay for medications will be in advance as this is different depending on your insurance coverage. However, we may be able to find a substitute medication at  lower cost or fill out paperwork to get insurance to cover a needed medication.   If a prior authorization is required to get your medication covered by your insurance company, please allow [place] 1-2 business days to complete this process.  Drug prices often vary depending on where the prescription is filled and some pharmacies may offer cheaper prices.  The website www.goodrx.com contains coupons for medications through different pharmacies. The prices here do not account for what the cost may be with help from insurance (it may be cheaper with your insurance), but the website can give you the price if you did not use any insurance.  - You can print the associated coupon and take it with your prescription to the pharmacy.  - You may also stop by our office during regular business hours and pick up a GoodRx coupon card.  - If you need your prescription sent electronically to a different pharmacy, notify our office through [facility] or by phone at [phone]

## 2023-07-21 ENCOUNTER — Other Ambulatory Visit (HOSPITAL_BASED_OUTPATIENT_CLINIC_OR_DEPARTMENT_OTHER): Payer: Self-pay

## 2023-07-29 ENCOUNTER — Encounter: Payer: Self-pay | Admitting: Pharmacist

## 2023-07-29 ENCOUNTER — Ambulatory Visit: Payer: Commercial Managed Care - PPO | Attending: Internal Medicine | Admitting: Pharmacist

## 2023-07-29 ENCOUNTER — Other Ambulatory Visit (HOSPITAL_BASED_OUTPATIENT_CLINIC_OR_DEPARTMENT_OTHER): Payer: Self-pay

## 2023-07-29 DIAGNOSIS — I5022 Chronic systolic (congestive) heart failure: Secondary | ICD-10-CM

## 2023-07-29 DIAGNOSIS — E785 Hyperlipidemia, unspecified: Secondary | ICD-10-CM

## 2023-07-29 DIAGNOSIS — Z794 Long term (current) use of insulin: Secondary | ICD-10-CM | POA: Diagnosis not present

## 2023-07-29 DIAGNOSIS — E119 Type 2 diabetes mellitus without complications: Secondary | ICD-10-CM

## 2023-07-29 MED ORDER — ENTRESTO 97-103 MG PO TABS
1.0000 | ORAL_TABLET | Freq: Two times a day (BID) | ORAL | 11 refills | Status: AC
Start: 2023-07-29 — End: 2024-07-28
  Filled 2023-07-29: qty 60, 30d supply, fill #0

## 2023-07-29 MED ORDER — ROSUVASTATIN CALCIUM 40 MG PO TABS
40.0000 mg | ORAL_TABLET | Freq: Every day | ORAL | 3 refills | Status: AC
Start: 2023-07-29 — End: ?
  Filled 2023-07-29: qty 90, 90d supply, fill #0

## 2023-07-29 MED ORDER — REPATHA SURECLICK 140 MG/ML ~~LOC~~ SOAJ
1.0000 mL | SUBCUTANEOUS | 11 refills | Status: DC
Start: 2023-07-29 — End: 2024-04-16
  Filled 2023-07-29: qty 2, 28d supply, fill #0
  Filled 2023-12-23 – 2024-04-13 (×3): qty 2, 28d supply, fill #1

## 2023-07-29 NOTE — Progress Notes (Signed)
Patient ID: Catherine Espinoza                 DOB: 27-May-1976                    MRN: [id number]     HPI: Catherine Espinoza is a 47 y.o. female patient referred to lipid clinic by [facility]. PMH is significant for CHF, T2DM, obesity, CAD, proteinuria, and smoking.  Patient seen by Catherine Espinoza on 06/30/23. LDL elevated to 167 despite patient reporting compliance with rosuvastatin. Was previously on Repatha but stopped taking due to costs.  Presents today in good spirits. Works as a Scientist, clinical (histocompatibility and immunogenetics). Has a high deductible insurance plan so many of her medications were unaffordable to her.   A1c has improved greatly from 13.5 two years ago to 6.7 five months ago. Has follow up with PCP next month where Ozempic may be titrated.  Current Medications: Rosuvastatin 40mg   Risk Factors:  CAD Smoking T2DM CHF  LDL goal: <55  Labs: TC 240, Trigs 123, HDL 48, LDL 167 (06/30/23)  Past Medical History:  Diagnosis Date   Abscess of right hand 07/13/2021   Anemia, unspecified    CAD (coronary artery disease)    Chronic left-sided thoracic back pain    Excessive or frequent menstruation    Morbid obesity (HCC)    Obstructive sleep apnea 02/11/2011   Sleep study: severe OSA- rec CPAP 20cm small  full face mask   Proteinuria    Thoracic radiculopathy    Type II or unspecified type diabetes mellitus without mention of complication, not stated as uncontrolled    Unspecified essential hypertension     Current Outpatient Medications on File Prior to Visit  Medication Sig Dispense Refill   acetaminophen (TYLENOL) 500 MG tablet Take 1,000 mg by mouth every 6 (six) hours as needed for mild pain or moderate pain.     amLODipine (NORVASC) 10 MG tablet Take 1 tablet (10 mg total) by mouth daily. 90 tablet 0   aspirin EC 81 MG tablet Take 81 mg by mouth daily. Swallow whole.     blood glucose meter kit and supplies KIT Dispense based on patient and insurance preference. Use up to four times daily as  directed. (FOR ICD-9 250.00, 250.01). 1 each 0   carvedilol (COREG) 12.5 MG tablet Take 2 tablets (25 mg total) by mouth 2 (two) times daily with a meal. 60 tablet 11   clindamycin (CLEOCIN-T) 1 % lotion Apply 1 application to affected areas daily. 60 mL 2   Continuous Glucose Sensor (FREESTYLE LIBRE 2 SENSOR) MISC USE AS DIRECTED (Patient not taking: Reported on 06/30/2023) 6 each 3   empagliflozin (JARDIANCE) 25 MG TABS tablet Take 1 tablet (25 mg total) by mouth daily before breakfast. 90 tablet 3   fluconazole (DIFLUCAN) 150 MG tablet Take 1 tablet (150 mg total) by mouth daily. (Patient taking differently: Take 150 mg by mouth daily. As needed) 1 tablet 1   furosemide (LASIX) 40 MG tablet Take 1 tablet (40 mg total) by mouth daily. 90 tablet 3   Insulin Pen Needle 32G X 6 MM MISC Use as directed once daily in the afternoon 100 each 3   Insulin Syringe 27G X 1/2" 0.5 ML MISC Use as directed 100 each 3   rosuvastatin (CRESTOR) 40 MG tablet TAKE 1 TABLET (40 MG TOTAL) BY MOUTH DAILY. 90 tablet 3   sacubitril-valsartan (ENTRESTO) 97-103 MG Take 1 tablet by mouth 2 (two) times daily.  NEEDS APPOINTMENT FOR ANY MORE REFILLS 90 tablet 0   Semaglutide, 1 MG/DOSE, 4 MG/3ML SOPN Inject 1 mg as directed once a week. 9 mL 3   spironolactone (ALDACTONE) 25 MG tablet TAKE 1 TABLET (25 MG TOTAL) BY MOUTH DAILY. 90 tablet 3   No current facility-administered medications on file prior to visit.    Allergies  Allergen Reactions   Ibuprofen     REACTION: knots in mouth with SOB   Labetalol Nausea Only   Aspirin Other (See Comments)    Knots in mouth/ Higher doses     Assessment/Plan:  1. Hyperlipidemia - Patient LDL 167 which is above goal of <55. Aggressive goal due to CHF, CAD, and T2DM. From reviewing dispensing history, patient has not picked up rosuvastatin recently and prescription is expired. Will send refill and restart Repatha. Discussed storage, administration, and possible adverse effects.  Patient expresses that she remembers how to administer. Recheck lipid panel in 2-3 months.  Restart rosuvastatin 40mg  daily Restart Repatha 140mg  q 2 weeks Recheck lipid panel in 2-3 months  Laural Golden, PharmD, BCACP, CDCES, CPP [address], [address] [place], [place], [address] Phone: [phone], Fax: [phone]

## 2023-07-29 NOTE — Patient Instructions (Signed)
It was nice meeting you today  We would like your LDL (bad cholesterol) to be less than 55  Please continue your rosuvastatin 40mg  once daily  We will restart your Repatha 140mg . You will inject one pen once every 2 weeks  I will also have also ordered a repeat fasting lipid panel that you can have drawn at any [facility] or one of our clinics  Please let us know if you have any questions  Laural Golden, PharmD, BCACP, CDCES, CPP [address] [place], [place], [address] Phone: [phone], Fax: [phone]

## 2023-08-06 DIAGNOSIS — E1165 Type 2 diabetes mellitus with hyperglycemia: Secondary | ICD-10-CM

## 2023-08-25 ENCOUNTER — Other Ambulatory Visit (HOSPITAL_BASED_OUTPATIENT_CLINIC_OR_DEPARTMENT_OTHER): Payer: Self-pay

## 2023-08-25 ENCOUNTER — Encounter: Payer: Self-pay | Admitting: Internal Medicine

## 2023-08-25 ENCOUNTER — Ambulatory Visit (INDEPENDENT_AMBULATORY_CARE_PROVIDER_SITE_OTHER): Payer: Commercial Managed Care - PPO | Admitting: Internal Medicine

## 2023-08-25 VITALS — BP 132/82 | HR 100 | Ht 65.0 in | Wt 285.0 lb

## 2023-08-25 DIAGNOSIS — K219 Gastro-esophageal reflux disease without esophagitis: Secondary | ICD-10-CM

## 2023-08-25 DIAGNOSIS — E1165 Type 2 diabetes mellitus with hyperglycemia: Secondary | ICD-10-CM

## 2023-08-25 DIAGNOSIS — Z794 Long term (current) use of insulin: Secondary | ICD-10-CM

## 2023-08-25 DIAGNOSIS — E1159 Type 2 diabetes mellitus with other circulatory complications: Secondary | ICD-10-CM | POA: Diagnosis not present

## 2023-08-25 LAB — POCT GLUCOSE (DEVICE FOR HOME USE): POC Glucose: 104 mg/dL — AB (ref 70–99)

## 2023-08-25 LAB — POCT GLYCOSYLATED HEMOGLOBIN (HGB A1C): Hemoglobin A1C: 7.3 % — AB (ref 4.0–5.6)

## 2023-08-25 MED ORDER — OMEPRAZOLE 20 MG PO CPDR
20.0000 mg | DELAYED_RELEASE_CAPSULE | Freq: Every day | ORAL | 3 refills | Status: AC
  Filled 2023-08-25: qty 90, 90d supply, fill #0

## 2023-08-25 MED ORDER — EMPAGLIFLOZIN 25 MG PO TABS
25.0000 mg | ORAL_TABLET | Freq: Every day | ORAL | 3 refills | Status: DC
  Filled 2023-08-25: qty 90, 90d supply, fill #0

## 2023-08-25 MED ORDER — SEMAGLUTIDE (2 MG/DOSE) 8 MG/3ML ~~LOC~~ SOPN
2.0000 mg | PEN_INJECTOR | SUBCUTANEOUS | 3 refills | Status: DC
  Filled 2023-08-25: qty 9, 84d supply, fill #0
  Filled 2023-08-25: qty 3, 28d supply, fill #0
  Filled 2023-09-29: qty 9, 84d supply, fill #0

## 2023-08-25 NOTE — Patient Instructions (Addendum)
-   Continue Jardiance 25 mg, 1 tablet every morning  - Increase Ozempic 2  mg weekly    HOW TO TREAT LOW BLOOD SUGARS (Blood sugar LESS THAN 70 MG/DL) Please follow the RULE OF 15 for the treatment of hypoglycemia treatment (when your (blood sugars are less than 70 mg/dL)   STEP 1: Take 15 grams of carbohydrates when your blood sugar is low, which includes:  3-4 GLUCOSE TABS  OR 3-4 OZ OF JUICE OR REGULAR SODA OR ONE TUBE OF GLUCOSE GEL    STEP 2: RECHECK blood sugar in 15 MINUTES STEP 3: If your blood sugar is still low at the 15 minute recheck --> then, go back to STEP 1 and treat AGAIN with another 15 grams of carbohydrates.

## 2023-08-25 NOTE — Progress Notes (Signed)
Name: Catherine Espinoza  MRN/ DOB: [id number], 06/18/1976   Age/ Sex: 47 y.o., female    PCP: Sandford Craze, NP   Reason for Endocrinology Evaluation: Type 2 Diabetes Mellitus     Date of Initial Endocrinology Visit: 12/04/2021    PATIENT IDENTIFIER: [name] is a 47 y.o. female with a past medical history of T2DM, HTN, CAD and CHF. The patient presented for initial endocrinology clinic visit on 12/04/2021 for consultative assistance with her diabetes management.    HPI: Catherine Espinoza was    Diagnosed with DM 2011 Prior Medications tried/Intolerance: Metformin -GI intolerance Hemoglobin A1c has ranged from 9.4% in 2012, peaking at 14.9% in 2019.   On her initial visit , her A1c 9.0% . Continued Toujeo , increased Jardiance and started Rybelsus which was switched to Ozempic per her request   She self discontinued  Toujeo 04/2022 due to hypoglycemia , A1c 3 months later was 6.5%     I attempted to screen her for [place] with 24-hour urinary cortisol in July 2023 but she did not return the sample  SUBJECTIVE:   During the last visit (02/21/2023): A1c 6.7 %      Today (08/25/23): Catherine Espinoza is here for follow-up on diabetes management. She has not been able to use the freestyle libre  not covered by her insurance anymore.   She continues to follow-up with cardiology for CHF and dyslipidemia She has occasional nausea and vomiting a few days after taking Ozempic  No constipation or diarrhea  She was Out of Ozempic until 07/2023 after meeting her deductible.  Has heartburn   HOME DIABETES REGIMEN: Jardiance 25 mg daily  Ozempic 1 mg weekly       Statin: yes ACE-I/ARB: yes Prior Diabetic Education: Yes      DIABETIC COMPLICATIONS: Microvascular complications:   Denies: CKD, neuropathy, retinopathy Last eye exam: Completed years ago  Macrovascular complications:  CAD/CHF Denies:  PVD, CVA   PAST HISTORY: Past Medical History:  Past Medical  History:  Diagnosis Date   Abscess of right hand 07/13/2021   Anemia, unspecified    CAD (coronary artery disease)    Chronic left-sided thoracic back pain    Excessive or frequent menstruation    Morbid obesity (HCC)    Obstructive sleep apnea 02/11/2011   Sleep study: severe OSA- rec CPAP 20cm small  full face mask   Proteinuria    Thoracic radiculopathy    Type II or unspecified type diabetes mellitus without mention of complication, not stated as uncontrolled    Unspecified essential hypertension    Past Surgical History:  Past Surgical History:  Procedure Laterality Date   ABDOMINAL HYSTERECTOMY     ABLATION COLPOCLESIS     CESAREAN SECTION     CLEFT PALATE REPAIR     CORONARY STENT INTERVENTION N/A 10/13/2020   Procedure: CORONARY STENT INTERVENTION;  Surgeon: Kathleene Hazel, MD;  Location: [facility] INVASIVE [facility];  Service: Cardiovascular;  Laterality: N/A;   cyst removal     ECTOPIC PREGNANCY SURGERY     x 2   I & D EXTREMITY Right 07/13/2021   Procedure: IRRIGATION AND DEBRIDEMENT OF HAND;  Surgeon: Marlyne Beards, MD;  Location: [facility] OR;  Service: Orthopedics;  Laterality: Right;   OOPHORECTOMY Right 2002   RIGHT/LEFT HEART CATH AND CORONARY ANGIOGRAPHY N/A 10/13/2020   Procedure: RIGHT/LEFT HEART CATH AND CORONARY ANGIOGRAPHY;  Surgeon: Dolores Patty, MD;  Location: [facility] INVASIVE CV LAB;  Service: Cardiovascular;  Laterality: N/A;  Social History:  reports that she has been smoking cigarettes. She has a 5 pack-year smoking history. She has never used smokeless tobacco. She reports current alcohol use. She reports that she does not use drugs. Family History:  Family History  Problem Relation Age of Onset   Heart attack Maternal Aunt    Diabetes Mother    Cancer Father        oral cancer     HOME MEDICATIONS: Allergies as of 08/25/2023       Reactions   Ibuprofen    REACTION: knots in mouth with SOB   Labetalol Nausea Only   Aspirin Other (See  Comments)   Knots in mouth/ Higher doses        Medication List        Accurate as of August 25, 2023  2:48 PM. If you have any questions, ask your nurse or doctor.          acetaminophen 500 MG tablet Commonly known as: TYLENOL Take 1,000 mg by mouth every 6 (six) hours as needed for mild pain or moderate pain.   amLODipine 10 MG tablet Commonly known as: NORVASC Take 1 tablet (10 mg total) by mouth daily.   aspirin EC 81 MG tablet Take 81 mg by mouth daily. Swallow whole.   blood glucose meter kit and supplies Kit Dispense based on patient and insurance preference. Use up to four times daily as directed. (FOR ICD-9 250.00, 250.01).   carvedilol 12.5 MG tablet Commonly known as: COREG Take 2 tablets (25 mg total) by mouth 2 (two) times daily with a meal.   clindamycin 1 % lotion Commonly known as: Cleocin-T Apply 1 application to affected areas daily.   empagliflozin 25 MG Tabs tablet Commonly known as: Jardiance Take 1 tablet (25 mg total) by mouth daily before breakfast.   Entresto 97-103 MG Generic drug: sacubitril-valsartan Take 1 tablet by mouth 2 (two) times daily.   fluconazole 150 MG tablet Commonly known as: Diflucan Take 1 tablet (150 mg total) by mouth daily. What changed: additional instructions   FreeStyle Libre 2 Sensor Misc USE AS DIRECTED   furosemide 40 MG tablet Commonly known as: LASIX Take 1 tablet (40 mg total) by mouth daily.   Insulin Syringe 27G X 1/2" 0.5 ML Misc Use as directed   Ozempic (1 MG/DOSE) 4 MG/3ML Sopn Generic drug: Semaglutide (1 MG/DOSE) Inject 1 mg as directed once a week.   Repatha SureClick 140 MG/ML Soaj Generic drug: Evolocumab Inject 140 mg into the skin every 14 (fourteen) days.   rosuvastatin 40 MG tablet Commonly known as: CRESTOR Take 1 tablet (40 mg total) by mouth daily.   spironolactone 25 MG tablet Commonly known as: ALDACTONE TAKE 1 TABLET (25 MG TOTAL) BY MOUTH DAILY.   TechLite Pen  Needles 32G X 6 MM Misc Generic drug: Insulin Pen Needle Use as directed once daily in the afternoon         ALLERGIES: Allergies  Allergen Reactions   Ibuprofen     REACTION: knots in mouth with SOB   Labetalol Nausea Only   Aspirin Other (See Comments)    Knots in mouth/ Higher doses         OBJECTIVE:   VITAL SIGNS: BP 132/82 (BP Location: Left Arm, Patient Position: Sitting, Cuff Size: Large)   Pulse 100   Ht 5\' 5"  (1.651 m)   Wt 285 lb (129.3 kg)   LMP 09/13/2011   SpO2 94%   BMI 47.43  kg/m    PHYSICAL EXAM:  General: Pt appears well and is in NAD  Lungs: Clear with good BS bilat  Heart: RRR   Abdomen: Soft, nontender  Extremities:  Lower extremities - No pretibial edema. No lesions.  Neuro: MS is good with appropriate affect, pt is alert and Ox3    DM foot exam: 08/25/2023  The skin of the feet is intact without sores or ulcerations. The pedal pulses are 2+ on right and 2+ on left. The sensation is intact to a screening 5.07, 10 gram monofilament bilaterally    DATA REVIEWED:  Lab Results  Component Value Date   HGBA1C 7.3 (A) 08/25/2023   HGBA1C 6.7 (A) 02/21/2023   HGBA1C 6.5 (A) 08/21/2022     Latest Reference Range & Units 05/01/23 00:00  Sodium 137 - 147  138 (E)  Potassium 3.5 - 5.1 mEq/L 4.0 (E)  Chloride 99 - 108  107 (E)  CO2 13 - 22  24 ! (E)  Glucose  135 (E)  BUN 4 - 21  10 (E)  Creatinine 0.5 - 1.1  0.7 (E)  Calcium 8.7 - 10.7  9.1 (E)  eGFR  104 (E)  Albumin 3.5 - 5.0  2.9 ! (E)    ASSESSMENT / PLAN / RECOMMENDATIONS:   1) Type 2 Diabetes Mellitus, Sub-Optimally controlled, With macrovascular complications - Most recent A1c of 7.3 %. Goal A1c < 7.0 %.     -A1c has trended up slightly -She was without Ozempic for few months, due to a high deductible of $5000.  She was able to get Ozempic in October, she has been noted with nausea and vomiting that she attributes to GERD rather than Ozempic.  I will prescribe  Prilosec -I will increase Ozempi   MEDICATIONS:  Continue Jardiance 25 mg daily Increase Ozempic 2 mg weekly    EDUCATION / INSTRUCTIONS: BG monitoring instructions: Patient is instructed to check her blood sugars 3 times a day, before meals. Call [facility] if: BG persistently < 70 I reviewed the Rule of 15 for the treatment of hypoglycemia in detail with the patient. Literature supplied.   2) Diabetic complications:  Eye: Does not have known diabetic retinopathy.  Neuro/ Feet: Does not have known diabetic peripheral neuropathy. Renal: Patient does not have known baseline CKD. She is  on an ACEI/ARB at present.  3) Dyslipidemia/CAD :   - Per cardiology   4) GERD:  -I prescribed Prilosec 20 mg daily    Follow-up in 4 months  Signed electronically by: Lyndle Herrlich, MD  [facility] Endocrinology  [facility] [address] [place] [place], [place] [address] Phone: [phone] FAX: [phone]   CC: Sandford Craze, NP [address] [facility] [address] [place] [address] Phone: [phone]  Fax: [phone]    Return to Endocrinology clinic as below: Future Appointments  Date Time Provider Department Center  10/14/2023  2:15 PM Terri Piedra, DO [facility] None

## 2023-08-26 ENCOUNTER — Other Ambulatory Visit (HOSPITAL_COMMUNITY): Payer: Self-pay

## 2023-08-26 ENCOUNTER — Other Ambulatory Visit (HOSPITAL_BASED_OUTPATIENT_CLINIC_OR_DEPARTMENT_OTHER): Payer: Self-pay

## 2023-08-26 ENCOUNTER — Telehealth: Payer: Self-pay

## 2023-08-26 NOTE — Telephone Encounter (Signed)
Pharmacy Patient Advocate Encounter   Received notification from CoverMyMeds that prior authorization for Ozempic is required/requested.   Insurance verification completed.   The patient is insured through [facility] .   Per test claim: PA required; PA submitted to above mentioned insurance via CoverMyMeds Key/confirmation #/EOC [id number] Status is pending

## 2023-09-01 ENCOUNTER — Other Ambulatory Visit (HOSPITAL_BASED_OUTPATIENT_CLINIC_OR_DEPARTMENT_OTHER): Payer: Self-pay

## 2023-09-02 ENCOUNTER — Other Ambulatory Visit (HOSPITAL_BASED_OUTPATIENT_CLINIC_OR_DEPARTMENT_OTHER): Payer: Self-pay

## 2023-09-04 ENCOUNTER — Other Ambulatory Visit (HOSPITAL_BASED_OUTPATIENT_CLINIC_OR_DEPARTMENT_OTHER): Payer: Self-pay

## 2023-09-05 NOTE — Telephone Encounter (Signed)
Pharmacy Patient Advocate Encounter  Received notification from [facility] that Prior Authorization for Ozempic has been APPROVED from 08/26/23 to 08/25/24

## 2023-09-15 LAB — HM DIABETES EYE EXAM

## 2023-09-18 ENCOUNTER — Encounter: Payer: Self-pay | Admitting: *Deleted

## 2023-09-19 ENCOUNTER — Encounter: Payer: Self-pay | Admitting: Family

## 2023-09-22 ENCOUNTER — Telehealth: Payer: Self-pay | Admitting: Family

## 2023-09-22 NOTE — Telephone Encounter (Signed)
Please contact pt to schedule a follow up visit with me.  

## 2023-09-29 ENCOUNTER — Other Ambulatory Visit (HOSPITAL_BASED_OUTPATIENT_CLINIC_OR_DEPARTMENT_OTHER): Payer: Self-pay

## 2023-10-10 ENCOUNTER — Ambulatory Visit: Payer: Commercial Managed Care - PPO | Admitting: Family

## 2023-10-14 ENCOUNTER — Ambulatory Visit: Payer: Commercial Managed Care - PPO | Admitting: Dermatology

## 2023-10-15 ENCOUNTER — Other Ambulatory Visit (HOSPITAL_COMMUNITY)
Admission: RE | Admit: 2023-10-15 | Discharge: 2023-10-15 | Disposition: A | Discharge status: Home / Self Care | Payer: No Typology Code available for payment source | Source: Ambulatory Visit | Attending: Family | Admitting: Family

## 2023-10-15 ENCOUNTER — Other Ambulatory Visit (HOSPITAL_BASED_OUTPATIENT_CLINIC_OR_DEPARTMENT_OTHER): Payer: Self-pay

## 2023-10-15 ENCOUNTER — Ambulatory Visit (INDEPENDENT_AMBULATORY_CARE_PROVIDER_SITE_OTHER): Payer: No Typology Code available for payment source | Admitting: Family

## 2023-10-15 VITALS — BP 161/81 | HR 102 | Temp 97.2°F | Resp 16 | Ht 65.0 in | Wt 274.0 lb

## 2023-10-15 DIAGNOSIS — L732 Hidradenitis suppurativa: Secondary | ICD-10-CM

## 2023-10-15 DIAGNOSIS — E1165 Type 2 diabetes mellitus with hyperglycemia: Secondary | ICD-10-CM

## 2023-10-15 DIAGNOSIS — F418 Other specified anxiety disorders: Secondary | ICD-10-CM

## 2023-10-15 DIAGNOSIS — I10 Essential (primary) hypertension: Secondary | ICD-10-CM

## 2023-10-15 DIAGNOSIS — Z23 Encounter for immunization: Secondary | ICD-10-CM

## 2023-10-15 DIAGNOSIS — Z794 Long term (current) use of insulin: Secondary | ICD-10-CM | POA: Diagnosis not present

## 2023-10-15 DIAGNOSIS — N898 Other specified noninflammatory disorders of vagina: Secondary | ICD-10-CM | POA: Insufficient documentation

## 2023-10-15 MED ORDER — CARVEDILOL 12.5 MG PO TABS: 12.5000 mg | ORAL_TABLET | Freq: Two times a day (BID) | ORAL | Status: DC

## 2023-10-15 MED ORDER — HYDRALAZINE HCL 25 MG PO TABS
25.0000 mg | ORAL_TABLET | Freq: Two times a day (BID) | ORAL | 2 refills | Status: DC
  Filled 2023-10-15: qty 60, 30d supply, fill #0

## 2023-10-15 NOTE — Assessment & Plan Note (Signed)
 Lab Results  Component Value Date   CHOL 240 (H) 06/30/2023   HDL 48 06/30/2023   LDLCALC 167 (H) 06/30/2023   LDLDIRECT 171.0 07/04/2021   TRIG 123 06/30/2023   CHOLHDL 5.0 06/30/2023

## 2023-10-15 NOTE — Progress Notes (Signed)
Subjective:     Patient ID: Catherine Espinoza, female    DOB: 1975-10-16, 48 y.o.   MRN: [id number]  Chief Complaint  Patient presents with   Hypertension    Here for follow up    HPI  Discussed the use of AI scribe software for clinical note transcription with the patient, who gave verbal consent to proceed.  History of Present Illness   The patient, with a history of diabetes, hypertension, and hidradenitis suppurativa, presents with a concern about a fishy odor. She denies any associated vaginal discharge or itching. She has been managing her diabetes with Ozempic, which she had to ration due to insurance issues in the past year. She reports her A1C was 7.8, up from 6.5. She has recently changed jobs due to stress and insurance issues at her previous job. She reports her new job has better insurance coverage and less stress. She also reports vision changes, which her optometrist attributed to age rather than diabetes. She has not had any recent flare-ups of hidradenitis suppurativa and has been managing it with benzoyl peroxide wash and clindamycin lotion. She also reports proteinuria, for which she is seeing a nephrologist.          Health Maintenance Due  Topic Date Due   Hepatitis C Screening  Never done   Pneumococcal Vaccine 64-53 Years old (2 of 2 - PCV) 06/21/2017   Colonoscopy  Never done   COVID-19 Vaccine (3 - 2024-25 season) 06/01/2023    Past Medical History:  Diagnosis Date   Abscess of right hand 07/13/2021   Anemia, unspecified    CAD (coronary artery disease)    Chronic left-sided thoracic back pain    Diabetes mellitus type 2 with complications (HCC)    Diabetic retinopathy (HCC)    Excessive or frequent menstruation    Morbid obesity (HCC)    Obstructive sleep apnea 02/11/2011   Sleep study: severe OSA- rec CPAP 20cm small  full face mask   Proteinuria    Thoracic radiculopathy    Unspecified essential hypertension     Past Surgical History:   Procedure Laterality Date   ABDOMINAL HYSTERECTOMY     ABLATION COLPOCLESIS     CESAREAN SECTION     CLEFT PALATE REPAIR     CORONARY STENT INTERVENTION N/A 10/13/2020   Procedure: CORONARY STENT INTERVENTION;  Surgeon: Kathleene Hazel, MD;  Location: [facility];  Service: Cardiovascular;  Laterality: N/A;   cyst removal     ECTOPIC PREGNANCY SURGERY     x 2   I & D EXTREMITY Right 07/13/2021   Procedure: IRRIGATION AND DEBRIDEMENT OF HAND;  Surgeon: Marlyne Beards, MD;  Location: [facility] OR;  Service: Orthopedics;  Laterality: Right;   OOPHORECTOMY Right 2002   RIGHT/LEFT HEART CATH AND CORONARY ANGIOGRAPHY N/A 10/13/2020   Procedure: RIGHT/LEFT HEART CATH AND CORONARY ANGIOGRAPHY;  Surgeon: Dolores Patty, MD;  Location: [facility];  Service: Cardiovascular;  Laterality: N/A;    Family History  Problem Relation Age of Onset   Heart attack Maternal Aunt    Diabetes Mother    Cancer Father        oral cancer    Social History   Socioeconomic History   Marital status: Single    Spouse name: Not on file   Number of children: 1   Years of education: Not on file   Highest education level: Associate degree: occupational, Scientist, product/process development, or vocational program  Occupational History  Occupation: cna/med Theme park manager: [employer]  Tobacco Use   Smoking status: Every Day    Current packs/day: 0.50    Average packs/day: 0.5 packs/day for 10.0 years (5.0 ttl pk-yrs)    Types: Cigarettes   Smokeless tobacco: Never  Vaping Use   Vaping status: Never Used  Substance and Sexual Activity   Alcohol use: Yes    Alcohol/week: 0.0 standard drinks of alcohol    Comment: occasional use   Drug use: No   Sexual activity: Yes    Birth control/protection: Surgical  Other Topics Concern   Not on file  Social History Narrative   Married-filing for divorced   Daughter born 2003   Works as cna/med [employer]   Social Drivers of Health   Financial Resource Strain:  High Risk (10/12/2020)   Overall Financial Resource Strain (CARDIA)    Difficulty of Paying Living Expenses: Hard  Food Insecurity: No Food Insecurity (10/12/2020)   Hunger Vital Sign    Worried About Running Out of Food in the Last Year: Never true    Ran Out of Food in the Last Year: Never true  Transportation Needs: No Transportation Needs (10/12/2020)   PRAPARE - Administrator, Civil Service (Medical): No    Lack of Transportation (Non-Medical): No  Physical Activity: Not on file  Stress: Not on file  Social Connections: Unknown (02/08/2022)   [facility]   Social Network    Social Network: Not on file  Intimate Partner Violence: Unknown (01/01/2022)   Received from [facility]   HITS    Physically Hurt: Not on file    Insult or Talk Down To: Not on file    Threaten Physical Harm: Not on file    Scream or Curse: Not on file    Outpatient Medications Prior to Visit  Medication Sig Dispense Refill   acetaminophen (TYLENOL) 500 MG tablet Take 1,000 mg by mouth every 6 (six) hours as needed for mild pain or moderate pain.     amLODipine (NORVASC) 10 MG tablet Take 1 tablet (10 mg total) by mouth daily. 90 tablet 0   aspirin EC 81 MG tablet Take 81 mg by mouth daily. Swallow whole.     blood glucose meter kit and supplies KIT Dispense based on patient and insurance preference. Use up to four times daily as directed. (FOR ICD-9 250.00, 250.01). 1 each 0   clindamycin (CLEOCIN-T) 1 % lotion Apply 1 application to affected areas daily. 60 mL 2   Continuous Glucose Sensor (FREESTYLE LIBRE 2 SENSOR) MISC USE AS DIRECTED 6 each 3   empagliflozin (JARDIANCE) 25 MG TABS tablet Take 1 tablet (25 mg total) by mouth daily before breakfast. 90 tablet 3   Evolocumab (REPATHA SURECLICK) 140 MG/ML SOAJ Inject 140 mg into the skin every 14 (fourteen) days. 2 mL 11   fluconazole (DIFLUCAN) 150 MG tablet Take 1 tablet (150 mg total) by mouth  daily. (Patient taking differently: Take 150 mg by mouth daily. As needed) 1 tablet 1   furosemide (LASIX) 40 MG tablet Take 1 tablet (40 mg total) by mouth daily. 90 tablet 3   Insulin Pen Needle 32G X 6 MM MISC Use as directed once daily in the afternoon 100 each 3   Insulin Syringe 27G X 1/2" 0.5 ML MISC Use as directed 100 each 3   omeprazole (PRILOSEC) 20 MG capsule Take 1 capsule (20 mg total) by mouth daily.  90 capsule 3   rosuvastatin (CRESTOR) 40 MG tablet Take 1 tablet (40 mg total) by mouth daily. 90 tablet 3   sacubitril-valsartan (ENTRESTO) 97-103 MG Take 1 tablet by mouth 2 (two) times daily. 60 tablet 11   Semaglutide, 2 MG/DOSE, 8 MG/3ML SOPN Inject 2 mg as directed once a week. 9 mL 3   spironolactone (ALDACTONE) 25 MG tablet TAKE 1 TABLET (25 MG TOTAL) BY MOUTH DAILY. 90 tablet 3   carvedilol (COREG) 12.5 MG tablet Take 2 tablets (25 mg total) by mouth 2 (two) times daily with a meal. 60 tablet 11   No facility-administered medications prior to visit.    Allergies  Allergen Reactions   Ibuprofen     REACTION: knots in mouth with SOB   Labetalol Nausea Only   Aspirin Other (See Comments)    Knots in mouth/ Higher doses     ROS    See HPI Objective:    Physical Exam Constitutional:      General: She is not in acute distress.    Appearance: Normal appearance. She is well-developed.  HENT:     Head: Normocephalic and atraumatic.     Right Ear: External ear normal.     Left Ear: External ear normal.  Eyes:     General: No scleral icterus. Neck:     Thyroid: No thyromegaly.  Cardiovascular:     Rate and Rhythm: Normal rate and regular rhythm.     Heart sounds: Normal heart sounds. No murmur heard. Pulmonary:     Effort: Pulmonary effort is normal. No respiratory distress.     Breath sounds: Normal breath sounds. No wheezing.  Musculoskeletal:     Cervical back: Neck supple.  Skin:    General: Skin is warm and dry.  Neurological:     Mental Status: She  is alert and oriented to person, place, and time.  Psychiatric:        Mood and Affect: Mood normal.        Behavior: Behavior normal.        Thought Content: Thought content normal.        Judgment: Judgment normal.      BP (!) 161/81   Pulse (!) 102   Temp (!) 97.2 F (36.2 C) (Oral)   Resp 16   Ht 5\' 5"  (1.651 m)   Wt 274 lb (124.3 kg)   LMP 09/13/2011   SpO2 100%   BMI 45.60 kg/m  Wt Readings from Last 3 Encounters:  10/15/23 274 lb (124.3 kg)  08/25/23 285 lb (129.3 kg)  06/30/23 273 lb 9.6 oz (124.1 kg)       Assessment & Plan:   Problem List Items Addressed This Visit       Unprioritized   Type 2 diabetes mellitus with hyperglycemia, with long-term current use of insulin (HCC) - Primary   Lab Results  Component Value Date   HGBA1C 7.3 (A) 08/25/2023   HGBA1C 6.7 (A) 02/21/2023   HGBA1C 6.5 (A) 08/21/2022   Lab Results  Component Value Date   MICROALBUR 243.6 (H) 03/18/2023   LDLCALC 167 (H) 06/30/2023   CREATININE 0.7 05/01/2023   Following with Endocrinology. She has restarted ozempic.        Relevant Orders   Comp Met (CMET) (Completed)   Hypertension   BP Readings from Last 3 Encounters:  10/15/23 (!) 161/81  08/25/23 132/82  07/10/23 (!) 151/98   BP elevated.  Reports good compliance with meds.  Relevant Medications   carvedilol (COREG) 12.5 MG tablet   hydrALAZINE (APRESOLINE) 25 MG tablet   Hidradenitis suppurativa   She is following with dermatology, notes stable/improved.       Depression with anxiety   Much improved at her new job.        Other Visit Diagnoses       Vaginal odor       Relevant Orders   Cervicovaginal ancillary only( )     Needs flu shot       Relevant Orders   Flu vaccine trivalent PF, 6mos and older(Flulaval,Afluria,Fluarix,Fluzone) (Completed)     Patient performed self wet prep.  I have discontinued Verlie Mandel's carvedilol. I am also having her start on carvedilol and  hydrALAZINE. Additionally, I am having her maintain her Insulin Syringe, blood glucose meter kit and supplies, acetaminophen, Insulin Pen Needle, spironolactone, furosemide, FreeStyle Libre 2 Sensor, amLODipine, fluconazole, clindamycin, aspirin EC, rosuvastatin, Repatha SureClick, Entresto, empagliflozin, omeprazole, and Semaglutide (2 MG/DOSE).  Meds ordered this encounter  Medications   carvedilol (COREG) 12.5 MG tablet    Sig: Take 1 tablet (12.5 mg total) by mouth 2 (two) times daily with a meal.    Supervising Provider:   Danise Edge A [[id number]]   hydrALAZINE (APRESOLINE) 25 MG tablet    Sig: Take 1 tablet (25 mg total) by mouth in the morning and at bedtime.    Dispense:  60 tablet    Refill:  2    Supervising Provider:   Danise Edge A [[id number]]

## 2023-10-15 NOTE — Assessment & Plan Note (Addendum)
 Lab Results  Component Value Date   HGBA1C 7.3 (A) 08/25/2023   HGBA1C 6.7 (A) 02/21/2023   HGBA1C 6.5 (A) 08/21/2022   Lab Results  Component Value Date   MICROALBUR 243.6 (H) 03/18/2023   LDLCALC 167 (H) 06/30/2023   CREATININE 0.7 05/01/2023   Following with Endocrinology. She has restarted ozempic .

## 2023-10-15 NOTE — Assessment & Plan Note (Addendum)
 BP Readings from Last 3 Encounters:  10/15/23 (!) 161/81  08/25/23 132/82  07/10/23 (!) 151/98   BP elevated.  Reports good compliance with meds.

## 2023-10-15 NOTE — Assessment & Plan Note (Signed)
 She is following with dermatology, notes stable/improved.

## 2023-10-15 NOTE — Assessment & Plan Note (Signed)
 Much improved at her new job.

## 2023-10-16 LAB — COMPREHENSIVE METABOLIC PANEL
ALT: 16 U/L (ref 0–35)
AST: 13 U/L (ref 0–37)
Albumin: 3.4 g/dL — ABNORMAL LOW (ref 3.5–5.2)
Alkaline Phosphatase: 63 U/L (ref 39–117)
BUN: 23 mg/dL (ref 6–23)
CO2: 23 meq/L (ref 19–32)
Calcium: 9.1 mg/dL (ref 8.4–10.5)
Chloride: 105 meq/L (ref 96–112)
Creatinine, Ser: 1.17 mg/dL (ref 0.40–1.20)
GFR: 55.48 mL/min — ABNORMAL LOW (ref >60.00)
Glucose, Bld: 158 mg/dL — ABNORMAL HIGH (ref 70–99)
Potassium: 4.2 meq/L (ref 3.5–5.1)
Sodium: 137 meq/L (ref 135–145)
Total Bilirubin: 0.4 mg/dL (ref 0.2–1.2)
Total Protein: 7 g/dL (ref 6.0–8.3)

## 2023-10-16 NOTE — Patient Instructions (Signed)
VISIT SUMMARY:  Today, we discussed several of your ongoing health concerns, including diabetes, hidradenitis suppurativa, hypertension, and a new concern about a fishy odor. We also reviewed your general health maintenance needs.  YOUR PLAN:  -TYPE 2 DIABETES MELLITUS: Type 2 diabetes is a condition where your body does not use insulin properly, leading to high blood sugar levels. Your HbA1c is currently 7.8, which is slightly higher than your previous level of 6.5. Continue taking Ozempic as prescribed to help manage your blood sugar levels.  -HIDRADENITIS SUPPURATIVA: Hidradenitis suppurativa is a chronic skin condition that causes small, painful lumps under the skin. You have been managing it well with benzoyl peroxide wash and clindamycin lotion. Continue with this regimen.  -HYPERTENSION: Hypertension, or high blood pressure, can increase your risk of heart disease and stroke. Your blood pressure was slightly elevated today. We will recheck it to ensure it is under control. Continue taking amlodipine, spironolactone, and carvedilol as prescribed.  -VAGINAL ODOR: You reported a fishy odor without any discharge or itching. This could be due to bacterial vaginosis or other infections. We will perform a vaginal swab to test for bacterial vaginosis and gonorrhea/chlamydia.  -PROTEINURIA: Proteinuria means there is protein in your urine, which can be a sign of kidney problems. You are already seeing a nephrologist for this condition. We will check your kidney function today.  -GENERAL HEALTH MAINTENANCE: We discussed your general health maintenance, including the importance of getting a flu shot and scheduling a colonoscopy for routine screening.  INSTRUCTIONS:  Please follow up with the nephrologist for your proteinuria as planned. Make sure to get your blood pressure rechecked today. Also, consider getting a flu shot and scheduling a colonoscopy for routine health screening.

## 2023-10-17 ENCOUNTER — Telehealth: Payer: Self-pay | Admitting: Family

## 2023-10-17 ENCOUNTER — Other Ambulatory Visit (HOSPITAL_BASED_OUTPATIENT_CLINIC_OR_DEPARTMENT_OTHER): Payer: Self-pay

## 2023-10-17 LAB — CERVICOVAGINAL ANCILLARY ONLY
Bacterial Vaginitis (gardnerella): POSITIVE — AB
Candida Glabrata: NEGATIVE
Candida Vaginitis: NEGATIVE
Chlamydia: NEGATIVE
Comment: NEGATIVE
Comment: NEGATIVE
Comment: NEGATIVE
Comment: NEGATIVE
Comment: NEGATIVE
Comment: NORMAL
Neisseria Gonorrhea: NEGATIVE
Trichomonas: POSITIVE — AB

## 2023-10-17 MED ORDER — METRONIDAZOLE 500 MG PO TABS: 500.0000 mg | ORAL_TABLET | Freq: Two times a day (BID) | ORAL | 0 refills | Status: DC

## 2023-10-17 NOTE — Telephone Encounter (Signed)
Please advise pt that her swab shows Trichomonas and BV.  Trichomonas is sexually transmitted.  BV is likely cause for the odor she noted.  For both, treatment is the same:  metronidazole 500mg  bid x 7 days.   She should notify partner (s) and they should both avoid sexual contact until treatment is complete.  Also recommend regular condom use to prevent sexually transmitted infections.

## 2023-10-17 NOTE — Telephone Encounter (Signed)
Patient notified of results, new prescription and provider's recommendations. She verbalized understanding.

## 2023-10-20 ENCOUNTER — Telehealth: Payer: Self-pay | Admitting: Family

## 2023-10-20 ENCOUNTER — Other Ambulatory Visit (HOSPITAL_BASED_OUTPATIENT_CLINIC_OR_DEPARTMENT_OTHER): Payer: Self-pay

## 2023-10-20 ENCOUNTER — Other Ambulatory Visit: Payer: Self-pay

## 2023-10-20 MED ORDER — METRONIDAZOLE 500 MG PO TABS
500.0000 mg | ORAL_TABLET | Freq: Two times a day (BID) | ORAL | 0 refills | Status: AC
  Filled 2023-10-20: qty 14, 7d supply, fill #0

## 2023-10-20 NOTE — Telephone Encounter (Signed)
Copied from CRM [id number]. Topic: Clinical - Prescription Issue >> Oct 17, 2023  4:38 PM Alvino Blood C wrote: Reason for CRM: Patient is currently at the pharmacy and was advised that the following medication: metroNIDAZOLE (FLAGYL) 500 MG tablet was sent to the incorrect pharmacy. Patient is requesting the medication go to the following pharmacy: [facility] [place] [facility]  [address], [address]  [place] [address]  Phone: [phone] Fax: [phone]

## 2023-10-20 NOTE — Telephone Encounter (Signed)
RX cancelled at [facility] and sent to [facility]

## 2023-11-03 ENCOUNTER — Other Ambulatory Visit (HOSPITAL_BASED_OUTPATIENT_CLINIC_OR_DEPARTMENT_OTHER): Payer: Self-pay

## 2023-11-13 ENCOUNTER — Other Ambulatory Visit (HOSPITAL_BASED_OUTPATIENT_CLINIC_OR_DEPARTMENT_OTHER): Payer: Self-pay

## 2023-11-13 ENCOUNTER — Encounter (INDEPENDENT_AMBULATORY_CARE_PROVIDER_SITE_OTHER): Payer: No Typology Code available for payment source | Admitting: Family

## 2023-11-13 DIAGNOSIS — R067 Sneezing: Secondary | ICD-10-CM | POA: Diagnosis not present

## 2023-11-13 DIAGNOSIS — Z20828 Contact with and (suspected) exposure to other viral communicable diseases: Secondary | ICD-10-CM

## 2023-11-13 DIAGNOSIS — J111 Influenza due to unidentified influenza virus with other respiratory manifestations: Secondary | ICD-10-CM

## 2023-11-13 MED ORDER — OSELTAMIVIR PHOSPHATE 75 MG PO CAPS
75.0000 mg | ORAL_CAPSULE | Freq: Two times a day (BID) | ORAL | 0 refills | Status: DC
  Filled 2023-11-13: qty 10, 5d supply, fill #0

## 2023-11-13 NOTE — Telephone Encounter (Signed)

## 2023-11-18 ENCOUNTER — Encounter: Payer: No Typology Code available for payment source | Admitting: Family

## 2023-12-01 ENCOUNTER — Ambulatory Visit: Admitting: Dermatology

## 2023-12-23 ENCOUNTER — Ambulatory Visit: Payer: Commercial Managed Care - PPO | Admitting: Internal Medicine

## 2023-12-23 ENCOUNTER — Encounter: Payer: Self-pay | Admitting: Internal Medicine

## 2023-12-23 ENCOUNTER — Telehealth: Payer: Self-pay

## 2023-12-23 ENCOUNTER — Other Ambulatory Visit (HOSPITAL_BASED_OUTPATIENT_CLINIC_OR_DEPARTMENT_OTHER): Payer: Self-pay

## 2023-12-23 VITALS — BP 126/88 | HR 102 | Ht 65.0 in | Wt 274.0 lb

## 2023-12-23 DIAGNOSIS — E785 Hyperlipidemia, unspecified: Secondary | ICD-10-CM

## 2023-12-23 DIAGNOSIS — E1159 Type 2 diabetes mellitus with other circulatory complications: Secondary | ICD-10-CM | POA: Diagnosis not present

## 2023-12-23 DIAGNOSIS — Z794 Long term (current) use of insulin: Secondary | ICD-10-CM

## 2023-12-23 LAB — POCT GLYCOSYLATED HEMOGLOBIN (HGB A1C): Hemoglobin A1C: 6.4 % — AB (ref 4.0–5.6)

## 2023-12-23 LAB — POCT GLUCOSE (DEVICE FOR HOME USE): Glucose Fasting, POC: 92 mg/dL (ref 70–99)

## 2023-12-23 MED ORDER — EMPAGLIFLOZIN 25 MG PO TABS
25.0000 mg | ORAL_TABLET | Freq: Every day | ORAL | 3 refills | Status: AC
  Filled 2023-12-23: qty 90, 90d supply, fill #0

## 2023-12-23 MED ORDER — SEMAGLUTIDE (2 MG/DOSE) 8 MG/3ML ~~LOC~~ SOPN
2.0000 mg | PEN_INJECTOR | SUBCUTANEOUS | 3 refills | Status: DC
  Filled 2023-12-23 – 2024-03-22 (×2): qty 3, 28d supply, fill #0
  Filled 2024-05-30: qty 3, 28d supply, fill #1

## 2023-12-23 NOTE — Patient Instructions (Signed)
-   Continue Jardiance 25 mg, 1 tablet every morning  - Continue  Ozempic 2  mg weekly    HOW TO TREAT LOW BLOOD SUGARS (Blood sugar LESS THAN 70 MG/DL) Please follow the RULE OF 15 for the treatment of hypoglycemia treatment (when your (blood sugars are less than 70 mg/dL)   STEP 1: Take 15 grams of carbohydrates when your blood sugar is low, which includes:  3-4 GLUCOSE TABS  OR 3-4 OZ OF JUICE OR REGULAR SODA OR ONE TUBE OF GLUCOSE GEL    STEP 2: RECHECK blood sugar in 15 MINUTES STEP 3: If your blood sugar is still low at the 15 minute recheck --> then, go back to STEP 1 and treat AGAIN with another 15 grams of carbohydrates.

## 2023-12-23 NOTE — Telephone Encounter (Signed)
 Pharmacy Patient Advocate Encounter   Received notification from CoverMyMeds that prior authorization for  Ozempic (2 MG/DOSE) 8MG /3ML pen-injectors is required/requested.   Insurance verification completed.   The patient is insured through [facility] .   Per test claim: PA required; PA submitted to above mentioned insurance via CoverMyMeds Key/confirmation #/EOC [id number] Status is pending  Per insurance, patient is allowed one fill and we will proceed with prior auth after that.

## 2023-12-23 NOTE — Progress Notes (Signed)
 Name: Catherine Espinoza  MRN/ DOB: [id number], [birth year]   Age/ Sex: 48 y.o., female    PCP: Sandford Craze, NP   Reason for Endocrinology Evaluation: Type 2 Diabetes Mellitus     Date of Initial Endocrinology Visit: 12/04/2021    PATIENT IDENTIFIER: Catherine Espinoza is a 48 y.o. female with a past medical history of T2DM, HTN, CAD and CHF. The patient presented for initial endocrinology clinic visit on 12/04/2021 for consultative assistance with her diabetes management.    HPI: Catherine Espinoza was    Diagnosed with DM 2011 Prior Medications tried/Intolerance: Metformin -GI intolerance Hemoglobin A1c has ranged from 9.4% in 2012, peaking at 14.9% in 2019.   On her initial visit , her A1c 9.0% . Continued Toujeo , increased Jardiance and started Rybelsus which was switched to Ozempic per her request   She self discontinued  Toujeo 04/2022 due to hypoglycemia , A1c 3 months later was 6.5%     I attempted to screen her for [place] with 24-hour urinary cortisol in July 2023 but she did not return the sample  SUBJECTIVE:   During the last visit (08/25/2023): A1c 7.3 %      Today (12/23/23): [name] is here for follow-up on diabetes management.   She continues to follow-up with cardiology for CHF and dyslipidemia  Denies nausea or vomiting  Denies constipation or diarrhea     HOME DIABETES REGIMEN: Jardiance 25 mg daily  Ozempic 2 mg weekly       Statin: yes ACE-I/ARB: yes Prior Diabetic Education: Yes      DIABETIC COMPLICATIONS: Microvascular complications:   Denies: CKD, neuropathy, retinopathy Last eye exam: Completed years ago  Macrovascular complications:  CAD/CHF Denies:  PVD, CVA   PAST HISTORY: Past Medical History:  Past Medical History:  Diagnosis Date   Abscess of right hand 07/13/2021   Anemia, unspecified    CAD (coronary artery disease)    Chronic left-sided thoracic back pain    Diabetes mellitus type 2 with complications (HCC)     Diabetic retinopathy (HCC)    Excessive or frequent menstruation    Morbid obesity (HCC)    Obstructive sleep apnea 02/11/2011   Sleep study: severe OSA- rec CPAP 20cm small  full face mask   Proteinuria    Thoracic radiculopathy    Unspecified essential hypertension    Past Surgical History:  Past Surgical History:  Procedure Laterality Date   ABDOMINAL HYSTERECTOMY     ABLATION COLPOCLESIS     CESAREAN SECTION     CLEFT PALATE REPAIR     CORONARY STENT INTERVENTION N/A 10/13/2020   Procedure: CORONARY STENT INTERVENTION;  Surgeon: Kathleene Hazel, MD;  Location: [facility];  Service: Cardiovascular;  Laterality: N/A;   cyst removal     ECTOPIC PREGNANCY SURGERY     x 2   I & D EXTREMITY Right 07/13/2021   Procedure: IRRIGATION AND DEBRIDEMENT OF HAND;  Surgeon: Marlyne Beards, MD;  Location: [facility] OR;  Service: Orthopedics;  Laterality: Right;   OOPHORECTOMY Right 2002   RIGHT/LEFT HEART CATH AND CORONARY ANGIOGRAPHY N/A 10/13/2020   Procedure: RIGHT/LEFT HEART CATH AND CORONARY ANGIOGRAPHY;  Surgeon: Dolores Patty, MD;  Location: [facility];  Service: Cardiovascular;  Laterality: N/A;    Social History:  reports that she has been smoking cigarettes. She has a 5 pack-year smoking history. She has never used smokeless tobacco. She reports current alcohol use. She reports that she does not use drugs.  Family History:  Family History  Problem Relation Age of Onset   Heart attack Maternal Aunt    Diabetes Mother    Cancer Father        oral cancer     HOME MEDICATIONS: Allergies as of 12/23/2023       Reactions   Ibuprofen    REACTION: knots in mouth with SOB   Labetalol Nausea Only   Aspirin Other (See Comments)   Knots in mouth/ Higher doses        Medication List        Accurate as of December 23, 2023  1:40 PM. If you have any questions, ask your nurse or doctor.          acetaminophen 500 MG tablet Commonly known as:  TYLENOL Take 1,000 mg by mouth every 6 (six) hours as needed for mild pain or moderate pain.   amLODipine 10 MG tablet Commonly known as: NORVASC Take 1 tablet (10 mg total) by mouth daily.   aspirin EC 81 MG tablet Take 81 mg by mouth daily. Swallow whole.   blood glucose meter kit and supplies Kit Dispense based on patient and insurance preference. Use up to four times daily as directed. (FOR ICD-9 250.00, 250.01).   carvedilol 12.5 MG tablet Commonly known as: COREG Take 1 tablet (12.5 mg total) by mouth 2 (two) times daily with a meal.   clindamycin 1 % lotion Commonly known as: Cleocin-T Apply 1 application to affected areas daily.   empagliflozin 25 MG Tabs tablet Commonly known as: Jardiance Take 1 tablet (25 mg total) by mouth daily before breakfast.   Entresto 97-103 MG Generic drug: sacubitril-valsartan Take 1 tablet by mouth 2 (two) times daily.   fluconazole 150 MG tablet Commonly known as: Diflucan Take 1 tablet (150 mg total) by mouth daily. What changed: additional instructions   FreeStyle Libre 2 Sensor Misc USE AS DIRECTED   furosemide 40 MG tablet Commonly known as: LASIX Take 1 tablet (40 mg total) by mouth daily.   hydrALAZINE 25 MG tablet Commonly known as: APRESOLINE Take 1 tablet (25 mg total) by mouth in the morning and at bedtime.   Insulin Syringe 27G X 1/2" 0.5 ML Misc Use as directed   omeprazole 20 MG capsule Commonly known as: PRILOSEC Take 1 capsule (20 mg total) by mouth daily.   oseltamivir 75 MG capsule Commonly known as: Tamiflu Take 1 capsule (75 mg total) by mouth 2 (two) times daily.   Ozempic (2 MG/DOSE) 8 MG/3ML Sopn Generic drug: Semaglutide (2 MG/DOSE) Inject 2 mg as directed once a week.   Repatha SureClick 140 MG/ML Soaj Generic drug: Evolocumab Inject 140 mg into the skin every 14 (fourteen) days.   rosuvastatin 40 MG tablet Commonly known as: CRESTOR Take 1 tablet (40 mg total) by mouth daily.    spironolactone 25 MG tablet Commonly known as: ALDACTONE TAKE 1 TABLET (25 MG TOTAL) BY MOUTH DAILY.   TechLite Pen Needles 32G X 6 MM Misc Generic drug: Insulin Pen Needle Use as directed once daily in the afternoon         ALLERGIES: Allergies  Allergen Reactions   Ibuprofen     REACTION: knots in mouth with SOB   Labetalol Nausea Only   Aspirin Other (See Comments)    Knots in mouth/ Higher doses         OBJECTIVE:   VITAL SIGNS: BP 126/88 (BP Location: Left Arm, Patient Position: Sitting, Cuff Size: Large)  Pulse (!) 102   Ht 5\' 5"  (1.651 m)   Wt 274 lb (124.3 kg)   LMP 09/13/2011   SpO2 98%   BMI 45.60 kg/m    PHYSICAL EXAM:  General: Pt appears well and is in NAD  Lungs: Clear with good BS bilat  Heart: RRR   Abdomen: Soft, nontender  Extremities:  Lower extremities - No pretibial edema. No lesions.  Neuro: MS is good with appropriate affect, pt is alert and Ox3    DM foot exam: 08/25/2023  The skin of the feet is intact without sores or ulcerations. The pedal pulses are 2+ on right and 2+ on left. The sensation is intact to a screening 5.07, 10 gram monofilament bilaterally    DATA REVIEWED:  Lab Results  Component Value Date   HGBA1C 7.3 (A) 08/25/2023   HGBA1C 6.7 (A) 02/21/2023   HGBA1C 6.5 (A) 08/21/2022    Latest Reference Range & Units 10/15/23 16:14  COMPREHENSIVE METABOLIC PANEL  Rpt !  Sodium 135 - 145 mEq/L 137  Potassium 3.5 - 5.1 mEq/L 4.2  Chloride 96 - 112 mEq/L 105  CO2 19 - 32 mEq/L 23  Glucose 70 - 99 mg/dL 119 (H)  BUN 6 - 23 mg/dL 23  Creatinine 1.47 - 8.29 mg/dL 5.62  Calcium 8.4 - 13.0 mg/dL 9.1  Alkaline Phosphatase 39 - 117 U/L 63  Albumin 3.5 - 5.2 g/dL 3.4 (L)  AST 0 - 37 U/L 13  ALT 0 - 35 U/L 16  Total Protein 6.0 - 8.3 g/dL 7.0  Total Bilirubin 0.2 - 1.2 mg/dL 0.4  GFR >86.57 mL/min 55.48 (L)    Latest Reference Range & Units 06/30/23 10:35  Total CHOL/HDL Ratio RATIO 5.0  Cholesterol 0 - 200  mg/dL 846 (H)  HDL Cholesterol >40 mg/dL 48  LDL (calc) 0 - 99 mg/dL 962 (H)  (H): Data is abnormally high   In office BG 92 Mg/DL  ASSESSMENT / PLAN / RECOMMENDATIONS:   1) Type 2 Diabetes Mellitus, Optimally controlled, With macrovascular complications - Most recent A1c of 6.4 %. Goal A1c < 7.0 %.     -A1c at goal -Tolerating medications without side effects, no change  MEDICATIONS:  Continue Jardiance 25 mg daily Continue Ozempic 2 mg weekly    EDUCATION / INSTRUCTIONS: BG monitoring instructions: Patient is instructed to check her blood sugars 3 times a week, before meals.  2) Diabetic complications:  Eye: Does not have known diabetic retinopathy.  Neuro/ Feet: Does not have known diabetic peripheral neuropathy. Renal: Patient does not have known baseline CKD. She is  on an ACEI/ARB at present.  3) Dyslipidemia/CAD :   -This is usually followed by cardiology -Her LDL in September, 2024 was elevated 167 Mg/DL, patient is on rosuvastatin, patient does have Repatha on her medication list but she is not taking it, patient endorses skepticism regarding side effects -We did discuss the increased risk of CVA/CAD with elevated LDL, and I have encouraged the patient to consider starting Repatha as the benefit outweigh the risk at this time    Follow-up in 6 months  Signed electronically by: Lyndle Herrlich, MD  [facility] Endocrinology  [facility] [address] [place] [place], [place] [address] Phone: [phone] FAX: [phone]   CC: Sandford Craze, NP [address] [place] [address] Phone: [phone]  Fax: [phone]    Return to Endocrinology clinic as below: Future Appointments  Date Time Provider Department  Center  02/03/2024  9:40 AM Bensimhon, Bevelyn Buckles, MD [facility] None

## 2023-12-24 ENCOUNTER — Other Ambulatory Visit (HOSPITAL_BASED_OUTPATIENT_CLINIC_OR_DEPARTMENT_OTHER): Payer: Self-pay

## 2023-12-25 ENCOUNTER — Other Ambulatory Visit (HOSPITAL_BASED_OUTPATIENT_CLINIC_OR_DEPARTMENT_OTHER): Payer: Self-pay

## 2023-12-26 ENCOUNTER — Other Ambulatory Visit (HOSPITAL_BASED_OUTPATIENT_CLINIC_OR_DEPARTMENT_OTHER): Payer: Self-pay

## 2023-12-29 ENCOUNTER — Other Ambulatory Visit (HOSPITAL_BASED_OUTPATIENT_CLINIC_OR_DEPARTMENT_OTHER): Payer: Self-pay

## 2023-12-30 ENCOUNTER — Other Ambulatory Visit (HOSPITAL_BASED_OUTPATIENT_CLINIC_OR_DEPARTMENT_OTHER): Payer: Self-pay

## 2023-12-31 ENCOUNTER — Other Ambulatory Visit (HOSPITAL_BASED_OUTPATIENT_CLINIC_OR_DEPARTMENT_OTHER): Payer: Self-pay

## 2024-01-01 ENCOUNTER — Other Ambulatory Visit (HOSPITAL_BASED_OUTPATIENT_CLINIC_OR_DEPARTMENT_OTHER): Payer: Self-pay

## 2024-01-02 ENCOUNTER — Other Ambulatory Visit (HOSPITAL_BASED_OUTPATIENT_CLINIC_OR_DEPARTMENT_OTHER): Payer: Self-pay

## 2024-01-05 ENCOUNTER — Other Ambulatory Visit (HOSPITAL_BASED_OUTPATIENT_CLINIC_OR_DEPARTMENT_OTHER): Payer: Self-pay

## 2024-01-06 ENCOUNTER — Other Ambulatory Visit (HOSPITAL_BASED_OUTPATIENT_CLINIC_OR_DEPARTMENT_OTHER): Payer: Self-pay

## 2024-01-07 ENCOUNTER — Other Ambulatory Visit (HOSPITAL_BASED_OUTPATIENT_CLINIC_OR_DEPARTMENT_OTHER): Payer: Self-pay

## 2024-01-08 ENCOUNTER — Other Ambulatory Visit (HOSPITAL_BASED_OUTPATIENT_CLINIC_OR_DEPARTMENT_OTHER): Payer: Self-pay

## 2024-01-09 ENCOUNTER — Other Ambulatory Visit (HOSPITAL_BASED_OUTPATIENT_CLINIC_OR_DEPARTMENT_OTHER): Payer: Self-pay

## 2024-01-12 ENCOUNTER — Other Ambulatory Visit (HOSPITAL_BASED_OUTPATIENT_CLINIC_OR_DEPARTMENT_OTHER): Payer: Self-pay

## 2024-01-13 ENCOUNTER — Other Ambulatory Visit (HOSPITAL_COMMUNITY): Payer: Self-pay

## 2024-01-13 ENCOUNTER — Other Ambulatory Visit (HOSPITAL_BASED_OUTPATIENT_CLINIC_OR_DEPARTMENT_OTHER): Payer: Self-pay

## 2024-01-13 ENCOUNTER — Telehealth: Payer: Self-pay | Admitting: Pharmacy Technician

## 2024-01-13 NOTE — Telephone Encounter (Signed)
 Pharmacy Patient Advocate Encounter   Received notification from CoverMyMeds that prior authorization for Ozempic (2 MG/DOSE) 8MG /3ML pen-injectors is required/requested.   Insurance verification completed.   The patient is insured through [facility] .   Per test claim: PA required; PA submitted to above mentioned insurance via CoverMyMeds Key/confirmation #/EOC [facility] Status is pending

## 2024-01-14 ENCOUNTER — Other Ambulatory Visit (HOSPITAL_COMMUNITY): Payer: Self-pay

## 2024-01-14 ENCOUNTER — Other Ambulatory Visit (HOSPITAL_BASED_OUTPATIENT_CLINIC_OR_DEPARTMENT_OTHER): Payer: Self-pay

## 2024-01-14 NOTE — Telephone Encounter (Signed)
 Pharmacy Patient Advocate Encounter  Received notification from [facility] that Prior Authorization for Ozempic (2 MG/DOSE) 8MG /3ML pen-injectors has been APPROVED from 01/13/2024 to 01/12/2025. Ran test claim, Copay is $15.00. This test claim was processed through [facility]- copay amounts may vary at other pharmacies due to pharmacy/plan contracts, or as the patient moves through the different stages of their insurance plan.  **insurance only pays for 1 month at a time.**   PA #/Case ID/Reference #: [id number]

## 2024-01-15 ENCOUNTER — Other Ambulatory Visit (HOSPITAL_BASED_OUTPATIENT_CLINIC_OR_DEPARTMENT_OTHER): Payer: Self-pay

## 2024-01-16 ENCOUNTER — Other Ambulatory Visit (HOSPITAL_BASED_OUTPATIENT_CLINIC_OR_DEPARTMENT_OTHER): Payer: Self-pay

## 2024-01-16 ENCOUNTER — Other Ambulatory Visit: Payer: Self-pay

## 2024-01-19 ENCOUNTER — Other Ambulatory Visit (HOSPITAL_BASED_OUTPATIENT_CLINIC_OR_DEPARTMENT_OTHER): Payer: Self-pay

## 2024-01-20 ENCOUNTER — Other Ambulatory Visit (HOSPITAL_BASED_OUTPATIENT_CLINIC_OR_DEPARTMENT_OTHER): Payer: Self-pay

## 2024-01-22 ENCOUNTER — Other Ambulatory Visit (HOSPITAL_BASED_OUTPATIENT_CLINIC_OR_DEPARTMENT_OTHER): Payer: Self-pay

## 2024-01-23 ENCOUNTER — Other Ambulatory Visit (HOSPITAL_BASED_OUTPATIENT_CLINIC_OR_DEPARTMENT_OTHER): Payer: Self-pay

## 2024-01-23 ENCOUNTER — Other Ambulatory Visit: Payer: Self-pay

## 2024-01-26 ENCOUNTER — Other Ambulatory Visit (HOSPITAL_BASED_OUTPATIENT_CLINIC_OR_DEPARTMENT_OTHER): Payer: Self-pay

## 2024-01-27 ENCOUNTER — Other Ambulatory Visit (HOSPITAL_BASED_OUTPATIENT_CLINIC_OR_DEPARTMENT_OTHER): Payer: Self-pay

## 2024-01-28 ENCOUNTER — Other Ambulatory Visit (HOSPITAL_BASED_OUTPATIENT_CLINIC_OR_DEPARTMENT_OTHER): Payer: Self-pay

## 2024-01-29 ENCOUNTER — Other Ambulatory Visit (HOSPITAL_BASED_OUTPATIENT_CLINIC_OR_DEPARTMENT_OTHER): Payer: Self-pay

## 2024-02-02 ENCOUNTER — Other Ambulatory Visit (HOSPITAL_BASED_OUTPATIENT_CLINIC_OR_DEPARTMENT_OTHER): Payer: Self-pay

## 2024-02-03 ENCOUNTER — Telehealth (HOSPITAL_COMMUNITY): Payer: Self-pay

## 2024-02-03 ENCOUNTER — Encounter (HOSPITAL_COMMUNITY): Admitting: Internal Medicine

## 2024-02-03 ENCOUNTER — Other Ambulatory Visit (HOSPITAL_BASED_OUTPATIENT_CLINIC_OR_DEPARTMENT_OTHER): Payer: Self-pay

## 2024-02-03 NOTE — Telephone Encounter (Signed)
 Called to confirm/remind patient of their appointment at the [facility] on 02/04/2024 3:30.   Appointment:   [x] Confirmed  [] Left mess   [] No answer/No voice mail  [] VM Full/unable to leave message  [] Phone not in service  Patient reminded to bring all medications and/or complete list.  Confirmed patient has transportation. Gave directions, instructed to utilize valet parking.

## 2024-02-04 ENCOUNTER — Encounter (HOSPITAL_COMMUNITY): Payer: Self-pay

## 2024-02-04 ENCOUNTER — Ambulatory Visit (HOSPITAL_COMMUNITY)
Admission: RE | Admit: 2024-02-04 | Discharge: 2024-02-04 | Disposition: A | Discharge status: Home / Self Care | Source: Ambulatory Visit | Attending: Physician Assistant | Admitting: Physician Assistant

## 2024-02-04 ENCOUNTER — Other Ambulatory Visit (HOSPITAL_BASED_OUTPATIENT_CLINIC_OR_DEPARTMENT_OTHER): Payer: Self-pay

## 2024-02-04 VITALS — BP 168/104 | HR 89 | Ht 65.0 in | Wt 274.8 lb

## 2024-02-04 DIAGNOSIS — I428 Other cardiomyopathies: Secondary | ICD-10-CM | POA: Diagnosis not present

## 2024-02-04 DIAGNOSIS — Z7985 Long-term (current) use of injectable non-insulin antidiabetic drugs: Secondary | ICD-10-CM | POA: Diagnosis not present

## 2024-02-04 DIAGNOSIS — E669 Obesity, unspecified: Secondary | ICD-10-CM | POA: Diagnosis not present

## 2024-02-04 DIAGNOSIS — Z7982 Long term (current) use of aspirin: Secondary | ICD-10-CM | POA: Insufficient documentation

## 2024-02-04 DIAGNOSIS — G4733 Obstructive sleep apnea (adult) (pediatric): Secondary | ICD-10-CM

## 2024-02-04 DIAGNOSIS — Z7984 Long term (current) use of oral hypoglycemic drugs: Secondary | ICD-10-CM | POA: Insufficient documentation

## 2024-02-04 DIAGNOSIS — Z720 Tobacco use: Secondary | ICD-10-CM | POA: Diagnosis not present

## 2024-02-04 DIAGNOSIS — Z7902 Long term (current) use of antithrombotics/antiplatelets: Secondary | ICD-10-CM | POA: Insufficient documentation

## 2024-02-04 DIAGNOSIS — Z5986 Financial insecurity: Secondary | ICD-10-CM | POA: Diagnosis not present

## 2024-02-04 DIAGNOSIS — I10 Essential (primary) hypertension: Secondary | ICD-10-CM | POA: Diagnosis not present

## 2024-02-04 DIAGNOSIS — Z79899 Other long term (current) drug therapy: Secondary | ICD-10-CM | POA: Diagnosis not present

## 2024-02-04 DIAGNOSIS — I110 Hypertensive heart disease with heart failure: Secondary | ICD-10-CM | POA: Diagnosis not present

## 2024-02-04 DIAGNOSIS — I2510 Atherosclerotic heart disease of native coronary artery without angina pectoris: Secondary | ICD-10-CM | POA: Diagnosis not present

## 2024-02-04 DIAGNOSIS — Z955 Presence of coronary angioplasty implant and graft: Secondary | ICD-10-CM | POA: Diagnosis not present

## 2024-02-04 DIAGNOSIS — E119 Type 2 diabetes mellitus without complications: Secondary | ICD-10-CM | POA: Insufficient documentation

## 2024-02-04 DIAGNOSIS — I5022 Chronic systolic (congestive) heart failure: Secondary | ICD-10-CM | POA: Diagnosis not present

## 2024-02-04 DIAGNOSIS — Z6841 Body Mass Index (BMI) 40.0 and over, adult: Secondary | ICD-10-CM | POA: Diagnosis not present

## 2024-02-04 DIAGNOSIS — F17210 Nicotine dependence, cigarettes, uncomplicated: Secondary | ICD-10-CM | POA: Insufficient documentation

## 2024-02-04 LAB — BASIC METABOLIC PANEL WITH GFR
Anion gap: 8 (ref 5–15)
BUN: 15 mg/dL (ref 6–20)
CO2: 25 mmol/L (ref 22–32)
Calcium: 9 mg/dL (ref 8.9–10.3)
Chloride: 107 mmol/L (ref 98–111)
Creatinine, Ser: 0.83 mg/dL (ref 0.44–1.00)
GFR, Estimated: >60 mL/min (ref >60)
Glucose, Bld: 95 mg/dL (ref 70–99)
Potassium: 3.7 mmol/L (ref 3.5–5.1)
Sodium: 140 mmol/L (ref 135–145)

## 2024-02-04 LAB — BRAIN NATRIURETIC PEPTIDE: B Natriuretic Peptide: 160.1 pg/mL — ABNORMAL HIGH (ref 0.0–100.0)

## 2024-02-04 MED ORDER — CARVEDILOL 25 MG PO TABS: 25.0000 mg | ORAL_TABLET | Freq: Two times a day (BID) | ORAL | 5 refills | Status: DC

## 2024-02-04 NOTE — Progress Notes (Addendum)
 [facility] Note   PCP: Dorrene Gaucher, NP HF Cardiologist: Dr. Bensimhon  Reason for visit/CC: Follow up for HF  HPI: Catherine Espinoza is a 48 y.o.female with DM2, HTN, morbid obesity, CAD with previous LAD stent, OSA and systolic HF.  Presented to ED on 10/11/20 for leg swelling. Echo EF 20-25%. R/LHC w/ severe 2 v CAD involving LAD and RCA (s/p PTCA/DES to LAD, RCA treated medically), preserved CO, elevated filling pressures.   Echo 03/12/21 EF 40-45%   Sleep study 5/23 Severe OSA with AHI 47   Echo 06/23: EF 50-55%, grade I DD, RV systolic function not well visualized, RVSP 32 mmHg  Echo 09/24: EF 45-50%, grade II DD, RV okay  Here today for CHF follow-up. Feeling well. No dyspnea, chest pain, orthopnea, PND or lower extremity edema. Has not lost weight with ozempic  despite decreased appetite. 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 Occasionally uses a salt shaker, has not been watching sodium intake much as her appetite has been suppressed. Taking medications as prescribed. Dose of Carvedilol  had been reduced a few months ago for uncertain reasons. Uses CPAP most nights.  No ETOH use. Smokes 1/2 ppd. Not ready to quit.   Cardiac Studies  Echo 09/24:  EF 45-50%, grade II DD, RV okay  Echo 1/22: EF 20-25% with global hypokinesis, mild LVH, grade I DD, RVSP 54, small pericardial effusion and IVC dilated w/ RAP 15.  R/LHC 1/22: RHC/LHC: PCI to LAD Prox RCA lesion is 30% stenosed. RPDA lesion is 95% stenosed. RPAV lesion is 99% stenosed. Prox LAD to Mid LAD lesion is 40% stenosed. Mid LAD lesion is 80% stenosed. 2nd Diag lesion is 50% stenosed.   Findings:  Ao = 147/97 (119) LV =  140/27 RA =  12 RV = 68/15 PA = 72/37 (51) PCW = 34 Fick cardiac output/index = 6.3/2.9 PVR = 2.6 WU FA sat = 97% PA sat = 72%, 72%  Assessment: 1. 2v CAD with high grade lesions in the mid LAD and distal RCA 2. 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 Markedly elevated filling pressures with normal cardiac output  Plan/Discussion: Plan PCI of LAD followed by aggressive diuresis and titration of GDMT  ROS: All systems reviewed and negative except as per HPI.   Past Medical History:  Diagnosis Date   Abscess of right hand 07/13/2021   Anemia, unspecified    CAD (coronary artery disease)    Chronic left-sided thoracic back pain    Diabetes mellitus type 2 with complications (HCC)    Diabetic retinopathy (HCC)    Excessive or frequent menstruation    Morbid obesity (HCC)    Obstructive sleep apnea 02/11/2011   Sleep study: severe OSA- rec CPAP 20cm small  full face mask   Proteinuria    Thoracic radiculopathy    Unspecified essential hypertension     Current Outpatient Medications  Medication Sig Dispense Refill   acetaminophen  (TYLENOL ) 500 MG tablet Take 1,000 mg by mouth every 6 (six) hours as needed for mild pain or moderate pain.     amLODipine  (NORVASC ) 10 MG tablet Take 1 tablet (10 mg total) by mouth daily. 90 tablet 0   aspirin  EC 81 MG tablet Take 81 mg by mouth daily. Swallow whole.     blood glucose meter kit and supplies KIT Dispense based on patient and insurance preference. Use up to four times daily as directed. (FOR  ICD-9 250.00, 250.01). 1 each 0   clindamycin  (CLEOCIN -T) 1 % lotion Apply 1 application to affected areas daily. 60 mL 2   empagliflozin  (JARDIANCE ) 25 MG TABS tablet Take 1 tablet (25 mg total) by mouth daily before breakfast. 90 tablet 3   Evolocumab  (REPATHA  SURECLICK) 140 MG/ML SOAJ Inject 140 mg into the skin every 14 (fourteen) days. 2 mL 11   furosemide  (LASIX ) 40 MG tablet Take 1 tablet (40 mg total) by mouth daily. 90 tablet 3   hydrALAZINE  (APRESOLINE ) 25 MG tablet Take 1 tablet (25 mg total) by mouth in the morning and at bedtime. 60 tablet 2   Insulin  Pen Needle 32G X 6 MM MISC Use as directed once daily in the afternoon 100 each 3   Insulin  Syringe 27G  X 1/2" 0.5 ML MISC Use as directed 100 each 3   omeprazole  (PRILOSEC) 20 MG capsule Take 1 capsule (20 mg total) by mouth daily. 90 capsule 3   rosuvastatin  (CRESTOR ) 40 MG tablet Take 1 tablet (40 mg total) by mouth daily. 90 tablet 3   sacubitril -valsartan  (ENTRESTO ) 97-103 MG Take 1 tablet by mouth 2 (two) times daily. 60 tablet 11   Semaglutide , 2 MG/DOSE, 8 MG/3ML SOPN Inject 2 mg as directed once a week. 9 mL 3   spironolactone  (ALDACTONE ) 25 MG tablet TAKE 1 TABLET (25 MG TOTAL) BY MOUTH DAILY. 90 tablet 3   carvedilol  (COREG ) 25 MG tablet Take 1 tablet (25 mg total) by mouth 2 (two) times daily with a meal. 60 tablet 5   No current facility-administered medications for this encounter.    Allergies  Allergen Reactions   Ibuprofen     REACTION: knots in mouth with SOB   Labetalol  Nausea Only   Aspirin  Other (See Comments)    Knots in mouth/ Higher doses     Social History   Socioeconomic History   Marital status: Single    Spouse name: Not on file   Number of children: 1   Years of education: Not on file   Highest education level: Associate degree: occupational, Scientist, product/process development, or vocational program  Occupational History   Occupation: cna/med Theme park manager: [employer]  Tobacco Use   Smoking status: Every Day    Current packs/day: 0.50    Average packs/day: 0.5 packs/day for 10.0 years (5.0 ttl pk-yrs)    Types: Cigarettes   Smokeless tobacco: Never  Vaping Use   Vaping status: Never Used  Substance and Sexual Activity   Alcohol use: Yes    Alcohol/week: 0.0 standard drinks of alcohol    Comment: occasional use   Drug use: No   Sexual activity: Yes    Birth control/protection: Surgical  Other Topics Concern   Not on file  Social History Narrative   Married-filing for divorced   Daughter born 2003   Works as cna/med [employer]   Social Drivers of Health   Financial Resource Strain: High Risk (10/12/2020)   Overall Financial Resource Strain (CARDIA)     Difficulty of Paying Living Expenses: Hard  Food Insecurity: No Food Insecurity (10/12/2020)   Hunger Vital Sign    Worried About Running Out of Food in the Last Year: Never true    Ran Out of Food in the Last Year: Never true  Transportation Needs: No Transportation Needs (10/12/2020)   PRAPARE - Administrator, Civil Service (Medical): No    Lack of Transportation (Non-Medical): No  Physical Activity: Not on file  Stress: Not on file  Social Connections: Unknown (02/08/2022)   [facility]   Social Network    Social Network: Not on file  Intimate Partner Violence: Unknown (01/01/2022)   [facility] from [facility]   HITS    Physically Hurt: Not on file    Insult or Talk Down To: Not on file    Threaten Physical Harm: Not on file    Scream or Curse: Not on file   Family History  Problem Relation Age of Onset   Heart attack Maternal Aunt    Diabetes Mother    Cancer Father        oral cancer   Vitals:   02/04/24 1526 02/04/24 1621  BP: (!) 200/110 (!) 168/104  Pulse: 89   SpO2: 96%   Weight: 124.6 kg (274 lb 12.8 oz)   Height: 5\' 5"  (1.651 m)        Wt Readings from Last 3 Encounters:  02/04/24 124.6 kg (274 lb 12.8 oz)  12/23/23 124.3 kg (274 lb)  10/15/23 124.3 kg (274 lb)   PHYSICAL EXAM: General:  Well appearing.  Neck: no JVD.  Cor: Regular rate & rhythm. No rubs, gallops or murmurs. Lungs: clear Abdomen: obese, soft, nontender, nondistended.  Extremities: no edema Neuro: alert & orientedx3. Affect pleasant   ASSESSMENT & PLAN:  1.  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 Residual RCA disease treated medically - Echo 03/12/21 EF 40-45% - Echo 06/23: EF 50-55% - Echo 09/24: EF 45-50%, RV okay - NYHA I-II - Volume looks good. Continue lasix  40 mg daily -  Increase Coreg  back to 25 mg daily - Continue spiro 25 mg daily. - Continue Farxiga  10 mg daily. No GU symptoms. - Continue Entresto  97/103 mg bid. - Did not tolerate Bidil  due to lightheadedness. On hydralazine  alone for blood pressure. - Recheck echo at f/u in 2 months  2. CAD - S/P PCI LAD 1/22, had severe distal RCA disease treated medically - On Aspirin  + crestor  + plavix .  - No angina. - Lipids being managed by Lipid Clinic - Stressed importance of smoking cessation, DM2 control and weight loss.  3. DM2 - Last A1c 6.4% (03/25) - On semaglutide  and farxiga  - following with endocrine   4. Hypertension - BP elevated. Increased coreg  back to prior dose, 25 mg BID. - OSA and obesity likely contributing - Discussed benefits of regular exercise and low sodium diet in managing HTN - Meds as above   5. Obesity - Body mass index is 45.73 kg/m. - Struggling to lose weight with ozempic  - Managed by PCP  6. Tobacco use - Smoking 1/2 ppd - Discussed cessation. Not ready to quit.  7. OSA - Severe. AHI 47 - Using CPAP most nights.  8. Hyperlipidenmia - On crestor  40 mg daily - Stopped Repatha  and lost to follow-up with lipid clinic, last LDL 167. Repatha  has since been restarted.  Follow-up: 2 months with APP with echo. If LV function remains stable can graduate from HF clinic (previously suggested by Dr. Julane Ny)  [facility], Angelena Barber, PA-C 02/04/24

## 2024-02-04 NOTE — Patient Instructions (Addendum)
 Medication Changes:  INCREASE CARVEDILOL  TO 25MG  TWICE DAILY   Lab Work:  Labs done today, your results will be available in MyChart, we will contact you for abnormal readings.  PLEASE CHECK YOUR BLOOD PRESSURE DAILY AT HOME AND PLEASE CALL US  [phone] OPT 2 IF BLOOD PRESSURE IS GREATER THAN 120 ON THE TOP NUMBER   Follow-Up in: 2 MONTHS WITH AN ECHO AS SCHEDULED   At the [facility], you and your health needs are our priority. We have a designated team specialized in the treatment of Heart Failure. This Care Team includes your primary Heart Failure Specialized Cardiologist (physician), Advanced Practice Providers (APPs- Physician Assistants and Nurse Practitioners), and Pharmacist who all work together to provide you with the care you need, when you need it.   You may see any of the following providers on your designated Care Team at your next follow up:  Dr. Jules Oar Dr. Peder Bourdon Dr. Alwin Baars Dr. Judyth Nunnery Nieves Bars, NP Ruddy Corral, [place] [facility] [place], [place] Dennise Fitz, NP [place] Lee, NP Luster Salters, PharmD   Please be sure to bring in all your medications bottles to every appointment.   Need to Contact Us :  If you have any questions or concerns before your next appointment please send us  a message through [place] or call our office at [phone].    TO LEAVE A MESSAGE FOR THE NURSE SELECT OPTION 2, PLEASE LEAVE A MESSAGE INCLUDING: YOUR NAME DATE OF BIRTH CALL BACK NUMBER REASON FOR CALL**this is important as we prioritize the call backs  YOU WILL RECEIVE A CALL BACK THE SAME DAY AS LONG AS YOU CALL BEFORE 4:00 PM

## 2024-02-09 ENCOUNTER — Other Ambulatory Visit (HOSPITAL_BASED_OUTPATIENT_CLINIC_OR_DEPARTMENT_OTHER): Payer: Self-pay

## 2024-02-10 ENCOUNTER — Other Ambulatory Visit (HOSPITAL_BASED_OUTPATIENT_CLINIC_OR_DEPARTMENT_OTHER): Payer: Self-pay

## 2024-02-11 ENCOUNTER — Other Ambulatory Visit (HOSPITAL_BASED_OUTPATIENT_CLINIC_OR_DEPARTMENT_OTHER): Payer: Self-pay

## 2024-02-11 ENCOUNTER — Other Ambulatory Visit: Payer: Self-pay

## 2024-02-12 ENCOUNTER — Other Ambulatory Visit (HOSPITAL_BASED_OUTPATIENT_CLINIC_OR_DEPARTMENT_OTHER): Payer: Self-pay

## 2024-02-13 ENCOUNTER — Other Ambulatory Visit (HOSPITAL_BASED_OUTPATIENT_CLINIC_OR_DEPARTMENT_OTHER): Payer: Self-pay

## 2024-02-16 ENCOUNTER — Other Ambulatory Visit (HOSPITAL_BASED_OUTPATIENT_CLINIC_OR_DEPARTMENT_OTHER): Payer: Self-pay

## 2024-02-17 ENCOUNTER — Other Ambulatory Visit (HOSPITAL_BASED_OUTPATIENT_CLINIC_OR_DEPARTMENT_OTHER): Payer: Self-pay

## 2024-02-19 ENCOUNTER — Other Ambulatory Visit (HOSPITAL_BASED_OUTPATIENT_CLINIC_OR_DEPARTMENT_OTHER): Payer: Self-pay

## 2024-02-20 ENCOUNTER — Other Ambulatory Visit (HOSPITAL_BASED_OUTPATIENT_CLINIC_OR_DEPARTMENT_OTHER): Payer: Self-pay

## 2024-02-22 ENCOUNTER — Other Ambulatory Visit: Payer: Self-pay

## 2024-03-22 ENCOUNTER — Other Ambulatory Visit (HOSPITAL_BASED_OUTPATIENT_CLINIC_OR_DEPARTMENT_OTHER): Payer: Self-pay

## 2024-03-22 ENCOUNTER — Other Ambulatory Visit (HOSPITAL_COMMUNITY): Payer: Self-pay

## 2024-03-22 ENCOUNTER — Telehealth (INDEPENDENT_AMBULATORY_CARE_PROVIDER_SITE_OTHER): Payer: Self-pay | Admitting: Pharmacy Technician

## 2024-03-22 NOTE — Telephone Encounter (Signed)
 Pharmacy Patient Advocate Encounter   Received notification from CoverMyMeds that prior authorization for Ozempic  (2 MG/DOSE) 8MG /3ML pen-injectors is required/requested.   Insurance verification completed.   The patient is insured through [facility] .   Per test claim: PA required; PA submitted to above mentioned insurance via CoverMyMeds Key/confirmation #/EOC [id number] Status is pending   **resubmitted for 3 months supply.**

## 2024-03-22 NOTE — Telephone Encounter (Signed)
 Pharmacy Patient Advocate Encounter  Received notification from [facility] that Prior Authorization for Ozempic  (2 MG/DOSE) 8MG /3ML pen-injectors has been CANCELLED due to    PA #/Case ID/Reference #: [id number]

## 2024-04-05 ENCOUNTER — Telehealth (HOSPITAL_COMMUNITY): Payer: Self-pay | Admitting: *Deleted

## 2024-04-05 NOTE — Telephone Encounter (Signed)
 Called to confirm/remind patient of their appointment at the [facility] on 04/05/24.  Pt will arrive one hour early for echocardiogram.       Appointment:              [x] Confirmed             [] Left mess              [] No answer/No voice mail             [] Phone not in service   Patient reminded to bring all medications and/or complete list.   Confirmed patient has transportation. Gave directions, instructed to utilize valet parking.

## 2024-04-05 NOTE — Progress Notes (Addendum)
 Advanced Heart Failure Clinic Note   PCP: Daryl Setter, NP HF Cardiologist: Dr. Cherrie  HPI: Catherine Espinoza is a 48 y.o. female with DM2, HTN, morbid obesity, CAD with previous LAD stent, OSA and chronic systolic HF.  Presented to ED on 10/11/20 for leg swelling. Echo EF 20-25%. R/LHC w/ severe 2 v CAD involving LAD and RCA (s/p PTCA/DES to LAD, RCA treated medically), preserved CO, elevated filling pressures.   Echo 03/12/21 EF 40-45%   Sleep study 5/23 Severe OSA with AHI 47   Echo 06/23: EF 50-55%, grade I DD, RV systolic function not well visualized, RVSP 32 mmHg  Echo 09/24: EF 45-50%, grade II DD, RV okay  Today she returns for HF follow up. Overall feeling fine. She has SOB walking further distances on flat ground. Feels high when she takes her medications. Denies palpitations, abnormal bleeding, CP, dizziness, edema, or PND/Orthopnea. Appetite ok. Weight at home 270 pounds. Taking all medications, not on Repatha . Uses CPAP most nights. No ETOH use. Smokes 1/2 ppd. Not ready to quit. Works as a Engineer, materials at [employer], 8 hr shifts. Mother lives with her. Following heart healthy diet.  Echo today 04/06/24, EF 45-50%.  Cardiac Studies Echo 9/24:  EF 45-50%, grade II DD, RV okay  Echo 1/22: EF 20-25% with global hypokinesis, mild LVH, grade I DD, RVSP 54, small pericardial effusion and IVC dilated w/ RAP 15.  R/LHC 1/22: RHC/LHC: PCI to LAD Prox RCA lesion is 30% stenosed. RPDA lesion is 95% stenosed. RPAV lesion is 99% stenosed. Prox LAD to Mid LAD lesion is 40% stenosed. Mid LAD lesion is 80% stenosed. 2nd Diag lesion is 50% stenosed.   Findings:  Ao = 147/97 (119) LV =  140/27 RA =  12 RV = 68/15 PA = 72/37 (51) PCW = 34 Fick cardiac output/index = 6.3/2.9 PVR = 2.6 WU FA sat = 97% PA sat = 72%, 72%  Assessment: 1. 2v CAD with high grade lesions in the mid LAD and distal RCA 2. 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 Markedly elevated  filling pressures with normal cardiac output  Plan/Discussion: Plan PCI of LAD followed by aggressive diuresis and titration of GDMT  ROS: All systems reviewed and negative except as per HPI.   Past Medical History:  Diagnosis Date   Abscess of right hand 07/13/2021   Anemia, unspecified    CAD (coronary artery disease)    Chronic left-sided thoracic back pain    Diabetes mellitus type 2 with complications (HCC)    Diabetic retinopathy (HCC)    Excessive or frequent menstruation    Morbid obesity (HCC)    Obstructive sleep apnea 02/11/2011   Sleep study: severe OSA- rec CPAP 20cm small  full face mask   Proteinuria    Thoracic radiculopathy    Unspecified essential hypertension     Current Outpatient Medications  Medication Sig Dispense Refill   acetaminophen  (TYLENOL ) 500 MG tablet Take 1,000 mg by mouth every 6 (six) hours as needed for mild pain or moderate pain.     amLODipine  (NORVASC ) 10 MG tablet Take 1 tablet (10 mg total) by mouth daily. 90 tablet 0   aspirin  EC 81 MG tablet Take 81 mg by mouth daily. Swallow whole.     blood glucose meter kit and supplies KIT Dispense based on patient and insurance preference. Use up to four times daily as directed. (FOR ICD-9 250.00, 250.01). 1 each 0   carvedilol  (COREG ) 25 MG tablet Take  1 tablet (25 mg total) by mouth 2 (two) times daily with a meal. 60 tablet 5   clindamycin  (CLEOCIN -T) 1 % lotion Apply 1 application to affected areas daily. 60 mL 2   empagliflozin  (JARDIANCE ) 25 MG TABS tablet Take 1 tablet (25 mg total) by mouth daily before breakfast. 90 tablet 3   Evolocumab  (REPATHA  SURECLICK) 140 MG/ML SOAJ Inject 140 mg into the skin every 14 (fourteen) days. 2 mL 11   furosemide  (LASIX ) 40 MG tablet Take 1 tablet (40 mg total) by mouth daily. 90 tablet 3   hydrALAZINE  (APRESOLINE ) 25 MG tablet Take 1 tablet (25 mg total) by mouth in the morning and at bedtime. 60 tablet 2   Insulin  Pen Needle 32G X 6 MM MISC Use as directed  once daily in the afternoon 100 each 3   Insulin  Syringe 27G X 1/2 0.5 ML MISC Use as directed 100 each 3   omeprazole  (PRILOSEC) 20 MG capsule Take 1 capsule (20 mg total) by mouth daily. 90 capsule 3   rosuvastatin  (CRESTOR ) 40 MG tablet Take 1 tablet (40 mg total) by mouth daily. 90 tablet 3   sacubitril -valsartan  (ENTRESTO ) 97-103 MG Take 1 tablet by mouth 2 (two) times daily. 60 tablet 11   Semaglutide , 2 MG/DOSE, 8 MG/3ML SOPN Inject 2 mg as directed once a week. 9 mL 3   spironolactone  (ALDACTONE ) 25 MG tablet TAKE 1 TABLET (25 MG TOTAL) BY MOUTH DAILY. 90 tablet 3   No current facility-administered medications for this encounter.    Allergies  Allergen Reactions   Ibuprofen     REACTION: knots in mouth with SOB   Labetalol  Nausea Only   Aspirin  Other (See Comments)    Knots in mouth/ Higher doses     Social History   Socioeconomic History   Marital status: Single    Spouse name: Not on file   Number of children: 1   Years of education: Not on file   Highest education level: Associate degree: occupational, Scientist, product/process development, or vocational program  Occupational History   Occupation: cna/med Theme park manager: [employer]  Tobacco Use   Smoking status: Every Day    Current packs/day: 0.50    Average packs/day: 0.5 packs/day for 10.0 years (5.0 ttl pk-yrs)    Types: Cigarettes   Smokeless tobacco: Never  Vaping Use   Vaping status: Never Used  Substance and Sexual Activity   Alcohol use: Yes    Alcohol/week: 0.0 standard drinks of alcohol    Comment: occasional use   Drug use: No   Sexual activity: Yes    Birth control/protection: Surgical  Other Topics Concern   Not on file  Social History Narrative   Married-filing for divorced   Daughter born 2003   Works as cna/med [employer]   Social Drivers of Health   Financial Resource Strain: High Risk (10/12/2020)   Overall Financial Resource Strain (CARDIA)    Difficulty of Paying Living Expenses: Hard  Food Insecurity:  No Food Insecurity (10/12/2020)   Hunger Vital Sign    Worried About Running Out of Food in the Last Year: Never true    Ran Out of Food in the Last Year: Never true  Transportation Needs: No Transportation Needs (10/12/2020)   PRAPARE - Administrator, Civil Service (Medical): No    Lack of Transportation (Non-Medical): No  Physical Activity: Not on file  Stress: Not on file  Social Connections: Unknown (02/08/2022)   Received from [facility]   Social Network    Social Network: Not on file  Intimate Partner Violence: Unknown (01/01/2022)   Received from [facility]   HITS    Physically Hurt: Not on file    Insult or Talk Down To: Not on file    Threaten Physical Harm: Not on file    Scream or Curse: Not on file   Family History  Problem Relation Age of Onset   Heart attack Maternal Aunt    Diabetes Mother    Cancer Father        oral cancer    Wt Readings from Last 3 Encounters:  04/06/24 123.2 kg (271 lb 9.6 oz)  02/04/24 124.6 kg (274 lb 12.8 oz)  12/23/23 124.3 kg (274 lb)   BP (!) 164/100   Pulse 89   Wt 123.2 kg (271 lb 9.6 oz)   LMP 09/13/2011   SpO2 93%   BMI 45.20 kg/m   PHYSICAL EXAM: General:  NAD. No resp difficulty, walked into clinic HEENT: Normal Neck: Supple. No JVD. Cor: Regular rate & rhythm. No rubs, gallops or murmurs. Lungs: Clear Abdomen: Soft, obese, nontender, nondistended.  Extremities: No cyanosis, clubbing, rash, edema Neuro: Alert & oriented x 3, moves all 4 extremities w/o difficulty. Affect pleasant.   ASSESSMENT & PLAN:  1.  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 Residual RCA disease treated medically - Echo 03/12/21 EF 40-45% - Echo 06/23: EF 50-55% - Echo 09/24: EF 45-50%, RV okay - NYHA II. Volume looks good.  - Increase hydralazine  to 50 mg tid (dizzy on BiDil ) -  Continue Lasix  40 mg daily - Continue Coreg  25 mg bid - Continue spiro 25 mg daily. - Continue Jardiance  25 mg daily. - Continue Entresto  97/103 mg bid. - Echo today EF stable, 45-50% - Labs today.  2. CAD - S/P PCI LAD 1/22, had severe distal RCA disease treated medically - No chest pain - Continue ASA  - Continue statin (see # 8) - Stressed importance of smoking cessation, DM2 control and weight loss.  3. Uncontrolled HTN - BP not controlled - Increase hydralazine  to 50 mg tid  - Continue amlodipine  10 mg daily. - Refer to HTN Clinic - Arrange renal artery dopplers - I asked her to check BP daily and log - Continue CPAP and weight loss efforts  4. DM2 - Last A1c 6.4% (03/25) - On semaglutide  and SGLT2i - Following with endocrine  5. Obesity - Body mass index is 45.2 kg/m. - Weight loss have plateaued with max dose semaglutide  - Consider switch to tirzepatide? Endo managing  6. Tobacco use - Smoking 1/2 ppd - Discussed cessation. Failed Chantix  and patches. - Thinking about quitting.   7. OSA - Severe. AHI 47 - Complaint with CPAP  8. HLD - Insurance coverage was a barrier for Repatha  (has been off since 10/2023) - Continue Crestor  40 mg daily. - She has been following heart healthy diet. - Most recent LDL 167 (had been off PCSK9i) - Repeat lipids, needs to get back on Repatha   Follow-up in 6 months with Dr Cherrie. LV function remains stable, consider graduation from HF clinic if OK by MD.  Harlene CHRISTELLA Gainer, FNP 04/06/24

## 2024-04-06 ENCOUNTER — Other Ambulatory Visit (HOSPITAL_BASED_OUTPATIENT_CLINIC_OR_DEPARTMENT_OTHER): Payer: Self-pay

## 2024-04-06 ENCOUNTER — Ambulatory Visit (HOSPITAL_COMMUNITY)
Admission: RE | Admit: 2024-04-06 | Discharge: 2024-04-06 | Disposition: A | Discharge status: Home / Self Care | Source: Ambulatory Visit | Attending: Physician Assistant | Admitting: Physician Assistant

## 2024-04-06 ENCOUNTER — Ambulatory Visit (HOSPITAL_COMMUNITY): Payer: Self-pay | Admitting: Family Medicine

## 2024-04-06 ENCOUNTER — Ambulatory Visit (HOSPITAL_COMMUNITY): Payer: Self-pay | Admitting: Physician Assistant

## 2024-04-06 ENCOUNTER — Encounter (HOSPITAL_COMMUNITY): Payer: Self-pay

## 2024-04-06 ENCOUNTER — Ambulatory Visit (HOSPITAL_BASED_OUTPATIENT_CLINIC_OR_DEPARTMENT_OTHER)
Admission: RE | Admit: 2024-04-06 | Discharge: 2024-04-06 | Disposition: A | Discharge status: Home / Self Care | Source: Ambulatory Visit | Attending: Family Medicine | Admitting: Family Medicine

## 2024-04-06 VITALS — BP 164/100 | HR 89 | Wt 271.6 lb

## 2024-04-06 DIAGNOSIS — I110 Hypertensive heart disease with heart failure: Secondary | ICD-10-CM | POA: Diagnosis not present

## 2024-04-06 DIAGNOSIS — E119 Type 2 diabetes mellitus without complications: Secondary | ICD-10-CM

## 2024-04-06 DIAGNOSIS — I10 Essential (primary) hypertension: Secondary | ICD-10-CM | POA: Diagnosis not present

## 2024-04-06 DIAGNOSIS — Z7985 Long-term (current) use of injectable non-insulin antidiabetic drugs: Secondary | ICD-10-CM | POA: Insufficient documentation

## 2024-04-06 DIAGNOSIS — E785 Hyperlipidemia, unspecified: Secondary | ICD-10-CM

## 2024-04-06 DIAGNOSIS — M7989 Other specified soft tissue disorders: Secondary | ICD-10-CM | POA: Diagnosis not present

## 2024-04-06 DIAGNOSIS — I2510 Atherosclerotic heart disease of native coronary artery without angina pectoris: Secondary | ICD-10-CM

## 2024-04-06 DIAGNOSIS — Z955 Presence of coronary angioplasty implant and graft: Secondary | ICD-10-CM | POA: Diagnosis not present

## 2024-04-06 DIAGNOSIS — I5022 Chronic systolic (congestive) heart failure: Secondary | ICD-10-CM | POA: Diagnosis not present

## 2024-04-06 DIAGNOSIS — I080 Rheumatic disorders of both mitral and aortic valves: Secondary | ICD-10-CM | POA: Diagnosis not present

## 2024-04-06 DIAGNOSIS — F17210 Nicotine dependence, cigarettes, uncomplicated: Secondary | ICD-10-CM | POA: Diagnosis not present

## 2024-04-06 DIAGNOSIS — Z720 Tobacco use: Secondary | ICD-10-CM

## 2024-04-06 DIAGNOSIS — Z7984 Long term (current) use of oral hypoglycemic drugs: Secondary | ICD-10-CM | POA: Insufficient documentation

## 2024-04-06 DIAGNOSIS — I428 Other cardiomyopathies: Secondary | ICD-10-CM | POA: Insufficient documentation

## 2024-04-06 DIAGNOSIS — Z794 Long term (current) use of insulin: Secondary | ICD-10-CM | POA: Insufficient documentation

## 2024-04-06 DIAGNOSIS — Z6841 Body Mass Index (BMI) 40.0 and over, adult: Secondary | ICD-10-CM | POA: Diagnosis not present

## 2024-04-06 DIAGNOSIS — Z79899 Other long term (current) drug therapy: Secondary | ICD-10-CM | POA: Diagnosis not present

## 2024-04-06 DIAGNOSIS — G4733 Obstructive sleep apnea (adult) (pediatric): Secondary | ICD-10-CM | POA: Diagnosis not present

## 2024-04-06 DIAGNOSIS — Z006 Encounter for examination for normal comparison and control in clinical research program: Secondary | ICD-10-CM

## 2024-04-06 LAB — ECHOCARDIOGRAM COMPLETE
AR max vel: 2.11 cm2
AV Area VTI: 2.08 cm2
AV Area mean vel: 2.15 cm2
AV Mean grad: 6 mmHg
AV Peak grad: 10.9 mmHg
Ao pk vel: 1.65 m/s
Calc EF: 42.6 %
S' Lateral: 4.5 cm
Single Plane A2C EF: 38.3 %
Single Plane A4C EF: 46.5 %

## 2024-04-06 LAB — BASIC METABOLIC PANEL WITH GFR
Anion gap: 8 (ref 5–15)
BUN: 15 mg/dL (ref 6–20)
CO2: 25 mmol/L (ref 22–32)
Calcium: 9.3 mg/dL (ref 8.9–10.3)
Chloride: 107 mmol/L (ref 98–111)
Creatinine, Ser: 0.77 mg/dL (ref 0.44–1.00)
GFR, Estimated: >60 mL/min (ref >60)
Glucose, Bld: 108 mg/dL — ABNORMAL HIGH (ref 70–99)
Potassium: 3.9 mmol/L (ref 3.5–5.1)
Sodium: 140 mmol/L (ref 135–145)

## 2024-04-06 LAB — LIPID PANEL
Cholesterol: 256 mg/dL — ABNORMAL HIGH (ref 0–200)
HDL: 49 mg/dL (ref >40)
LDL Cholesterol: 179 mg/dL — ABNORMAL HIGH (ref 0–99)
Total CHOL/HDL Ratio: 5.2 ratio
Triglycerides: 140 mg/dL (ref <150)
VLDL: 28 mg/dL (ref 0–40)

## 2024-04-06 MED ORDER — HYDRALAZINE HCL 50 MG PO TABS
50.0000 mg | ORAL_TABLET | Freq: Three times a day (TID) | ORAL | 3 refills | Status: AC
Start: 2024-04-06 — End: ?
  Filled 2024-04-06: qty 270, 90d supply, fill #0

## 2024-04-06 NOTE — Telephone Encounter (Signed)
 Called patient per Harlene Gainer, NP with following lab results and instructions:  LDL too high, goal < 55. Please refer back to Lipid Clinic to get back on Repatha . Other labs stable.  Patient verbalized understanding of same. She has 10 refills left at home and will start back on same. Referral sent with note about patient's home supply.

## 2024-04-06 NOTE — Patient Instructions (Addendum)
 Increase Hydralazine  to 50 mg THREE TIMES DAILY - new Rx sent. Labs today - will call you if abnormal. Renal artery duplex has been ordered for you - we will obtain prior authorization and call you to schedule. Referral sent to our Hypertension Clinic - they should call you with your first appointment. If you don't hear from them, you may contact them at number below. Check your blood pressure daily and keep a log. Bring that with you to any future clinic visits. Return to see Dr. Cherrie in 6 months. PLEASE CALL [phone] IN DECEMBER TO SCHEDULE THIS APPOINTMENT. Please call us  at [phone] if any questions or concerns prior to your next visit.

## 2024-04-06 NOTE — Research (Signed)
 SITE: [id number]     Subject # [id number]   Subprotocol: A  Inclusion Criteria  Patients who meet all of the following criteria are eligible for enrollment as study participants:  Yes No  Age > 48 years old X   Eligible to wear Holter Study X    Exclusion Criteria  Patients who meet any of these criteria are not eligible for enrollment as study participants: Yes No  1. Receiving any mechanical (respiratory or circulatory) or renal support therapy at Screening or during Visit #1.  X  2.  Any other conditions that in the opinion of the investigators are likely to prevent compliance with the study protocol or pose a safety concern if the subject participates in the study.  X  3. Poor tolerance, namely susceptible to severe skin allergies from ECG adhesive patch application.  X   Protocol: REV H    60 minute start window         Cor device must be applied, and the study initiated, no later than 60 minutes of completing the Echocardiogram                             HH:MM  Echo completion time  09:32  2.   Cor Study start time  09:46   30-Minute execution window  Once Cor Monitoring begins, 3 QT Med ECGs and the 15-minute rest period must be completed within a 30 minute window     HH:MM  3. QT Med ECG Completion time  10:41  4. Start of 15-Min sitting rest period  09:52  5. End of 15-Min rest period  10:06  6. Time of device removal  10:43   *Continue to use the Mobile App Event feature to log the Rest period windows and follow instructions on the EF-ACT Clinical Trial  Patient Instruction Card.  Describe any anomalies in Protocol execution in the Protocol Deviation Log    Residential Zip code [id number] (First 3 digits ONLY)                                           [facility] Informed Consent   Subject Name: Catherine Espinoza  Subject met inclusion and exclusion criteria.  The informed consent form, study requirements and expectations were reviewed with the subject. Subject had opportunity to  read consent and questions and concerns were addressed prior to the signing of the consent form.  The subject verbalized understanding of the trial requirements.  The subject agreed to participate in the [facility] EF ACT trial and signed the informed consent at 09:39 on 06-Apr-2024.  The informed consent was obtained prior to performance of any protocol-specific procedures for the subject.  A copy of the signed informed consent was given to the subject and a copy was placed in the subject's medical record.   Dorthea LITTIE Louder          Current Outpatient Medications:    acetaminophen  (TYLENOL ) 500 MG tablet, Take 1,000 mg by mouth every 6 (six) hours as needed for mild pain or moderate pain., Disp: , Rfl:    amLODipine  (NORVASC ) 10 MG tablet, Take 1 tablet (10 mg total) by mouth daily., Disp: 90 tablet, Rfl: 0   aspirin  EC 81 MG tablet, Take 81 mg by mouth daily. Swallow whole., Disp: , Rfl:    blood glucose  meter kit and supplies KIT, Dispense based on patient and insurance preference. Use up to four times daily as directed. (FOR ICD-9 250.00, 250.01)., Disp: 1 each, Rfl: 0   carvedilol  (COREG ) 25 MG tablet, Take 1 tablet (25 mg total) by mouth 2 (two) times daily with a meal., Disp: 60 tablet, Rfl: 5   clindamycin  (CLEOCIN -T) 1 % lotion, Apply 1 application to affected areas daily., Disp: 60 mL, Rfl: 2   empagliflozin  (JARDIANCE ) 25 MG TABS tablet, Take 1 tablet (25 mg total) by mouth daily before breakfast., Disp: 90 tablet, Rfl: 3   Evolocumab  (REPATHA  SURECLICK) 140 MG/ML SOAJ, Inject 140 mg into the skin every 14 (fourteen) days. (Patient not taking: Reported on 04/06/2024), Disp: 2 mL, Rfl: 11   furosemide  (LASIX ) 40 MG tablet, Take 1 tablet (40 mg total) by mouth daily., Disp: 90 tablet, Rfl: 3   hydrALAZINE  (APRESOLINE ) 50 MG tablet, Take 1 tablet (50 mg total) by mouth 3 (three) times daily., Disp: 270 tablet, Rfl: 3   Insulin  Pen Needle 32G X 6 MM MISC, Use as directed once daily in the  afternoon, Disp: 100 each, Rfl: 3   Insulin  Syringe 27G X 1/2 0.5 ML MISC, Use as directed, Disp: 100 each, Rfl: 3   omeprazole  (PRILOSEC) 20 MG capsule, Take 1 capsule (20 mg total) by mouth daily., Disp: 90 capsule, Rfl: 3   rosuvastatin  (CRESTOR ) 40 MG tablet, Take 1 tablet (40 mg total) by mouth daily., Disp: 90 tablet, Rfl: 3   sacubitril -valsartan  (ENTRESTO ) 97-103 MG, Take 1 tablet by mouth 2 (two) times daily., Disp: 60 tablet, Rfl: 11   Semaglutide , 2 MG/DOSE, 8 MG/3ML SOPN, Inject 2 mg as directed once a week., Disp: 9 mL, Rfl: 3   spironolactone  (ALDACTONE ) 25 MG tablet, TAKE 1 TABLET (25 MG TOTAL) BY MOUTH DAILY., Disp: 90 tablet, Rfl: 3

## 2024-04-07 ENCOUNTER — Other Ambulatory Visit (HOSPITAL_BASED_OUTPATIENT_CLINIC_OR_DEPARTMENT_OTHER): Payer: Self-pay

## 2024-04-08 ENCOUNTER — Other Ambulatory Visit (HOSPITAL_BASED_OUTPATIENT_CLINIC_OR_DEPARTMENT_OTHER): Payer: Self-pay

## 2024-04-08 ENCOUNTER — Other Ambulatory Visit (HOSPITAL_COMMUNITY): Payer: Self-pay

## 2024-04-08 ENCOUNTER — Telehealth (HOSPITAL_COMMUNITY): Payer: Self-pay

## 2024-04-09 ENCOUNTER — Other Ambulatory Visit (HOSPITAL_BASED_OUTPATIENT_CLINIC_OR_DEPARTMENT_OTHER): Payer: Self-pay

## 2024-04-12 ENCOUNTER — Other Ambulatory Visit (HOSPITAL_BASED_OUTPATIENT_CLINIC_OR_DEPARTMENT_OTHER): Payer: Self-pay

## 2024-04-12 NOTE — Telephone Encounter (Signed)
 Advanced Heart Failure Patient Advocate Encounter  Prior auth key [id number] submitted for Repatha  was denied, as chart notes did not indicate that the patient is following a heart healthy diet.  I have reached out to the provider to update chart notes accordingly, and will resubmit for authorization.  Rachel DEL, CPhT Rx Patient Advocate Phone: [phone]

## 2024-04-12 NOTE — Addendum Note (Signed)
 Encounter addended by: Glena Harlene HERO, FNP on: 04/12/2024 3:35 PM  Actions taken: Clinical Note Signed

## 2024-04-13 ENCOUNTER — Ambulatory Visit: Attending: Cardiology | Admitting: Pharmacist Clinician (PhC)/ Clinical Pharmacy Specialist

## 2024-04-13 ENCOUNTER — Other Ambulatory Visit (HOSPITAL_COMMUNITY): Payer: Self-pay

## 2024-04-13 ENCOUNTER — Other Ambulatory Visit (HOSPITAL_BASED_OUTPATIENT_CLINIC_OR_DEPARTMENT_OTHER): Payer: Self-pay

## 2024-04-13 VITALS — BP 162/96

## 2024-04-13 DIAGNOSIS — E785 Hyperlipidemia, unspecified: Secondary | ICD-10-CM

## 2024-04-13 DIAGNOSIS — I10 Essential (primary) hypertension: Secondary | ICD-10-CM

## 2024-04-13 MED ORDER — REPATHA SURECLICK 140 MG/ML ~~LOC~~ SOAJ: 140.0000 mg | SUBCUTANEOUS | 0 refills | Status: DC

## 2024-04-13 MED ORDER — SPIRONOLACTONE 50 MG PO TABS
50.0000 mg | ORAL_TABLET | Freq: Every day | ORAL | 3 refills | Status: DC
  Filled 2024-04-13: qty 90, 90d supply, fill #0

## 2024-04-13 NOTE — Telephone Encounter (Signed)
 Prior authorization for Repatha  has been submitted and approved. Test billing returns $15 for 28 day supply.  Key: [id number] Effective: 04/13/2024 to 07/14/2024  Rachel DEL, CPhT Rx Patient Advocate Phone: [phone]

## 2024-04-13 NOTE — Patient Instructions (Signed)
 Your Results:             Your most recent labs Goal  Total Cholesterol 256 < 200  Triglycerides 140 < 150  HDL (happy/good cholesterol) 49 > 40  LDL (lousy/bad cholesterol 179 < 55   Medication changes:  We will start the process to get Repatha  covered by your insurance.  Once the prior authorization is complete, I will call/send a MyChart message to let you know and confirm pharmacy information.   You will take one injection every 14 days.    Lab orders:  We want to repeat labs after 2-3 months.  We will send you a lab order to remind you once we get closer to that time.   ______________________________________________  Follow up appointment: with Caitlin in September  Go to the lab in 2 weeks to check to check potassium   Take your BP meds as follows:  Increase spironolactone  to 50 mg once daily in the mornings  Increase hydralazine  to three times per day  Continue with all your other medications,  Arm circumference is 17.5 or 44 cm - make sure your cuff is in this range  Check your blood pressure at home daily and keep record of the readings.  To check your pressure at home you will need to:  1. Sit up in a chair, with feet flat on the floor and back supported. Do not cross your ankles or legs. 2. Rest your left arm so that the cuff is about heart level. If the cuff goes on your upper arm,  then just relax the arm on the table, arm of the chair or your lap. If you have a wrist cuff, we  suggest relaxing your wrist against your chest (think of it as Pledging the Flag with the  wrong arm).  3. Place the cuff snugly around your arm, about 1 inch above the crook of your elbow. The  cords should be inside the groove of your elbow.  4. Sit quietly, with the cuff in place, for about 5 minutes. After that 5 minutes press the power  button to start a reading. 5. Do not talk or move while the reading is taking place.  6. Record your readings on a sheet of paper. Although most cuffs  have a memory, it is often  easier to see a pattern developing when the numbers are all in front of you.  7. You can repeat the reading after 1-3 minutes if it is recommended  Make sure your bladder is empty and you have not had caffeine or tobacco within the last 30 min  Always bring your blood pressure log with you to your appointments. If you have not brought your monitor in to be double checked for accuracy, please bring it to your next appointment.  You can find a list of quality blood pressure cuffs at [email or web address]  Important lifestyle changes to control high blood pressure  Intervention  Effect on the BP  Lose extra pounds and watch your waistline Weight loss is one of the most effective lifestyle changes for controlling blood pressure. If you're overweight or obese, losing even a small amount of weight can help reduce blood pressure. Blood pressure might go down by about 1 millimeter of mercury (mm Hg) with each kilogram (about 2.2 pounds) of weight lost.  Exercise regularly As a general goal, aim for at least 30 minutes of moderate physical activity every day. Regular physical activity can lower high blood pressure by about  5 to 8 mm Hg.  Eat a healthy diet Eating a diet rich in whole grains, fruits, vegetables, and low-fat dairy products and low in saturated fat and cholesterol. A healthy diet can lower high blood pressure by up to 11 mm Hg.  Reduce salt (sodium) in your diet Even a small reduction of sodium in the diet can improve heart health and reduce high blood pressure by about 5 to 6 mm Hg.  Limit alcohol One drink equals 12 ounces of beer, 5 ounces of wine, or 1.5 ounces of 80-proof liquor.  Limiting alcohol to less than one drink a day for women or two drinks a day for men can help lower blood pressure by about 4 mm Hg.   If you have any questions or concerns please use My Chart to send questions or call the office at [phone]

## 2024-04-13 NOTE — Progress Notes (Unsigned)
 Office Visit    Patient Name: Catherine Espinoza Date of Encounter: 04/16/2024  Primary Care Provider:  Daryl Setter, NP Primary Cardiologist:  None  Chief Complaint    Hypertension, Hyperlipidemia  Significant Past Medical History   CAD 1/22 LHC DES to mLAD  CHF Most recent echo 04/06/24 EF at 45-50%, on GDMT  DM2 3/25 A1c 6.4, down from 7.3 6 months prior, on Empagliflozin , semaglutide   OSA AHI 47, now on CPAP  obesity On Ozempic  for DM, has dropped from 340 to 270, weight plateaued, followed by endocrinology    Allergies  Allergen Reactions   Ibuprofen     REACTION: knots in mouth with SOB   Labetalol  Nausea Only   Aspirin  Other (See Comments)    Knots in mouth/ Higher doses     History of Present Illness    Catherine Espinoza is a 48 y.o. female patient of Dr Cherrie, in the office today for hypertension and lipid evaluation.  She is scheduled to see Reche Finder in the [facility] clinic at the end of the month.  Her BP was elevated at 164/100 at her most recent HF clinic visit, and she was told to increase hydralazine  to 50 mg tid, from 25 mg bid.  She did increase the dose to 50 mg, however was still just taking twice daily.  For her cholesterol, she continues with rosuvastatin  40 mg daily.  Had been on Repatha  at one point, but due to some confusion, the medication was stopped.  LDL on just rosuvastatin  is at 167.  Today she notes that she has trouble taking medications in the morning, as she has to be at work by [employer] am.  She has gotten better at taking them after having a BP of 200/115 when she had her echocardiogram.    Blood Pressure Goal:  130/80  Current BP Medications:  amlodipine  10 mg daily(am) carvedilol  25 mg bid, hydralazine  50 mg tid, Entresto  97/103 mg bid, spironolactone  25 mg daily   Cholesterol medications:  rosuvastatin  40 mg daily (not filled since December); evolocumab  - problems with insurance?  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)  Home BP readings: no recent checks      Accessory Clinical Findings   04/06/24 Lipid labs:  TC 256, TG 140, HDL 49, LDL 179  Lab Results  Component Value Date   CREATININE 0.77 04/06/2024   BUN 15 04/06/2024   NA 140 04/06/2024   K 3.9 04/06/2024   CL 107 04/06/2024   CO2 25 04/06/2024   Lab Results  Component Value Date   ALT 16 10/15/2023   AST 13 10/15/2023   ALKPHOS 63 10/15/2023   BILITOT 0.4 10/15/2023   Lab Results  Component Value Date   HGBA1C 6.4 (A) 12/23/2023    Home Medications    Current Outpatient Medications  Medication Sig Dispense Refill   acetaminophen  (TYLENOL ) 500 MG tablet Take 1,000 mg by mouth every 6 (six) hours as needed for mild pain or moderate pain.  amLODipine  (NORVASC ) 10 MG tablet Take 1 tablet (10 mg total) by mouth daily. 90 tablet 0   aspirin  EC 81 MG tablet Take 81 mg by mouth daily. Swallow whole.     blood glucose meter kit and supplies KIT Dispense based on patient and insurance preference. Use up to four times daily as directed. (FOR ICD-9 250.00, 250.01). 1 each 0   carvedilol  (COREG ) 25 MG tablet Take 1 tablet (25 mg total) by mouth 2 (two) times daily with a meal. 60 tablet 5   empagliflozin  (JARDIANCE ) 25 MG TABS tablet Take 1 tablet (25 mg total) by mouth daily before breakfast. 90 tablet 3   Evolocumab  (REPATHA  SURECLICK) 140 MG/ML SOAJ Inject 140 mg into the skin every 14 (fourteen) days. 2 mL 11   Evolocumab  (REPATHA  SURECLICK) 140 MG/ML SOAJ Inject 140 mg into the skin every 14 (fourteen) days. 1 mL 0   furosemide  (LASIX ) 40 MG tablet  Take 1 tablet (40 mg total) by mouth daily. (Patient taking differently: Take 40 mg by mouth daily as needed for fluid.) 90 tablet 3   hydrALAZINE  (APRESOLINE ) 50 MG tablet Take 1 tablet (50 mg total) by mouth 3 (three) times daily. 270 tablet 3   Insulin  Pen Needle 32G X 6 MM MISC Use as directed once daily in the afternoon 100 each 3   omeprazole  (PRILOSEC) 20 MG capsule Take 1 capsule (20 mg total) by mouth daily. 90 capsule 3   rosuvastatin  (CRESTOR ) 40 MG tablet Take 1 tablet (40 mg total) by mouth daily. 90 tablet 3   sacubitril -valsartan  (ENTRESTO ) 97-103 MG Take 1 tablet by mouth 2 (two) times daily. 60 tablet 11   Semaglutide , 2 MG/DOSE, 8 MG/3ML SOPN Inject 2 mg as directed once a week. 9 mL 3   spironolactone  (ALDACTONE ) 50 MG tablet Take 1 tablet (50 mg total) by mouth daily. 90 tablet 3   No current facility-administered medications for this visit.      Assessment & Plan    Hyperlipidemia Assessment: Patient with ASCVD not at LDL goal of < 55 Most recent LDL 179 on 04/06/24 Has not been compliant until this past week with high intensity statin: atorvastatin  40 mg Reviewed options for lowering LDL cholesterol, including ezetimibe, PCSK-9 inhibitors, bempedoic acid and inclisiran.  Discussed mechanisms of action, dosing, side effects, potential decreases in LDL cholesterol and costs.  Also reviewed potential options for patient assistance.  Plan: Patient agreeable to starting Repatha  140 mg q14d Repeat labs after:  3 months Lipid Liver function    Hypertension Assessment: BP is uncontrolled in office BP 162/96 mmHg;  above the goal (<130/80). Has upcoming appointment with Reche Finder NP in the [facility] at [facility] amlodipine , carvedilol , hydralazine , Entresto  and spironolactone   well, without any side effects Denies SOB, palpitation, chest pain, headaches,or swelling Reiterated the importance of regular exercise and low salt diet   Plan:  Increase  spironolactone  to 50 mg daily and increase hydralazine  to 50 mg three times daily Continue taking amlodipine  10 mg daily(am) carvedilol  25 mg bid, Entresto  97/103 mg bid, Patient to keep record of BP readings with heart rate and report to us  at the next visit Patient to follow up with Caitlin later this month  Labs ordered today:  BMET in 10-14 days   Allean Mink PharmD CPP [facility]  [address] [place], [place] [address] [phone]

## 2024-04-16 ENCOUNTER — Encounter: Payer: Self-pay | Admitting: Pharmacist Clinician (PhC)/ Clinical Pharmacy Specialist

## 2024-04-16 ENCOUNTER — Other Ambulatory Visit (HOSPITAL_BASED_OUTPATIENT_CLINIC_OR_DEPARTMENT_OTHER): Payer: Self-pay

## 2024-04-16 DIAGNOSIS — E785 Hyperlipidemia, unspecified: Secondary | ICD-10-CM

## 2024-04-16 MED ORDER — REPATHA SURECLICK 140 MG/ML ~~LOC~~ SOAJ
140.0000 mg | SUBCUTANEOUS | 3 refills | Status: DC
  Filled 2024-04-16: qty 6, 84d supply, fill #0
  Filled 2024-05-07: qty 2, 28d supply, fill #0
  Filled 2024-05-30: qty 2, 28d supply, fill #1
  Filled 2024-07-22: qty 2, 28d supply, fill #2

## 2024-04-16 NOTE — Assessment & Plan Note (Signed)
 Assessment: BP is uncontrolled in office BP 162/96 mmHg;  above the goal (<130/80). Has upcoming appointment with Reche Finder NP in the [facility] at [facility] amlodipine , carvedilol , hydralazine , Entresto  and spironolactone   well, without any side effects Denies SOB, palpitation, chest pain, headaches,or swelling Reiterated the importance of regular exercise and low salt diet   Plan:  Increase spironolactone  to 50 mg daily and increase hydralazine  to 50 mg three times daily Continue taking amlodipine  10 mg daily(am) carvedilol  25 mg bid, Entresto  97/103 mg bid, Patient to keep record of BP readings with heart rate and report to us  at the next visit Patient to follow up with Caitlin later this month  Labs ordered today:  BMET in 10-14 days

## 2024-04-16 NOTE — Addendum Note (Signed)
 Addended by: Ammarie Matsuura L on: 04/16/2024 10:20 AM   Modules accepted: Orders

## 2024-04-16 NOTE — Assessment & Plan Note (Signed)
 Assessment: Patient with ASCVD not at LDL goal of < 55 Most recent LDL 179 on 04/06/24 Has not been compliant until this past week with high intensity statin: atorvastatin  40 mg Reviewed options for lowering LDL cholesterol, including ezetimibe, PCSK-9 inhibitors, bempedoic acid and inclisiran.  Discussed mechanisms of action, dosing, side effects, potential decreases in LDL cholesterol and costs.  Also reviewed potential options for patient assistance.  Plan: Patient agreeable to starting Repatha  140 mg q14d Repeat labs after:  3 months Lipid Liver function

## 2024-04-26 ENCOUNTER — Other Ambulatory Visit (HOSPITAL_BASED_OUTPATIENT_CLINIC_OR_DEPARTMENT_OTHER): Payer: Self-pay

## 2024-04-29 ENCOUNTER — Ambulatory Visit (HOSPITAL_COMMUNITY)

## 2024-05-07 ENCOUNTER — Ambulatory Visit (HOSPITAL_COMMUNITY): Admission: RE | Admit: 2024-05-07 | Source: Ambulatory Visit

## 2024-05-07 ENCOUNTER — Other Ambulatory Visit (HOSPITAL_BASED_OUTPATIENT_CLINIC_OR_DEPARTMENT_OTHER): Payer: Self-pay

## 2024-05-21 ENCOUNTER — Ambulatory Visit (HOSPITAL_COMMUNITY)
Admission: RE | Admit: 2024-05-21 | Discharge: 2024-05-21 | Disposition: A | Discharge status: Home / Self Care | Source: Ambulatory Visit | Attending: Family Medicine | Admitting: Family Medicine

## 2024-05-21 DIAGNOSIS — I10 Essential (primary) hypertension: Secondary | ICD-10-CM | POA: Insufficient documentation

## 2024-06-23 ENCOUNTER — Other Ambulatory Visit: Payer: Self-pay

## 2024-06-23 ENCOUNTER — Other Ambulatory Visit (HOSPITAL_BASED_OUTPATIENT_CLINIC_OR_DEPARTMENT_OTHER): Payer: Self-pay

## 2024-06-23 ENCOUNTER — Ambulatory Visit: Admitting: Internal Medicine

## 2024-06-23 ENCOUNTER — Encounter: Payer: Self-pay | Admitting: Internal Medicine

## 2024-06-23 VITALS — BP 120/84 | HR 97 | Ht 65.0 in | Wt 271.4 lb

## 2024-06-23 DIAGNOSIS — R809 Proteinuria, unspecified: Secondary | ICD-10-CM

## 2024-06-23 DIAGNOSIS — E1159 Type 2 diabetes mellitus with other circulatory complications: Secondary | ICD-10-CM | POA: Diagnosis not present

## 2024-06-23 DIAGNOSIS — E1129 Type 2 diabetes mellitus with other diabetic kidney complication: Secondary | ICD-10-CM

## 2024-06-23 LAB — POCT GLYCOSYLATED HEMOGLOBIN (HGB A1C): Hemoglobin A1C: 6.2 % — AB (ref 4.0–5.6)

## 2024-06-23 LAB — POCT GLUCOSE (DEVICE FOR HOME USE): POC Glucose: 107 mg/dL — AB (ref 70–99)

## 2024-06-23 MED ORDER — TIRZEPATIDE 5 MG/0.5ML ~~LOC~~ SOAJ
5.0000 mg | SUBCUTANEOUS | 2 refills | Status: AC
  Filled 2024-06-23: qty 2, 28d supply, fill #0
  Filled 2024-08-02: qty 2, 28d supply, fill #1
  Filled 2024-09-06: qty 2, 28d supply, fill #2

## 2024-06-23 MED ORDER — FREESTYLE LIBRE 3 PLUS SENSOR MISC
1.0000 | 3 refills | Status: AC
  Filled 2024-06-23: qty 6, 84d supply, fill #0
  Filled 2024-06-24: qty 2, 30d supply, fill #0
  Filled 2024-07-22: qty 2, 30d supply, fill #1
  Filled 2024-09-06: qty 2, 30d supply, fill #2

## 2024-06-23 NOTE — Progress Notes (Signed)
 Name: Catherine Espinoza  MRN/ DOB: [id number], May 12, 1976   Age/ Sex: 48 y.o., female    PCP: Daryl Setter, NP   Reason for Endocrinology Evaluation: Type 2 Diabetes Mellitus     Date of Initial Endocrinology Visit: 12/04/2021    PATIENT IDENTIFIER: Catherine Espinoza is a 48 y.o. female with a past medical history of T2DM, HTN, CAD and CHF. The patient presented for initial endocrinology clinic visit on 12/04/2021 for consultative assistance with her diabetes management.    HPI: Catherine Espinoza was    Diagnosed with DM 2011 Prior Medications tried/Intolerance: Metformin  -GI intolerance Hemoglobin A1c has ranged from 9.4% in 2012, peaking at 14.9% in 2019.   On her initial visit , her A1c 9.0% . Continued Toujeo  , increased Jardiance  and started Rybelsus  which was switched to Ozempic  per her request   She self discontinued  Toujeo  04/2022 due to hypoglycemia , A1c 3 months later was 6.5%     I attempted to screen her for [place] with 24-hour urinary cortisol in July 2023 but she did not return the sample  SUBJECTIVE:   During the last visit (12/23/2023): A1c 6.4 %     Today (06/23/24): Catherine Espinoza is here for follow-up on diabetes management.  Freestyle libre sensor was cost prohibitive   She continues to follow-up with cardiology for CHF and dyslipidemia  Has noted diarrhea and vomiting following Ozempic  intake   She is not on a certain diet nor exercise     HOME DIABETES REGIMEN: Jardiance  25 mg daily  Ozempic  2 mg weekly       Statin: yes ACE-I/ARB: yes Prior Diabetic Education: Yes      DIABETIC COMPLICATIONS: Microvascular complications:   Denies: CKD, neuropathy, retinopathy Last eye exam: Completed 09/15/2023  Macrovascular complications:  CAD/CHF Denies:  PVD, CVA   PAST HISTORY: Past Medical History:  Past Medical History:  Diagnosis Date  . Abscess of right hand 07/13/2021  . Anemia, unspecified   . CAD (coronary artery disease)   .  Chronic left-sided thoracic back pain   . Diabetes mellitus type 2 with complications (HCC)   . Diabetic retinopathy (HCC)   . Excessive or frequent menstruation   . Morbid obesity (HCC)   . Obstructive sleep apnea 02/11/2011   Sleep study: severe OSA- rec CPAP 20cm small  full face mask  . Proteinuria   . Thoracic radiculopathy   . Unspecified essential hypertension    Past Surgical History:  Past Surgical History:  Procedure Laterality Date  . ABDOMINAL HYSTERECTOMY    . ABLATION COLPOCLESIS    . CESAREAN SECTION    . CLEFT PALATE REPAIR    . CORONARY STENT INTERVENTION N/A 10/13/2020   Procedure: CORONARY STENT INTERVENTION;  Surgeon: Verlin Lonni BIRCH, MD;  Location: [facility] INVASIVE CV LAB;  Service: Cardiovascular;  Laterality: N/A;  . cyst removal    . ECTOPIC PREGNANCY SURGERY     x 2  . I & D EXTREMITY Right 07/13/2021   Procedure: IRRIGATION AND DEBRIDEMENT OF HAND;  Surgeon: Romona Harari, MD;  Location: [facility] OR;  Service: Orthopedics;  Laterality: Right;  . OOPHORECTOMY Right 2002  . RIGHT/LEFT HEART CATH AND CORONARY ANGIOGRAPHY N/A 10/13/2020   Procedure: RIGHT/LEFT HEART CATH AND CORONARY ANGIOGRAPHY;  Surgeon: Cherrie Toribio SAUNDERS, MD;  Location: [facility] INVASIVE CV LAB;  Service: Cardiovascular;  Laterality: N/A;    Social History:  reports that she has been smoking cigarettes. She has a 5 pack-year smoking history. She has never used  smokeless tobacco. She reports current alcohol use. She reports that she does not use drugs. Family History:  Family History  Problem Relation Age of Onset  . Heart attack Maternal Aunt   . Diabetes Mother   . Cancer Father        oral cancer     HOME MEDICATIONS: Allergies as of 06/23/2024       Reactions   Ibuprofen    REACTION: knots in mouth with SOB   Labetalol  Nausea Only   Aspirin  Other (See Comments)   Knots in mouth/ Higher doses        Medication List        Accurate as of June 23, 2024  1:30 PM. If you  have any questions, ask your nurse or doctor.          acetaminophen  500 MG tablet Commonly known as: TYLENOL  Take 1,000 mg by mouth every 6 (six) hours as needed for mild pain or moderate pain.   amLODipine  10 MG tablet Commonly known as: NORVASC  Take 1 tablet (10 mg total) by mouth daily.   aspirin  EC 81 MG tablet Take 81 mg by mouth daily. Swallow whole.   blood glucose meter kit and supplies Kit Dispense based on patient and insurance preference. Use up to four times daily as directed. (FOR ICD-9 250.00, 250.01).   carvedilol  25 MG tablet Commonly known as: COREG  Take 1 tablet (25 mg total) by mouth 2 (two) times daily with a meal.   empagliflozin  25 MG Tabs tablet Commonly known as: Jardiance  Take 1 tablet (25 mg total) by mouth daily before breakfast.   Entresto  97-103 MG Generic drug: sacubitril -valsartan  Take 1 tablet by mouth 2 (two) times daily.   furosemide  40 MG tablet Commonly known as: LASIX  Take 1 tablet (40 mg total) by mouth daily. What changed:  when to take this reasons to take this   hydrALAZINE  50 MG tablet Commonly known as: APRESOLINE  Take 1 tablet (50 mg total) by mouth 3 (three) times daily.   omeprazole  20 MG capsule Commonly known as: PRILOSEC Take 1 capsule (20 mg total) by mouth daily.   Ozempic  (2 MG/DOSE) 8 MG/3ML Sopn Generic drug: Semaglutide  (2 MG/DOSE) Inject 2 mg as directed once a week.   Repatha  SureClick 140 MG/ML Soaj Generic drug: Evolocumab  Inject 140 mg into the skin every 14 (fourteen) days.   rosuvastatin  40 MG tablet Commonly known as: CRESTOR  Take 1 tablet (40 mg total) by mouth daily.   spironolactone  50 MG tablet Commonly known as: ALDACTONE  Take 1 tablet (50 mg total) by mouth daily.   TechLite Pen Needles 32G X 6 MM Misc Generic drug: Insulin  Pen Needle Use as directed once daily in the afternoon         ALLERGIES: Allergies  Allergen Reactions  . Ibuprofen     REACTION: knots in mouth with  SOB  . Labetalol  Nausea Only  . Aspirin  Other (See Comments)    Knots in mouth/ Higher doses         OBJECTIVE:   VITAL SIGNS: BP 120/84 (BP Location: Left Arm, Patient Position: Sitting, Cuff Size: Normal)   Pulse 97   Ht 5' 5 (1.651 m)   Wt 271 lb 6.4 oz (123.1 kg)   LMP 09/13/2011   SpO2 96%   BMI 45.16 kg/m    PHYSICAL EXAM:  General: Pt appears well and is in NAD  Lungs: Clear with good BS bilat  Heart: RRR   Abdomen: Soft, nontender  Extremities:  Lower extremities - No pretibial edema  Neuro: MS is good with appropriate affect, pt is alert and Ox3    DM foot exam: 06/23/2024  The skin of the feet is intact without sores or ulcerations. The pedal pulses are 2+ on right and 2+ on left. The sensation is intact to a screening 5.07, 10 gram monofilament bilaterally    DATA REVIEWED:  Lab Results  Component Value Date   HGBA1C 6.4 (A) 12/23/2023   HGBA1C 7.3 (A) 08/25/2023   HGBA1C 6.7 (A) 02/21/2023    Latest Reference Range & Units 06/23/24 14:14  Microalb, Ur mg/dL 516.4  MICROALB/CREAT RATIO <30 mg/g creat 2,121 (H)  Creatinine, Urine 20 - 275 mg/dL 771  (H): Data is abnormally high  Latest Reference Range & Units 04/06/24 11:02  Sodium 135 - 145 mmol/L 140  Potassium 3.5 - 5.1 mmol/L 3.9  Chloride 98 - 111 mmol/L 107  CO2 22 - 32 mmol/L 25  Glucose 70 - 99 mg/dL 891 (H)  BUN 6 - 20 mg/dL 15  Creatinine 9.55 - 8.99 mg/dL 9.22  Calcium  8.9 - 10.3 mg/dL 9.3  Anion gap 5 - 15  8  GFR, Estimated >60 mL/min >60    Latest Reference Range & Units 04/06/24 10:49  Total CHOL/HDL Ratio RATIO 5.2  Cholesterol 0 - 200 mg/dL 743 (H)  HDL Cholesterol >40 mg/dL 49  LDL (calc) 0 - 99 mg/dL 820 (H)  Triglycerides <150 mg/dL 859  VLDL 0 - 40 mg/dL 28  (H): Data is abnormally high    In office BG 107 Mg/DL  ASSESSMENT / PLAN / RECOMMENDATIONS:   1) Type 2 Diabetes Mellitus, Optimally controlled, With macrovascular complications - Most recent A1c of 6.2  %. Goal A1c < 7.0 %.     -A1c at goal - Patient endorsed GI side effects with Ozempic  2 mg, will switch to Mounjaro  - Patient may contact our office in 2 months to increase the dose if no side effects - Prescription for freestyle libre 3+ has been sent to the pharmacy per her request - I have emphasized the importance of low calorie/low-carb diet and regular exercise - MA/CR ratio pending  MEDICATIONS:  Continue Jardiance  25 mg daily Stop Ozempic  2 mg weekly  Start Mounjaro  5 mg weekly   EDUCATION / INSTRUCTIONS: BG monitoring instructions: Patient is instructed to check her blood sugars 3 times a week, before meals.  2) Diabetic complications:  Eye: Does not have known diabetic retinopathy.  Neuro/ Feet: Does not have known diabetic peripheral neuropathy. Renal: Patient does not have known baseline CKD. She is  on an ACEI/ARB at present.  3) Dyslipidemia/CAD :   -Per cardiology    4) Microalbuminuria :  - MA/CR ratio elevated - Patient on valsartan , SGLT2 inhibitors - Will refer to nephrology   Follow-up in 6 months  Signed electronically by: Stefano Redgie Butts, MD  [facility]  [facility] [address] [place], [place] [address] Phone: [phone] FAX: [phone]   CC: Daryl Setter, NP [address] [place] [address] Phone: [phone]  Fax: [phone]    Return to Endocrinology clinic as below: Future Appointments  Date Time Provider Department Center  06/24/2024 10:55 AM Vannie Reche RAMAN, NP [facility] [phone] [facility]

## 2024-06-23 NOTE — Patient Instructions (Signed)
-   Continue Jardiance  25 mg, 1 tablet every morning  - Switch Ozempic  to Mounjaro   5 mg weekly    HOW TO TREAT LOW BLOOD SUGARS (Blood sugar LESS THAN 70 MG/DL) Please follow the RULE OF 15 for the treatment of hypoglycemia treatment (when your (blood sugars are less than 70 mg/dL)   STEP 1: Take 15 grams of carbohydrates when your blood sugar is low, which includes:  3-4 GLUCOSE TABS  OR 3-4 OZ OF JUICE OR REGULAR SODA OR ONE TUBE OF GLUCOSE GEL    STEP 2: RECHECK blood sugar in 15 MINUTES STEP 3: If your blood sugar is still low at the 15 minute recheck --> then, go back to STEP 1 and treat AGAIN with another 15 grams of carbohydrates.

## 2024-06-24 ENCOUNTER — Other Ambulatory Visit (HOSPITAL_BASED_OUTPATIENT_CLINIC_OR_DEPARTMENT_OTHER): Payer: Self-pay

## 2024-06-24 ENCOUNTER — Telehealth: Payer: Self-pay

## 2024-06-24 ENCOUNTER — Encounter (HOSPITAL_BASED_OUTPATIENT_CLINIC_OR_DEPARTMENT_OTHER): Payer: Self-pay | Admitting: Family

## 2024-06-24 ENCOUNTER — Ambulatory Visit: Payer: Self-pay | Admitting: Internal Medicine

## 2024-06-24 ENCOUNTER — Other Ambulatory Visit (HOSPITAL_COMMUNITY): Payer: Self-pay

## 2024-06-24 ENCOUNTER — Other Ambulatory Visit: Payer: Self-pay

## 2024-06-24 ENCOUNTER — Ambulatory Visit (HOSPITAL_BASED_OUTPATIENT_CLINIC_OR_DEPARTMENT_OTHER): Admitting: Family

## 2024-06-24 VITALS — BP 166/113 | HR 95 | Resp 17 | Ht 65.0 in | Wt 270.0 lb

## 2024-06-24 DIAGNOSIS — I10 Essential (primary) hypertension: Secondary | ICD-10-CM | POA: Diagnosis not present

## 2024-06-24 DIAGNOSIS — I5022 Chronic systolic (congestive) heart failure: Secondary | ICD-10-CM

## 2024-06-24 DIAGNOSIS — R42 Dizziness and giddiness: Secondary | ICD-10-CM

## 2024-06-24 DIAGNOSIS — G4733 Obstructive sleep apnea (adult) (pediatric): Secondary | ICD-10-CM | POA: Diagnosis not present

## 2024-06-24 DIAGNOSIS — R809 Proteinuria, unspecified: Secondary | ICD-10-CM | POA: Insufficient documentation

## 2024-06-24 DIAGNOSIS — Z720 Tobacco use: Secondary | ICD-10-CM

## 2024-06-24 LAB — MICROALBUMIN / CREATININE URINE RATIO
Creatinine, Urine: 228 mg/dL (ref 20–275)
Microalb Creat Ratio: 2121 mg/g{creat} — ABNORMAL HIGH (ref <30)
Microalb, Ur: 483.5 mg/dL

## 2024-06-24 NOTE — Progress Notes (Unsigned)
 [facility] Initial Assessment:    Date:  06/25/2024   ID:  Catherine Espinoza, DOB 02/16/1976, MRN [id number]  PCP:  Daryl Setter, NP  Cardiologist:  None  Nephrologist:  Referring MD: Glena Harlene HERO, FNP   CC: Hypertension  History of Present Illness:    Catherine Espinoza is a 48 y.o. female with a hx of DM2, HTN, morbid obesity, CAD with previous LAD stent, OSA, chronic systolic heart failure here to establish care in the [facility].   Catherine Espinoza was diagnosed with hypertension many years ago. It has been difficult to control. she reports tobacco use daily. For exercise she has no formal routine. She does walk at work but this is not consistent.  She passes meds and walks pushing a med cart.  she eats at home and outside of the home and does not follow low sodium diet. She drinks one cup of coffee in the morning and then some water. She will have one can of soda over the course of her day at work.  Endorses lightheadedness after her morning medications with sensation of near-syncope. No vision changes at this time. No chest pain, no exertional dyspnea. Reports sensation of palpitations most days but does not limit her activity and are overall brief.  She independently stopped her Amlodipine  and Hydralazine  about a month ago and has not had lightheadedness since that time.   Endorses wearing her CPAP regularly.   Previous antihypertensives:   Past Medical History:  Diagnosis Date   Abscess of right hand 07/13/2021   Anemia, unspecified    CAD (coronary artery disease)    Chronic left-sided thoracic back pain    Diabetes mellitus type 2 with complications (HCC)    Diabetic retinopathy (HCC)    Excessive or frequent menstruation    Morbid obesity (HCC)    Obstructive sleep apnea 02/11/2011   Sleep study: severe OSA- rec CPAP 20cm small  full face mask   Proteinuria    Thoracic radiculopathy    Unspecified essential hypertension      Past Surgical History:  Procedure Laterality Date   ABDOMINAL HYSTERECTOMY     ABLATION COLPOCLESIS     CESAREAN SECTION     CLEFT PALATE REPAIR     CORONARY STENT INTERVENTION N/A 10/13/2020   Procedure: CORONARY STENT INTERVENTION;  Surgeon: Verlin Lonni BIRCH, MD;  Location: [facility];  Service: Cardiovascular;  Laterality: N/A;   cyst removal     ECTOPIC PREGNANCY SURGERY     x 2   I & D EXTREMITY Right 07/13/2021   Procedure: IRRIGATION AND DEBRIDEMENT OF HAND;  Surgeon: Romona Harari, MD;  Location: [facility] OR;  Service: Orthopedics;  Laterality: Right;   OOPHORECTOMY Right 2002   RIGHT/LEFT HEART CATH AND CORONARY ANGIOGRAPHY N/A 10/13/2020   Procedure: RIGHT/LEFT HEART CATH AND CORONARY ANGIOGRAPHY;  Surgeon: Cherrie Toribio SAUNDERS, MD;  Location: [facility];  Service: Cardiovascular;  Laterality: N/A;    Current Medications: Current Meds  Medication Sig   acetaminophen  (TYLENOL ) 500 MG tablet Take 1,000 mg by mouth every 6 (six) hours as needed for mild pain or moderate pain.   aspirin  EC 81 MG tablet Take 81 mg by mouth daily. Swallow whole.   carvedilol  (COREG ) 25 MG tablet Take 1 tablet (25 mg total) by mouth 2 (two) times daily with a meal.   empagliflozin  (JARDIANCE ) 25 MG TABS tablet Take 1 tablet (25 mg total) by mouth daily before breakfast.   Evolocumab  (  REPATHA  SURECLICK) 140 MG/ML SOAJ Inject 140 mg into the skin every 14 (fourteen) days.   furosemide  (LASIX ) 40 MG tablet Take 1 tablet (40 mg total) by mouth daily.   omeprazole  (PRILOSEC) 20 MG capsule Take 1 capsule (20 mg total) by mouth daily.   rosuvastatin  (CRESTOR ) 40 MG tablet Take 1 tablet (40 mg total) by mouth daily.   sacubitril -valsartan  (ENTRESTO ) 97-103 MG Take 1 tablet by mouth 2 (two) times daily.   spironolactone  (ALDACTONE ) 50 MG tablet Take 1 tablet (50 mg total) by mouth daily.   tirzepatide  (MOUNJARO ) 5 MG/0.5ML Pen Inject 5 mg into the skin once a week.     Allergies:    Ibuprofen, Labetalol , and Aspirin    Social History   Socioeconomic History   Marital status: Single    Spouse name: Not on file   Number of children: 1   Years of education: Not on file   Highest education level: Associate degree: occupational, Scientist, product/process development, or vocational program  Occupational History   Occupation: cna/med Theme park manager: [employer]  Tobacco Use   Smoking status: Every Day    Current packs/day: 0.50    Average packs/day: 0.5 packs/day for 10.0 years (5.0 ttl pk-yrs)    Types: Cigarettes   Smokeless tobacco: Never  Vaping Use   Vaping status: Never Used  Substance and Sexual Activity   Alcohol use: Yes    Alcohol/week: 0.0 standard drinks of alcohol    Comment: occasional use   Drug use: No   Sexual activity: Yes    Birth control/protection: Surgical  Other Topics Concern   Not on file  Social History Narrative   Married-filing for divorced   Daughter born 2003   Works as cna/med [employer]   Social Drivers of Health   Financial Resource Strain: High Risk (10/12/2020)   Overall Financial Resource Strain (CARDIA)    Difficulty of Paying Living Expenses: Hard  Food Insecurity: No Food Insecurity (10/12/2020)   Hunger Vital Sign    Worried About Running Out of Food in the Last Year: Never true    Ran Out of Food in the Last Year: Never true  Transportation Needs: No Transportation Needs (10/12/2020)   PRAPARE - Administrator, Civil Service (Medical): No    Lack of Transportation (Non-Medical): No  Physical Activity: Not on file  Stress: Not on file  Social Connections: Unknown (02/08/2022)   Received from [facility]   Social Network    Social Network: Not on file     Family History: The patient's family history includes Cancer in her father; Diabetes in her mother; Heart attack in her maternal aunt.  ROS:   Please see the history of present illness.     All other systems reviewed and are negative.  EKGs/Labs/Other Studies Reviewed:          Recent Labs: 10/15/2023: ALT 16 02/04/2024: B Natriuretic Peptide 160.1 04/06/2024: BUN 15; Creatinine, Ser 0.77; Potassium 3.9; Sodium 140   Recent Lipid Panel    Component Value Date/Time   CHOL 256 (H) 04/06/2024 1049   TRIG 140 04/06/2024 1049   HDL 49 04/06/2024 1049   CHOLHDL 5.2 04/06/2024 1049   VLDL 28 04/06/2024 1049   LDLCALC 179 (H) 04/06/2024 1049   LDLDIRECT 171.0 07/04/2021 1201    Physical Exam:   VS:  BP (!) 166/113 (BP Location: Right Arm, Patient Position: Sitting, Cuff Size: Large)   Pulse 95   Resp 17   Ht  5' 5 (1.651 m)   Wt 270 lb (122.5 kg)   LMP 09/13/2011   SpO2 97%   BMI 44.93 kg/m  , BMI Body mass index is 44.93 kg/m. GENERAL:  Well appearing, overweight HEENT: Pupils equal round and reactive, fundi not visualized, oral mucosa unremarkable NECK:  No jugular venous distention, waveform within normal limits, carotid upstroke brisk and symmetric, no bruits, no thyromegaly LYMPHATICS:  No cervical adenopathy LUNGS:  Clear to auscultation bilaterally HEART:  RRR.  PMI not displaced or sustained,S1 and S2 within normal limits, no S3, no S4, no clicks, no rubs, no murmurs ABD:  Flat, positive bowel sounds normal in frequency in pitch, no bruits, no rebound, no guarding, no midline pulsatile mass, no hepatomegaly, no splenomegaly EXT:  2 plus pulses throughout, no edema, no cyanosis no clubbing SKIN:  No rashes no nodules NEURO:  Cranial nerves II through XII grossly intact, motor grossly intact throughout PSYCH:  Cognitively intact, oriented to person place and time   ASSESSMENT/PLAN:    HTN - initial check by CMA BP L arm 81/52 and R arm 166/113. On my manual check BP L arm 150/100 and R arm 160/100. She independently stopped hydralazine , amlodipine  a month ago due to lightheadedness with no recurrence.  She is hesitant regarding overtreatment of her blood pressure due to feeling poorly. Monitor BP at home - MyChart message in 1 week to check  in on BP Will keep Amlodipine , Hydralazine  on hold per her preference. Consider resuming Hydralazine  at lower dose pending BP readings in one week.  Recommend aiming for 150 minutes of moderate intensity activity per week and following a heart healthy diet.   Secondary workup OSA - wearing CPAP Renal duplex 05/21/24 no RAS Due to asymmetric BP, plan for carotid duplex Labs today TSH, catecholamines, metanephrines, renin aldosterone (of note on Spironolactone , pending result may need to hold Spiro and re-collect) If secondary workup unremarkable, consider renal denervation   OSA - Severe. CPAP compliance encouraged.   HF - Euvolemic and well compensated on exam. Follows with Heart Failure Clinic.  GDMT includes carvedilol  25 mg twice daily, Jardiance  25 mg daily, Lasix  40 mg daily, hydralazine  50 mg 3 times daily, Entresto  97-103 mg twice daily, spironolactone  50 mg daily. Low sodium diet, fluid restriction <2L, and daily weights encouraged. Educated to contact our office for weight gain of 2 lbs overnight or 5 lbs in one week.   CAD / HLD, LDL goal <70 - Stable with no anginal symptoms. No indication for ischemic evaluation.  GDMT aspirin  81 mg daily, carvedilol  25 mg twice daily, Jardiance  25 mg daily, Repatha  140 mg every 14 days, rosuvastatin  40 mg daily. Recently resumed Repatha , repeat labs have been ordered for follow up by pharmacy team.   Tobacco use - Smoking 0.5 PPD. Smoking cessation encouraged. Recommend utilization of 1800QUITNOW.   Dm2 - Continue to follow with endocrinology. Recently switched from Ozempic  to Mounjaro  as felt poorly on Ozempic .   Screening for Secondary Hypertension:     Relevant Labs/Studies:    Latest Ref Rng & Units 04/06/2024   11:02 AM 02/04/2024    4:17 PM 10/15/2023    4:14 PM  Basic Labs  Sodium 135 - 145 mmol/L 140  140  137   Potassium 3.5 - 5.1 mmol/L 3.9  3.7  4.2   Creatinine 0.44 - 1.00 mg/dL 9.22  9.16  8.82        Latest Ref Rng & Units  10/12/2020  1:45 AM 03/15/2020    9:48 AM  Thyroid    TSH 0.350 - 4.500 uIU/mL 1.477  1.01                 05/21/2024    9:51 AM  Renovascular   Renal Artery US  Completed Yes      Disposition:    FU with MD/APP/PharmD in 2 months    Medication Adjustments/Labs and Tests Ordered: Current medicines are reviewed at length with the patient today.  Concerns regarding medicines are outlined above.  Orders Placed This Encounter  Procedures   Catecholamines, fractionated, plasma   Metanephrines, plasma   TSH   Aldosterone + renin activity w/ ratio   VAS US  CAROTID   No orders of the defined types were placed in this encounter.    Signed, Reche GORMAN Finder, NP  06/25/2024 2:08 PM    [facility]

## 2024-06-24 NOTE — Telephone Encounter (Signed)
 Pharmacy Patient Advocate Encounter   Received notification from CoverMyMeds that prior authorization for Mounjaro  5MG /0.5ML auto-injectors is required/requested.   Insurance verification completed.   The patient is insured through [facility] MEDICAID .   Per test claim: PA required; PA submitted to above mentioned insurance via Latent Key/confirmation #/EOC [id number] Status is pending

## 2024-06-24 NOTE — Addendum Note (Signed)
 Addended by: SAM DONELL PARAS on: 06/24/2024 12:55 PM   Modules accepted: Orders

## 2024-06-24 NOTE — Patient Instructions (Signed)
 Medication Instructions:   Your physician recommends that you continue on your current medications as directed. Please refer to the Current Medication list given to you today.    Labwork:  TODAY ON THE 3RD FLOOR [address]--TSH, Metanephrines, plasma;  Catecholamines, fractionated, plasma; AND Aldosterone + renin activity w/ ratio    Testing/Procedures:  Your physician has requested that you have a carotid duplex. This test is an ultrasound of the carotid arteries in your neck. It looks at blood flow through these arteries that supply the brain with blood. Allow one hour for this exam. There are no restrictions or special instructions.    Follow-Up: Please follow up in _2_ months in [facility] with Dr. Raford, Reche Finder, NP or Allean Mink PharmD

## 2024-06-25 ENCOUNTER — Encounter (HOSPITAL_BASED_OUTPATIENT_CLINIC_OR_DEPARTMENT_OTHER): Payer: Self-pay

## 2024-06-28 NOTE — Telephone Encounter (Signed)
 Pharmacy Patient Advocate Encounter  Received notification from [facility] MEDICAID that Prior Authorization for Mounjaro  5MG /0.5ML auto-injectors has been APPROVED from 06/24/24 to 06/24/25

## 2024-07-19 ENCOUNTER — Encounter (INDEPENDENT_AMBULATORY_CARE_PROVIDER_SITE_OTHER)

## 2024-07-19 DIAGNOSIS — R42 Dizziness and giddiness: Secondary | ICD-10-CM

## 2024-07-19 DIAGNOSIS — I10 Essential (primary) hypertension: Secondary | ICD-10-CM

## 2024-07-19 DIAGNOSIS — I2510 Atherosclerotic heart disease of native coronary artery without angina pectoris: Secondary | ICD-10-CM

## 2024-07-20 LAB — BASIC METABOLIC PANEL WITH GFR
BUN/Creatinine Ratio: 21 (ref 9–23)
BUN: 17 mg/dL (ref 6–24)
CO2: 22 mmol/L (ref 20–29)
Calcium: 9 mg/dL (ref 8.7–10.2)
Chloride: 107 mmol/L — ABNORMAL HIGH (ref 96–106)
Creatinine, Ser: 0.82 mg/dL (ref 0.57–1.00)
Glucose: 92 mg/dL (ref 70–99)
Potassium: 4.2 mmol/L (ref 3.5–5.2)
Sodium: 140 mmol/L (ref 134–144)
eGFR: 88 mL/min/1.73 (ref >59)

## 2024-07-21 ENCOUNTER — Ambulatory Visit (HOSPITAL_BASED_OUTPATIENT_CLINIC_OR_DEPARTMENT_OTHER): Payer: Self-pay | Admitting: Family

## 2024-07-22 ENCOUNTER — Ambulatory Visit: Payer: Self-pay | Admitting: Pharmacist Clinician (PhC)/ Clinical Pharmacy Specialist

## 2024-07-22 ENCOUNTER — Other Ambulatory Visit (HOSPITAL_BASED_OUTPATIENT_CLINIC_OR_DEPARTMENT_OTHER): Payer: Self-pay

## 2024-07-23 ENCOUNTER — Other Ambulatory Visit (HOSPITAL_BASED_OUTPATIENT_CLINIC_OR_DEPARTMENT_OTHER): Payer: Self-pay

## 2024-07-26 ENCOUNTER — Other Ambulatory Visit (HOSPITAL_BASED_OUTPATIENT_CLINIC_OR_DEPARTMENT_OTHER): Payer: Self-pay

## 2024-07-27 ENCOUNTER — Other Ambulatory Visit (HOSPITAL_BASED_OUTPATIENT_CLINIC_OR_DEPARTMENT_OTHER): Payer: Self-pay

## 2024-07-28 ENCOUNTER — Other Ambulatory Visit (HOSPITAL_BASED_OUTPATIENT_CLINIC_OR_DEPARTMENT_OTHER): Payer: Self-pay

## 2024-07-29 ENCOUNTER — Other Ambulatory Visit (HOSPITAL_BASED_OUTPATIENT_CLINIC_OR_DEPARTMENT_OTHER): Payer: Self-pay

## 2024-07-30 ENCOUNTER — Other Ambulatory Visit (HOSPITAL_BASED_OUTPATIENT_CLINIC_OR_DEPARTMENT_OTHER): Payer: Self-pay

## 2024-08-02 ENCOUNTER — Other Ambulatory Visit (HOSPITAL_BASED_OUTPATIENT_CLINIC_OR_DEPARTMENT_OTHER): Payer: Self-pay

## 2024-08-02 NOTE — Telephone Encounter (Signed)
 Patient needs updated PA - expired 07/14/24

## 2024-08-03 ENCOUNTER — Telehealth: Payer: Self-pay | Admitting: Pharmacy Technician

## 2024-08-03 ENCOUNTER — Other Ambulatory Visit (HOSPITAL_BASED_OUTPATIENT_CLINIC_OR_DEPARTMENT_OTHER): Payer: Self-pay

## 2024-08-03 ENCOUNTER — Other Ambulatory Visit (HOSPITAL_COMMUNITY): Payer: Self-pay

## 2024-08-03 NOTE — Telephone Encounter (Signed)
 Pharmacy Patient Advocate Encounter   Received notification from Physician's Office that prior authorization for repatha  is required/requested.   Insurance verification completed.   The patient is insured through [facility].   Per test claim: PA required; PA submitted to above mentioned insurance via Latent Key/confirmation #/EOC [id number] Status is pending

## 2024-08-04 ENCOUNTER — Other Ambulatory Visit (HOSPITAL_BASED_OUTPATIENT_CLINIC_OR_DEPARTMENT_OTHER): Payer: Self-pay

## 2024-08-04 NOTE — Telephone Encounter (Signed)
 Had to send additional information

## 2024-08-05 ENCOUNTER — Other Ambulatory Visit (HOSPITAL_COMMUNITY): Payer: Self-pay

## 2024-08-05 NOTE — Telephone Encounter (Signed)
 Pharmacy Patient Advocate Encounter  Received notification from [facility] medicaid that Prior Authorization for repatha  has been APPROVED from 08/04/24 to 08/04/25  $15.00- one month PA #/Case ID/Reference #: [id number]

## 2024-08-06 ENCOUNTER — Other Ambulatory Visit (HOSPITAL_BASED_OUTPATIENT_CLINIC_OR_DEPARTMENT_OTHER): Payer: Self-pay

## 2024-08-06 MED ORDER — REPATHA SURECLICK 140 MG/ML ~~LOC~~ SOAJ
140.0000 mg | SUBCUTANEOUS | 3 refills | Status: AC
  Filled 2024-08-06: qty 2, 28d supply, fill #0
  Filled 2024-09-06: qty 2, 28d supply, fill #1

## 2024-08-13 LAB — HEPATIC FUNCTION PANEL
ALT: 10 IU/L (ref 0–32)
AST: 9 IU/L (ref 0–40)
Albumin: 3.2 g/dL — ABNORMAL LOW (ref 3.9–4.9)
Alkaline Phosphatase: 82 IU/L (ref 41–116)
Bilirubin Total: 0.2 mg/dL (ref 0.0–1.2)
Bilirubin, Direct: <0.08 mg/dL (ref 0.00–0.40)
Total Protein: 6.4 g/dL (ref 6.0–8.5)

## 2024-08-13 LAB — LIPID PANEL W/O CHOL/HDL RATIO
Cholesterol, Total: 141 mg/dL (ref 100–199)
HDL: 43 mg/dL (ref >39)
LDL Chol Calc (NIH): 79 mg/dL (ref 0–99)
Triglycerides: 105 mg/dL (ref 0–149)
VLDL Cholesterol Cal: 19 mg/dL (ref 5–40)

## 2024-08-13 LAB — SPECIMEN STATUS REPORT

## 2024-08-31 ENCOUNTER — Other Ambulatory Visit (HOSPITAL_BASED_OUTPATIENT_CLINIC_OR_DEPARTMENT_OTHER): Payer: Self-pay

## 2024-08-31 ENCOUNTER — Ambulatory Visit: Admitting: Dermatology

## 2024-08-31 ENCOUNTER — Encounter: Payer: Self-pay | Admitting: Dermatology

## 2024-08-31 DIAGNOSIS — Z79899 Other long term (current) drug therapy: Secondary | ICD-10-CM

## 2024-08-31 DIAGNOSIS — L732 Hidradenitis suppurativa: Secondary | ICD-10-CM | POA: Diagnosis not present

## 2024-08-31 MED ORDER — MUPIROCIN 2 % EX OINT
1.0000 | TOPICAL_OINTMENT | Freq: Two times a day (BID) | CUTANEOUS | 5 refills | Status: AC
  Filled 2024-08-31: qty 22, 30d supply, fill #0

## 2024-08-31 MED ORDER — CEPHALEXIN 500 MG PO CAPS
500.0000 mg | ORAL_CAPSULE | Freq: Two times a day (BID) | ORAL | 0 refills | Status: AC
  Filled 2024-08-31: qty 20, 10d supply, fill #0

## 2024-08-31 MED ORDER — SPIRONOLACTONE 100 MG PO TABS
100.0000 mg | ORAL_TABLET | Freq: Every evening | ORAL | 5 refills | Status: AC
  Filled 2024-08-31: qty 30, 30d supply, fill #0

## 2024-08-31 NOTE — Progress Notes (Signed)
   Follow-Up Visit   Subjective  Catherine Espinoza is a 48 y.o. female established patient who presents for FOLLOW UP on the diagnoses listed below:  Patient was last evaluated on 07/10/23.   HS: Pt presents today for flares in the L axilla. She rates the pain 10/10. She stated she has been washing the areas with BPO and applying clindamycin  lotion daily. Pt stated that she started a course of Doxycyline at Thanksgiving when she felt a flare. She is wondering if the doxycycline  is not as effective as she mentioned that she previous was taking keflex .   The following portions of the chart were reviewed this encounter and updated as appropriate: medications, allergies, medical history  Review of Systems:  No other skin or systemic complaints except as noted in HPI or Assessment and Plan.  Objective  Well appearing patient in no apparent distress; mood and affect are within normal limits.   A focused examination was performed of the following areas: L axilla   Relevant exam findings are noted in the Assessment and Plan.     Assessment & Plan   HIDRADENITIS SUPPURATIVA  Chronic hidradenitis suppurativa with frequent flares, currently experiencing a severe flare with infection. Previous treatment with doxycycline  was ineffective. Keflex  has been effective in the past for resolving flares within 2-3 days. Current flare is too infected for injection. Considering spironolactone  to block hormone receptors and prevent flares. Discussed potential side effects of spironolactone , including lightheadedness and dizziness. If spironolactone  is ineffective, consider Cosentyx, an injectable medication that suppresses the immune system, requiring screening for TB and hepatitis.  - Prescribed Keflex  500 mg twice a day for 10 days. - Prescribed mupirocin  ointment for application to affected areas. - Start spironolactone  100 mg once daily with dinner. - Provided lab slip for screening for TB and  hepatitis if considering Cosentyx. - Scheduled follow-up appointment in April or May. HIDRADENITIS SUPPURATIVA   Related Medications mupirocin  ointment (BACTROBAN ) 2 % Use 1 application topically 2 (two) times daily. Apply on open areas until clear. spironolactone  (ALDACTONE ) 100 MG tablet Take 1 tablet (100 mg total) by mouth at bedtime. TAKE WITH DINNER. LONG-TERM USE OF HIGH-RISK MEDICATION   Related Procedures Acute Hep Panel & Hep B Surface Ab QuantiFERON-TB Gold Plus  Return for HS in Apr/May - ok for work note for 12/2-12/3.   Documentation: I have reviewed the above documentation for accuracy and completeness, and I agree with the above.  I, Shirron Maranda, CMA II, am acting as scribe for:  Delon Lenis, DO

## 2024-08-31 NOTE — Patient Instructions (Addendum)
 VISIT SUMMARY:  Today, you were seen for a severe flare-up of your recurrent skin condition, hidradenitis suppurativa. We discussed your current symptoms, past treatments, and potential new treatment options.  YOUR PLAN:  -HIDRADENITIS SUPPURATIVA:  Hidradenitis suppurativa is a chronic skin condition that causes painful bumps and scarring. You are currently experiencing a severe flare-up.   We have prescribed Keflex  500 mg twice a day for 10 days, as it has been effective for you in the past. Additionally, you should apply mupirocin  ointment to the affected areas.   We are starting you on spironolactone  100 mg once daily with dinner to help prevent future flares by blocking hormone receptors. Be aware of potential side effects like lightheadedness and dizziness.   If spironolactone  does not work, we may consider Cosentyx, an injectable medication that suppresses the immune system, but this will require screening for TB and hepatitis first.  INSTRUCTIONS:  Please take Keflex  500 mg twice a day for 10 days and apply mupirocin  ointment to the affected areas as directed. Start taking spironolactone  100 mg once daily with dinner. Be mindful of any side effects such as lightheadedness or dizziness. If we need to consider Cosentyx in the future, you will need to complete lab tests for TB and hepatitis. Schedule a follow-up appointment for April or May.  Important Information  Due to recent changes in healthcare laws, you may see results of your pathology and/or laboratory studies on MyChart before the doctors have had a chance to review them. We understand that in some cases there may be results that are confusing or concerning to you. Please understand that not all results are received at the same time and often the doctors may need to interpret multiple results in order to provide you with the best plan of care or course of treatment. Therefore, we ask that you please give us  2 business days to  thoroughly review all your results before contacting the office for clarification. Should we see a critical lab result, you will be contacted sooner.   If You Need Anything After Your Visit  If you have any questions or concerns for your doctor, please call our main line at [phone] If no one answers, please leave a voicemail as directed and we will return your call as soon as possible. Messages left after 4 pm will be answered the following business day.   You may also send us  a message via MyChart. We typically respond to MyChart messages within 1-2 business days.  For prescription refills, please ask your pharmacy to contact our office. Our fax number is [phone].  If you have an urgent issue when the clinic is closed that cannot wait until the next business day, you can page your doctor at the number below.    Please note that while we do our best to be available for urgent issues outside of office hours, we are not available 24/7.   If you have an urgent issue and are unable to reach us , you may choose to seek medical care at your doctor's office, retail clinic, urgent care center, or emergency room.  If you have a medical emergency, please immediately call 911 or go to the emergency department. In the event of inclement weather, please call our main line at [phone] for an update on the status of any delays or closures.  Dermatology Medication Tips: Please keep the boxes that topical medications come in in order to help keep track of the instructions about where and how  to use these. Pharmacies typically print the medication instructions only on the boxes and not directly on the medication tubes.   If your medication is too expensive, please contact our office at [phone] or send us  a message through MyChart.   We are unable to tell what your co-pay for medications will be in advance as this is different depending on your insurance coverage. However, we may be able to  find a substitute medication at lower cost or fill out paperwork to get insurance to cover a needed medication.   If a prior authorization is required to get your medication covered by your insurance company, please allow us  1-2 business days to complete this process.  Drug prices often vary depending on where the prescription is filled and some pharmacies may offer cheaper prices.  The website www.goodrx.com contains coupons for medications through different pharmacies. The prices here do not account for what the cost may be with help from insurance (it may be cheaper with your insurance), but the website can give you the price if you did not use any insurance.  - You can print the associated coupon and take it with your prescription to the pharmacy.  - You may also stop by our office during regular business hours and pick up a GoodRx coupon card.  - If you need your prescription sent electronically to a different pharmacy, notify our office through [facility] or by phone at [phone]

## 2024-09-06 ENCOUNTER — Other Ambulatory Visit (HOSPITAL_BASED_OUTPATIENT_CLINIC_OR_DEPARTMENT_OTHER): Payer: Self-pay

## 2024-09-16 ENCOUNTER — Other Ambulatory Visit (HOSPITAL_BASED_OUTPATIENT_CLINIC_OR_DEPARTMENT_OTHER): Payer: Self-pay

## 2024-09-16 ENCOUNTER — Ambulatory Visit (HOSPITAL_BASED_OUTPATIENT_CLINIC_OR_DEPARTMENT_OTHER): Admitting: Family

## 2024-09-16 VITALS — BP 178/100 | HR 100 | Ht 65.5 in | Wt 274.0 lb

## 2024-09-16 DIAGNOSIS — R9431 Abnormal electrocardiogram [ECG] [EKG]: Secondary | ICD-10-CM

## 2024-09-16 DIAGNOSIS — I10 Essential (primary) hypertension: Secondary | ICD-10-CM

## 2024-09-16 DIAGNOSIS — R0602 Shortness of breath: Secondary | ICD-10-CM | POA: Diagnosis not present

## 2024-09-16 DIAGNOSIS — I1A0 Resistant hypertension: Secondary | ICD-10-CM | POA: Diagnosis not present

## 2024-09-16 DIAGNOSIS — I5022 Chronic systolic (congestive) heart failure: Secondary | ICD-10-CM

## 2024-09-16 MED ORDER — FUROSEMIDE 40 MG PO TABS
40.0000 mg | ORAL_TABLET | Freq: Every day | ORAL | 1 refills | Status: DC
  Filled 2024-09-16: qty 90, 90d supply, fill #0

## 2024-09-16 MED ORDER — HYDRALAZINE HCL 50 MG PO TABS
50.0000 mg | ORAL_TABLET | Freq: Two times a day (BID) | ORAL | 1 refills | Status: AC
  Filled 2024-09-16: qty 180, 90d supply, fill #0

## 2024-09-16 MED ORDER — AMLODIPINE BESYLATE 10 MG PO TABS
10.0000 mg | ORAL_TABLET | Freq: Every day | ORAL | 1 refills | Status: AC
  Filled 2024-09-16: qty 90, 90d supply, fill #0

## 2024-09-16 MED ORDER — CARVEDILOL 25 MG PO TABS
25.0000 mg | ORAL_TABLET | Freq: Two times a day (BID) | ORAL | 1 refills | Status: AC
  Filled 2024-09-16: qty 180, 90d supply, fill #0

## 2024-09-16 NOTE — Progress Notes (Unsigned)
 [facility] Assessment:    Date:  09/16/2024   ID:  Deseray Daponte, DOB 1976/06/20, MRN [id number]  PCP:  Daryl Setter, NP  Cardiologist:  None  Nephrologist:  Referring MD: Daryl Setter, NP   CC: Hypertension  History of Present Illness:    Keauna Kubicki is a 48 y.o. female with a hx of DM2, HTN, morbid obesity, CAD with previous LAD stent, OSA, chronic systolic heart failure here to follow up in the [facility].   Echo 03/2024 LVEF 40-45%, LV global hypokinesis, LV mildly dilated, RVSF normal, normal PAS, mild MR.   Renal duplex 04/2024 no renal artery stenosis.   Established with [facility] 06/19/24. Diagnosed with hypertension years prior. She was using tobacco. No formal exercise routine. She walked at work pushing medications. She was not routinely following low sodium diet.She was wearing CPAP regularly.   Catecholamines, metanephrines, TSH, renin-aldo ordered but she did not have them collected. She had independently stopped Hydralazine , Amlodipine  a month prior due to lightheadedness. Carotid duplex 07/19/24 with no carotid stenosis.   Presents today for follow up. Has been taking Lasix  PRN. Increased DOE x 2 mos with distance such as walking from her car to the back of the ALF where she works. Weight up 4 lbs from clinic visit 3 months ago. She notes only taking Lasix  PRN for weight gain of 3 lbs overnight. She did take it this morning. Some mild LE edema in bilateral ankles. She sleeps elevated which she has done for many years, has not had to add any pillows. No PND, wheeze. Notes cough which is only productive early (clear fluid) in the morning. She continues to smoke 0.5 PPD.   Takes Hydralazine  periodically *** Amlodipine  in the morning  Reports BP has been at home 140-150s/80s as low as 134/82.   One can of mountain dew.    *** She works ***  6:15 am - 2:15pm  Dispense report: 09/06/24 Repatha   28 days supply 09/06/24 Mounjaro  5mg  28 days supply 08/31/24 Spironolactone  100mg  30 day supply  No recent dispense of Amlodipine , Carvedilol , Jardiance , Hydralazine , Rosuvastatin , Furosemide .   Previous antihypertensives:   Past Medical History:  Diagnosis Date   Abscess of right hand 07/13/2021   Anemia, unspecified    CAD (coronary artery disease)    Chronic left-sided thoracic back pain    Diabetes mellitus type 2 with complications (HCC)    Diabetic retinopathy (HCC)    Excessive or frequent menstruation    Morbid obesity (HCC)    Obstructive sleep apnea 02/11/2011   Sleep study: severe OSA- rec CPAP 20cm small  full face mask   Proteinuria    Thoracic radiculopathy    Unspecified essential hypertension     Past Surgical History:  Procedure Laterality Date   ABDOMINAL HYSTERECTOMY     ABLATION COLPOCLESIS     CESAREAN SECTION     CLEFT PALATE REPAIR     CORONARY STENT INTERVENTION N/A 10/13/2020   Procedure: CORONARY STENT INTERVENTION;  Surgeon: Verlin Lonni BIRCH, MD;  Location: [facility];  Service: Cardiovascular;  Laterality: N/A;   cyst removal     ECTOPIC PREGNANCY SURGERY     x 2   I & D EXTREMITY Right 07/13/2021   Procedure: IRRIGATION AND DEBRIDEMENT OF HAND;  Surgeon: Romona Harari, MD;  Location: [facility] OR;  Service: Orthopedics;  Laterality: Right;   OOPHORECTOMY Right 2002   RIGHT/LEFT HEART CATH AND CORONARY ANGIOGRAPHY N/A 10/13/2020   Procedure:  RIGHT/LEFT HEART CATH AND CORONARY ANGIOGRAPHY;  Surgeon: Cherrie Toribio SAUNDERS, MD;  Location: [facility];  Service: Cardiovascular;  Laterality: N/A;    Current Medications: Current Meds  Medication Sig   acetaminophen  (TYLENOL ) 500 MG tablet Take 1,000 mg by mouth every 6 (six) hours as needed for mild pain or moderate pain.   amLODipine  (NORVASC ) 10 MG tablet Take 1 tablet (10 mg total) by mouth daily.   aspirin  EC 81 MG tablet Take 81 mg by mouth daily. Swallow whole.   blood glucose  meter kit and supplies KIT Dispense based on patient and insurance preference. Use up to four times daily as directed. (FOR ICD-9 250.00, 250.01).   carvedilol  (COREG ) 25 MG tablet Take 1 tablet (25 mg total) by mouth 2 (two) times daily with a meal.   Continuous Glucose Sensor (FREESTYLE LIBRE 3 PLUS SENSOR) MISC Change sensor every 15 days.   empagliflozin  (JARDIANCE ) 25 MG TABS tablet Take 1 tablet (25 mg total) by mouth daily before breakfast.   Evolocumab  (REPATHA  SURECLICK) 140 MG/ML SOAJ Inject 140 mg into the skin every 14 (fourteen) days.   furosemide  (LASIX ) 40 MG tablet Take 1 tablet (40 mg total) by mouth daily. (Patient taking differently: Take 40 mg by mouth daily as needed (swelling).)   hydrALAZINE  (APRESOLINE ) 50 MG tablet Take 1 tablet (50 mg total) by mouth 3 (three) times daily.   Insulin  Pen Needle 32G X 6 MM MISC Use as directed once daily in the afternoon   mupirocin  ointment (BACTROBAN ) 2 % Use 1 application topically 2 (two) times daily. Apply on open areas until clear.   omeprazole  (PRILOSEC) 20 MG capsule Take 1 capsule (20 mg total) by mouth daily.   rosuvastatin  (CRESTOR ) 40 MG tablet Take 1 tablet (40 mg total) by mouth daily.   spironolactone  (ALDACTONE ) 100 MG tablet Take 1 tablet (100 mg total) by mouth at bedtime. TAKE WITH DINNER.   tirzepatide  (MOUNJARO ) 5 MG/0.5ML Pen Inject 5 mg into the skin once a week.     Allergies:   Ibuprofen, Labetalol , and Aspirin    Social History   Socioeconomic History   Marital status: Single    Spouse name: Not on file   Number of children: 1   Years of education: Not on file   Highest education level: Associate degree: occupational, scientist, product/process development, or vocational program  Occupational History   Occupation: cna/med Theme Park Manager: [employer]  Tobacco Use   Smoking status: Every Day    Current packs/day: 0.50    Average packs/day: 0.5 packs/day for 10.0 years (5.0 ttl pk-yrs)    Types: Cigarettes    Passive exposure:  Current   Smokeless tobacco: Never  Vaping Use   Vaping status: Never Used  Substance and Sexual Activity   Alcohol use: Yes    Alcohol/week: 0.0 standard drinks of alcohol    Comment: occasional use   Drug use: No   Sexual activity: Yes    Birth control/protection: Surgical  Other Topics Concern   Not on file  Social History Narrative   Married-filing for divorced   Daughter born 2003   Works as cna/med tech   Social Drivers of Health   Tobacco Use: High Risk (09/16/2024)   Patient History    Smoking Tobacco Use: Every Day    Smokeless Tobacco Use: Never    Passive Exposure: Current  Financial Resource Strain: Not on file  Food Insecurity: Not on file  Transportation Needs: Not on file  Physical Activity: Not on file  Stress: Not on file  Social Connections: Unknown (02/08/2022)   Received from [facility]   Social Network    Social Network: Not on file  Depression (PHQ2-9): Low Risk (03/18/2023)   Depression (PHQ2-9)    PHQ-2 Score: 0  Alcohol Screen: Not on file  Housing: Not on file  Utilities: Not on file  Health Literacy: Not on file     Family History: The patient's family history includes Cancer in her father; Diabetes in her mother; Heart attack in her maternal aunt.  ROS:   Please see the history of present illness.     All other systems reviewed and are negative.  EKGs/Labs/Other Studies Reviewed:         Recent Labs: 02/04/2024: B Natriuretic Peptide 160.1 07/19/2024: ALT 10; BUN 17; Creatinine, Ser 0.82; Potassium 4.2; Sodium 140   Recent Lipid Panel    Component Value Date/Time   CHOL 141 07/19/2024 1607   TRIG 105 07/19/2024 1607   HDL 43 07/19/2024 1607   CHOLHDL 5.2 04/06/2024 1049   VLDL 28 04/06/2024 1049   LDLCALC 79 07/19/2024 1607   LDLDIRECT 171.0 07/04/2021 1201    Physical Exam:   VS:  BP (!) 178/100 (BP Location: Right Arm, Patient Position: Sitting, Cuff Size: Large)   Pulse 100   Ht 5' 5.5 (1.664 m)   Wt 274 lb  (124.3 kg)   LMP 09/13/2011   SpO2 95%   BMI 44.90 kg/m  , BMI Body mass index is 44.9 kg/m. GENERAL:  Well appearing, overweight HEENT: Pupils equal round and reactive, fundi not visualized, oral mucosa unremarkable NECK:  No jugular venous distention, waveform within normal limits, carotid upstroke brisk and symmetric, no bruits, no thyromegaly LYMPHATICS:  No cervical adenopathy LUNGS:  Clear to auscultation bilaterally HEART:  RRR.  PMI not displaced or sustained,S1 and S2 within normal limits, no S3, no S4, no clicks, no rubs, no murmurs ABD:  Flat, positive bowel sounds normal in frequency in pitch, no bruits, no rebound, no guarding, no midline pulsatile mass, no hepatomegaly, no splenomegaly EXT:  2 plus pulses throughout, no edema, no cyanosis no clubbing SKIN:  No rashes no nodules NEURO:  Cranial nerves II through XII grossly intact, motor grossly intact throughout PSYCH:  Cognitively intact, oriented to person place and time   ASSESSMENT/PLAN:    HTN - *** Discussed to monitor BP at home at least 2 hours after medications and sitting for 5-10 minutes.  Recommend aiming for 150 minutes of moderate intensity activity per week and following a heart healthy diet.   Secondary workup OSA - wearing CPAP Renal duplex 05/21/24 no RAS Carotid duplex 07/19/24 with no carotid stenosis.  Labs today TSH, catecholamines, metanephrines, renin aldosterone (of note on Spironolactone , pending result may need to hold Spiro and re-collect) If secondary workup unremarkable, consider renal denervation   OSA - Severe. CPAP compliance encouraged.   HF -  GDMT includes carvedilol  25 mg twice daily, Jardiance  25 mg daily, Lasix  40 mg daily, hydralazine  50 mg 3 times daily, Entresto  97-103 mg twice daily, spironolactone  100 mg daily.  Low sodium diet, fluid restriction <2L, and daily weights encouraged. Educated to contact our office for weight gain of 2 lbs overnight or 5 lbs in one week.  ***  Due for Advanced HF follow up in January with Dr. Cherrie.   CAD / HLD, LDL goal <70 - ***Exertional dyspnea x 2 months, consistent with volume overload. No ischemic  evaluation recommended. *** Aspirin  81mg , Carvedilol  25mg  BID, Rosuvastatin  40mg  daily.   Tobacco use - Smoking 0.5 PPD. Smoking cessation encouraged. Recommend utilization of [id number].   DM2 - Continue to follow with endocrinology. Recently switched from Ozempic  to Mounjaro  as felt poorly on Ozempic .   Screening for Secondary Hypertension:     Relevant Labs/Studies:    Latest Ref Rng & Units 07/19/2024    4:07 PM 04/06/2024   11:02 AM 02/04/2024    4:17 PM  Basic Labs  Sodium 134 - 144 mmol/L 140  140  140   Potassium 3.5 - 5.2 mmol/L 4.2  3.9  3.7   Creatinine 0.57 - 1.00 mg/dL 9.17  9.22  9.16        Latest Ref Rng & Units 10/12/2020    1:45 AM 03/15/2020    9:48 AM  Thyroid    TSH 0.350 - 4.500 uIU/mL 1.477  1.01                 05/21/2024    9:51 AM  Renovascular   Renal Artery US  Completed Yes      Disposition:    FU with MD/APP/PharmD in *** months    Medication Adjustments/Labs and Tests Ordered: Current medicines are reviewed at length with the patient today.  Concerns regarding medicines are outlined above.  No orders of the defined types were placed in this encounter.  No orders of the defined types were placed in this encounter.    Signed, Reche GORMAN Finder, NP  09/16/2024 2:38 PM    [facility]

## 2024-09-16 NOTE — Patient Instructions (Addendum)
 Medication Instructions:   RESUME Furosemide  (Lasix ) 40mg  daily Okay to take at 2:15pm after you get off work as it will mostly be out of your system by 8:15pm  *If you need a refill on your cardiac medications before your next appointment, please call your pharmacy*  Lab Work: Your physician recommends that you return for lab work today If you have labs (blood work) drawn today and your tests are completely normal, you will receive your results only by: MyChart Message (if you have MyChart) OR A paper copy in the mail If you have any lab test that is abnormal or we need to change your treatment, we will call you to review the results.  Testing/Procedures: EKG showed sinus tachycardia which is a normal but slightly fast heart rate.   Follow-Up: At [facility], you and your health needs are our priority.  As part of our continuing mission to provide you with exceptional heart care, our providers are all part of one team.  This team includes your primary Cardiologist (physician) and Advanced Practice Providers or APPs (Physician Assistants and Nurse Practitioners) who all work together to provide you with the care you need, when you need it.  Your next appointment:   Return to see Dr. Cherrie in January: PLEASE CALL [phone] TO SCHEDULE THIS APPOINTMENT.   AND  In 2 months with Advanced Hypertension Clinic. You may see Annabella Scarce, MD, Reche Finder, NP, or Kristin Alvstad, PharmD    We recommend signing up for the patient portal called MyChart.  Sign up information is provided on this After Visit Summary.  MyChart is used to connect with patients for Virtual Visits (Telemedicine).  Patients are able to view lab/test results, encounter notes, upcoming appointments, etc.  Non-urgent messages can be sent to your provider as well.   To learn more about what you can do with MyChart, go to [email or web address].   Other Instructions  Recommend low sodium diet - aim  for things that have less than 15% of your daily sodium intake.  Aim for less than 64 oz (2 liters) of fluid intake per day

## 2024-09-18 ENCOUNTER — Encounter (HOSPITAL_BASED_OUTPATIENT_CLINIC_OR_DEPARTMENT_OTHER): Payer: Self-pay | Admitting: Family

## 2024-09-21 ENCOUNTER — Other Ambulatory Visit (INDEPENDENT_AMBULATORY_CARE_PROVIDER_SITE_OTHER)

## 2024-09-21 ENCOUNTER — Encounter: Payer: Self-pay | Admitting: Family

## 2024-09-21 DIAGNOSIS — Z111 Encounter for screening for respiratory tuberculosis: Secondary | ICD-10-CM | POA: Diagnosis not present

## 2024-09-24 LAB — CATECHOLAMINES, FRACTIONATED, PLASMA
Dopamine: 14.7 pg/mL (ref 0.0–36.7)
Epinephrine: 14.6 pg/mL (ref 0.0–55.4)
Norepinephrine: 556 pg/mL — ABNORMAL HIGH (ref 115–524)

## 2024-09-24 LAB — ALDOSTERONE + RENIN ACTIVITY W/ RATIO
Aldos/Renin Ratio: <4 (ref 0.0–30.0)
Aldosterone: <1 ng/dL (ref 0.0–30.0)
Renin Activity, Plasma: 0.25 ng/mL/h (ref 0.167–5.380)

## 2024-09-24 LAB — QUANTIFERON-TB GOLD PLUS
Mitogen-NIL: 7.69 [IU]/mL
NIL: 0.02 [IU]/mL
QuantiFERON-TB Gold Plus: NEGATIVE
TB1-NIL: <0 [IU]/mL
TB2-NIL: 0 [IU]/mL

## 2024-09-24 LAB — BASIC METABOLIC PANEL WITH GFR
BUN/Creatinine Ratio: 13 (ref 9–23)
BUN: 11 mg/dL (ref 6–24)
CO2: 22 mmol/L (ref 20–29)
Calcium: 9.3 mg/dL (ref 8.7–10.2)
Chloride: 102 mmol/L (ref 96–106)
Creatinine, Ser: 0.84 mg/dL (ref 0.57–1.00)
Glucose: 117 mg/dL — ABNORMAL HIGH (ref 70–99)
Potassium: 4.2 mmol/L (ref 3.5–5.2)
Sodium: 138 mmol/L (ref 134–144)
eGFR: 86 mL/min/1.73

## 2024-09-24 LAB — BRAIN NATRIURETIC PEPTIDE: BNP: 248.2 pg/mL — ABNORMAL HIGH (ref 0.0–100.0)

## 2024-09-24 LAB — METANEPHRINES, PLASMA
Metanephrine, Free: <25 pg/mL (ref 0.0–88.0)
Normetanephrine, Free: 123.1 pg/mL (ref 0.0–218.9)

## 2024-09-24 LAB — MAGNESIUM: Magnesium: 1.6 mg/dL (ref 1.6–2.3)

## 2024-09-24 LAB — TSH: TSH: 1.01 u[IU]/mL (ref 0.450–4.500)

## 2024-09-25 ENCOUNTER — Ambulatory Visit: Payer: Self-pay | Admitting: Family

## 2024-09-27 ENCOUNTER — Telehealth: Payer: Self-pay | Admitting: Pharmacy Technician

## 2024-09-27 ENCOUNTER — Ambulatory Visit (HOSPITAL_BASED_OUTPATIENT_CLINIC_OR_DEPARTMENT_OTHER): Payer: Self-pay | Admitting: Family

## 2024-09-27 NOTE — Telephone Encounter (Signed)
" ° ° °  We received a fax from [facility] stating it needed to be signed for the labs ordered on 07/19/24. I scanned in media underSIGNATURE FOR LABS REQ 07/19/24 "

## 2024-10-27 ENCOUNTER — Telehealth (HOSPITAL_COMMUNITY): Payer: Self-pay

## 2024-10-27 NOTE — Telephone Encounter (Signed)
 Called to confirm/remind patient of their appointment at the [facility] on 10/28/24.   Appointment:   [x] Confirmed  [] Left mess   [] No answer/No voice mail  [] VM Full/unable to leave message  [] Phone not in service  Patient reminded to bring all medications and/or complete list.  Confirmed patient has transportation. Gave directions, instructed to utilize valet parking.

## 2024-10-28 ENCOUNTER — Ambulatory Visit (HOSPITAL_COMMUNITY)
Admission: RE | Admit: 2024-10-28 | Discharge: 2024-10-28 | Disposition: A | Discharge status: Home / Self Care | Source: Ambulatory Visit | Attending: Cardiology | Admitting: Cardiology

## 2024-10-28 ENCOUNTER — Encounter (HOSPITAL_COMMUNITY): Payer: Self-pay | Admitting: Cardiology

## 2024-10-28 ENCOUNTER — Ambulatory Visit (HOSPITAL_COMMUNITY): Payer: Self-pay | Admitting: Cardiology

## 2024-10-28 ENCOUNTER — Other Ambulatory Visit (HOSPITAL_BASED_OUTPATIENT_CLINIC_OR_DEPARTMENT_OTHER): Payer: Self-pay

## 2024-10-28 VITALS — BP 166/100 | HR 78 | Wt 273.8 lb

## 2024-10-28 DIAGNOSIS — I255 Ischemic cardiomyopathy: Secondary | ICD-10-CM | POA: Insufficient documentation

## 2024-10-28 DIAGNOSIS — I5022 Chronic systolic (congestive) heart failure: Secondary | ICD-10-CM | POA: Diagnosis present

## 2024-10-28 DIAGNOSIS — I2510 Atherosclerotic heart disease of native coronary artery without angina pectoris: Secondary | ICD-10-CM | POA: Insufficient documentation

## 2024-10-28 DIAGNOSIS — Z79899 Other long term (current) drug therapy: Secondary | ICD-10-CM | POA: Diagnosis not present

## 2024-10-28 DIAGNOSIS — E785 Hyperlipidemia, unspecified: Secondary | ICD-10-CM | POA: Insufficient documentation

## 2024-10-28 DIAGNOSIS — Z7985 Long-term (current) use of injectable non-insulin antidiabetic drugs: Secondary | ICD-10-CM | POA: Diagnosis not present

## 2024-10-28 DIAGNOSIS — G4733 Obstructive sleep apnea (adult) (pediatric): Secondary | ICD-10-CM | POA: Insufficient documentation

## 2024-10-28 DIAGNOSIS — E119 Type 2 diabetes mellitus without complications: Secondary | ICD-10-CM | POA: Insufficient documentation

## 2024-10-28 DIAGNOSIS — Z7982 Long term (current) use of aspirin: Secondary | ICD-10-CM | POA: Insufficient documentation

## 2024-10-28 DIAGNOSIS — Z6841 Body Mass Index (BMI) 40.0 and over, adult: Secondary | ICD-10-CM | POA: Insufficient documentation

## 2024-10-28 DIAGNOSIS — F17210 Nicotine dependence, cigarettes, uncomplicated: Secondary | ICD-10-CM | POA: Insufficient documentation

## 2024-10-28 DIAGNOSIS — I428 Other cardiomyopathies: Secondary | ICD-10-CM | POA: Diagnosis not present

## 2024-10-28 DIAGNOSIS — Z955 Presence of coronary angioplasty implant and graft: Secondary | ICD-10-CM | POA: Insufficient documentation

## 2024-10-28 DIAGNOSIS — Z7984 Long term (current) use of oral hypoglycemic drugs: Secondary | ICD-10-CM | POA: Insufficient documentation

## 2024-10-28 DIAGNOSIS — I110 Hypertensive heart disease with heart failure: Secondary | ICD-10-CM | POA: Diagnosis not present

## 2024-10-28 LAB — BASIC METABOLIC PANEL WITH GFR
Anion gap: 9 (ref 5–15)
BUN: 14 mg/dL (ref 6–20)
CO2: 23 mmol/L (ref 22–32)
Calcium: 9.1 mg/dL (ref 8.9–10.3)
Chloride: 104 mmol/L (ref 98–111)
Creatinine, Ser: 0.79 mg/dL (ref 0.44–1.00)
GFR, Estimated: >60 mL/min
Glucose, Bld: 169 mg/dL — ABNORMAL HIGH (ref 70–99)
Potassium: 3.9 mmol/L (ref 3.5–5.1)
Sodium: 136 mmol/L (ref 135–145)

## 2024-10-28 LAB — PRO BRAIN NATRIURETIC PEPTIDE: Pro Brain Natriuretic Peptide: 2371 pg/mL — ABNORMAL HIGH

## 2024-10-28 MED ORDER — SACUBITRIL-VALSARTAN 97-103 MG PO TABS
1.0000 | ORAL_TABLET | Freq: Two times a day (BID) | ORAL | 11 refills | Status: AC
  Filled 2024-10-28: qty 60, 30d supply, fill #0

## 2024-10-28 MED ORDER — FUROSEMIDE 20 MG PO TABS
60.0000 mg | ORAL_TABLET | Freq: Every day | ORAL | 3 refills | Status: AC
  Filled 2024-10-28: qty 180, 60d supply, fill #0

## 2024-10-28 NOTE — Telephone Encounter (Addendum)
 Pt aware, agreeable, and verbalized understanding  Med list updated labs ordered and scheduled  ----- Message from Caffie Shed, PA-C sent at 10/28/2024 12:22 PM EST ----- Fluid marker is elevated. Increase Lasix  to 60 mg daily and make sure she restarts Entresto  as directed. Repeat BMP and pro BNP in 1 wk

## 2024-10-28 NOTE — Patient Instructions (Signed)
 There has been no changes to your medications.  Labs done today, your results will be available in MyChart, we will contact you for abnormal readings.  Your physician has requested that you have an echocardiogram. Echocardiography is a painless test that uses sound waves to create images of your heart. It provides your doctor with information about the size and shape of your heart and how well your hearts chambers and valves are working. This procedure takes approximately one hour. There are no restrictions for this procedure. Please do NOT wear cologne, perfume, aftershave, or lotions (deodorant is allowed). Please arrive 15 minutes prior to your appointment time.  Please note: We ask at that you not bring children with you during ultrasound (echo/ vascular) testing. Due to room size and safety concerns, children are not allowed in the ultrasound rooms during exams. Our front office staff cannot provide observation of children in our lobby area while testing is being conducted. An adult accompanying a patient to their appointment will only be allowed in the ultrasound room at the discretion of the ultrasound technician under special circumstances. We apologize for any inconvenience.  Your physician recommends that you schedule a follow-up appointment in: 5 months with an echocardiogram ( June) ** PLEASE CALL THE OFFICE IN APRIL TO ARRANGE YOUR FOLLOW UP APPOINTMENT.**  If you have any questions or concerns before your next appointment please send us  a message through [place] or call our office at [phone].    TO LEAVE A MESSAGE FOR THE NURSE SELECT OPTION 2, PLEASE LEAVE A MESSAGE INCLUDING: YOUR NAME DATE OF BIRTH CALL BACK NUMBER REASON FOR CALL**this is important as we prioritize the call backs  YOU WILL RECEIVE A CALL BACK THE SAME DAY AS LONG AS YOU CALL BEFORE 4:00 PM  At the [facility], you and your health needs are our priority. As part of our continuing mission  to provide you with exceptional heart care, we have created designated Provider Care Teams. These Care Teams include your primary Cardiologist (physician) and Advanced Practice Providers (APPs- Physician Assistants and Nurse Practitioners) who all work together to provide you with the care you need, when you need it.   You may see any of the following providers on your designated Care Team at your next follow up: Dr Toribio Fuel Dr Ezra Shuck Dr. Morene Brownie Greig Mosses, NP Caffie Shed, [place] [facility] [place], [place] Beckey Coe, NP Jordan Lee, NP Ellouise Class, NP Tinnie Redman, PharmD Jaun Bash, PharmD   Please be sure to bring in all your medications bottles to every appointment.    Thank you for choosing [facility]

## 2024-10-28 NOTE — Progress Notes (Signed)
 "   Advanced Heart Failure Clinic Note   PCP: Daryl Setter, NP HF Cardiologist: Dr. Cherrie  Reason for visit: f/u for systolic heart failure   HPI: Catherine Espinoza is a 49 y.o. female with DM2, HTN, morbid obesity, CAD with previous LAD stent, OSA and chronic systolic HF.  Presented to ED on 10/11/20 for leg swelling. Echo EF 20-25%. R/LHC w/ severe 2 v CAD involving LAD and RCA (s/p PTCA/DES to LAD, RCA treated medically), preserved CO, elevated filling pressures.   Echo 03/12/21 EF 40-45%   Sleep study 5/23 Severe OSA with AHI 47   Echo 06/23: EF 50-55%, grade I DD, RV systolic function not well visualized, RVSP 32 mmHg  Echo 09/24: EF 45-50%, grade II DD, RV okay  Echo 7/25, EF 45-50%.  Today, she returns for f/u. Here w/ her mother. She denies resting dyspnea. Reports stable NYHA Class III symptoms, confounded by obesity and deconditioning. Denies CP. She is finding it difficult to perform her job duties. 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 She tires out from this but otherwise doing fairly ok w/ other ADLs. Her wt has remained stable around 270 lb over the last several months. Now off Ozempic  d/t side effects. Recently started Mounjaro  and tolerating this better. BP elevated  at 166/100, after running out of Entersto several weeks ago. Compliant w/ all other meds. Admits to poor compliance w/ CPAP.  Still smoking cigarettes, ~1/2 ppd.   Cardiac Studies  Echo 7/25: EF 45-50% Echo 9/24:  EF 45-50%, grade II DD, RV okay  Echo 1/22: EF 20-25% with global hypokinesis, mild LVH, grade I DD, RVSP 54, small pericardial effusion and IVC dilated w/ RAP 15.  R/LHC 1/22: RHC/LHC: PCI to LAD Prox RCA lesion is 30% stenosed. RPDA lesion is 95% stenosed. RPAV lesion is 99% stenosed. Prox LAD to Mid LAD lesion is 40% stenosed. Mid LAD lesion is 80% stenosed. 2nd Diag lesion is 50% stenosed.   Findings:  Ao = 147/97 (119) LV =   140/27 RA =  12 RV = 68/15 PA = 72/37 (51) PCW = 34 Fick cardiac output/index = 6.3/2.9 PVR = 2.6 WU FA sat = 97% PA sat = 72%, 72%  Assessment: 1. 2v CAD with high grade lesions in the mid LAD and distal RCA 2. 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 Markedly elevated filling pressures with normal cardiac output  Plan/Discussion: Plan PCI of LAD followed by aggressive diuresis and titration of GDMT  ROS: All systems reviewed and negative except as per HPI.   Past Medical History:  Diagnosis Date   Abscess of right hand 07/13/2021   Anemia, unspecified    CAD (coronary artery disease)    Chronic left-sided thoracic back pain    Diabetes mellitus type 2 with complications (HCC)    Diabetic retinopathy (HCC)    Excessive or frequent menstruation    Morbid obesity (HCC)    Obstructive sleep apnea 02/11/2011   Sleep study: severe OSA- rec CPAP 20cm small  full face mask   Proteinuria    Thoracic radiculopathy    Unspecified essential hypertension     Current Outpatient Medications  Medication Sig Dispense Refill   acetaminophen  (TYLENOL ) 500 MG tablet Take 1,000 mg by mouth every 6 (six) hours as needed for mild pain or moderate pain.     amLODipine  (NORVASC ) 10 MG tablet Take 1 tablet (10 mg total) by mouth daily.  90 tablet 1   aspirin  EC 81 MG tablet Take 81 mg by mouth daily. Swallow whole.     blood glucose meter kit and supplies KIT Dispense based on patient and insurance preference. Use up to four times daily as directed. (FOR ICD-9 250.00, 250.01). 1 each 0   carvedilol  (COREG ) 25 MG tablet Take 1 tablet (25 mg total) by mouth 2 (two) times daily with a meal. 180 tablet 1   Continuous Glucose Sensor (FREESTYLE LIBRE 3 PLUS SENSOR) MISC Change sensor every 15 days. 6 each 3   empagliflozin  (JARDIANCE ) 25 MG TABS tablet Take 1 tablet (25 mg total) by mouth daily before breakfast. 90 tablet 3   Evolocumab  (REPATHA  SURECLICK) 140 MG/ML SOAJ Inject 140 mg into  the skin every 14 (fourteen) days. 6 mL 3   furosemide  (LASIX ) 40 MG tablet Take 1 tablet (40 mg total) by mouth daily. 90 tablet 1   hydrALAZINE  (APRESOLINE ) 50 MG tablet Take 1 tablet (50 mg total) by mouth 2 (two) times daily. 180 tablet 1   Insulin  Pen Needle 32G X 6 MM MISC Use as directed once daily in the afternoon 100 each 3   mupirocin  ointment (BACTROBAN ) 2 % Use 1 application topically 2 (two) times daily. Apply on open areas until clear. 30 g 5   omeprazole  (PRILOSEC) 20 MG capsule Take 1 capsule (20 mg total) by mouth daily. 90 capsule 3   rosuvastatin  (CRESTOR ) 40 MG tablet Take 1 tablet (40 mg total) by mouth daily. 90 tablet 3   spironolactone  (ALDACTONE ) 100 MG tablet Take 1 tablet (100 mg total) by mouth at bedtime. TAKE WITH DINNER. 30 tablet 5   sacubitril -valsartan  (ENTRESTO ) 97-103 MG Take 1 tablet by mouth 2 (two) times daily. (Patient not taking: Reported on 10/28/2024)     tirzepatide  (MOUNJARO ) 5 MG/0.5ML Pen Inject 5 mg into the skin once a week. 6 mL 2   No current facility-administered medications for this encounter.    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: [employer]  Tobacco Use   Smoking status: Every Day    Current packs/day: 0.50    Average packs/day: 0.5 packs/day for 10.0 years (5.0 ttl pk-yrs)    Types: Cigarettes    Passive exposure: Current   Smokeless tobacco: Never  Vaping Use   Vaping status: Never Used  Substance and Sexual Activity   Alcohol use: Yes    Alcohol/week: 0.0 standard drinks of  alcohol    Comment: occasional use   Drug use: No   Sexual activity: Yes    Birth control/protection: Surgical  Other Topics Concern   Not on file  Social History Narrative   Married-filing for divorced   Daughter born 2003   Works as cna/med tech   Social Drivers of Health   Tobacco Use: High Risk (09/18/2024)   Patient History    Smoking Tobacco Use: Every Day    Smokeless Tobacco Use: Never    Passive Exposure:  Current  Physicist, Medical Strain: Not on file  Food Insecurity: Not on file  Transportation Needs: Not on file  Physical Activity: Not on file  Stress: Not on file  Social Connections: Unknown (02/08/2022)   Received from [facility]   Social Network    Social Network: Not on file  Intimate Partner Violence: Unknown (01/01/2022)   Received from [facility]   HITS    Physically Hurt: Not on file    Insult or Talk Down To: Not on file    Threaten Physical Harm: Not on file    Scream or Curse: Not on file  Depression (PHQ2-9): Low Risk (03/18/2023)   Depression (PHQ2-9)    PHQ-2 Score: 0  Alcohol Screen: Not on file  Housing: Not on file  Utilities: Not on file  Health Literacy: Not on file   Family History  Problem Relation Age of Onset   Diabetes Mother    Cancer Father        oral cancer   Heart attack Maternal Aunt     Wt Readings from Last 3 Encounters:  10/28/24 124.2 kg (273 lb 12.8 oz)  09/16/24 124.3 kg (274 lb)  06/24/24 122.5 kg (270 lb)   BP (!) 166/100   Pulse 78   Wt 124.2 kg (273 lb 12.8 oz)   LMP 09/13/2011   SpO2 94%   BMI 44.87 kg/m   PHYSICAL EXAM: GENERAL: obese, NAD Lungs- clear  CARDIAC:  thick neck, JVD not well visualized           Normal rate with regular rhythm. No MRG. No LEE  ABDOMEN: obese, soft, non-tender, non-distended.  EXTREMITIES: Warm and well perfused.  NEUROLOGIC: No obvious FND    ASSESSMENT & PLAN:  1.  Chronic Systolic Heart Failure - Mixed ischemic/nonischemic cardiomyopathy (Possibly  hypertension) - Echo 1/22 EF 20-25%, mild LVH, grade I DD - Cath 2022 with LAD lesion s/p PCI. Residual RCA disease treated medically - Echo 03/12/21 EF 40-45% - Echo 06/23: EF 50-55% - Echo 09/24: EF 45-50%, RV okay - Echo 7/25 EF 45-50%  - NYHA III, confounded by obesity and deconditioning. Volume assessment difficulty d/t body habitus but wt remains stable. Check pro BNP today to determine need for lasix  up titration. For now, will continue Lasix  40 mg daily   - Restart Entresto  97-103 mg bid. Check BMP today  - Continue hydralazine  50 mg tid (dizzy on BiDil ) - Continue Coreg  25 mg bid - Continue spiro 25 mg daily. - Continue Jardiance  25 mg daily. - Update echo in 6 months   2. CAD - S/P PCI LAD 1/22, had severe distal RCA disease treated medically - denies ischemic CP   - Continue ASA 81 mg daily  - Continue Crestor  and Repatha     3. Hypertension  - elevated today but out of Entresto   - restart Entersto 97-103 mg bid  - Continue hydralazine  50 mg tid  - Continue amlodipine  10 mg daily. - Continue Coreg  25 mg bid  - Continue Spironolactone  100 mg daily (dermatologist dose) - RA dopplers negative  - suspect untreated OSA, obesity and tobacco use all contributors. Encouraged wt loss, lifestyle/diet modification and smoking cessation   4. DM2 - Last A1c 6.4% (03/25) - On Mounjaro  and SGLT2i - Following with endocrine  5. Obesity - Body mass index is 44.87 kg/m. - just started Mounjaro . Endo managing  6. Tobacco use - Smoking 1/2 ppd, encouraged to quit  - Failed Chantix  and patches.  7.  OSA - Severe. AHI 47 - encouraged to improve compliance w/ CPAP - now on GLP1   8. HLD - on Crestor  + Repatha  - lipid clinic following  - most recent LDL 79 mg/dL   F/u w/ repeat echo in 6 months. If LV normalized/stable, consider graduation from HF clinic if OK by MD.  Caffie Shed, PA-C 10/28/24 "

## 2024-11-02 ENCOUNTER — Encounter: Payer: Self-pay | Admitting: Family

## 2024-11-02 ENCOUNTER — Ambulatory Visit: Admitting: Family

## 2024-11-04 ENCOUNTER — Other Ambulatory Visit (HOSPITAL_COMMUNITY): Payer: Self-pay

## 2024-11-04 ENCOUNTER — Ambulatory Visit (HOSPITAL_COMMUNITY)

## 2024-11-04 DIAGNOSIS — I5022 Chronic systolic (congestive) heart failure: Secondary | ICD-10-CM

## 2024-11-04 DIAGNOSIS — I509 Heart failure, unspecified: Secondary | ICD-10-CM

## 2024-11-05 ENCOUNTER — Ambulatory Visit (HOSPITAL_COMMUNITY): Payer: Self-pay | Admitting: Cardiology

## 2024-11-05 LAB — BASIC METABOLIC PANEL WITH GFR
BUN/Creatinine Ratio: 21 (ref 9–23)
BUN: 20 mg/dL (ref 6–24)
CO2: 20 mmol/L (ref 20–29)
Calcium: 9.2 mg/dL (ref 8.7–10.2)
Chloride: 105 mmol/L (ref 96–106)
Creatinine, Ser: 0.96 mg/dL (ref 0.57–1.00)
Glucose: 169 mg/dL — ABNORMAL HIGH (ref 70–99)
Potassium: 4.1 mmol/L (ref 3.5–5.2)
Sodium: 140 mmol/L (ref 134–144)
eGFR: 73 mL/min/{1.73_m2}

## 2024-11-05 LAB — PRO B NATRIURETIC PEPTIDE: NT-Pro BNP: 1183 pg/mL — ABNORMAL HIGH (ref 0–249)

## 2024-11-09 ENCOUNTER — Ambulatory Visit: Admitting: Family

## 2024-11-22 ENCOUNTER — Encounter (HOSPITAL_BASED_OUTPATIENT_CLINIC_OR_DEPARTMENT_OTHER): Admitting: Family

## 2024-12-21 ENCOUNTER — Ambulatory Visit: Admitting: Internal Medicine

## 2025-01-03 ENCOUNTER — Ambulatory Visit: Admitting: Dermatology
